# Patient Record
Sex: Female | Born: 1950 | Race: White | Hispanic: No | Smoking: Current some day smoker
Health system: Southern US, Community
[De-identification: ages and names within clinical notes are randomized; demographics above are authoritative.]

## PROBLEM LIST (undated history)

## (undated) DIAGNOSIS — Z1211 Encounter for screening for malignant neoplasm of colon: Secondary | ICD-10-CM

## (undated) DIAGNOSIS — E785 Hyperlipidemia, unspecified: Secondary | ICD-10-CM

## (undated) DIAGNOSIS — F329 Major depressive disorder, single episode, unspecified: Secondary | ICD-10-CM

## (undated) DIAGNOSIS — F419 Anxiety disorder, unspecified: Secondary | ICD-10-CM

## (undated) DIAGNOSIS — I10 Essential (primary) hypertension: Secondary | ICD-10-CM

## (undated) DIAGNOSIS — F32A Depression, unspecified: Secondary | ICD-10-CM

## (undated) DIAGNOSIS — K631 Perforation of intestine (nontraumatic): Secondary | ICD-10-CM

## (undated) HISTORY — DX: Perforation of intestine (nontraumatic): K63.1

## (undated) HISTORY — DX: Hyperlipidemia, unspecified: E78.5

## (undated) HISTORY — DX: Encounter for screening for malignant neoplasm of colon: Z12.11

## (undated) HISTORY — DX: Anxiety disorder, unspecified: F41.9

## (undated) HISTORY — PX: TUBAL LIGATION: SHX77

## (undated) HISTORY — PX: COLON SURGERY: SHX602

## (undated) HISTORY — DX: Essential (primary) hypertension: I10

---
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
## 2004-05-16 ENCOUNTER — Ambulatory Visit: Payer: Self-pay

## 2005-05-21 ENCOUNTER — Ambulatory Visit: Payer: Self-pay

## 2006-07-08 ENCOUNTER — Ambulatory Visit: Payer: Self-pay

## 2007-07-07 ENCOUNTER — Ambulatory Visit: Payer: Self-pay

## 2007-07-08 ENCOUNTER — Ambulatory Visit: Payer: Self-pay

## 2007-07-22 HISTORY — PX: BREAST BIOPSY: SHX20

## 2007-07-29 ENCOUNTER — Ambulatory Visit: Payer: Self-pay | Admitting: General Surgery

## 2008-07-08 ENCOUNTER — Emergency Department: Payer: Self-pay

## 2008-11-23 ENCOUNTER — Ambulatory Visit: Payer: Self-pay

## 2009-12-12 ENCOUNTER — Ambulatory Visit: Payer: Self-pay

## 2010-12-19 ENCOUNTER — Ambulatory Visit: Payer: Self-pay

## 2012-01-28 ENCOUNTER — Ambulatory Visit: Payer: Self-pay

## 2013-01-21 HISTORY — PX: BREAST BIOPSY: SHX20

## 2013-02-17 ENCOUNTER — Ambulatory Visit: Payer: Self-pay

## 2013-02-23 ENCOUNTER — Ambulatory Visit: Payer: Self-pay

## 2013-08-25 ENCOUNTER — Ambulatory Visit: Payer: Self-pay

## 2013-09-01 ENCOUNTER — Ambulatory Visit: Payer: Self-pay

## 2013-09-03 LAB — PATHOLOGY REPORT

## 2014-03-02 ENCOUNTER — Ambulatory Visit: Payer: Self-pay

## 2014-05-06 ENCOUNTER — Other Ambulatory Visit: Payer: Self-pay | Admitting: Oncology

## 2014-05-06 DIAGNOSIS — N63 Unspecified lump in unspecified breast: Secondary | ICD-10-CM

## 2014-05-06 DIAGNOSIS — N6320 Unspecified lump in the left breast, unspecified quadrant: Secondary | ICD-10-CM

## 2014-09-08 ENCOUNTER — Other Ambulatory Visit: Payer: Self-pay

## 2014-09-14 ENCOUNTER — Encounter: Payer: Self-pay | Admitting: *Deleted

## 2014-09-14 ENCOUNTER — Other Ambulatory Visit: Payer: Self-pay | Admitting: *Deleted

## 2014-09-14 ENCOUNTER — Ambulatory Visit
Admission: RE | Admit: 2014-09-14 | Discharge: 2014-09-14 | Disposition: A | Discharge status: Home / Self Care | Payer: Self-pay | Source: Ambulatory Visit | Attending: Oncology | Admitting: Oncology

## 2014-09-14 DIAGNOSIS — R922 Inconclusive mammogram: Secondary | ICD-10-CM

## 2014-09-14 DIAGNOSIS — N63 Unspecified lump in unspecified breast: Secondary | ICD-10-CM

## 2014-09-14 DIAGNOSIS — R928 Other abnormal and inconclusive findings on diagnostic imaging of breast: Secondary | ICD-10-CM | POA: Insufficient documentation

## 2014-09-14 DIAGNOSIS — N6320 Unspecified lump in the left breast, unspecified quadrant: Secondary | ICD-10-CM

## 2014-09-14 NOTE — Progress Notes (Signed)
Patient with birads 3 mammogram.  Called and mailed patient appointment for 6 month follow for 03/15/15 @ 12:30.

## 2015-03-15 ENCOUNTER — Ambulatory Visit: Payer: Self-pay

## 2015-04-12 ENCOUNTER — Ambulatory Visit
Admission: RE | Admit: 2015-04-12 | Discharge: 2015-04-12 | Disposition: A | Discharge status: Home / Self Care | Payer: Self-pay | Source: Ambulatory Visit | Attending: Oncology | Admitting: Oncology

## 2015-04-12 ENCOUNTER — Other Ambulatory Visit: Payer: Self-pay | Admitting: Oncology

## 2015-04-12 ENCOUNTER — Ambulatory Visit: Payer: Self-pay | Attending: Oncology

## 2015-04-12 VITALS — BP 130/86 | HR 92 | Temp 97.6°F | Ht 68.9 in | Wt 172.6 lb

## 2015-04-12 DIAGNOSIS — N63 Unspecified lump in unspecified breast: Secondary | ICD-10-CM

## 2015-04-12 DIAGNOSIS — Z0000 Encounter for general adult medical examination without abnormal findings: Secondary | ICD-10-CM

## 2015-04-12 NOTE — Progress Notes (Signed)
Subjective:     Patient ID: Marcia [name], female   DOB: [name], 65 y.o.   MRN: [id number]  HPI   Review of Systems     Objective:   Physical Exam  Pulmonary/Chest: Right breast exhibits no inverted nipple, no mass, no nipple discharge, no skin change and no tenderness. Left breast exhibits no inverted nipple, no mass, no nipple discharge, no skin change and no tenderness. Breasts are symmetrical.  Genitourinary: No labial fusion. There is no rash, tenderness, lesion or injury on the right labia. There is no rash, tenderness, lesion or injury on the left labia. Uterus is not deviated, not enlarged, not fixed and not tender. Cervix exhibits no motion tenderness, no discharge and no friability. Right adnexum displays no mass, no tenderness and no fullness. Left adnexum displays no mass, no tenderness and no fullness. No erythema, tenderness or bleeding in the vagina. No foreign body around the vagina. No signs of injury around the vagina. No vaginal discharge found.       Assessment:     65 [facility].  Patient screened, and meets [facility] eligibility.  Patient does not have insurance, Medicare or Medicaid.  Handout given on Affordable Care Act. Instructed patient on breast self-exam using teach back method.  CBE unremarkable.   Pelvic exam normal.     Plan:     Sent for bilateral diagnostic mammogram.  Specimen collected for pap.

## 2015-04-17 LAB — PAP LB AND HPV HIGH-RISK
HPV, high-risk: NEGATIVE
PAP SMEAR COMMENT: 0

## 2015-05-04 IMAGING — MG MM BREAST STEREO BIOPSY*L*
4 series · 8 of 8 positions shown · non-contrast
Comparison: Previous exams.

ADDENDUM:
Final pathology demonstrates FIBROCYSTIC AND FIBROADENOMATOID
CHANGES WITH MICROCALCIFICATIONS. SUGGESTION OF EARLY RADIAL SCAR.

Histology correlates with imaging findings.
The patient was contacted by phone on 09/03/2013 and these results
given to her which she understood. Her questions were answered.
The patient had no complaints with her biopsy site...
These results were discussed with Wayne Mazhar, RN.
Recommend surgical consultation for possible early radial scar.
CLINICAL DATA: Patient presents for stereotactic guided core biopsy
of left breast calcifications.
EXAM:
LEFT BREAST STEREOTACTIC CORE NEEDLE BIOPSY

[Series 1: L · left · 0.10mm/px · 5 of 15 slices shown]
[im 1/15]
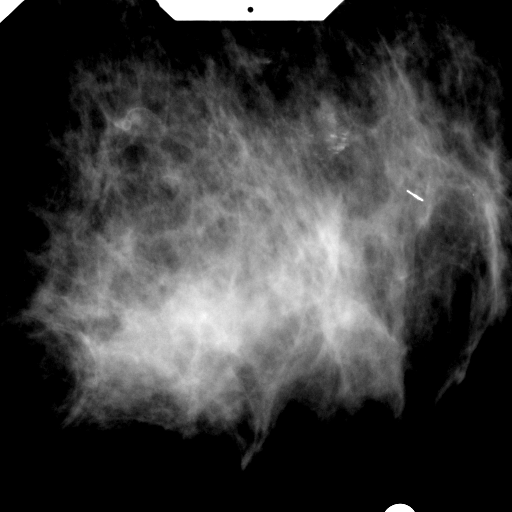
[im 4/15]
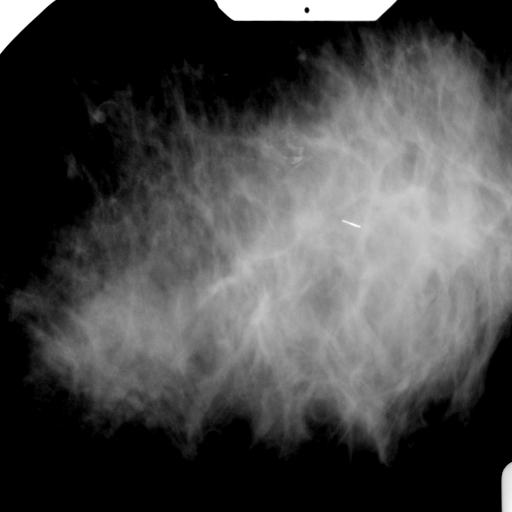
[im 8/15]
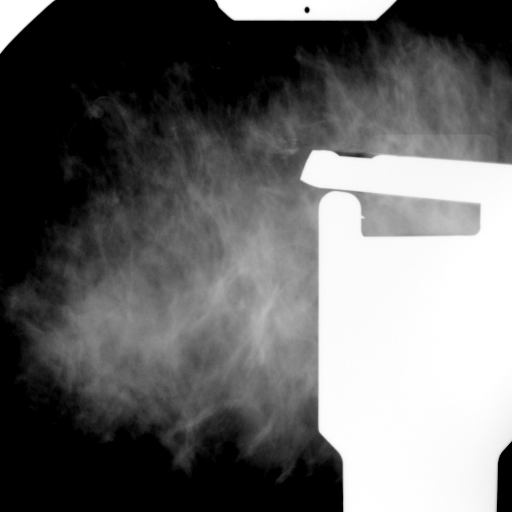
[im 11/15]
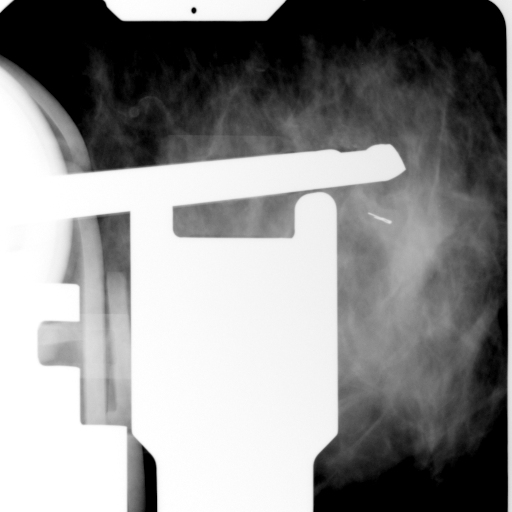
[im 15/15]
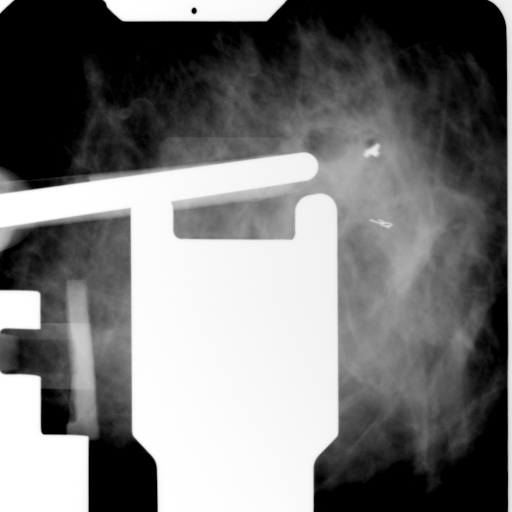

[L CC (1 of 2)]
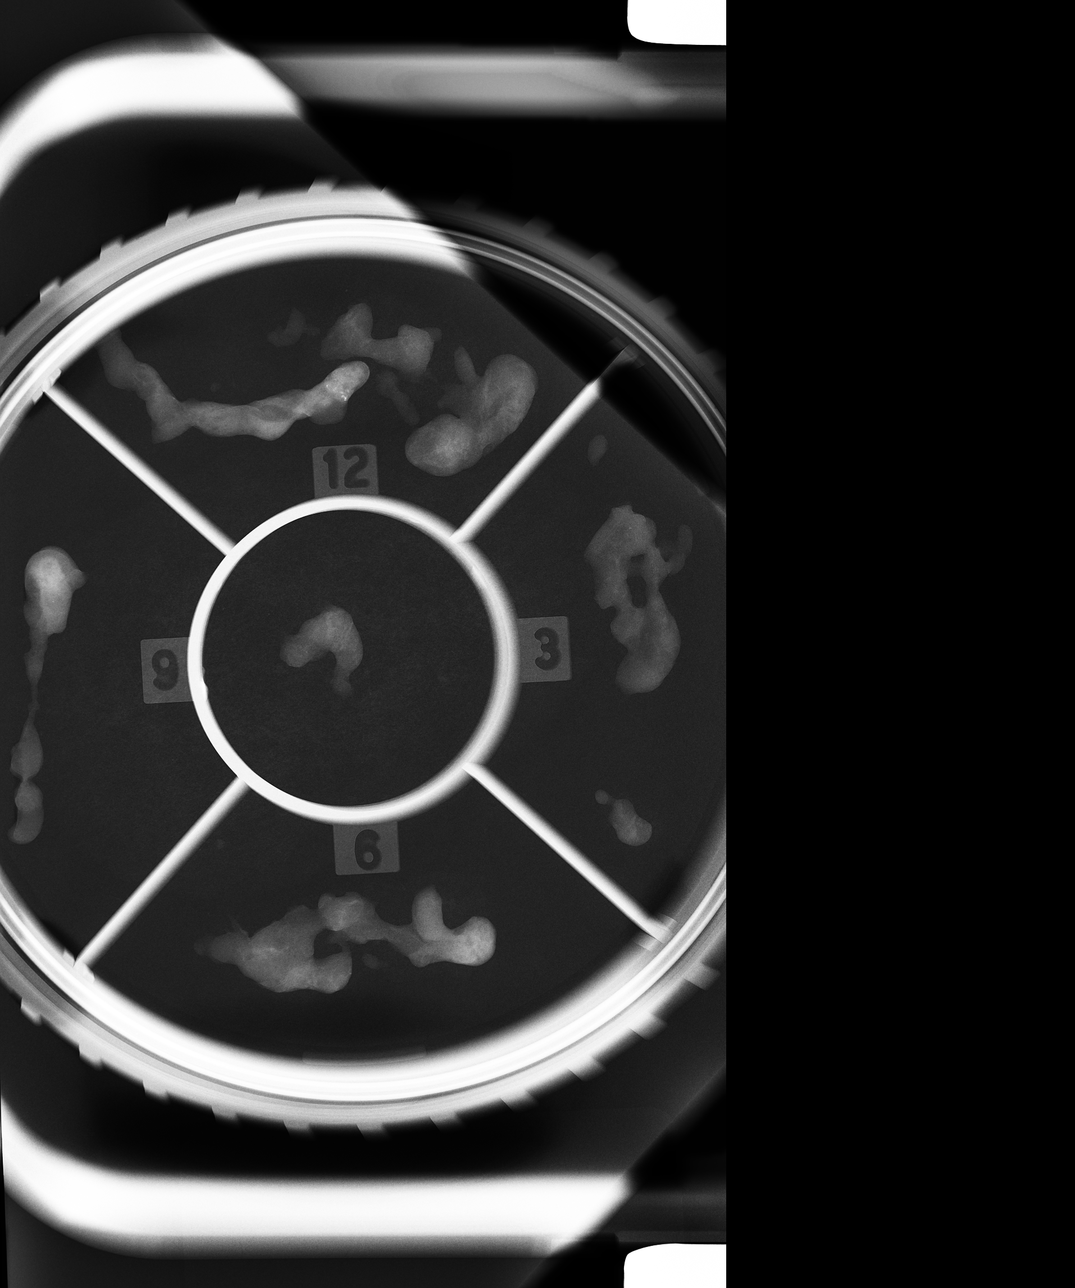

[L CC (2 of 2)]
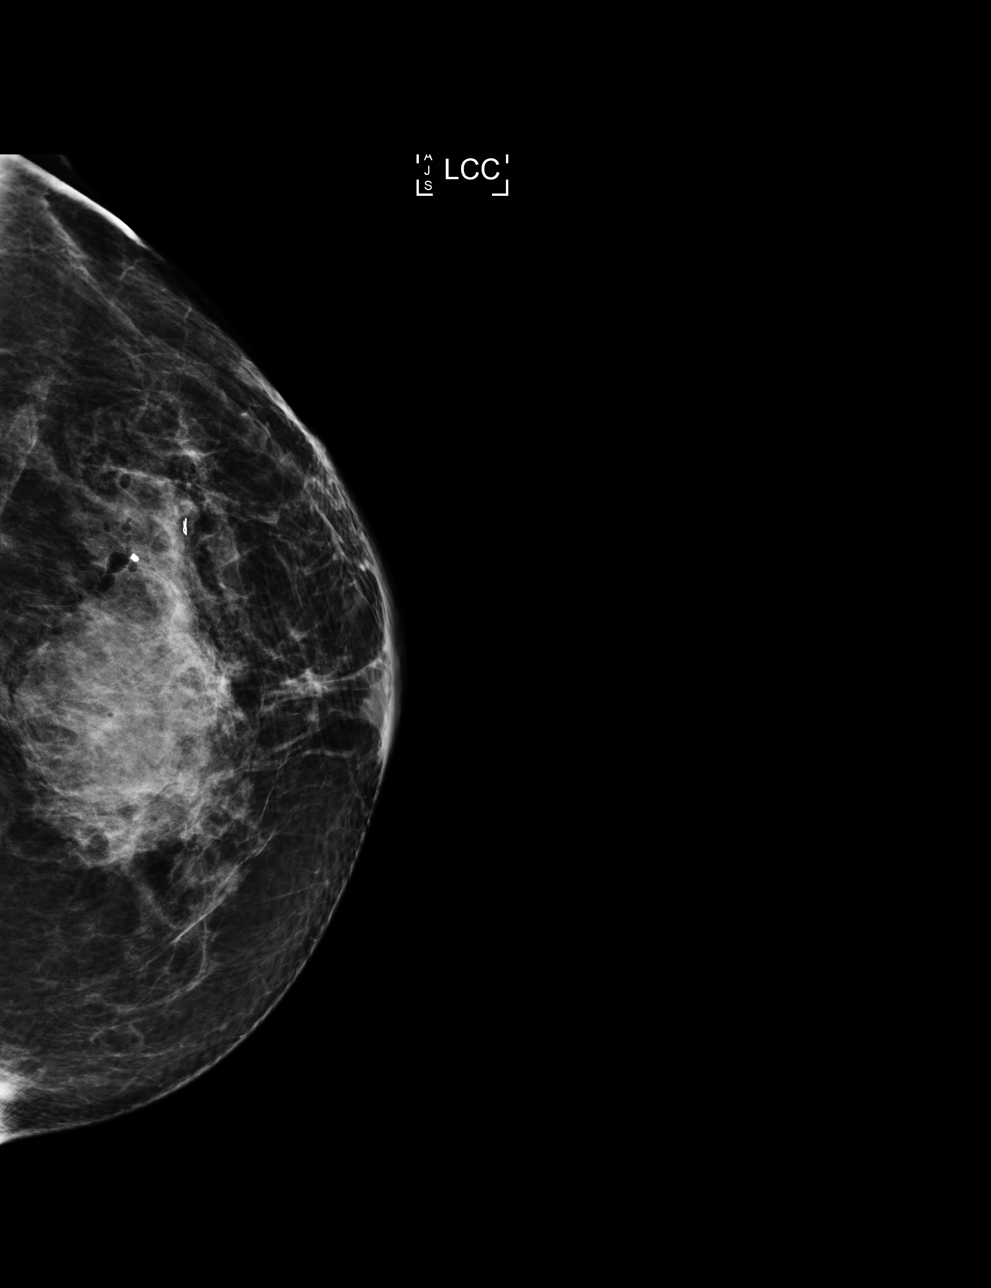

[L ML]
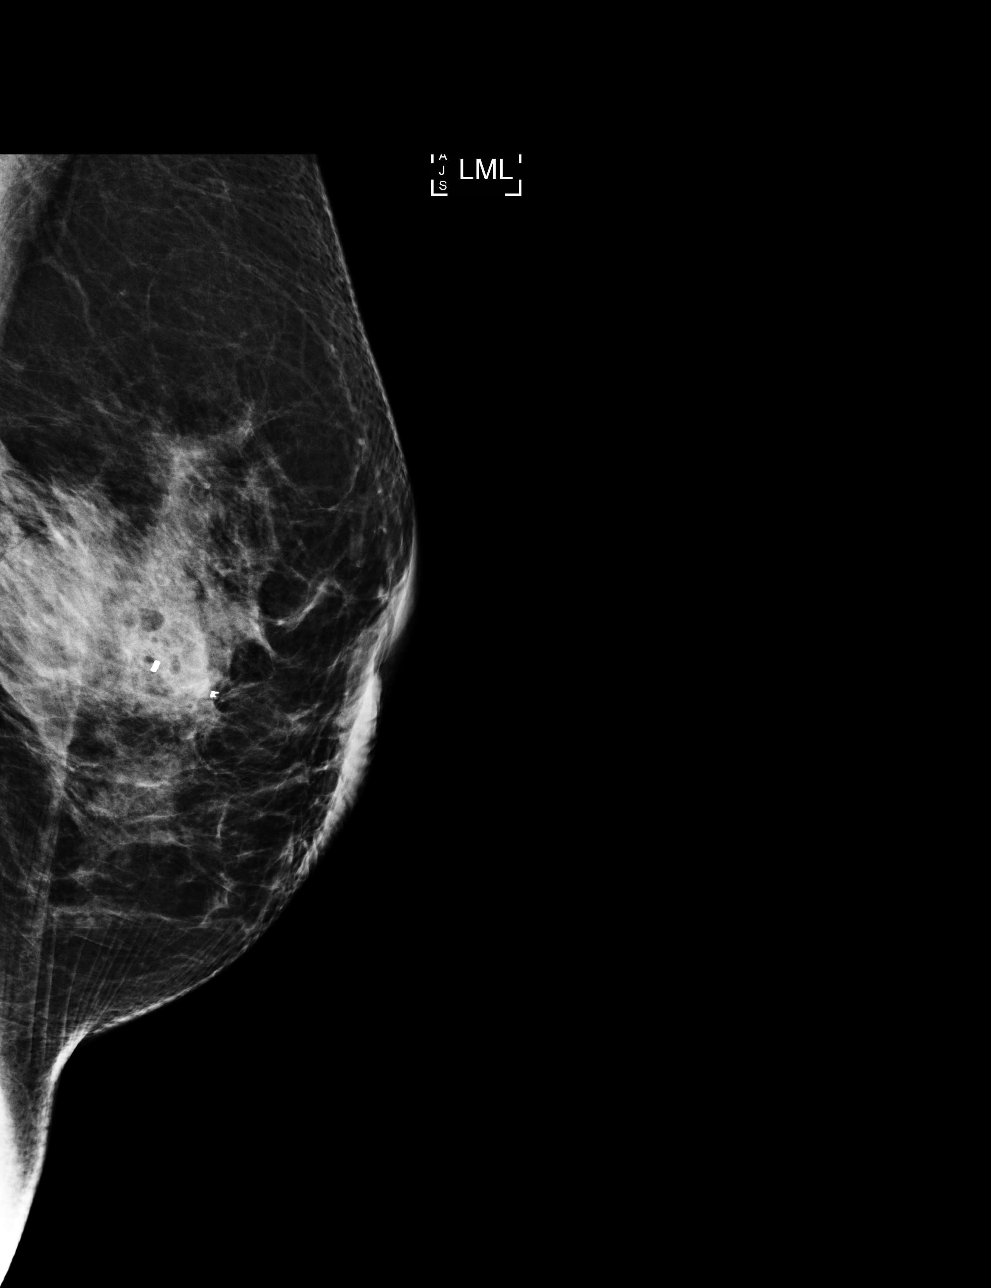

[8 of 8 positions shown; findings below may reference images not displayed]



Using sterile technique and 2% Lidocaine as local anesthetic, under
stereotactic guidance, a 9 gauge vacuum assisted device was used to
perform core needle biopsy of calcifications in the lateral portion
of the left breast using a lateral ladder approach. Specimen
radiograph was performed showing calcifications to be present.
Specimens with calcifications are identified for pathology.

At the conclusion of the procedure, a top hat shaped tissue marker
clip was deployed into the biopsy cavity. Follow-up 2-view mammogram
confirmed clip in the expected location. On post clip films, note is
made of small developing hematoma in the central portion of the
breast. A pressure dressing was applied prior to the patient leaving
the department. The patient was given post biopsy instructions.
IMPRESSION: Stereotactic-guided biopsy of left breast calcifications. Small
hematoma was noted following the biopsy.

## 2015-05-15 ENCOUNTER — Other Ambulatory Visit: Payer: Self-pay | Admitting: *Deleted

## 2015-05-15 ENCOUNTER — Telehealth: Payer: Self-pay | Admitting: *Deleted

## 2015-05-15 DIAGNOSIS — N63 Unspecified lump in unspecified breast: Secondary | ICD-10-CM

## 2015-05-15 NOTE — Telephone Encounter (Signed)
Patient called and wanted her pap smear results.  Informed of HPV negative, normal results.  Next pap due in 5 years.  Scheduled six month f/u mammogram for 10/11/15 @ 1:20.  Will follow-up per protocol.

## 2015-05-25 NOTE — Progress Notes (Signed)
Copy to [facility].  Next pap due in 5 years.

## 2015-10-11 ENCOUNTER — Ambulatory Visit: Payer: Self-pay

## 2015-11-10 ENCOUNTER — Ambulatory Visit
Admission: RE | Admit: 2015-11-10 | Discharge: 2015-11-10 | Disposition: A | Discharge status: Home / Self Care | Payer: Self-pay | Source: Ambulatory Visit | Attending: Oncology | Admitting: Oncology

## 2015-11-10 DIAGNOSIS — N63 Unspecified lump in unspecified breast: Secondary | ICD-10-CM

## 2016-07-17 ENCOUNTER — Encounter: Payer: Self-pay | Admitting: Family Medicine

## 2016-07-17 ENCOUNTER — Ambulatory Visit (INDEPENDENT_AMBULATORY_CARE_PROVIDER_SITE_OTHER): Payer: Medicare Other | Admitting: Family Medicine

## 2016-07-17 VITALS — BP 132/74 | HR 88 | Temp 97.8°F | Ht 67.0 in | Wt 156.0 lb

## 2016-07-17 DIAGNOSIS — R232 Flushing: Secondary | ICD-10-CM

## 2016-07-17 DIAGNOSIS — R928 Other abnormal and inconclusive findings on diagnostic imaging of breast: Secondary | ICD-10-CM

## 2016-07-17 DIAGNOSIS — Z7689 Persons encountering health services in other specified circumstances: Secondary | ICD-10-CM

## 2016-07-17 DIAGNOSIS — M24542 Contracture, left hand: Secondary | ICD-10-CM | POA: Diagnosis not present

## 2016-07-17 MED ORDER — HYDROXYZINE PAMOATE 25 MG PO CAPS: 25.0000 mg | ORAL_CAPSULE | Freq: Three times a day (TID) | ORAL | 3 refills | Status: DC | PRN

## 2016-07-17 MED ORDER — CLONIDINE HCL 0.1 MG PO TABS: 0.1000 mg | ORAL_TABLET | Freq: Every day | ORAL | 3 refills | Status: DC

## 2016-07-17 MED ORDER — CITALOPRAM HYDROBROMIDE 40 MG PO TABS: 40.0000 mg | ORAL_TABLET | Freq: Every day | ORAL | 3 refills | Status: DC

## 2016-07-17 MED ORDER — PRAVASTATIN SODIUM 40 MG PO TABS: 80.0000 mg | ORAL_TABLET | Freq: Every day | ORAL | 3 refills | Status: DC

## 2016-07-17 MED ORDER — HYDROCHLOROTHIAZIDE 25 MG PO TABS: 25.0000 mg | ORAL_TABLET | Freq: Every day | ORAL | 3 refills | Status: DC

## 2016-07-17 MED ORDER — GABAPENTIN 300 MG PO CAPS: 300.0000 mg | ORAL_CAPSULE | Freq: Every day | ORAL | 3 refills | Status: DC

## 2016-07-17 NOTE — Progress Notes (Signed)
BP 132/74   Pulse 88   Temp 97.8 F (36.6 C)   Ht 5\' 7"  (1.702 m)   Wt 156 lb (70.8 kg)   SpO2 94%   BMI 24.43 kg/m    Subjective:    Patient ID: Marcia [name], female    DOB: 02/03/1950, 66 y.o.   MRN: [id number]  HPI: Marcia [name] is a 66 y.o. female  Chief Complaint  Patient presents with  . Establish Care  . Finger    left hand pinky has been misshaped x 1 year. Occasionally painful if she hits her knuckle. No known injury.   Patient presents today to establish care. Has several things she would like to discuss today. Has scar tissue of right breast, gets monitored twice yearly and due for that now.   Also has a spot on back of neck that has been very itchy and isn't healing over the past week or so. No known injury or bites. Denies fever, chills, sweats, drainage from area. Has not been using anything on the place.   Has had a misshapen left 5th digit for about a year now, no longer able to straighten the finger. No known injuries to the hand. Has not tried anything for relief. Is left hand dominant so it does get in the way quite a bit.   Also having issues with hot flashes. Was given gabapentin for night hot flashes but not having a whole lot of relief with this.   Otherwise, chronic conditions are all stable and under good control. BPs controlled with HCTZ. Cholesterol controlled well with pravastatin, moods with celexa. Has not had basic labs in nearly a year.   Past Medical History:  Diagnosis Date  . Anxiety   . Hyperlipidemia   . Hypertension    Social History   Social History  . Marital status: Single    Spouse name: N/A  . Number of children: N/A  . Years of education: N/A   Occupational History  . Not on file.   Social History Main Topics  . Smoking status: Never Smoker  . Smokeless tobacco: Former NeurosurgeonUser    Quit date: 07/01/2016  . Alcohol use No  . Drug use: No  . Sexual activity: Not on file   Other Topics Concern  . Not on file    Social History Narrative  . No narrative on file   Relevant past medical, surgical, family and social history reviewed and updated as indicated. Interim medical history since our last visit reviewed. Allergies and medications reviewed and updated.  Review of Systems  Constitutional: Negative.   HENT: Negative.   Respiratory: Negative.   Cardiovascular: Negative.   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   Skin: Positive for wound.  Neurological: Negative.   Psychiatric/Behavioral: Negative.    Per HPI unless specifically indicated above     Objective:    BP 132/74   Pulse 88   Temp 97.8 F (36.6 C)   Ht 5\' 7"  (1.702 m)   Wt 156 lb (70.8 kg)   SpO2 94%   BMI 24.43 kg/m   Wt Readings from Last 3 Encounters:  07/17/16 156 lb (70.8 kg)  04/12/15 172 lb 9.9 oz (78.3 kg)    Physical Exam  Constitutional: She is oriented to person, place, and time. She appears well-developed and well-nourished. No distress.  HENT:  Head: Atraumatic.  Eyes: Conjunctivae are normal. Pupils are equal, round, and reactive to light.  Neck: Normal range of motion. Neck supple.  Cardiovascular: Normal rate, regular rhythm and normal heart sounds.   Pulmonary/Chest: Effort normal and breath sounds normal. No respiratory distress.  Abdominal: Soft. Bowel sounds are normal. There is no tenderness.  Musculoskeletal: Normal range of motion. She exhibits deformity (Left 5th digit contracture at PIP, unable to straighten). She exhibits no tenderness.  Neurological: She is alert and oriented to person, place, and time.  Skin: Skin is warm and dry.  Small eroded papule resembling a bug bite that has been scratched persistently. Healing well, no signs of infection  Psychiatric: She has a normal mood and affect. Her behavior is normal.  Nursing note and vitals reviewed.     Assessment & Plan:   Problem List  Items Addressed This Visit    None    Visit Diagnoses    Encounter to establish care    -  Primary   Will set up a CPE in about 3 months to get caught up on blood work and health maintenance.    Contracture of finger joint, left       Unable to brace finger as it will not straighten at this point. Will refer to orthopedics for further management   Relevant Orders   AMB referral to orthopedics   Abnormal mammogram       Will order her routine 6 month mammogram. Await results   Relevant Orders   MM Digital Diagnostic Bilat   Hot flashes       Add clonidine for daytime symptom relief. Monitor BPs carefully in the meantime. Script given for BP machine. Instructed to call with persistent abnormal BPs   Relevant Medications   pravastatin (PRAVACHOL) 40 MG tablet   hydrochlorothiazide (HYDRODIURIL) 25 MG tablet   cloNIDine (CATAPRES) 0.1 MG tablet      Follow up plan: Return in about 3 months (around 10/17/2016) for Welcome to Medicare, CPE.

## 2016-07-18 ENCOUNTER — Ambulatory Visit: Payer: Self-pay | Admitting: Family Medicine

## 2016-07-19 ENCOUNTER — Telehealth: Payer: Self-pay | Admitting: Family Medicine

## 2016-07-19 ENCOUNTER — Encounter: Payer: Self-pay | Admitting: Family Medicine

## 2016-07-19 NOTE — Telephone Encounter (Signed)
Patient called in regards to a medication her and provider talked about during last visit. Patient stated  She was suppose to receive a medication for her BP.  Please Advise.  Thank you

## 2016-07-19 NOTE — Telephone Encounter (Signed)
Spoke with patient, she states the pharmacy didn't get the clonidine rx. [facility]

## 2016-07-19 NOTE — Telephone Encounter (Signed)
I only see the rx for Clonidine. Is she to take that for her hot flashes and BP?

## 2016-07-19 NOTE — Telephone Encounter (Signed)
Clonidine is for hot flashes, the HCTZ is for her BP

## 2016-07-31 DIAGNOSIS — M20022 Boutonniere deformity of left finger(s): Secondary | ICD-10-CM | POA: Diagnosis not present

## 2016-07-31 DIAGNOSIS — M20029 Boutonniere deformity of unspecified finger(s): Secondary | ICD-10-CM | POA: Insufficient documentation

## 2016-08-01 DIAGNOSIS — M720 Palmar fascial fibromatosis [Dupuytren]: Secondary | ICD-10-CM | POA: Diagnosis not present

## 2016-09-03 DIAGNOSIS — M720 Palmar fascial fibromatosis [Dupuytren]: Secondary | ICD-10-CM | POA: Diagnosis not present

## 2016-09-05 DIAGNOSIS — M720 Palmar fascial fibromatosis [Dupuytren]: Secondary | ICD-10-CM | POA: Diagnosis not present

## 2016-09-17 ENCOUNTER — Telehealth: Payer: Self-pay | Admitting: Family Medicine

## 2016-09-17 ENCOUNTER — Other Ambulatory Visit: Payer: Self-pay | Admitting: Family Medicine

## 2016-09-17 MED ORDER — PRAVASTATIN SODIUM 40 MG PO TABS: 40.0000 mg | ORAL_TABLET | Freq: Every day | ORAL | 1 refills | Status: DC

## 2016-09-17 NOTE — Telephone Encounter (Signed)
I spoke with patient and she says that when she filled it at [facility], they changed the rx from 40mg  twice a day to 80mg  once a day so that her insurance would cover it. So she is currently taking 80mg  once a day and states she has enough to cover her until she comes in for her physical and we recheck her labs.

## 2016-09-17 NOTE — Telephone Encounter (Signed)
Please call and let her know I received a denial for her cholesterol medication from the insurance company as it was written for 2 tablets daily and they will only cover one. I wrote a new script for 40 mg tablet daily, we will check her cholesterol at her upcoming follow up and make a decision from there about how to proceed.

## 2016-09-17 NOTE — Telephone Encounter (Signed)
Left message to call.

## 2016-09-17 NOTE — Telephone Encounter (Signed)
Perfect, thanks 

## 2016-10-08 DIAGNOSIS — M720 Palmar fascial fibromatosis [Dupuytren]: Secondary | ICD-10-CM | POA: Diagnosis not present

## 2016-10-08 DIAGNOSIS — L282 Other prurigo: Secondary | ICD-10-CM | POA: Diagnosis not present

## 2016-10-16 ENCOUNTER — Telehealth: Payer: Self-pay | Admitting: Family Medicine

## 2016-10-16 NOTE — Telephone Encounter (Signed)
Called pt to [employer] Visit with Nurse Health Advisor, [place], to [place] my c/b # is [phone]  Manuela Schwartz

## 2016-11-06 ENCOUNTER — Ambulatory Visit: Payer: Medicare Other

## 2016-11-08 ENCOUNTER — Other Ambulatory Visit: Payer: Self-pay | Admitting: Family Medicine

## 2016-11-08 MED ORDER — CLONIDINE HCL 0.1 MG PO TABS: 0.1000 mg | ORAL_TABLET | Freq: Every day | ORAL | 3 refills | Status: DC

## 2016-11-08 NOTE — Telephone Encounter (Signed)
Patient needs a refill on the clonitine for her hot flashes [facility]  Thanks

## 2016-11-08 NOTE — Telephone Encounter (Signed)
Routing to provider. Appt on 11/13/16.

## 2016-11-13 ENCOUNTER — Ambulatory Visit (INDEPENDENT_AMBULATORY_CARE_PROVIDER_SITE_OTHER): Payer: Medicare Other | Admitting: Family Medicine

## 2016-11-13 ENCOUNTER — Encounter: Payer: Self-pay | Admitting: Family Medicine

## 2016-11-13 VITALS — BP 106/68 | HR 84 | Temp 98.6°F | Ht 67.5 in | Wt 149.4 lb

## 2016-11-13 DIAGNOSIS — Z23 Encounter for immunization: Secondary | ICD-10-CM | POA: Diagnosis not present

## 2016-11-13 DIAGNOSIS — Z0000 Encounter for general adult medical examination without abnormal findings: Secondary | ICD-10-CM | POA: Diagnosis not present

## 2016-11-13 DIAGNOSIS — I10 Essential (primary) hypertension: Secondary | ICD-10-CM | POA: Diagnosis not present

## 2016-11-13 DIAGNOSIS — E782 Mixed hyperlipidemia: Secondary | ICD-10-CM

## 2016-11-13 DIAGNOSIS — E785 Hyperlipidemia, unspecified: Secondary | ICD-10-CM | POA: Insufficient documentation

## 2016-11-13 DIAGNOSIS — Z1239 Encounter for other screening for malignant neoplasm of breast: Secondary | ICD-10-CM

## 2016-11-13 DIAGNOSIS — E1159 Type 2 diabetes mellitus with other circulatory complications: Secondary | ICD-10-CM | POA: Insufficient documentation

## 2016-11-13 DIAGNOSIS — E1169 Type 2 diabetes mellitus with other specified complication: Secondary | ICD-10-CM | POA: Insufficient documentation

## 2016-11-13 MED ORDER — HYDROCHLOROTHIAZIDE 25 MG PO TABS: 25.0000 mg | ORAL_TABLET | Freq: Every day | ORAL | 3 refills | Status: DC

## 2016-11-13 MED ORDER — CITALOPRAM HYDROBROMIDE 40 MG PO TABS: 40.0000 mg | ORAL_TABLET | Freq: Every day | ORAL | 3 refills | Status: DC

## 2016-11-13 MED ORDER — PRAVASTATIN SODIUM 40 MG PO TABS: 40.0000 mg | ORAL_TABLET | Freq: Every day | ORAL | 1 refills | Status: DC

## 2016-11-13 MED ORDER — HYDROXYZINE PAMOATE 25 MG PO CAPS: 25.0000 mg | ORAL_CAPSULE | Freq: Three times a day (TID) | ORAL | 3 refills | Status: DC | PRN

## 2016-11-13 MED ORDER — GABAPENTIN 300 MG PO CAPS: 600.0000 mg | ORAL_CAPSULE | Freq: Every day | ORAL | 3 refills | Status: DC

## 2016-11-13 MED ORDER — TRIAMCINOLONE ACETONIDE 0.1 % EX CREA: 1.0000 "application " | TOPICAL_CREAM | Freq: Two times a day (BID) | CUTANEOUS | 0 refills | Status: DC

## 2016-11-13 MED ORDER — CLONIDINE HCL 0.1 MG PO TABS: 0.1000 mg | ORAL_TABLET | Freq: Every day | ORAL | 3 refills | Status: DC

## 2016-11-13 NOTE — Progress Notes (Signed)
Subjective:    Marcia [name] is a 66 y.o. female who presents for a Welcome to Medicare exam.   Review of Systems Review of Systems  Constitutional: Negative.   HENT: Negative.   Eyes: Negative.   Respiratory: Negative.   Cardiovascular: Negative.   Gastrointestinal: Negative.   Genitourinary: Negative.   Musculoskeletal: Negative.   Skin: Negative.   Neurological: Negative.   Psychiatric/Behavioral: Negative.    Cardiac Risk Factors include: advanced age (>11men, >21 women);hypertension;dyslipidemia      Objective:    Today's Vitals   11/13/16 1315  BP: 106/68  Pulse: 84  Temp: 98.6 F (37 C)  SpO2: 96%  Weight: 149 lb 6.4 oz (67.8 kg)  Height: 5' 7.5" (1.715 m)  Body mass index is 23.05 kg/m.  Medications Outpatient Encounter Prescriptions as of 11/13/2016  Medication Sig  . citalopram (CELEXA) 40 MG tablet Take 1 tablet (40 mg total) by mouth daily.  . cloNIDine (CATAPRES) 0.1 MG tablet Take 1 tablet (0.1 mg total) by mouth daily.  [name] gabapentin (NEURONTIN) 300 MG capsule Take 2 capsules (600 mg total) by mouth at bedtime.  . hydrochlorothiazide (HYDRODIURIL) 25 MG tablet Take 1 tablet (25 mg total) by mouth daily.  . hydrOXYzine (VISTARIL) 25 MG capsule Take 1 capsule (25 mg total) by mouth 3 (three) times daily as needed.  . pravastatin (PRAVACHOL) 40 MG tablet Take 1 tablet (40 mg total) by mouth daily.  . [DISCONTINUED] citalopram (CELEXA) 40 MG tablet Take 1 tablet (40 mg total) by mouth daily.  . [DISCONTINUED] cloNIDine (CATAPRES) 0.1 MG tablet Take 1 tablet (0.1 mg total) by mouth daily.  . [DISCONTINUED] gabapentin (NEURONTIN) 300 MG capsule Take 1 capsule (300 mg total) by mouth at bedtime.  . [DISCONTINUED] hydrochlorothiazide (HYDRODIURIL) 25 MG tablet Take 1 tablet (25 mg total) by mouth daily.  . [DISCONTINUED] hydrOXYzine (VISTARIL) 25 MG capsule Take 1 capsule (25 mg total) by mouth 3 (three) times daily as needed.  . [DISCONTINUED] pravastatin  (PRAVACHOL) 40 MG tablet Take 1 tablet (40 mg total) by mouth daily.  [name] triamcinolone cream (KENALOG) 0.1 % Apply 1 application topically 2 (two) times daily.   No facility-administered encounter medications on file as of 11/13/2016.      History: Past Medical History:  Diagnosis Date  . Anxiety   . Hyperlipidemia   . Hypertension    Past Surgical History:  Procedure Laterality Date  . BREAST BIOPSY Left 2015   CORE W/CLIP - NEG  . BREAST BIOPSY Left 07/2007   neg bx   . CESAREAN SECTION    . TUBAL LIGATION      Family History  Problem Relation Age of Onset  . Breast cancer Mother   . Liver cancer Sister   . Pancreatic cancer Sister   . Stomach cancer Brother   . Lung cancer Brother   . Brain cancer Sister   . Diabetes Father   . Heart disease Father   . COPD Neg Hx   . Stroke Neg Hx    Social History   Occupational History  . Not on file.   Social History Main Topics  . Smoking status: Never Smoker  . Smokeless tobacco: Former Neurosurgeon    Quit date: 07/01/2016  . Alcohol use No  . Drug use: No  . Sexual activity: Not on file    Tobacco Counseling Counseling given: Not Answered   Immunizations and Health Maintenance Immunization History  Administered Date(s) Administered  . Influenza, High  Dose Seasonal PF 11/13/2016  . Pneumococcal Conjugate-13 11/13/2016  . Tdap 11/17/2013   Health Maintenance Due  Topic Date Due  . MAMMOGRAM  03/02/2016  . DEXA SCAN  03/22/2016    Activities of Daily Living In your present state of health, do you have any difficulty performing the following activities: 11/13/2016  Hearing? N  Vision? N  Difficulty concentrating or making decisions? N  Walking or climbing stairs? N  Dressing or bathing? N  Doing errands, shopping? N  Preparing Food and eating ? N  Using the Toilet? N  In the past six months, have you accidently leaked urine? N  Do you have problems with loss of bowel control? N  Managing your Medications? N    Managing your Finances? N  Housekeeping or managing your Housekeeping? N  Some recent data might be hidden     Physical Exam  Physical Exam  Constitutional: She is oriented to person, place, and time and well-developed, well-nourished, and in no distress.  HENT:  Head: Atraumatic.  Eyes: Pupils are equal, round, and reactive to light. Conjunctivae are normal. No scleral icterus.  Neck: Normal range of motion. Neck supple.  Cardiovascular: Normal rate and normal heart sounds.   Pulmonary/Chest: Effort normal and breath sounds normal. No respiratory distress.  Abdominal: Soft. Bowel sounds are normal. She exhibits no distension. There is no tenderness.  Musculoskeletal: Normal range of motion.  Neurological: She is alert and oriented to person, place, and time. No cranial nerve deficit.  Skin: Skin is warm and dry.  Psychiatric: Memory, affect and judgment normal.  Nursing note and vitals reviewed. (optional), or other factors deemed appropriate based on the beneficiary's medical and social history and current clinical standards.  Advanced Directives: Does Patient Have a Medical Advance Directive?: Yes Does patient want to make changes to medical advance directive?: Yes (MAU/Ambulatory/Procedural Areas - Information given)    Assessment:    This is a routine wellness examination for this patient .  Vision/Hearing screen  Hearing Screening   125Hz  250Hz  500Hz  1000Hz  2000Hz  3000Hz  4000Hz  6000Hz  8000Hz   Right ear:   0 0 25  0    Left ear:   0 0 25  0      Visual Acuity Screening   Right eye Left eye Both eyes  Without correction: 20/50 20/50 20/30   With correction:       Dietary issues and exercise activities discussed:  Current Exercise Habits: Home exercise routine, Type of exercise: walking, Time (Minutes): 20, Frequency (Times/Week): 7, Weekly Exercise (Minutes/Week): 140, Intensity: Mild, Exercise limited by: None identified  Goals    . Increase water intake           Recommend drinking at least 4-5 glasses of water a day       Depression Screen PHQ 2/9 Scores 07/17/2016  PHQ - 2 Score 0     Fall Risk Fall Risk  07/17/2016  Falls in the past year? Yes  Number falls in past yr: 1  Injury with Fall? Yes    Cognitive Function:      6CIT Screen 11/13/2016  What Year? 0 points  What month? 0 points  What time? 0 points  Count back from 20 0 points  Months in reverse 0 points  Repeat phrase 0 points  Total Score 0    Patient Care Team: Particia Nearing, PA-C as PCP - General (Family Medicine)     Plan:     I have personally reviewed and  noted the following in the patient's chart:   . Medical and social history . Use of alcohol, tobacco or illicit drugs  . Current medications and supplements . Functional ability and status . Nutritional status . Physical activity . Advanced directives . List of other physicians . Hospitalizations, surgeries, and ER visits in previous 12 months . Vitals . Screenings to include cognitive, depression, and falls . Referrals and appointments  In addition, I have reviewed and discussed with patient certain preventive protocols, quality metrics, and best practice recommendations. A written personalized care plan for preventive services as well as general preventive health recommendations were provided to patient.     Particia Nearing, [place] 11/16/2016     Subjective:   Marcia [name] is a 66 y.o. female who presents for an Initial Medicare Annual Wellness Visit.  Review of Systems      Cardiac Risk Factors include: advanced age (>21men, >23 women);hypertension;dyslipidemia     Objective:    Today's Vitals   11/13/16 1315  BP: 106/68  Pulse: 84  Temp: 98.6 F (37 C)  SpO2: 96%  Weight: 149 lb 6.4 oz (67.8 kg)  Height: 5' 7.5" (1.715 m)   Body mass index is 23.05 kg/m.   Current Medications (verified) Outpatient Encounter Prescriptions as of 11/13/2016  Medication Sig   . citalopram (CELEXA) 40 MG tablet Take 1 tablet (40 mg total) by mouth daily.  . cloNIDine (CATAPRES) 0.1 MG tablet Take 1 tablet (0.1 mg total) by mouth daily.  [name] gabapentin (NEURONTIN) 300 MG capsule Take 2 capsules (600 mg total) by mouth at bedtime.  . hydrochlorothiazide (HYDRODIURIL) 25 MG tablet Take 1 tablet (25 mg total) by mouth daily.  . hydrOXYzine (VISTARIL) 25 MG capsule Take 1 capsule (25 mg total) by mouth 3 (three) times daily as needed.  . pravastatin (PRAVACHOL) 40 MG tablet Take 1 tablet (40 mg total) by mouth daily.  . [DISCONTINUED] citalopram (CELEXA) 40 MG tablet Take 1 tablet (40 mg total) by mouth daily.  . [DISCONTINUED] cloNIDine (CATAPRES) 0.1 MG tablet Take 1 tablet (0.1 mg total) by mouth daily.  . [DISCONTINUED] gabapentin (NEURONTIN) 300 MG capsule Take 1 capsule (300 mg total) by mouth at bedtime.  . [DISCONTINUED] hydrochlorothiazide (HYDRODIURIL) 25 MG tablet Take 1 tablet (25 mg total) by mouth daily.  . [DISCONTINUED] hydrOXYzine (VISTARIL) 25 MG capsule Take 1 capsule (25 mg total) by mouth 3 (three) times daily as needed.  . [DISCONTINUED] pravastatin (PRAVACHOL) 40 MG tablet Take 1 tablet (40 mg total) by mouth daily.  [name] triamcinolone cream (KENALOG) 0.1 % Apply 1 application topically 2 (two) times daily.   No facility-administered encounter medications on file as of 11/13/2016.     Allergies (verified) Patient has no known allergies.   History: Past Medical History:  Diagnosis Date  . Anxiety   . Hyperlipidemia   . Hypertension    Past Surgical History:  Procedure Laterality Date  . BREAST BIOPSY Left 2015   CORE W/CLIP - NEG  . BREAST BIOPSY Left 07/2007   neg bx   . CESAREAN SECTION    . TUBAL LIGATION     Family History  Problem Relation Age of Onset  . Breast cancer Mother   . Liver cancer Sister   . Pancreatic cancer Sister   . Stomach cancer Brother   . Lung cancer Brother   . Brain cancer Sister   . Diabetes Father   .  Heart disease Father   . COPD Neg  Hx   . Stroke Neg Hx    Social History   Occupational History  . Not on file.   Social History Main Topics  . Smoking status: Never Smoker  . Smokeless tobacco: Former Neurosurgeon    Quit date: 07/01/2016  . Alcohol use No  . Drug use: No  . Sexual activity: Not on file    Tobacco Counseling Counseling given: Not Answered   Activities of Daily Living In your present state of health, do you have any difficulty performing the following activities: 11/13/2016  Hearing? N  Vision? N  Difficulty concentrating or making decisions? N  Walking or climbing stairs? N  Dressing or bathing? N  Doing errands, shopping? N  Preparing Food and eating ? N  Using the Toilet? N  In the past six months, have you accidently leaked urine? N  Do you have problems with loss of bowel control? N  Managing your Medications? N  Managing your Finances? N  Housekeeping or managing your Housekeeping? N  Some recent data might be hidden    Immunizations and Health Maintenance Immunization History  Administered Date(s) Administered  . Influenza, High Dose Seasonal PF 11/13/2016  . Pneumococcal Conjugate-13 11/13/2016  . Tdap 11/17/2013   Health Maintenance Due  Topic Date Due  . MAMMOGRAM  03/02/2016  . DEXA SCAN  03/22/2016    Patient Care Team: Particia Nearing, PA-C as PCP - General (Family Medicine)  Indicate any recent Medical Services you may have received from other than [facility] providers in the past year (date may be approximate).     Assessment:   This is a routine wellness examination for Marcia [name].   Hearing/Vision screen  Hearing Screening   125Hz  250Hz  500Hz  1000Hz  2000Hz  3000Hz  4000Hz  6000Hz  8000Hz   Right ear:   0 0 25  0    Left ear:   0 0 25  0      Visual Acuity Screening   Right eye Left eye Both eyes  Without correction: 20/50 20/50 20/30   With correction:       Dietary issues and exercise activities discussed: Current Exercise  Habits: Home exercise routine, Type of exercise: walking, Time (Minutes): 20, Frequency (Times/Week): 7, Weekly Exercise (Minutes/Week): 140, Intensity: Mild, Exercise limited by: None identified  Goals    . Increase water intake          Recommend drinking at least 4-5 glasses of water a day       Depression Screen PHQ 2/9 Scores 07/17/2016  PHQ - 2 Score 0    Fall Risk Fall Risk  07/17/2016  Falls in the past year? Yes  Number falls in past yr: 1  Injury with Fall? Yes    Cognitive Function:     6CIT Screen 11/13/2016  What Year? 0 points  What month? 0 points  What time? 0 points  Count back from 20 0 points  Months in reverse 0 points  Repeat phrase 0 points  Total Score 0    Screening Tests Health Maintenance  Topic Date Due  . MAMMOGRAM  03/02/2016  . DEXA SCAN  03/22/2016  . COLONOSCOPY  07/17/2017 (Originally 03/22/2001)  . Hepatitis C Screening  07/17/2017 (Originally 05/25/1950)  . HIV Screening  07/17/2017 (Originally 03/23/1965)  . PNA vac Low Risk Adult (2 of 2 - PPSV23) 11/13/2017  . PAP SMEAR  04/11/2018  . TETANUS/TDAP  11/18/2023  . INFLUENZA VACCINE  Completed      Plan:    1. Welcome  to Medicare preventive visit EKG NSR with normal HR, no acute ST or T wave changes. UTD on health maintenance. Mammogram ordered - EKG 12-Lead  2. Encounter for Medicare annual wellness exam   3. Need for pneumococcal vaccination  - Pneumococcal conjugate vaccine 13-valent  4. Need for influenza vaccination  - Flu vaccine HIGH DOSE PF (Fluzone High dose)  5. Essential hypertension Stable and WNL, continue current regimen. Await metabolic panel - CBC with Differential/Platelet  6. Mixed hyperlipidemia Continue current regimen, await fasting lipid panel - Comprehensive metabolic panel - Lipid Panel w/o Chol/HDL Ratio  7. Screening for breast cancer Mammogram ordered. Continue monthly self breast exams.   I have personally reviewed and noted the  following in the patient's chart:   . Medical and social history . Use of alcohol, tobacco or illicit drugs  . Current medications and supplements . Functional ability and status . Nutritional status . Physical activity . Advanced directives . List of other physicians . Hospitalizations, surgeries, and ER visits in previous 12 months . Vitals . Screenings to include cognitive, depression, and falls . Referrals and appointments  In addition, I have reviewed and discussed with patient certain preventive protocols, quality metrics, and best practice recommendations. A written personalized care plan for preventive services as well as general preventive health recommendations were provided to patient.     Particia NearingRachel Elizabeth Verdie Barrows, [place]-C   11/16/2016

## 2016-11-13 NOTE — Patient Instructions (Addendum)
Fat and Cholesterol Restricted Diet High levels of fat and cholesterol in your blood may lead to various health problems, such as diseases of the heart, blood vessels, gallbladder, liver, and pancreas. Fats are concentrated sources of energy that come in various forms. Certain types of fat, including saturated fat, may be harmful in excess. Cholesterol is a substance needed by your body in small amounts. Your body makes all the cholesterol it needs. Excess cholesterol comes from the food you eat. When you have high levels of cholesterol and saturated fat in your blood, health problems can develop because the excess fat and cholesterol will gather along the walls of your blood vessels, causing them to narrow. Choosing the right foods will help you control your intake of fat and cholesterol. This will help keep the levels of these substances in your blood within normal limits and reduce your risk of disease. What is my plan? Your health care provider recommends that you:  Limit your fat intake to ______% or less of your total calories per day.  Limit the amount of cholesterol in your diet to less than _________mg per day.  Eat 20-30 grams of fiber each day.  What types of fat should I choose?  Choose healthy fats more often. Choose monounsaturated and polyunsaturated fats, such as olive and canola oil, flaxseeds, walnuts, almonds, and seeds.  Eat more omega-3 fats. Good choices include salmon, mackerel, sardines, tuna, flaxseed oil, and ground flaxseeds. Aim to eat fish at least two times a week.  Limit saturated fats. Saturated fats are primarily found in animal products, such as meats, butter, and cream. Plant sources of saturated fats include palm oil, palm kernel oil, and coconut oil.  Avoid foods with partially hydrogenated oils in them. These contain trans fats. Examples of foods that contain trans fats are stick margarine, some tub margarines, cookies, crackers, and other baked  goods. What general guidelines do I need to follow? These guidelines for healthy eating will help you control your intake of fat and cholesterol:  Check food labels carefully to identify foods with trans fats or high amounts of saturated fat.  Fill one half of your plate with vegetables and green salads.  Fill one fourth of your plate with whole grains. Look for the word "whole" as the first word in the ingredient list.  Fill one fourth of your plate with lean protein foods.  Limit fruit to two servings a day. Choose fruit instead of juice.  Eat more foods that contain fiber, such as apples, broccoli, carrots, beans, peas, and barley.  Eat more home-cooked food and less restaurant, buffet, and fast food.  Limit or avoid alcohol.  Limit foods high in starch and sugar.  Limit fried foods.  Cook foods using methods other than frying. Baking, boiling, grilling, and broiling are all great options.  Lose weight if you are overweight. Losing just 5-10% of your initial body weight can help your overall health and prevent diseases such as diabetes and heart disease.  What foods can I eat? Grains  Whole grains, such as whole wheat or whole grain breads, crackers, cereals, and pasta. Unsweetened oatmeal, bulgur, barley, quinoa, or brown rice. Corn or whole wheat flour tortillas. Vegetables  Fresh or frozen vegetables (raw, steamed, roasted, or grilled). Green salads. Fruits  All fresh, canned (in natural juice), or frozen fruits. Meats and other protein foods  Ground beef (85% or leaner), grass-fed beef, or beef trimmed of fat. Skinless chicken or [place]. Ground chicken or [place].  Pork trimmed of fat. All fish and seafood. Eggs. Dried beans, peas, or lentils. Unsalted nuts or seeds. Unsalted canned or dry beans. Dairy  Low-fat dairy products, such as skim or 1% milk, 2% or reduced-fat cheeses, low-fat ricotta or cottage cheese, or plain low-fat yo Fats and oils  Tub margarines  without trans fats. Light or reduced-fat mayonnaise and salad dressings. Avocado. Olive, canola, sesame, or safflower oils. Natural peanut or almond butter (choose ones without added sugar and oil). The items listed above may not be a complete list of recommended foods or beverages. Contact your dietitian for more options. Foods to avoid Grains  White bread. White pasta. White rice. Cornbread. Bagels, pastries, and croissants. Crackers that contain trans fat. Vegetables  White potatoes. Corn. Creamed or fried vegetables. Vegetables in a cheese sauce. Fruits  Dried fruits. Canned fruit in light or heavy syrup. Fruit juice. Meats and other protein foods  Fatty cuts of meat. Ribs, chicken wings, bacon, sausage, bologna, salami, chitterlings, fatback, hot dogs, bratwurst, and packaged luncheon meats. Liver and organ meats. Dairy  Whole or 2% milk, cream, half-and-half, and cream cheese. Whole milk cheeses. Whole-fat or sweetened yogurt. Full-fat cheeses. Nondairy creamers and whipped toppings. Processed cheese, cheese spreads, or cheese curds. Beverages  Alcohol. Sweetened drinks (such as sodas, lemonade, and fruit drinks or punches). Fats and oils  Butter, stick margarine, lard, shortening, ghee, or bacon fat. Coconut, palm kernel, or palm oils. Sweets and desserts  Corn syrup, sugars, honey, and molasses. Candy. Jam and jelly. Syrup. Sweetened cereals. Cookies, pies, cakes, donuts, muffins, and ice cream. The items listed above may not be a complete list of foods and beverages to avoid. Contact your dietitian for more information. This information is not intended to replace advice given to you by your health care provider. Make sure you discuss any questions you have with your health care provider. Document Released: 01/07/2005 Document Revised: 01/28/2014 Document Reviewed: 04/07/2013 Elsevier Interactive Patient Education  2017 [employer].   Mammogram A mammogram is an X-ray of  the breasts that is done to check for abnormal changes. This procedure can screen for and detect any changes that may suggest breast cancer. A mammogram can also identify other changes and variations in the breast, such as:  Inflammation of the breast tissue (mastitis).  An infected area that contains a collection of pus (abscess).  A fluid-filled sac (cyst).  Fibrocystic changes. This is when breast tissue becomes denser, which can make the tissue feel rope-like or uneven under the skin.  Tumors that are not cancerous (benign).  Tell a health care provider about:  Any allergies you have.  If you have breast implants.  If you have had previous breast disease, biopsy, or surgery.  If you are breastfeeding.  Any possibility that you could be pregnant, if this applies.  If you are younger than age 71.  If you have a family history of breast cancer. What are the risks? Generally, this is a safe procedure. However, problems may occur, including:  Exposure to radiation. Radiation levels are very low with this test.  The results being misinterpreted.  The need for further tests.  The inability of the mammogram to detect certain cancers.  What happens before the procedure?  Schedule your test about 1-2 weeks after your menstrual period. This is usually when your breasts are the least tender.  If you have had a mammogram done at a different facility in the past, get the mammogram X-rays or have them  sent to your current exam facility in order to compare them.  Wash your breasts and under your arms the day of the test.  Do not wear deodorants, perfumes, lotions, or powders anywhere on your body on the day of the test.  Remove any jewelry from your neck.  Wear clothes that you can change into and out of easily. What happens during the procedure?  You will undress from the waist up and put on a gown.  You will stand in front of the X-ray machine.  Each breast will be  placed between two plastic or glass plates. The plates will compress your breast for a few seconds. Try to stay as relaxed as possible during the procedure. This does not cause any harm to your breasts and any discomfort you feel will be very brief.  X-rays will be taken from different angles of each breast. The procedure may vary among health care providers and hospitals. What happens after the procedure?  The mammogram will be examined by a specialist (radiologist).  You may need to repeat certain parts of the test, depending on the quality of the images. This is commonly done if the radiologist needs a better view of the breast tissue.  Ask when your test results will be ready. Make sure you get your test results.  You may resume your normal activities. This information is not intended to replace advice given to you by your health care provider. Make sure you discuss any questions you have with your health care provider. Document Released: 01/05/2000 Document Revised: 06/12/2015 Document Reviewed: 03/18/2014 Elsevier Interactive Patient Education  2017 [employer] Pneumococcal Conjugate Vaccine (PCV13) What You Need to Know 1. Why get vaccinated? Vaccination can protect both children and adults from pneumococcal disease. Pneumococcal disease is caused by bacteria that can spread from person to person through close contact. It can cause ear infections, and it can also lead to more serious infections of the:  Lungs (pneumonia),  Blood (bacteremia), and  Covering of the brain and spinal cord (meningitis).  Pneumococcal pneumonia is most common among adults. Pneumococcal meningitis can cause deafness and brain damage, and it kills about 1 child in 10 who get it. Anyone can get pneumococcal disease, but children under 28 years of age and adults 72 years and older, people with certain medical conditions, and cigarette smokers are at the highest risk. Before there was a vaccine, the [place] saw:  more than 700 cases of meningitis,  about 13,000 blood infections,  about 5 million ear infections, and  about 200 deaths  in children under 5 each year from pneumococcal disease. Since vaccine became available, severe pneumococcal disease in these children has fallen by 88%. About 18,000 older adults die of pneumococcal disease each year in the [place]. Treatment of pneumococcal infections with penicillin and other drugs is not as effective as it used to be, because some strains of the disease have become resistant to these drugs. This makes prevention of the disease, through vaccination, even more important. 2. PCV13 vaccine Pneumococcal conjugate vaccine (called PCV13) protects against 13 types of pneumococcal bacteria. PCV13 is routinely given to children at 2, 4, 6, and 20-13 months of age. It is also recommended for children and adults 45 to 44 years of age with certain health conditions, and for all adults 31 years of age and older. Your doctor can give you details. 3. Some people should not get this vaccine Anyone who has ever had a life-threatening allergic  reaction to a dose of this vaccine, to an earlier pneumococcal vaccine called PCV7, or to any vaccine containing diphtheria toxoid (for example, DTaP), should not get PCV13. Anyone with a severe allergy to any component of PCV13 should not get the vaccine. Tell your doctor if the person being vaccinated has any severe allergies. If the person scheduled for vaccination is not feeling well, your healthcare provider might decide to reschedule the shot on another day. 4. Risks of a vaccine reaction With any medicine, including vaccines, there is a chance of reactions. These are usually mild and go away on their own, but serious reactions are also possible. Problems reported following PCV13 varied by age and dose in the series. The most common problems reported among children were:  About half became drowsy after the  shot, had a temporary loss of appetite, or had redness or tenderness where the shot was given.  About 1 out of 3 had swelling where the shot was given.  About 1 out of 3 had a mild fever, and about 1 in 20 had a fever over 102.63F.  Up to about 8 out of 10 became fussy or irritable.  Adults have reported pain, redness, and swelling where the shot was given; also mild fever, fatigue, headache, chills, or muscle pain. Young children who get PCV13 along with inactivated flu vaccine at the same time may be at increased risk for seizures caused by fever. Ask your doctor for more information. Problems that could happen after any vaccine:  People sometimes faint after a medical procedure, including vaccination. Sitting or lying down for about 15 minutes can help prevent fainting, and injuries caused by a fall. Tell your doctor if you feel dizzy, or have vision changes or ringing in the ears.  Some older children and adults get severe pain in the shoulder and have difficulty moving the arm where a shot was given. This happens very rarely.  Any medication can cause a severe allergic reaction. Such reactions from a vaccine are very rare, estimated at about 1 in a million doses, and would happen within a few minutes to a few hours after the vaccination. As with any medicine, there is a very small chance of a vaccine causing a serious injury or death. The safety of vaccines is always being monitored. For more information, visit: http://floyd.org/ 5. What if there is a serious reaction? What should I look for? Look for anything that concerns you, such as signs of a severe allergic reaction, very high fever, or unusual behavior. Signs of a severe allergic reaction can include hives, swelling of the face and throat, difficulty breathing, a fast heartbeat, dizziness, and weakness-usually within a few minutes to a few hours after the vaccination. What should I do?  If you think it is a severe  allergic reaction or other emergency that can't wait, call 9-1-1 or get the person to the nearest hospital. Otherwise, call your doctor.  Reactions should be reported to the Vaccine Adverse Event Reporting System (VAERS). Your doctor should file this report, or you can do it yourself through the VAERS web site at www.vaers.LAgents.no, or by calling 1-440-522-2887. ? VAERS does not give medical advice. 6. The National Vaccine Injury Compensation Program The [employer] Vaccine Injury Compensation Program (VICP) is a federal program that was created to compensate people who may have been injured by certain vaccines. Persons who believe they may have been injured by a vaccine can learn about the program and about filing a claim by  calling 1-380-530-5823 or visiting the VICP website at SpiritualWord.at. There is a time limit to file a claim for compensation. 7. How can I learn more?  Ask your healthcare provider. He or she can give you the vaccine package insert or suggest other sources of information.  Call your local or state health department.  Contact the Centers for Disease Control and Prevention (CDC): ? Call 380-691-6866 (1-800-CDC-INFO) or ? Visit CDC's website at PicCapture.uy Vaccine Information Statement, PCV13 Vaccine (11/25/2013) This information is not intended to replace advice given to you by your health care provider. Make sure you discuss any questions you have with your health care provider. Document Released: 11/04/2005 Document Revised: 09/28/2015 Document Reviewed: 09/28/2015 Elsevier Interactive Patient Education  2017 Elsevier Inc. Pneumococcal Conjugate Vaccine (PCV13) What You Need to Know 1. Why get vaccinated? Vaccination can protect both children and adults from pneumococcal disease. Pneumococcal disease is caused by bacteria that can spread from person to person through close contact. It can cause ear infections, and it can also lead to more  serious infections of the:  Lungs (pneumonia),  Blood (bacteremia), and  Covering of the brain and spinal cord (meningitis).  Pneumococcal pneumonia is most common among adults. Pneumococcal meningitis can cause deafness and brain damage, and it kills about 1 child in 10 who get it. Anyone can get pneumococcal disease, but children under 61 years of age and adults 60 years and older, people with certain medical conditions, and cigarette smokers are at the highest risk. Before there was a vaccine, the [place] saw:  more than 700 cases of meningitis,  about 13,000 blood infections,  about 5 million ear infections, and  about 200 deaths  in children under 5 each year from pneumococcal disease. Since vaccine became available, severe pneumococcal disease in these children has fallen by 88%. About 18,000 older adults die of pneumococcal disease each year in the [place]. Treatment of pneumococcal infections with penicillin and other drugs is not as effective as it used to be, because some strains of the disease have become resistant to these drugs. This makes prevention of the disease, through vaccination, even more important. 2. PCV13 vaccine Pneumococcal conjugate vaccine (called PCV13) protects against 13 types of pneumococcal bacteria. PCV13 is routinely given to children at 2, 4, 6, and 45-78 months of age. It is also recommended for children and adults 30 to 20 years of age with certain health conditions, and for all adults 24 years of age and older. Your doctor can give you details. 3. Some people should not get this vaccine Anyone who has ever had a life-threatening allergic reaction to a dose of this vaccine, to an earlier pneumococcal vaccine called PCV7, or to any vaccine containing diphtheria toxoid (for example, DTaP), should not get PCV13. Anyone with a severe allergy to any component of PCV13 should not get the vaccine. Tell your doctor if the person being vaccinated has  any severe allergies. If the person scheduled for vaccination is not feeling well, your healthcare provider might decide to reschedule the shot on another day. 4. Risks of a vaccine reaction With any medicine, including vaccines, there is a chance of reactions. These are usually mild and go away on their own, but serious reactions are also possible. Problems reported following PCV13 varied by age and dose in the series. The most common problems reported among children were:  About half became drowsy after the shot, had a temporary loss of appetite, or had redness or tenderness where  the shot was given.  About 1 out of 3 had swelling where the shot was given.  About 1 out of 3 had a mild fever, and about 1 in 20 had a fever over 102.66F.  Up to about 8 out of 10 became fussy or irritable.  Adults have reported pain, redness, and swelling where the shot was given; also mild fever, fatigue, headache, chills, or muscle pain. Young children who get PCV13 along with inactivated flu vaccine at the same time may be at increased risk for seizures caused by fever. Ask your doctor for more information. Problems that could happen after any vaccine:  People sometimes faint after a medical procedure, including vaccination. Sitting or lying down for about 15 minutes can help prevent fainting, and injuries caused by a fall. Tell your doctor if you feel dizzy, or have vision changes or ringing in the ears.  Some older children and adults get severe pain in the shoulder and have difficulty moving the arm where a shot was given. This happens very rarely.  Any medication can cause a severe allergic reaction. Such reactions from a vaccine are very rare, estimated at about 1 in a million doses, and would happen within a few minutes to a few hours after the vaccination. As with any medicine, there is a very small chance of a vaccine causing a serious injury or death. The safety of vaccines is always being  monitored. For more information, visit: http://floyd.org/ 5. What if there is a serious reaction? What should I look for? Look for anything that concerns you, such as signs of a severe allergic reaction, very high fever, or unusual behavior. Signs of a severe allergic reaction can include hives, swelling of the face and throat, difficulty breathing, a fast heartbeat, dizziness, and weakness-usually within a few minutes to a few hours after the vaccination. What should I do?  If you think it is a severe allergic reaction or other emergency that can't wait, call 9-1-1 or get the person to the nearest hospital. Otherwise, call your doctor.  Reactions should be reported to the Vaccine Adverse Event Reporting System (VAERS). Your doctor should file this report, or you can do it yourself through the VAERS web site at www.vaers.LAgents.no, or by calling 1-(516) 193-3673. ? VAERS does not give medical advice. 6. The National Vaccine Injury Compensation Program The [employer] Vaccine Injury Compensation Program (VICP) is a federal program that was created to compensate people who may have been injured by certain vaccines. Persons who believe they may have been injured by a vaccine can learn about the program and about filing a claim by calling 1-682-018-3813 or visiting the VICP website at SpiritualWord.at. There is a time limit to file a claim for compensation. 7. How can I learn more?  Ask your healthcare provider. He or she can give you the vaccine package insert or suggest other sources of information.  Call your local or state health department.  Contact the Centers for Disease Control and Prevention (CDC): ? Call 4457144003 (1-800-CDC-INFO) or ? Visit CDC's website at PicCapture.uy Vaccine Information Statement, PCV13 Vaccine (11/25/2013) This information is not intended to replace advice given to you by your health care provider. Make sure you discuss any questions  you have with your health care provider. Document Released: 11/04/2005 Document Revised: 09/28/2015 Document Reviewed: 09/28/2015 Elsevier Interactive Patient Education  2017 Elsevier Inc. Influenza (Flu) Vaccine (Inactivated or Recombinant): What You Need to Know 1. Why get vaccinated? Influenza ("flu") is a contagious disease that  spreads around the [place] every year, usually between October and May. Flu is caused by influenza viruses, and is spread mainly by coughing, sneezing, and close contact. Anyone can get flu. Flu strikes suddenly and can last several days. Symptoms vary by age, but can include:  fever/chills  sore throat  muscle aches  fatigue  cough  headache  runny or stuffy nose  Flu can also lead to pneumonia and blood infections, and cause diarrhea and seizures in children. If you have a medical condition, such as heart or lung disease, flu can make it worse. Flu is more dangerous for some people. Infants and young children, people 55 years of age and older, pregnant women, and people with certain health conditions or a weakened immune system are at greatest risk. Each year thousands of people in the [place] die from flu, and many more are hospitalized. Flu vaccine can:  keep you from getting flu,  make flu less severe if you do get it, and  keep you from spreading flu to your family and other people. 2. Inactivated and recombinant flu vaccines A dose of flu vaccine is recommended every flu season. Children 6 months through 85 years of age may need two doses during the same flu season. Everyone else needs only one dose each flu season. Some inactivated flu vaccines contain a very small amount of a mercury-based preservative called thimerosal. Studies have not shown thimerosal in vaccines to be harmful, but flu vaccines that do not contain thimerosal are available. There is no live flu virus in flu shots. They cannot cause the flu. There are many flu  viruses, and they are always changing. Each year a new flu vaccine is made to protect against three or four viruses that are likely to cause disease in the upcoming flu season. But even when the vaccine doesn't exactly match these viruses, it may still provide some protection. Flu vaccine cannot prevent:  flu that is caused by a virus not covered by the vaccine, or  illnesses that look like flu but are not.  It takes about 2 weeks for protection to develop after vaccination, and protection lasts through the flu season. 3. Some people should not get this vaccine Tell the person who is giving you the vaccine:  If you have any severe, life-threatening allergies. If you ever had a life-threatening allergic reaction after a dose of flu vaccine, or have a severe allergy to any part of this vaccine, you may be advised not to get vaccinated. Most, but not all, types of flu vaccine contain a small amount of egg protein.  If you ever had Guillain-Barr Syndrome (also called GBS). Some people with a history of GBS should not get this vaccine. This should be discussed with your doctor.  If you are not feeling well. It is usually okay to get flu vaccine when you have a mild illness, but you might be asked to come back when you feel better.  4. Risks of a vaccine reaction With any medicine, including vaccines, there is a chance of reactions. These are usually mild and go away on their own, but serious reactions are also possible. Most people who get a flu shot do not have any problems with it. Minor problems following a flu shot include:  soreness, redness, or swelling where the shot was given  hoarseness  sore, red or itchy eyes  cough  fever  aches  headache  itching  fatigue  If these problems occur, they  usually begin soon after the shot and last 1 or 2 days. More serious problems following a flu shot can include the following:  There may be a small increased risk of Guillain-Barre  Syndrome (GBS) after inactivated flu vaccine. This risk has been estimated at 1 or 2 additional cases per million people vaccinated. This is much lower than the risk of severe complications from flu, which can be prevented by flu vaccine.  Young children who get the flu shot along with pneumococcal vaccine (PCV13) and/or DTaP vaccine at the same time might be slightly more likely to have a seizure caused by fever. Ask your doctor for more information. Tell your doctor if a child who is getting flu vaccine has ever had a seizure.  Problems that could happen after any injected vaccine:  People sometimes faint after a medical procedure, including vaccination. Sitting or lying down for about 15 minutes can help prevent fainting, and injuries caused by a fall. Tell your doctor if you feel dizzy, or have vision changes or ringing in the ears.  Some people get severe pain in the shoulder and have difficulty moving the arm where a shot was given. This happens very rarely.  Any medication can cause a severe allergic reaction. Such reactions from a vaccine are very rare, estimated at about 1 in a million doses, and would happen within a few minutes to a few hours after the vaccination. As with any medicine, there is a very remote chance of a vaccine causing a serious injury or death. The safety of vaccines is always being monitored. For more information, visit: http://floyd.org/www.cdc.gov/vaccinesafety/ 5. What if there is a serious reaction? What should I look for? Look for anything that concerns you, such as signs of a severe allergic reaction, very high fever, or unusual behavior. Signs of a severe allergic reaction can include hives, swelling of the face and throat, difficulty breathing, a fast heartbeat, dizziness, and weakness. These would start a few minutes to a few hours after the vaccination. What should I do?  If you think it is a severe allergic reaction or other emergency that can't wait, call 9-1-1 and get  the person to the nearest hospital. Otherwise, call your doctor.  Reactions should be reported to the Vaccine Adverse Event Reporting System (VAERS). Your doctor should file this report, or you can do it yourself through the VAERS web site at www.vaers.LAgents.nohhs.gov, or by calling 1-306-531-0997. ? VAERS does not give medical advice. 6. The National Vaccine Injury Compensation Program The [employer] Vaccine Injury Compensation Program (VICP) is a federal program that was created to compensate people who may have been injured by certain vaccines. Persons who believe they may have been injured by a vaccine can learn about the program and about filing a claim by calling 1-971-506-4843 or visiting the VICP website at SpiritualWord.atwww.hrsa.gov/vaccinecompensation. There is a time limit to file a claim for compensation. 7. How can I learn more?  Ask your healthcare provider. He or she can give you the vaccine package insert or suggest other sources of information.  Call your local or state health department.  Contact the Centers for Disease Control and Prevention (CDC): ? Call 630-017-24621-905-822-1029 (1-800-CDC-INFO) or ? Visit CDC's website at BiotechRoom.com.cywww.cdc.gov/flu Vaccine Information Statement, Inactivated Influenza Vaccine (08/27/2013) This information is not intended to replace advice given to you by your health care provider. Make sure you discuss any questions you have with your health care provider. Document Released: 11/01/2005 Document Revised: 09/28/2015 Document Reviewed: 09/28/2015 Elsevier Interactive  Patient Education  2017 Elsevier Inc.  

## 2016-11-14 ENCOUNTER — Encounter: Payer: Self-pay | Admitting: Family Medicine

## 2016-11-14 LAB — CBC WITH DIFFERENTIAL/PLATELET
BASOS: 1 %
Basophils Absolute: 0.1 10*3/uL (ref 0.0–0.2)
EOS (ABSOLUTE): 0.1 10*3/uL (ref 0.0–0.4)
EOS: 2 %
HEMATOCRIT: 41.8 % (ref 34.0–46.6)
HEMOGLOBIN: 13.9 g/dL (ref 11.1–15.9)
IMMATURE GRANS (ABS): 0 10*3/uL (ref 0.0–0.1)
Immature Granulocytes: 0 %
LYMPHS ABS: 2.5 10*3/uL (ref 0.7–3.1)
Lymphs: 37 %
MCH: 29.3 pg (ref 26.6–33.0)
MCHC: 33.3 g/dL (ref 31.5–35.7)
MCV: 88 fL (ref 79–97)
MONOCYTES: 7 %
Monocytes Absolute: 0.5 10*3/uL (ref 0.1–0.9)
NEUTROS ABS: 3.5 10*3/uL (ref 1.4–7.0)
Neutrophils: 53 %
Platelets: 298 10*3/uL (ref 150–379)
RBC: 4.75 x10E6/uL (ref 3.77–5.28)
RDW: 14.1 % (ref 12.3–15.4)
WBC: 6.6 10*3/uL (ref 3.4–10.8)

## 2016-11-14 LAB — COMPREHENSIVE METABOLIC PANEL
ALBUMIN: 4.1 g/dL (ref 3.6–4.8)
ALK PHOS: 57 IU/L (ref 39–117)
ALT: 25 IU/L (ref 0–32)
AST: 27 IU/L (ref 0–40)
Albumin/Globulin Ratio: 1.6 (ref 1.2–2.2)
BILIRUBIN TOTAL: 0.6 mg/dL (ref 0.0–1.2)
BUN / CREAT RATIO: 16 (ref 12–28)
BUN: 15 mg/dL (ref 8–27)
CHLORIDE: 97 mmol/L (ref 96–106)
CO2: 28 mmol/L (ref 20–29)
CREATININE: 0.92 mg/dL (ref 0.57–1.00)
Calcium: 9.9 mg/dL (ref 8.7–10.3)
GFR calc Af Amer: 76 mL/min/{1.73_m2} (ref >59)
GFR calc non Af Amer: 66 mL/min/{1.73_m2} (ref >59)
GLOBULIN, TOTAL: 2.5 g/dL (ref 1.5–4.5)
GLUCOSE: 95 mg/dL (ref 65–99)
Potassium: 3.5 mmol/L (ref 3.5–5.2)
SODIUM: 143 mmol/L (ref 134–144)
Total Protein: 6.6 g/dL (ref 6.0–8.5)

## 2016-11-14 LAB — LIPID PANEL W/O CHOL/HDL RATIO
CHOLESTEROL TOTAL: 150 mg/dL (ref 100–199)
HDL: 44 mg/dL (ref >39)
LDL CALC: 78 mg/dL (ref 0–99)
TRIGLYCERIDES: 142 mg/dL (ref 0–149)
VLDL Cholesterol Cal: 28 mg/dL (ref 5–40)

## 2016-11-16 NOTE — Assessment & Plan Note (Signed)
Await fasting lipid results, continue current regimen

## 2016-11-16 NOTE — Assessment & Plan Note (Signed)
Stable and WNL, continue current regimen 

## 2016-11-19 ENCOUNTER — Telehealth: Payer: Self-pay | Admitting: Family Medicine

## 2016-11-19 NOTE — Telephone Encounter (Signed)
Copied from CRM #[id number]. Topic: Quick Communication - See Telephone Encounter >> Nov 19, 2016  9:22 AM Stephannie LiSimmons, Milina Pagett L, NT wrote: CRM for notification. See Telephone encounter for:  11/19/16 Patient called wanting lab results from physical from last week.

## 2016-11-21 ENCOUNTER — Telehealth: Payer: Self-pay | Admitting: Family Medicine

## 2016-11-21 DIAGNOSIS — Z1239 Encounter for other screening for malignant neoplasm of breast: Secondary | ICD-10-CM

## 2016-11-21 NOTE — Telephone Encounter (Signed)
-----   Message from Hyman HopesMelissa D Burleson, [place] sent at 11/18/2016 10:38 AM EDT ----- This patient is due for her yearly mammogram and follow up of left breast so she needs the following: Diagnostic Bilateral Tomo(IMG 5535) with Left Breast Ultrasound(IMG 5531) and Right Breast Ultrasound(IMG 5532).  Thanks!

## 2016-11-21 NOTE — Telephone Encounter (Signed)
Orders placed as advised. Thanks! Roosvelt Maserachel Nahara Dona

## 2016-11-22 NOTE — Telephone Encounter (Signed)
Mammo is ordered, but patient also wants lab results.

## 2016-11-22 NOTE — Telephone Encounter (Signed)
Copied from CRM #[id number]. Topic: Referral - Request >> Nov 21, 2016 11:16 AM Trula SladeWalter, Linda F wrote: Reason for CRM: Patient [facility] a [facility] and she also wants her latest lab results.

## 2016-11-22 NOTE — Telephone Encounter (Signed)
Patient notified of her lab results. She called and scheduled her mammo.

## 2016-11-22 NOTE — Telephone Encounter (Signed)
I had sent the results via letter on 10/25. All of her lab results came back perfectly normal

## 2017-01-01 ENCOUNTER — Other Ambulatory Visit: Payer: Self-pay | Admitting: Family Medicine

## 2017-01-01 MED ORDER — PRAVASTATIN SODIUM 40 MG PO TABS: 40.0000 mg | ORAL_TABLET | Freq: Every day | ORAL | 2 refills | Status: DC

## 2017-01-01 NOTE — Telephone Encounter (Signed)
Medication refilled at requested pharmacy.  

## 2017-01-01 NOTE — Telephone Encounter (Signed)
Copied from CRM [id number]. Topic: Quick Communication - Rx Refill/Question >> Jan 01, 2017  8:56 AM Anice PaganiniMunoz, Marland Reine I, NT wrote: Has the patient contacted their pharmacy yes no:[phone]}  (Agent: If no, request that the patient contact the pharmacy for the refill.)  Preferred Pharmacy (with phone number or street name): Pravachol 40 MG  Agent: Please be advised that RX refills may take up to 3 business days. We ask that you follow-up with your pharmacy.[facility] [place] [phone]

## 2017-01-08 ENCOUNTER — Other Ambulatory Visit: Payer: Self-pay

## 2017-02-10 ENCOUNTER — Ambulatory Visit
Admission: RE | Admit: 2017-02-10 | Discharge: 2017-02-10 | Disposition: A | Discharge status: Home / Self Care | Payer: Medicare Other | Source: Ambulatory Visit | Attending: Family Medicine | Admitting: Family Medicine

## 2017-02-10 ENCOUNTER — Other Ambulatory Visit: Payer: Self-pay | Admitting: Family Medicine

## 2017-02-10 DIAGNOSIS — Z1239 Encounter for other screening for malignant neoplasm of breast: Secondary | ICD-10-CM

## 2017-02-10 DIAGNOSIS — Z1231 Encounter for screening mammogram for malignant neoplasm of breast: Secondary | ICD-10-CM | POA: Insufficient documentation

## 2017-02-10 DIAGNOSIS — R922 Inconclusive mammogram: Secondary | ICD-10-CM | POA: Diagnosis not present

## 2017-02-10 NOTE — Telephone Encounter (Signed)
Looks like this was discontinued 11/13/16 by Roosvelt Maserachel Lane

## 2017-03-04 ENCOUNTER — Other Ambulatory Visit: Payer: Self-pay | Admitting: Family Medicine

## 2017-03-04 ENCOUNTER — Telehealth: Payer: Self-pay | Admitting: Family Medicine

## 2017-03-04 NOTE — Telephone Encounter (Signed)
Copied from CRM [phone]. Topic: Inquiry >> Mar 04, 2017  9:40 AM Yvonna Alanisobinson, Andra M wrote: Reason for CRM: Patient called requesting refills of Gabapentin (NEURONTIN) 300 MG capsule and Citalopram (CELEXA) 40 MG tablet. Patient's preferred pharmacy is [facility] - [place], [place] - [address]      Thank You!!! >> Mar 04, 2017 11:11 AM Marshall CorkBullock, Keri L, CMA wrote: Medication is at patients pharmacy. >> Mar 04, 2017 11:11 AM Marshall CorkBullock, Keri L, CMA wrote: Please notify patient. / Attempted to call patient to notify that prescription is at pharmacy.  Left Voicemail that [facility]atiosn [facility]

## 2017-03-06 ENCOUNTER — Telehealth: Payer: Self-pay | Admitting: Family Medicine

## 2017-03-06 ENCOUNTER — Other Ambulatory Visit: Payer: Self-pay | Admitting: Family Medicine

## 2017-03-06 NOTE — Telephone Encounter (Signed)
Refill  Request  Hydroxyzine  25  Mg  LOV 11/13/2016  Last  Filled  02/10/2017  Pharmacy  -  [facility]

## 2017-03-06 NOTE — Telephone Encounter (Signed)
Copied from CRM [phone]. Topic: Quick Communication - Rx Refill/Question >> Mar 06, 2017  9:29 AM Oneal GroutSebastian, Jennifer S wrote: Medication: hydrOXYzine (VISTARIL) 25 MG capsule    Has the patient contacted their pharmacy? Yes.     (Agent: If no, request that the patient contact the pharmacy for the refill.)   Preferred Pharmacy (with phone number or street name): [facility]   Agent: Please be advised that RX refills may take up to 3 business days. We ask that you follow-up with your pharmacy.

## 2017-03-06 NOTE — Telephone Encounter (Signed)
Looks like I [facility]

## 2017-03-17 ENCOUNTER — Other Ambulatory Visit: Payer: Self-pay | Admitting: Family Medicine

## 2017-03-17 NOTE — Telephone Encounter (Signed)
Pt states she uses  [facility] [place] [address] [phone] ([phone] (Fax)

## 2017-03-28 ENCOUNTER — Other Ambulatory Visit: Payer: Self-pay | Admitting: Family Medicine

## 2017-03-28 ENCOUNTER — Other Ambulatory Visit: Payer: Self-pay | Admitting: *Deleted

## 2017-03-28 MED ORDER — HYDROXYZINE PAMOATE 25 MG PO CAPS: 25.0000 mg | ORAL_CAPSULE | Freq: Three times a day (TID) | ORAL | 0 refills | Status: DC | PRN

## 2017-03-28 NOTE — Telephone Encounter (Signed)
Copied from CRM [id number]. Topic: Quick Communication - Rx Refill/Question >> Mar 28, 2017  4:30 PM Alexander BergeronBarksdale, Harvey B wrote: Medication: hydrOXYzine (VISTARIL) 25 MG capsule [[id number]][[id number]]    Has the patient contacted their pharmacy? Yes.     (Agent: If no, request that the patient contact the pharmacy for the refill.)   Preferred Pharmacy (with phone number or street [facility]): [address] [facility]   Agent: Please be advised that RX refills may take up to 3 business days. We ask that you follow-up with your pharmacy.

## 2017-04-16 ENCOUNTER — Ambulatory Visit (INDEPENDENT_AMBULATORY_CARE_PROVIDER_SITE_OTHER): Payer: Medicare Other | Admitting: Family Medicine

## 2017-04-16 ENCOUNTER — Encounter: Payer: Self-pay | Admitting: Family Medicine

## 2017-04-16 VITALS — BP 138/84 | HR 81 | Temp 97.9°F | Wt 172.6 lb

## 2017-04-16 DIAGNOSIS — F419 Anxiety disorder, unspecified: Secondary | ICD-10-CM

## 2017-04-16 DIAGNOSIS — E782 Mixed hyperlipidemia: Secondary | ICD-10-CM

## 2017-04-16 DIAGNOSIS — R21 Rash and other nonspecific skin eruption: Secondary | ICD-10-CM | POA: Diagnosis not present

## 2017-04-16 DIAGNOSIS — R350 Frequency of micturition: Secondary | ICD-10-CM

## 2017-04-16 DIAGNOSIS — I10 Essential (primary) hypertension: Secondary | ICD-10-CM

## 2017-04-16 LAB — UA/M W/RFLX CULTURE, ROUTINE
BILIRUBIN UA: NEGATIVE
GLUCOSE, UA: NEGATIVE
Ketones, UA: NEGATIVE
LEUKOCYTES UA: NEGATIVE
Nitrite, UA: NEGATIVE
PH UA: 5.5 (ref 5.0–7.5)
PROTEIN UA: NEGATIVE
RBC, UA: NEGATIVE
Specific Gravity, UA: <1.005 — ABNORMAL LOW (ref 1.005–1.030)
Urobilinogen, Ur: 0.2 mg/dL (ref 0.2–1.0)

## 2017-04-16 MED ORDER — CLONIDINE HCL 0.1 MG PO TABS: 0.1000 mg | ORAL_TABLET | Freq: Every day | ORAL | 3 refills | Status: DC

## 2017-04-16 MED ORDER — CITALOPRAM HYDROBROMIDE 40 MG PO TABS: 40.0000 mg | ORAL_TABLET | Freq: Every day | ORAL | 3 refills | Status: DC

## 2017-04-16 MED ORDER — TRIAMCINOLONE ACETONIDE 0.1 % EX CREA: 1.0000 "application " | TOPICAL_CREAM | Freq: Two times a day (BID) | CUTANEOUS | 3 refills | Status: DC

## 2017-04-16 MED ORDER — PRAVASTATIN SODIUM 40 MG PO TABS: 40.0000 mg | ORAL_TABLET | Freq: Every day | ORAL | 3 refills | Status: DC

## 2017-04-16 MED ORDER — GABAPENTIN 300 MG PO CAPS: 600.0000 mg | ORAL_CAPSULE | Freq: Every day | ORAL | 3 refills | Status: DC

## 2017-04-16 MED ORDER — HYDROCHLOROTHIAZIDE 25 MG PO TABS: 25.0000 mg | ORAL_TABLET | Freq: Every day | ORAL | 3 refills | Status: DC

## 2017-04-16 MED ORDER — HYDROXYZINE PAMOATE 25 MG PO CAPS
25.0000 mg | ORAL_CAPSULE | Freq: Three times a day (TID) | ORAL | 3 refills | Status: DC | PRN
Start: 2017-04-16 — End: 2017-10-16

## 2017-04-16 NOTE — Progress Notes (Signed)
BP 138/84   Pulse 81   Temp 97.9 F (36.6 C) (Oral)   Wt 172 lb 9.6 oz (78.3 kg)   SpO2 94%   BMI 26.63 kg/m    Subjective:    Patient ID: Marcia [name], female    DOB: 1950-07-29, 67 y.o.   MRN: [id number]  HPI: Marcia [name] is a 67 y.o. female  Chief Complaint  Patient presents with  . Follow-up    3 month  . Urinary Frequency    x's 4 days. Patient requested to be tested for UTI.   Urinary frequency for most of the week, improving. Wanting to r/o UTI. Does not have a hx of frequent UTIs. Denies fever, chills, sweats, N/V, abdominal pain.   BPs under good control when checked at home, denies side effects, CP, SOB, HAs, dizziness. Compliant with medication regimen.  Taking pravastatin without concerns for her cholesterol. Denies myalgias, fatigue, CP, SOB.   Anxiety under good control with celexa and prn hydroxyzine. No side effects noted. Denies SI/HI.   Still having intermittent itchy rash pop up in sporadic areas. Unknown trigger. Triamcinolone cream helping resolve flares.   Relevant past medical, surgical, family and social history reviewed and updated as indicated. Interim medical history since our last visit reviewed. Allergies and medications reviewed and updated.  Review of Systems  Per HPI unless specifically indicated above     Objective:    BP 138/84   Pulse 81   Temp 97.9 F (36.6 C) (Oral)   Wt 172 lb 9.6 oz (78.3 kg)   SpO2 94%   BMI 26.63 kg/m   Wt Readings from Last 3 Encounters:  04/16/17 172 lb 9.6 oz (78.3 kg)  11/13/16 149 lb 6.4 oz (67.8 kg)  07/17/16 156 lb (70.8 kg)    Physical Exam  Results for orders placed or performed in visit on 04/16/17  UA/M w/rflx Culture, Routine  Result Value Ref Range   Specific Gravity, UA <1.005 (L) 1.005 - 1.030   pH, UA 5.5 5.0 - 7.5   Color, UA Straw Yellow   Appearance Ur Clear Clear   Leukocytes, UA Negative Negative   Protein, UA Negative Negative/Trace   Glucose, UA Negative Negative    Ketones, UA Negative Negative   RBC, UA Negative Negative   Bilirubin, UA Negative Negative   Urobilinogen, Ur 0.2 0.2 - 1.0 mg/dL   Nitrite, UA Negative Negative      Assessment & Plan:   Problem List Items Addressed This Visit      Cardiovascular and Mediastinum   Hypertension    Stable, under fairly good control. Continue current regimen      Relevant Medications   cloNIDine (CATAPRES) 0.1 MG tablet   hydrochlorothiazide (HYDRODIURIL) 25 MG tablet   pravastatin (PRAVACHOL) 40 MG tablet     Other   Hyperlipidemia    Continue current regimen and diet/exercise modifications. Stable      Relevant Medications   cloNIDine (CATAPRES) 0.1 MG tablet   hydrochlorothiazide (HYDRODIURIL) 25 MG tablet   pravastatin (PRAVACHOL) 40 MG tablet   Anxiety    Stable and under good control, continue current regimen      Relevant Medications   citalopram (CELEXA) 40 MG tablet   hydrOXYzine (VISTARIL) 25 MG capsule    Other Visit Diagnoses    Urinary frequency    -  Primary   U/A negative today, push fluids, start cranberry and probiotics for prevention. F/u if sxs worsening   Relevant  Orders   UA/M w/rflx Culture, Routine (Completed)   Rash       Controlled well with prn triamcinolone, avoid scented products, known irritants       Follow up plan: Return in about 6 months (around 10/17/2017) for BP, Chol f/u.

## 2017-04-19 DIAGNOSIS — F419 Anxiety disorder, unspecified: Secondary | ICD-10-CM | POA: Insufficient documentation

## 2017-04-19 NOTE — Patient Instructions (Signed)
Follow up in 6 months 

## 2017-04-19 NOTE — Assessment & Plan Note (Signed)
Continue current regimen and diet/exercise modifications. Stable

## 2017-04-19 NOTE — Assessment & Plan Note (Signed)
Stable, under fairly good control. Continue current regimen

## 2017-04-19 NOTE — Assessment & Plan Note (Signed)
Stable and under good control, continue current regimen 

## 2017-10-15 ENCOUNTER — Encounter: Payer: Self-pay | Admitting: Family Medicine

## 2017-10-15 ENCOUNTER — Other Ambulatory Visit: Payer: Self-pay

## 2017-10-15 ENCOUNTER — Ambulatory Visit (INDEPENDENT_AMBULATORY_CARE_PROVIDER_SITE_OTHER): Payer: Medicare Other | Admitting: Family Medicine

## 2017-10-15 VITALS — BP 125/80 | HR 82 | Temp 98.1°F | Ht 67.5 in | Wt 172.0 lb

## 2017-10-15 DIAGNOSIS — Z23 Encounter for immunization: Secondary | ICD-10-CM | POA: Diagnosis not present

## 2017-10-15 DIAGNOSIS — R300 Dysuria: Secondary | ICD-10-CM

## 2017-10-15 DIAGNOSIS — Z8639 Personal history of other endocrine, nutritional and metabolic disease: Secondary | ICD-10-CM | POA: Diagnosis not present

## 2017-10-15 DIAGNOSIS — I10 Essential (primary) hypertension: Secondary | ICD-10-CM | POA: Diagnosis not present

## 2017-10-15 DIAGNOSIS — E782 Mixed hyperlipidemia: Secondary | ICD-10-CM

## 2017-10-15 DIAGNOSIS — R5383 Other fatigue: Secondary | ICD-10-CM | POA: Diagnosis not present

## 2017-10-15 DIAGNOSIS — L989 Disorder of the skin and subcutaneous tissue, unspecified: Secondary | ICD-10-CM

## 2017-10-15 LAB — UA/M W/RFLX CULTURE, ROUTINE
BILIRUBIN UA: NEGATIVE
GLUCOSE, UA: NEGATIVE
KETONES UA: NEGATIVE
Leukocytes, UA: NEGATIVE
Nitrite, UA: NEGATIVE
PROTEIN UA: NEGATIVE
RBC UA: NEGATIVE
SPEC GRAV UA: 1.025 (ref 1.005–1.030)
Urobilinogen, Ur: 0.2 mg/dL (ref 0.2–1.0)
pH, UA: 5 (ref 5.0–7.5)

## 2017-10-15 NOTE — Progress Notes (Signed)
BP 125/80   Pulse 82   Temp 98.1 F (36.7 C) (Oral)   Ht 5' 7.5" (1.715 m)   Wt 172 lb (78 kg)   SpO2 96%   BMI 26.54 kg/m    Subjective:    Patient ID: Marcia [name], female    DOB: 20-Jun-1950, 67 y.o.   MRN: [id number]  HPI: Marcia [name] is a 67 y.o. female  Chief Complaint  Patient presents with  . Hypertension    6 m f/u  . Hyperlipidemia  . Back Pain    pt states has had urinary frequency for the last 4 days  . Other    pt requested B-12 shot and check rashes in right arm   Here today for 6 month f/u HTN and HLD. Doing well, taking medications faithfully without side effects. BPs WNL when checked at home. Denies CP, SOB, syncope, dizziness, myalgias, claudication.   Fatigue, worsening over time. Has hx of low B12 and would like to start injections. Has not had labs in several years for this. Moods doing well on celexa, does not feel it's related to that. Sleeping as well as ever.   Has 3 red places on right shoulder she would like looked at. Does not know if they are bug bites or rashes. Has been using triamcinolone cream on them with mild relief.   Relevant past medical, surgical, family and social history reviewed and updated as indicated. Interim medical history since our last visit reviewed. Allergies and medications reviewed and updated.  Review of Systems  Per HPI unless specifically indicated above     Objective:    BP 125/80   Pulse 82   Temp 98.1 F (36.7 C) (Oral)   Ht 5' 7.5" (1.715 m)   Wt 172 lb (78 kg)   SpO2 96%   BMI 26.54 kg/m   Wt Readings from Last 3 Encounters:  10/15/17 172 lb (78 kg)  04/16/17 172 lb 9.6 oz (78.3 kg)  11/13/16 149 lb 6.4 oz (67.8 kg)    Physical Exam  Constitutional: She is oriented to person, place, and time. She appears well-developed and well-nourished. No distress.  HENT:  Head: Atraumatic.  Eyes: Conjunctivae and EOM are normal.  Neck: Normal range of motion. Neck supple.  Cardiovascular: Normal rate  and regular rhythm.  Pulmonary/Chest: Effort normal and breath sounds normal.  Musculoskeletal: Normal range of motion.  Neurological: She is alert and oriented to person, place, and time.  Skin: Skin is warm and dry.  3 erythematous papules right upper arm, one appears to have been scratched and scabbed over. No evidence of infection currently  Psychiatric: She has a normal mood and affect. Her behavior is normal.  Nursing note and vitals reviewed.   Results for orders placed or performed in visit on 10/15/17  UA/M w/rflx Culture, Routine  Result Value Ref Range   Specific Gravity, UA 1.025 1.005 - 1.030   pH, UA 5.0 5.0 - 7.5   Color, UA Yellow Yellow   Appearance Ur Hazy (A) Clear   Leukocytes, UA Negative Negative   Protein, UA Negative Negative/Trace   Glucose, UA Negative Negative   Ketones, UA Negative Negative   RBC, UA Negative Negative   Bilirubin, UA Negative Negative   Urobilinogen, Ur 0.2 0.2 - 1.0 mg/dL   Nitrite, UA Negative Negative      Assessment & Plan:   Problem List Items Addressed This Visit      Cardiovascular and Mediastinum  Hypertension - Primary    BPs stable and WNL, continue current regimen      Relevant Orders   Comprehensive metabolic panel     Other   Hyperlipidemia    Recheck lipids, adjust as needed      Relevant Orders   Lipid Panel w/o Chol/HDL Ratio    Other Visit Diagnoses    Flu vaccine need       Relevant Orders   Flu vaccine HIGH DOSE PF (Completed)   Dysuria       Check U/A today, treat as needed. Push fluids, probiotics   Relevant Orders   UA/M w/rflx Culture, Routine (Completed)   Urine Culture   Fatigue, unspecified type       Check vit D today as well given signififcant progressive fatigue   Relevant Orders   Vitamin B12   Vitamin D (25 hydroxy)   History of non anemic vitamin B12 deficiency       Check levels, will supplement if low   Relevant Orders   Vitamin B12   Skin lesion       Alternate neosporin  and triamcinolone cream until resolved. Return precautions given for signs of infection       Follow up plan: Return in about 6 months (around 04/15/2018) for CPE.

## 2017-10-16 ENCOUNTER — Encounter: Payer: Self-pay | Admitting: Family Medicine

## 2017-10-16 ENCOUNTER — Other Ambulatory Visit: Payer: Self-pay | Admitting: Family Medicine

## 2017-10-16 LAB — COMPREHENSIVE METABOLIC PANEL
ALBUMIN: 4.3 g/dL (ref 3.6–4.8)
ALK PHOS: 55 IU/L (ref 39–117)
ALT: 32 IU/L (ref 0–32)
AST: 28 IU/L (ref 0–40)
Albumin/Globulin Ratio: 2 (ref 1.2–2.2)
BILIRUBIN TOTAL: 0.3 mg/dL (ref 0.0–1.2)
BUN / CREAT RATIO: 28 (ref 12–28)
BUN: 21 mg/dL (ref 8–27)
CHLORIDE: 97 mmol/L (ref 96–106)
CO2: 28 mmol/L (ref 20–29)
CREATININE: 0.74 mg/dL (ref 0.57–1.00)
Calcium: 10.1 mg/dL (ref 8.7–10.3)
GFR calc Af Amer: 98 mL/min/{1.73_m2} (ref >59)
GFR calc non Af Amer: 85 mL/min/{1.73_m2} (ref >59)
GLOBULIN, TOTAL: 2.2 g/dL (ref 1.5–4.5)
GLUCOSE: 75 mg/dL (ref 65–99)
Potassium: 3.8 mmol/L (ref 3.5–5.2)
SODIUM: 143 mmol/L (ref 134–144)
Total Protein: 6.5 g/dL (ref 6.0–8.5)

## 2017-10-16 LAB — LIPID PANEL W/O CHOL/HDL RATIO
CHOLESTEROL TOTAL: 171 mg/dL (ref 100–199)
HDL: 44 mg/dL (ref >39)
LDL CALC: 82 mg/dL (ref 0–99)
Triglycerides: 223 mg/dL — ABNORMAL HIGH (ref 0–149)
VLDL Cholesterol Cal: 45 mg/dL — ABNORMAL HIGH (ref 5–40)

## 2017-10-16 LAB — VITAMIN D 25 HYDROXY (VIT D DEFICIENCY, FRACTURES): Vit D, 25-Hydroxy: 46.6 ng/mL (ref 30.0–100.0)

## 2017-10-16 LAB — VITAMIN B12: Vitamin B-12: >2000 pg/mL — ABNORMAL HIGH (ref 232–1245)

## 2017-10-16 MED ORDER — HYDROXYZINE PAMOATE 25 MG PO CAPS: 25.0000 mg | ORAL_CAPSULE | Freq: Three times a day (TID) | ORAL | 3 refills | Status: DC | PRN

## 2017-10-16 NOTE — Assessment & Plan Note (Signed)
BPs stable and WNL, continue current regimen 

## 2017-10-16 NOTE — Assessment & Plan Note (Signed)
Recheck lipids, adjust as needed 

## 2017-10-16 NOTE — Patient Instructions (Signed)
Follow up in 6 months 

## 2017-10-16 NOTE — Telephone Encounter (Signed)
Visteril 25mg  refill Last Refill:04/16/17 # 90Last OV: 04/16/17 PCP: 04/16/17 [facility]

## 2017-10-16 NOTE — Telephone Encounter (Signed)
Copied from CRM [id number]. Topic: Quick Communication - Rx Refill/Question >> Oct 16, 2017 11:16 AM Baldo Daub L wrote: Medication: hydrOXYzine (VISTARIL) 25 MG capsule  Has the patient contacted their pharmacy? No - controlled substance (Agent: If no, request that the patient contact the pharmacy for the refill.) (Agent: If yes, when and what did the pharmacy advise?)  Preferred Pharmacy (with phone number or street name): [facility] - [place], [place] - [address] [phone] (Phone) [phone] (Fax)  Agent: Please be advised that RX refills may take up to 3 business days. We ask that you follow-up with your pharmacy.

## 2017-10-17 ENCOUNTER — Telehealth: Payer: Self-pay | Admitting: Family Medicine

## 2017-10-17 MED ORDER — SULFAMETHOXAZOLE-TRIMETHOPRIM 800-160 MG PO TABS: 1.0000 | ORAL_TABLET | Freq: Two times a day (BID) | ORAL | 0 refills | Status: DC

## 2017-10-17 NOTE — Telephone Encounter (Signed)
Abx sent to pharmacy, she should follow up if not better

## 2017-10-17 NOTE — Telephone Encounter (Signed)
Copied from CRM [id number]. Topic: Quick Communication - See Telephone Encounter >> Oct 17, 2017  9:39 AM Lorrine Kin, NT wrote: CRM for notification. See Telephone encounter for: 10/17/17. Patient calling and states that the bug bite on her right arm, that she mentioned at her last visit, did not get any better with the neosporin and would like to know if Roosvelt Maser could send something to the pharmacy? [facility] - [place], [place] - [address]

## 2017-10-21 NOTE — Telephone Encounter (Signed)
Patient notified

## 2017-10-27 ENCOUNTER — Other Ambulatory Visit: Payer: Self-pay | Admitting: Family Medicine

## 2017-10-27 MED ORDER — CITALOPRAM HYDROBROMIDE 40 MG PO TABS: 40.0000 mg | ORAL_TABLET | Freq: Every day | ORAL | 3 refills | Status: DC

## 2017-10-27 MED ORDER — CLONIDINE HCL 0.1 MG PO TABS: 0.1000 mg | ORAL_TABLET | Freq: Every day | ORAL | 3 refills | Status: DC

## 2017-10-27 NOTE — Telephone Encounter (Signed)
Requested Prescriptions  Pending Prescriptions Disp Refills  . citalopram (CELEXA) 40 MG tablet 90 tablet 3    Sig: Take 1 tablet (40 mg total) by mouth daily.     Psychiatry:  Antidepressants - SSRI Passed - 10/27/2017  9:03 AM      Passed - Valid encounter within last 6 months    Recent Outpatient Visits          1 week ago Essential hypertension   [facility] Roosvelt Maser [place], [place]   6 months ago Urinary frequency   [facility] Roosvelt Maser [place], [place]   11 months ago [employer] preventive visit   [facility], [place], [place]   1 year ago Encounter to establish care   [facility], [place], [place]      Future Appointments            In 3 weeks  [facility]   In 1 month [place], [place], [place] [facility], [facility]         . cloNIDine (CATAPRES) 0.1 MG tablet 90 tablet 3    Sig: Take 1 tablet (0.1 mg total) by mouth daily.     Cardiovascular:  Alpha-2 Agonists Passed - 10/27/2017  9:03 AM      Passed - Last BP in normal range    BP Readings from Last 1 Encounters:  10/15/17 125/80         Passed - Last Heart Rate in normal range    Pulse Readings from Last 1 Encounters:  10/15/17 82         Passed - Valid encounter within last 6 months    Recent Outpatient Visits          1 week ago Essential hypertension   [facility] Roosvelt Maser [place], [place]   6 months ago Urinary frequency   [facility] Roosvelt Maser [place], [place]   11 months ago [facility] [employer] preventive visit   [facility], [place], [place]   1 year ago Encounter to establish care   [facility], [place], [place]      Future Appointments            In 3 weeks  [facility], [facility]   In 1 month [place], [place], PA-C [employer], [facility]

## 2017-10-27 NOTE — Telephone Encounter (Signed)
Copied from CRM [id number]. Topic: Quick Communication - Rx Refill/Question >> Oct 27, 2017  8:33 AM Elliot Gault wrote: Relation to pt: self  Call back number: [phone] Pharmacy: [facility] - [place], [place] - [address] [phone] ([phone] (Fax)  Reason for call:  Patient requesting citalopram (CELEXA) 40 MG tablet and cloNIDine (CATAPRES) 0.1 MG tablet, patient contacted pharmacy, patient informed please allow 48 to 72 hour turn around. Patient seeking refill to hold her over until 11/26/17 follow up appt with PCP, please advise

## 2017-10-27 NOTE — Addendum Note (Signed)
Addended by: Redmond Baseman on: 10/27/2017 09:13 AM   Modules accepted: Orders

## 2017-10-27 NOTE — Addendum Note (Signed)
Addended by: Redmond Baseman on: 10/27/2017 09:03 AM   Modules accepted: Orders

## 2017-11-17 ENCOUNTER — Ambulatory Visit: Payer: Medicare Other

## 2017-11-26 ENCOUNTER — Ambulatory Visit: Payer: Medicare Other | Admitting: Family Medicine

## 2017-12-03 ENCOUNTER — Encounter: Payer: Self-pay | Admitting: Family Medicine

## 2017-12-03 ENCOUNTER — Ambulatory Visit (INDEPENDENT_AMBULATORY_CARE_PROVIDER_SITE_OTHER): Payer: Medicare Other | Admitting: Family Medicine

## 2017-12-03 VITALS — BP 146/83 | HR 74 | Temp 98.5°F | Ht 64.0 in | Wt 184.0 lb

## 2017-12-03 DIAGNOSIS — I10 Essential (primary) hypertension: Secondary | ICD-10-CM | POA: Diagnosis not present

## 2017-12-03 DIAGNOSIS — Z1159 Encounter for screening for other viral diseases: Secondary | ICD-10-CM | POA: Diagnosis not present

## 2017-12-03 DIAGNOSIS — Z23 Encounter for immunization: Secondary | ICD-10-CM | POA: Diagnosis not present

## 2017-12-03 DIAGNOSIS — E782 Mixed hyperlipidemia: Secondary | ICD-10-CM

## 2017-12-03 DIAGNOSIS — F419 Anxiety disorder, unspecified: Secondary | ICD-10-CM

## 2017-12-03 DIAGNOSIS — Z0000 Encounter for general adult medical examination without abnormal findings: Secondary | ICD-10-CM

## 2017-12-03 DIAGNOSIS — M5431 Sciatica, right side: Secondary | ICD-10-CM | POA: Diagnosis not present

## 2017-12-03 DIAGNOSIS — G4700 Insomnia, unspecified: Secondary | ICD-10-CM | POA: Diagnosis not present

## 2017-12-03 DIAGNOSIS — Z13820 Encounter for screening for osteoporosis: Secondary | ICD-10-CM | POA: Diagnosis not present

## 2017-12-03 MED ORDER — TRIAMCINOLONE ACETONIDE 40 MG/ML IJ SUSP
40.0000 mg | Freq: Once | INTRAMUSCULAR | Status: AC
  Administered 2017-12-03: 40 mg via INTRAMUSCULAR

## 2017-12-03 MED ORDER — SUVOREXANT 10 MG PO TABS: 10.0000 mg | ORAL_TABLET | Freq: Every evening | ORAL | 0 refills | Status: DC | PRN

## 2017-12-03 NOTE — Progress Notes (Signed)
BP (!) 146/83   Pulse 74   Temp 98.5 F (36.9 C) (Oral)   Ht 5\' 4"  (1.626 m)   Wt 184 lb (83.5 kg)   SpO2 95%   BMI 31.58 kg/m    Subjective:    Patient ID: Marcia [name], female    DOB: 02/10/1950, 67 y.o.   MRN: [id number]  HPI: Marcia [name] is a 67 y.o. female presenting on 12/03/2017 for comprehensive medical examination. Current medical complaints include:see below  Ongoing difficulty sleeping is her main concern. Has tried some OTC remedies with no relief. Has never had anything prescription strength for sleep. Can typically fall asleep, but wakes after a few hours and can't fall back to sleep.   Taking hydroxyzine prn and celexa daily for anxiety, not having to use the prn often. Works well. Denies SI/HI.   Depression screen [facility] 2/9 12/03/2017 10/15/2017 07/17/2016  Decreased Interest 3 0 0  Down, Depressed, Hopeless 3 0 0  PHQ - 2 Score 6 0 0  Altered sleeping 0 0 -  Tired, decreased energy 2 0 -  Change in appetite 2 0 -  Feeling bad or failure about yourself  0 0 -  Trouble concentrating 0 0 -  Moving slowly or fidgety/restless 0 0 -  Suicidal thoughts 0 0 -  PHQ-9 Score 10 0 -    Sciatica right leg, had it flare up about 4 years ago and had a steroid injection.   She currently lives with: Menopausal Symptoms: no  Depression Screen done today and results listed below:  Depression screen [facility] 2/9 12/03/2017 10/15/2017 07/17/2016  Decreased Interest 3 0 0  Down, Depressed, Hopeless 3 0 0  PHQ - 2 Score 6 0 0  Altered sleeping 0 0 -  Tired, decreased energy 2 0 -  Change in appetite 2 0 -  Feeling bad or failure about yourself  0 0 -  Trouble concentrating 0 0 -  Moving slowly or fidgety/restless 0 0 -  Suicidal thoughts 0 0 -  PHQ-9 Score 10 0 -   GAD 7 : Generalized Anxiety Score 12/03/2017 10/15/2017  Nervous, Anxious, on Edge 0 0  Control/stop worrying 0 0  Worry too much - different things 0 0  Trouble relaxing 0 0  Restless 0 0  Easily annoyed  or irritable 0 0  Afraid - awful might happen 0 0  Total GAD 7 Score 0 0     The patient does not have a history of falls. I did not complete a risk assessment for falls. A plan of care for falls was not documented.   Past Medical History:  Past Medical History:  Diagnosis Date  . Anxiety   . Hyperlipidemia   . Hypertension     Surgical History:  Past Surgical History:  Procedure Laterality Date  . BREAST BIOPSY Left 2015   CORE W/CLIP - NEG  . BREAST BIOPSY Left 07/2007   neg bx   . CESAREAN SECTION    . COLON SURGERY    . LAPAROTOMY N/A 12/06/2017   Procedure: EXPLORATORY LAPAROTOMY, sigmoid colectomy, anastomosis;  Surgeon: Ancil Linsey, MD;  Location: [facility] ORS;  Service: General;  Laterality: N/A;  . TUBAL LIGATION      Medications:  No current facility-administered medications on file prior to visit.    Current Outpatient Medications on File Prior to Visit  Medication Sig  . citalopram (CELEXA) 40 MG tablet Take 1 tablet (40 mg total) by mouth  daily.  . cloNIDine (CATAPRES) 0.1 MG tablet Take 1 tablet (0.1 mg total) by mouth daily.  [name] gabapentin (NEURONTIN) 300 MG capsule Take 2 capsules (600 mg total) by mouth at bedtime.  . hydrochlorothiazide (HYDRODIURIL) 25 MG tablet Take 1 tablet (25 mg total) by mouth daily.  . hydrOXYzine (VISTARIL) 25 MG capsule Take 1 capsule (25 mg total) by mouth 3 (three) times daily as needed.  . pravastatin (PRAVACHOL) 40 MG tablet Take 1 tablet (40 mg total) by mouth daily.  [name] triamcinolone cream (KENALOG) 0.1 % Apply 1 application topically 2 (two) times daily.    Allergies:  No Known Allergies  Social History:  Social History   Socioeconomic History  . Marital status: Single    Spouse name: Not on file  . Number of children: Not on file  . Years of education: Not on file  . Highest education level: Not on file  Occupational History  . Not on file  Social Needs  . Financial resource strain: Not on file  . Food  insecurity:    Worry: Not on file    Inability: Not on file  . Transportation needs:    Medical: Not on file    Non-medical: Not on file  Tobacco Use  . Smoking status: Never Smoker  . Smokeless tobacco: Former Engineer, water and Sexual Activity  . Alcohol use: No  . Drug use: No  . Sexual activity: Not on file  Lifestyle  . Physical activity:    Days per week: Not on file    Minutes per session: Not on file  . Stress: Not on file  Relationships  . Social connections:    Talks on phone: Not on file    Gets together: Not on file    Attends religious service: Not on file    Active member of club or organization: Not on file    Attends meetings of clubs or organizations: Not on file    Relationship status: Not on file  . Intimate partner violence:    Fear of current or ex partner: Not on file    Emotionally abused: Not on file    Physically abused: Not on file    Forced sexual activity: Not on file  Other Topics Concern  . Not on file  Social History Narrative  . Not on file   Social History   Tobacco Use  Smoking Status Never Smoker  Smokeless Tobacco Former Neurosurgeon   Social History   Substance and Sexual Activity  Alcohol Use No    Family History:  Family History  Problem Relation Age of Onset  . Breast cancer Mother   . Liver cancer Sister   . Pancreatic cancer Sister   . Stomach cancer Brother   . Lung cancer Brother   . Brain cancer Sister   . Diabetes Father   . Heart disease Father   . COPD Neg Hx   . Stroke Neg Hx     Past medical history, surgical history, medications, allergies, family history and social history reviewed with patient today and changes made to appropriate areas of the chart.   Review of Systems - General ROS: positive for  - sleep disturbance Psychological ROS: negative Ophthalmic ROS: negative ENT ROS: negative Allergy and Immunology ROS: negative Hematological and Lymphatic ROS: negative Endocrine ROS: negative Breast ROS:  negative for breast lumps Respiratory ROS: no cough, shortness of breath, or wheezing Cardiovascular ROS: no chest pain or dyspnea on exertion Gastrointestinal ROS:  no abdominal pain, change in bowel habits, or black or bloody stools Genito-Urinary ROS: no dysuria, trouble voiding, or hematuria Musculoskeletal ROS: positive for - sciatica Neurological ROS: no TIA or stroke symptoms Dermatological ROS: negative All other ROS negative except what is listed above and in the HPI.      Objective:    BP (!) 146/83   Pulse 74   Temp 98.5 F (36.9 C) (Oral)   Ht 5\' 4"  (1.626 m)   Wt 184 lb (83.5 kg)   SpO2 95%   BMI 31.58 kg/m   Wt Readings from Last 3 Encounters:  12/06/17 180 lb (81.6 kg)  12/03/17 184 lb (83.5 kg)  10/15/17 172 lb (78 kg)    Physical Exam  Constitutional: She is oriented to person, place, and time. She appears well-developed and well-nourished. No distress.  HENT:  Head: Atraumatic.  Right Ear: External ear normal.  Left Ear: External ear normal.  Nose: Nose normal.  Mouth/Throat: Oropharynx is clear and moist. No oropharyngeal exudate.  Eyes: Pupils are equal, round, and reactive to light. Conjunctivae are normal. No scleral icterus.  Neck: Normal range of motion. Neck supple. No thyromegaly present.  Cardiovascular: Normal rate, regular rhythm, normal heart sounds and intact distal pulses.  Pulmonary/Chest: Effort normal and breath sounds normal. No respiratory distress. Right breast exhibits no mass, no nipple discharge, no skin change and no tenderness. Left breast exhibits no mass, no nipple discharge, no skin change and no tenderness.  Abdominal: Soft. Bowel sounds are normal. She exhibits no mass. There is no tenderness.  Musculoskeletal: Normal range of motion. She exhibits no edema or tenderness.  Lymphadenopathy:    She has no cervical adenopathy.    She has no axillary adenopathy.  Neurological: She is alert and oriented to person, place, and time.  No cranial nerve deficit.  Skin: Skin is warm and dry. No rash noted.  Psychiatric: She has a normal mood and affect. Her behavior is normal.  Nursing note and vitals reviewed.   Results for orders placed or performed in visit on 12/03/17  Hepatitis C antibody  Result Value Ref Range   Hep C Virus Ab 0.2 0.0 - 0.9 s/co ratio      Assessment & Plan:   Problem List Items Addressed This Visit      Cardiovascular and Mediastinum   Hypertension    Above goal today but typically WNL. Will continue to monitor and adjust if continuing to be elevated. DASH diet, increase exercise        Other   Hyperlipidemia    Recheck lipids, adjust as needed. Continue good lifestyle modifications      Anxiety    Stable on celexa and prn hydroxyzine. Continue current regimen      Insomnia    Trial belsomra, sleep hygiene reviewed       Other Visit Diagnoses    Right sided sciatica    -  Primary   Stretches, massage, epsom salt soaks reviewed. IM kenalog given today. Can take tylenol or ibuprofen prn.    Relevant Medications   triamcinolone acetonide (KENALOG-40) injection 40 mg (Completed)   Suvorexant (BELSOMRA) 10 MG TABS   Annual physical exam       Need for pneumococcal vaccination       Relevant Orders   Pneumococcal polysaccharide vaccine 23-valent greater than or equal to 2yo subcutaneous/IM (Completed)   Screening for osteoporosis       Relevant Orders   DG BONE DENSITY (DXA)  Need for hepatitis C screening test       Relevant Orders   Hepatitis C antibody (Completed)       Follow up plan: Return in about 6 months (around 06/03/2018) for 6 month f/u.   LABORATORY TESTING:  - Pap smear: not applicable  IMMUNIZATIONS:   - Tdap: Tetanus vaccination status reviewed: last tetanus booster within 10 years. - Influenza: Up to date - Pneumovax: Up to date - Prevnar: Up to date - HPV: Not applicable - Zostavax vaccine: Refused  SCREENING: -Mammogram: Up to date  -  Colonoscopy: Refused  - Bone Density: Ordered today   PATIENT COUNSELING:   Advised to take 1 mg of folate supplement per day if capable of pregnancy.   Sexuality: Discussed sexually transmitted diseases, partner selection, use of condoms, avoidance of unintended pregnancy  and contraceptive alternatives.   Advised to avoid cigarette smoking.  I discussed with the patient that most people either abstain from alcohol or drink within safe limits (<=14/week and <=4 drinks/occasion for males, <=7/weeks and <= 3 drinks/occasion for females) and that the risk for alcohol disorders and other health effects rises proportionally with the number of drinks per week and how often a drinker exceeds daily limits.  Discussed cessation/primary prevention of drug use and availability of treatment for abuse.   Diet: Encouraged to adjust caloric intake to maintain  or achieve ideal body weight, to reduce intake of dietary saturated fat and total fat, to limit sodium intake by avoiding high sodium foods and not adding table salt, and to maintain adequate dietary potassium and calcium preferably from fresh fruits, vegetables, and low-fat dairy products.    stressed the importance of regular exercise  Injury prevention: Discussed safety belts, safety helmets, smoke detector, smoking near bedding or upholstery.   Dental health: Discussed importance of regular tooth brushing, flossing, and dental visits.    NEXT PREVENTATIVE PHYSICAL DUE IN 1 YEAR. Return in about 6 months (around 06/03/2018) for 6 month f/u.

## 2017-12-03 NOTE — Patient Instructions (Signed)
Go to the [employer] website and click on "special offers" Click the second option that says "free 10 day trial" Follow prompts and take voucher to pharmacy

## 2017-12-04 ENCOUNTER — Encounter: Payer: Self-pay | Admitting: Family Medicine

## 2017-12-04 LAB — HEPATITIS C ANTIBODY: Hep C Virus Ab: 0.2 s/co ratio (ref 0.0–0.9)

## 2017-12-06 ENCOUNTER — Emergency Department: Payer: Medicare Other

## 2017-12-06 ENCOUNTER — Inpatient Hospital Stay: Payer: Medicare Other | Admitting: Anesthesiology

## 2017-12-06 ENCOUNTER — Encounter
Admission: EM | Disposition: A | Discharge status: Home / Self Care | Payer: Self-pay | Source: Home / Self Care | Attending: Surgery

## 2017-12-06 ENCOUNTER — Encounter: Payer: Self-pay | Admitting: Radiology

## 2017-12-06 ENCOUNTER — Other Ambulatory Visit: Payer: Self-pay

## 2017-12-06 ENCOUNTER — Inpatient Hospital Stay
Admission: EM | Admit: 2017-12-06 | Discharge: 2017-12-11 | DRG: 331 | Disposition: A | Discharge status: Home / Self Care | Payer: Medicare Other | Attending: Surgery | Admitting: Surgery

## 2017-12-06 DIAGNOSIS — R109 Unspecified abdominal pain: Secondary | ICD-10-CM | POA: Diagnosis not present

## 2017-12-06 DIAGNOSIS — K631 Perforation of intestine (nontraumatic): Secondary | ICD-10-CM

## 2017-12-06 DIAGNOSIS — K668 Other specified disorders of peritoneum: Secondary | ICD-10-CM

## 2017-12-06 DIAGNOSIS — E785 Hyperlipidemia, unspecified: Secondary | ICD-10-CM | POA: Diagnosis not present

## 2017-12-06 DIAGNOSIS — F17290 Nicotine dependence, other tobacco product, uncomplicated: Secondary | ICD-10-CM | POA: Diagnosis present

## 2017-12-06 DIAGNOSIS — K449 Diaphragmatic hernia without obstruction or gangrene: Secondary | ICD-10-CM | POA: Diagnosis not present

## 2017-12-06 DIAGNOSIS — Z800 Family history of malignant neoplasm of digestive organs: Secondary | ICD-10-CM

## 2017-12-06 DIAGNOSIS — I10 Essential (primary) hypertension: Secondary | ICD-10-CM | POA: Diagnosis present

## 2017-12-06 DIAGNOSIS — Z801 Family history of malignant neoplasm of trachea, bronchus and lung: Secondary | ICD-10-CM

## 2017-12-06 DIAGNOSIS — K659 Peritonitis, unspecified: Secondary | ICD-10-CM

## 2017-12-06 DIAGNOSIS — Z8249 Family history of ischemic heart disease and other diseases of the circulatory system: Secondary | ICD-10-CM | POA: Diagnosis not present

## 2017-12-06 DIAGNOSIS — Z833 Family history of diabetes mellitus: Secondary | ICD-10-CM

## 2017-12-06 DIAGNOSIS — F419 Anxiety disorder, unspecified: Secondary | ICD-10-CM | POA: Diagnosis present

## 2017-12-06 DIAGNOSIS — R1084 Generalized abdominal pain: Secondary | ICD-10-CM

## 2017-12-06 DIAGNOSIS — Z803 Family history of malignant neoplasm of breast: Secondary | ICD-10-CM

## 2017-12-06 DIAGNOSIS — F329 Major depressive disorder, single episode, unspecified: Secondary | ICD-10-CM | POA: Diagnosis present

## 2017-12-06 DIAGNOSIS — R1030 Lower abdominal pain, unspecified: Secondary | ICD-10-CM | POA: Diagnosis not present

## 2017-12-06 DIAGNOSIS — Z808 Family history of malignant neoplasm of other organs or systems: Secondary | ICD-10-CM | POA: Diagnosis not present

## 2017-12-06 DIAGNOSIS — R1900 Intra-abdominal and pelvic swelling, mass and lump, unspecified site: Secondary | ICD-10-CM | POA: Diagnosis not present

## 2017-12-06 DIAGNOSIS — K5641 Fecal impaction: Secondary | ICD-10-CM | POA: Diagnosis not present

## 2017-12-06 DIAGNOSIS — K760 Fatty (change of) liver, not elsewhere classified: Secondary | ICD-10-CM | POA: Diagnosis not present

## 2017-12-06 DIAGNOSIS — K56600 Partial intestinal obstruction, unspecified as to cause: Secondary | ICD-10-CM | POA: Diagnosis not present

## 2017-12-06 HISTORY — DX: Perforation of intestine (nontraumatic): K63.1

## 2017-12-06 HISTORY — PX: LAPAROTOMY: SHX154

## 2017-12-06 LAB — CBC
HEMATOCRIT: 45 % (ref 36.0–46.0)
HEMOGLOBIN: 15.5 g/dL — AB (ref 12.0–15.0)
MCH: 30 pg (ref 26.0–34.0)
MCHC: 34.4 g/dL (ref 30.0–36.0)
MCV: 87.2 fL (ref 80.0–100.0)
Platelets: 311 10*3/uL (ref 150–400)
RBC: 5.16 MIL/uL — ABNORMAL HIGH (ref 3.87–5.11)
RDW: 14.6 % (ref 11.5–15.5)
WBC: 16.7 10*3/uL — AB (ref 4.0–10.5)
nRBC: 0 % (ref 0.0–0.2)

## 2017-12-06 LAB — LIPASE, BLOOD: LIPASE: 19 U/L (ref 11–51)

## 2017-12-06 LAB — COMPREHENSIVE METABOLIC PANEL
ALT: 42 U/L (ref 0–44)
ANION GAP: 14 (ref 5–15)
AST: 32 U/L (ref 15–41)
Albumin: 3.8 g/dL (ref 3.5–5.0)
Alkaline Phosphatase: 45 U/L (ref 38–126)
BUN: 27 mg/dL — ABNORMAL HIGH (ref 8–23)
CALCIUM: 8.9 mg/dL (ref 8.9–10.3)
CHLORIDE: 98 mmol/L (ref 98–111)
CO2: 27 mmol/L (ref 22–32)
Creatinine, Ser: 0.77 mg/dL (ref 0.44–1.00)
GFR calc non Af Amer: >60 mL/min (ref >60)
Glucose, Bld: 185 mg/dL — ABNORMAL HIGH (ref 70–99)
Potassium: 3.3 mmol/L — ABNORMAL LOW (ref 3.5–5.1)
SODIUM: 139 mmol/L (ref 135–145)
Total Bilirubin: 1.4 mg/dL — ABNORMAL HIGH (ref 0.3–1.2)
Total Protein: 6.8 g/dL (ref 6.5–8.1)

## 2017-12-06 LAB — AMMONIA: AMMONIA: 18 umol/L (ref 9–35)

## 2017-12-06 SURGERY — LAPAROTOMY, EXPLORATORY
Anesthesia: General | Site: Abdomen

## 2017-12-06 MED ORDER — ONDANSETRON HCL 4 MG/2ML IJ SOLN
INTRAMUSCULAR | Status: DC | PRN
  Administered 2017-12-06: 4 mg via INTRAVENOUS

## 2017-12-06 MED ORDER — SUGAMMADEX SODIUM 200 MG/2ML IV SOLN
INTRAVENOUS | Status: DC | PRN
  Administered 2017-12-06: 200 mg via INTRAVENOUS

## 2017-12-06 MED ORDER — ENOXAPARIN SODIUM 40 MG/0.4ML ~~LOC~~ SOLN
40.0000 mg | SUBCUTANEOUS | Status: DC
  Administered 2017-12-07 – 2017-12-11 (×5): 40 mg via SUBCUTANEOUS
  Filled 2017-12-06 (×5): qty 0.4

## 2017-12-06 MED ORDER — LIDOCAINE HCL (PF) 2 % IJ SOLN
INTRAMUSCULAR | Status: AC
  Filled 2017-12-06: qty 10

## 2017-12-06 MED ORDER — CHLORHEXIDINE GLUCONATE CLOTH 2 % EX PADS
6.0000 | MEDICATED_PAD | Freq: Once | CUTANEOUS | Status: DC
  Filled 2017-12-06: qty 6

## 2017-12-06 MED ORDER — SODIUM CHLORIDE 0.9 % IV BOLUS
1000.0000 mL | Freq: Once | INTRAVENOUS | Status: AC
  Administered 2017-12-06: 1000 mL via INTRAVENOUS

## 2017-12-06 MED ORDER — PROPOFOL 10 MG/ML IV BOLUS
INTRAVENOUS | Status: AC
  Filled 2017-12-06: qty 20

## 2017-12-06 MED ORDER — DEXTROSE IN LACTATED RINGERS 5 % IV SOLN
INTRAVENOUS | Status: DC
  Administered 2017-12-07 – 2017-12-09 (×8): via INTRAVENOUS

## 2017-12-06 MED ORDER — ONDANSETRON HCL 4 MG/2ML IJ SOLN
INTRAMUSCULAR | Status: AC
  Filled 2017-12-06: qty 2

## 2017-12-06 MED ORDER — KETOROLAC TROMETHAMINE 30 MG/ML IJ SOLN
INTRAMUSCULAR | Status: DC | PRN
  Administered 2017-12-06: 15 mg via INTRAVENOUS

## 2017-12-06 MED ORDER — KETOROLAC TROMETHAMINE 15 MG/ML IJ SOLN
15.0000 mg | Freq: Four times a day (QID) | INTRAMUSCULAR | Status: DC
  Administered 2017-12-07 – 2017-12-11 (×18): 15 mg via INTRAVENOUS
  Filled 2017-12-06 (×18): qty 1

## 2017-12-06 MED ORDER — FENTANYL CITRATE (PF) 100 MCG/2ML IJ SOLN: 25.0000 ug | INTRAMUSCULAR | Status: DC | PRN

## 2017-12-06 MED ORDER — 0.9 % SODIUM CHLORIDE (POUR BTL) OPTIME
TOPICAL | Status: DC | PRN
  Administered 2017-12-06: 2000 mL

## 2017-12-06 MED ORDER — PIPERACILLIN-TAZOBACTAM 3.375 G IVPB 30 MIN
3.3750 g | Freq: Once | INTRAVENOUS | Status: AC
  Administered 2017-12-06: 3.375 g via INTRAVENOUS
  Filled 2017-12-06: qty 50

## 2017-12-06 MED ORDER — SUCCINYLCHOLINE CHLORIDE 20 MG/ML IJ SOLN
INTRAMUSCULAR | Status: DC | PRN
  Administered 2017-12-06: 120 mg via INTRAVENOUS

## 2017-12-06 MED ORDER — ACETAMINOPHEN 500 MG PO TABS
ORAL_TABLET | ORAL | Status: AC
  Administered 2017-12-06: 1000 mg via ORAL
  Filled 2017-12-06: qty 2

## 2017-12-06 MED ORDER — FENTANYL CITRATE (PF) 100 MCG/2ML IJ SOLN
INTRAMUSCULAR | Status: AC
  Filled 2017-12-06: qty 2

## 2017-12-06 MED ORDER — KETOROLAC TROMETHAMINE 30 MG/ML IJ SOLN
INTRAMUSCULAR | Status: AC
  Filled 2017-12-06: qty 1

## 2017-12-06 MED ORDER — DEXAMETHASONE SODIUM PHOSPHATE 10 MG/ML IJ SOLN
INTRAMUSCULAR | Status: AC
  Filled 2017-12-06: qty 1

## 2017-12-06 MED ORDER — ONDANSETRON HCL 4 MG/2ML IJ SOLN: 4.0000 mg | Freq: Once | INTRAMUSCULAR | Status: DC | PRN

## 2017-12-06 MED ORDER — OXYCODONE HCL 5 MG PO TABS
5.0000 mg | ORAL_TABLET | ORAL | Status: DC | PRN
  Administered 2017-12-07: 5 mg via ORAL
  Administered 2017-12-08: 10 mg via ORAL
  Administered 2017-12-09: 5 mg via ORAL
  Filled 2017-12-06: qty 1
  Filled 2017-12-06 (×2): qty 2

## 2017-12-06 MED ORDER — ROCURONIUM BROMIDE 50 MG/5ML IV SOLN
INTRAVENOUS | Status: AC
  Filled 2017-12-06: qty 1

## 2017-12-06 MED ORDER — PIPERACILLIN-TAZOBACTAM 3.375 G IVPB
3.3750 g | Freq: Three times a day (TID) | INTRAVENOUS | Status: DC
  Administered 2017-12-06 – 2017-12-09 (×8): 3.375 g via INTRAVENOUS
  Filled 2017-12-06 (×7): qty 50

## 2017-12-06 MED ORDER — LIDOCAINE HCL (CARDIAC) PF 100 MG/5ML IV SOSY
PREFILLED_SYRINGE | INTRAVENOUS | Status: DC | PRN
  Administered 2017-12-06: 50 mg via INTRAVENOUS

## 2017-12-06 MED ORDER — BUPIVACAINE LIPOSOME 1.3 % IJ SUSP: 20.0000 mL | Freq: Once | INTRAMUSCULAR | Status: DC

## 2017-12-06 MED ORDER — ONDANSETRON 4 MG PO TBDP
4.0000 mg | ORAL_TABLET | Freq: Four times a day (QID) | ORAL | Status: DC | PRN
  Administered 2017-12-10: 4 mg via ORAL
  Filled 2017-12-06: qty 1

## 2017-12-06 MED ORDER — FAMOTIDINE IN NACL 20-0.9 MG/50ML-% IV SOLN
20.0000 mg | Freq: Two times a day (BID) | INTRAVENOUS | Status: DC
  Administered 2017-12-07 – 2017-12-08 (×3): 20 mg via INTRAVENOUS
  Filled 2017-12-06 (×3): qty 50

## 2017-12-06 MED ORDER — BUPIVACAINE HCL (PF) 0.5 % IJ SOLN
INTRAMUSCULAR | Status: DC | PRN
  Administered 2017-12-06: 30 mL

## 2017-12-06 MED ORDER — ACETAMINOPHEN 500 MG PO TABS
1000.0000 mg | ORAL_TABLET | ORAL | Status: AC
  Administered 2017-12-06: 1000 mg via ORAL

## 2017-12-06 MED ORDER — PHENYLEPHRINE HCL 10 MG/ML IJ SOLN
INTRAMUSCULAR | Status: DC | PRN
  Administered 2017-12-06 (×3): 100 ug via INTRAVENOUS

## 2017-12-06 MED ORDER — BUPIVACAINE HCL (PF) 0.5 % IJ SOLN
INTRAMUSCULAR | Status: AC
  Filled 2017-12-06: qty 30

## 2017-12-06 MED ORDER — BUPIVACAINE LIPOSOME 1.3 % IJ SUSP
INTRAMUSCULAR | Status: DC | PRN
  Administered 2017-12-06: 20 mL

## 2017-12-06 MED ORDER — SEVOFLURANE IN SOLN
RESPIRATORY_TRACT | Status: AC
  Filled 2017-12-06: qty 250

## 2017-12-06 MED ORDER — MORPHINE SULFATE (PF) 2 MG/ML IV SOLN
2.0000 mg | INTRAVENOUS | Status: DC | PRN
  Administered 2017-12-07: 2 mg via INTRAVENOUS
  Filled 2017-12-06: qty 1

## 2017-12-06 MED ORDER — ONDANSETRON HCL 4 MG/2ML IJ SOLN
4.0000 mg | Freq: Four times a day (QID) | INTRAMUSCULAR | Status: DC | PRN
  Administered 2017-12-09: 4 mg via INTRAVENOUS
  Filled 2017-12-06: qty 2

## 2017-12-06 MED ORDER — MIDAZOLAM HCL 2 MG/2ML IJ SOLN
INTRAMUSCULAR | Status: DC | PRN
  Administered 2017-12-06: 2 mg via INTRAVENOUS

## 2017-12-06 MED ORDER — PROPOFOL 10 MG/ML IV BOLUS
INTRAVENOUS | Status: DC | PRN
  Administered 2017-12-06: 50 mg via INTRAVENOUS
  Administered 2017-12-06: 140 mg via INTRAVENOUS

## 2017-12-06 MED ORDER — MIDAZOLAM HCL 2 MG/2ML IJ SOLN
INTRAMUSCULAR | Status: AC
  Filled 2017-12-06: qty 2

## 2017-12-06 MED ORDER — SUCCINYLCHOLINE CHLORIDE 20 MG/ML IJ SOLN
INTRAMUSCULAR | Status: AC
  Filled 2017-12-06: qty 1

## 2017-12-06 MED ORDER — ALVIMOPAN 12 MG PO CAPS: 12.0000 mg | ORAL_CAPSULE | ORAL | Status: DC

## 2017-12-06 MED ORDER — POTASSIUM CHLORIDE 10 MEQ/100ML IV SOLN
10.0000 meq | INTRAVENOUS | Status: AC
  Administered 2017-12-06 (×2): 10 meq via INTRAVENOUS
  Filled 2017-12-06 (×2): qty 100

## 2017-12-06 MED ORDER — LACTATED RINGERS IV SOLN
INTRAVENOUS | Status: DC | PRN
  Administered 2017-12-06 (×2): via INTRAVENOUS

## 2017-12-06 MED ORDER — PIPERACILLIN-TAZOBACTAM 3.375 G IVPB
INTRAVENOUS | Status: AC
  Filled 2017-12-06: qty 50

## 2017-12-06 MED ORDER — IOHEXOL 300 MG/ML  SOLN
100.0000 mL | Freq: Once | INTRAMUSCULAR | Status: AC | PRN
  Administered 2017-12-06: 100 mL via INTRAVENOUS

## 2017-12-06 MED ORDER — DEXAMETHASONE SODIUM PHOSPHATE 10 MG/ML IJ SOLN
INTRAMUSCULAR | Status: DC | PRN
  Administered 2017-12-06: 5 mg via INTRAVENOUS

## 2017-12-06 MED ORDER — BUPIVACAINE LIPOSOME 1.3 % IJ SUSP
INTRAMUSCULAR | Status: AC
  Filled 2017-12-06: qty 20

## 2017-12-06 MED ORDER — ACETAMINOPHEN 500 MG PO TABS
1000.0000 mg | ORAL_TABLET | Freq: Four times a day (QID) | ORAL | Status: DC
  Administered 2017-12-07 – 2017-12-11 (×14): 1000 mg via ORAL
  Filled 2017-12-06 (×16): qty 2

## 2017-12-06 MED ORDER — ROCURONIUM BROMIDE 100 MG/10ML IV SOLN
INTRAVENOUS | Status: DC | PRN
  Administered 2017-12-06: 15 mg via INTRAVENOUS
  Administered 2017-12-06: 10 mg via INTRAVENOUS
  Administered 2017-12-06: 40 mg via INTRAVENOUS

## 2017-12-06 MED ORDER — LACTATED RINGERS IV SOLN: INTRAVENOUS | Status: DC | PRN

## 2017-12-06 MED ORDER — FENTANYL CITRATE (PF) 100 MCG/2ML IJ SOLN
INTRAMUSCULAR | Status: DC | PRN
  Administered 2017-12-06 (×3): 50 ug via INTRAVENOUS

## 2017-12-06 SURGICAL SUPPLY — 57 items
APPLIER CLIP 11 MED OPEN (CLIP)
APPLIER CLIP 13 LRG OPEN (CLIP)
BAG BILE T-TUBES STRL (MISCELLANEOUS) IMPLANT
BARRIER SKIN 2 3/4 (OSTOMY) IMPLANT
BARRIER SKIN 2 3/4 INCH (OSTOMY)
BULB RESERV EVAC DRAIN JP 100C (MISCELLANEOUS) ×3 IMPLANT
CHLORAPREP W/TINT 26ML (MISCELLANEOUS) ×3 IMPLANT
CLAMP POUCH DRAINAGE QUIET (OSTOMY) IMPLANT
CLIP APPLIE 11 MED OPEN (CLIP) IMPLANT
CLIP APPLIE 13 LRG OPEN (CLIP) IMPLANT
COVER WAND RF STERILE (DRAPES) ×3 IMPLANT
DRAIN CHANNEL JP 15F RND 16 (MISCELLANEOUS) ×3 IMPLANT
DRAPE LAPAROTOMY 100X77 ABD (DRAPES) ×3 IMPLANT
DRSG OPSITE POSTOP 4X10 (GAUZE/BANDAGES/DRESSINGS) ×3 IMPLANT
ELECT BLADE 6 FLAT ULTRCLN (ELECTRODE) ×3 IMPLANT
ELECT REM PT RETURN 9FT ADLT (ELECTROSURGICAL) ×3
ELECTRODE REM PT RTRN 9FT ADLT (ELECTROSURGICAL) ×1 IMPLANT
GAUZE SPONGE 4X4 12PLY STRL (GAUZE/BANDAGES/DRESSINGS) ×3 IMPLANT
GLOVE BIOGEL PI IND STRL 7.0 (GLOVE) ×6 IMPLANT
GLOVE BIOGEL PI IND STRL 7.5 (GLOVE) ×5 IMPLANT
GLOVE BIOGEL PI INDICATOR 7.0 (GLOVE) ×12
GLOVE BIOGEL PI INDICATOR 7.5 (GLOVE) ×10
GLOVE ECLIPSE 7.0 STRL STRAW (GLOVE) IMPLANT
GLOVE PROTEXIS LATEX SZ 7.5 (GLOVE) ×9 IMPLANT
GOWN STRL REUS W/TWL LRG LVL3 (GOWN DISPOSABLE) ×9 IMPLANT
HANDLE SUCTION POOLE (INSTRUMENTS) ×1 IMPLANT
KIT TURNOVER KIT A (KITS) ×3 IMPLANT
NEEDLE HYPO 22GX1.5 SAFETY (NEEDLE) ×3 IMPLANT
NS IRRIG 1000ML POUR BTL (IV SOLUTION) ×3 IMPLANT
PACK BASIN MAJOR ARMC (MISCELLANEOUS) ×3 IMPLANT
PACK COLON CLEAN CLOSURE (MISCELLANEOUS) ×3 IMPLANT
RELOAD LINEAR CUT PROX 55 BLUE (ENDOMECHANICALS) IMPLANT
RELOAD PROXIMATE 75MM BLUE (ENDOMECHANICALS) ×12 IMPLANT
RETRACTOR WND ALEXIS-O 25 LRG (MISCELLANEOUS) ×1 IMPLANT
RETRACTOR WOUND ALXS 18CM MED (MISCELLANEOUS) IMPLANT
RTRCTR WOUND ALEXIS O 18CM MED (MISCELLANEOUS)
RTRCTR WOUND ALEXIS O 25CM LRG (MISCELLANEOUS) ×3
STAPLER GUN LINEAR PROX 60 (STAPLE) IMPLANT
STAPLER PROXIMATE 55 BLUE (STAPLE) IMPLANT
STAPLER PROXIMATE 75MM BLUE (STAPLE) ×3 IMPLANT
SUCTION POOLE HANDLE (INSTRUMENTS) ×3
SUT CHROMIC 0 SH (SUTURE) IMPLANT
SUT CHROMIC 2 0 SH (SUTURE) IMPLANT
SUT ETHILON 3-0 (SUTURE) ×3 IMPLANT
SUT PDS #1 CTX NDL (SUTURE) ×3 IMPLANT
SUT PDS AB 0 CT1 27 (SUTURE) ×6 IMPLANT
SUT SILK 0 (SUTURE) ×2
SUT SILK 0 30XBRD TIE 6 (SUTURE) ×1 IMPLANT
SUT SILK 2 0 (SUTURE) ×2
SUT SILK 2-0 18XBRD TIE 12 (SUTURE) ×1 IMPLANT
SUT SILK 3-0 (SUTURE) ×2
SUT SILK 3-0 SH-1 18XCR BRD (SUTURE) ×1
SUT VIC AB 3-0 SH 27 (SUTURE) ×4
SUT VIC AB 3-0 SH 27X BRD (SUTURE) ×2 IMPLANT
SUTURE SILK 3-0 SH-1 18XCR BRD (SUTURE) ×1 IMPLANT
SYR 20CC LL (SYRINGE) ×3 IMPLANT
TRAY FOLEY SLVR 16FR LF STAT (SET/KITS/TRAYS/PACK) ×3 IMPLANT

## 2017-12-06 NOTE — H&P (Signed)
SURGICAL ADMISSION HISTORY & PHYSICAL  HISTORY OF PRESENT ILLNESS (HPI):  67 y.o. [name] presented to [facility] ED today for evaluation of lower abdominal pain. Patient reports her abdomen has been distended x3 days with development of abdominal pain and dry heaves yesterday afternoon without emesis. She denies constipation, but says she strains to have BM's sometimes and describes occasional blood per rectum. Patient says she has been advised to undergo colonoscopy by her primary care physician, particularly considering multiple siblings (sisters) who've died from cancers including one with metastatic colon cancer, but patient has not previously underwent screening colonoscopy. She denies any recent unintentional weight loss, fever/chills, CP, or SOB at rest or with activities including walking a 1 - 2 blocks or up/down a flight of steps. Patient also last ate last night.  Surgery is consulted by ED physician Dr. Scotty Court in this context for evaluation and management of pneumoperitoneum.  PAST MEDICAL HISTORY (PMH):      Past Medical History:  Diagnosis Date  . Anxiety   . Hyperlipidemia   . Hypertension     PAST SURGICAL HISTORY (PSH):  Past Surgical History:  Procedure Laterality Date  . BREAST BIOPSY Left 2015   CORE W/CLIP - NEG  . BREAST BIOPSY Left 07/2007   neg bx   . CESAREAN SECTION    . TUBAL LIGATION      MEDICATIONS:  Prior to Admission medications   Medication Sig Start Date End Date Taking? Authorizing Provider  citalopram (CELEXA) 40 MG tablet Take 1 tablet (40 mg total) by mouth daily. 10/27/17   Particia Nearing, PA-C  cloNIDine (CATAPRES) 0.1 MG tablet Take 1 tablet (0.1 mg total) by mouth daily. 10/27/17   Particia Nearing, PA-C  gabapentin (NEURONTIN) 300 MG capsule Take 2 capsules (600 mg total) by mouth at bedtime. 04/16/17   Particia Nearing, PA-C  hydrochlorothiazide (HYDRODIURIL) 25 MG tablet Take 1 tablet (25 mg total) by mouth  daily. 04/16/17   Particia Nearing, PA-C  hydrOXYzine (VISTARIL) 25 MG capsule Take 1 capsule (25 mg total) by mouth 3 (three) times daily as needed. 10/16/17   Particia Nearing, PA-C  pravastatin (PRAVACHOL) 40 MG tablet Take 1 tablet (40 mg total) by mouth daily. 04/16/17   Particia Nearing, PA-C  sulfamethoxazole-trimethoprim (BACTRIM DS,SEPTRA DS) 800-160 MG tablet Take 1 tablet by mouth 2 (two) times daily. Patient not taking: Reported on 12/03/2017 10/17/17   Particia Nearing, PA-C  Suvorexant (BELSOMRA) 10 MG TABS Take 10 mg by mouth at bedtime as needed. 12/03/17   Particia Nearing, PA-C  triamcinolone cream (KENALOG) 0.1 % Apply 1 application topically 2 (two) times daily. 04/16/17   Particia Nearing, PA-C    ALLERGIES:  No Known Allergies   SOCIAL HISTORY:  Social History        Socioeconomic History  . Marital status: Single    Spouse name: Not on file  . Number of children: Not on file  . Years of education: Not on file  . Highest education level: Not on file  Occupational History  . Not on file  Social Needs  . Financial resource strain: Not on file  . Food insecurity:    Worry: Not on file    Inability: Not on file  . Transportation needs:    Medical: Not on file    Non-medical: Not on file  Tobacco Use  . Smoking status: Former Smoker (switched from smoking to vaping 4 years ago)  . Smokeless  tobacco: Vaping x 4 years  Substance and Sexual Activity  . Alcohol use: No  . Drug use: No  . Sexual activity: Not on file  Lifestyle  . Physical activity:    Days per week: Not on file    Minutes per session: Not on file  . Stress: Not on file  Relationships  . Social connections:    Talks on phone: Not on file    Gets together: Not on file    Attends religious service: Not on file    Active member of club or organization: Not on file    Attends meetings of clubs or organizations: Not on file     Relationship status: Not on file  . Intimate partner violence:    Fear of current or ex partner: Not on file    Emotionally abused: Not on file    Physically abused: Not on file    Forced sexual activity: Not on file  Other Topics Concern  . Not on file  Social History Narrative  . Not on file    The patient currently resides (home / rehab facility / nursing home): Lives with and helps provide care for an elderly friend per patient's sister The patient normally is (ambulatory / bedbound): Ambulatory   FAMILY HISTORY:       Family History  Problem Relation Age of Onset  . Breast cancer Mother   . Liver cancer Sister   . Pancreatic cancer Sister   . Stomach cancer Brother   . Lung cancer Brother   . Brain cancer Sister   . Diabetes Father   . Heart disease Father   . COPD Neg Hx   . Stroke Neg Hx     REVIEW OF SYSTEMS:  Constitutional: denies weight loss, fever, chills, or sweats  Eyes: denies any other vision changes, history of eye injury  ENT: denies sore throat, hearing problems  Respiratory: denies shortness of breath, wheezing  Cardiovascular: denies chest pain, palpitations  Gastrointestinal: abdominal pain, N/V, and bowel function as per HPI Genitourinary: denies burning with urination or urinary frequency Musculoskeletal: denies any other joint pains or cramps  Skin: denies any other rashes or skin discolorations  Neurological: denies any other headache, dizziness, weakness  Psychiatric: denies any other depression, anxiety   All other review of systems were negative   VITAL SIGNS:  Temp:  [98.2 F (36.8 C)] 98.2 F (36.8 C) (11/16 1449) Pulse Rate:  [88-89] 88 (11/16 1500) Resp:  [16] 16 (11/16 1500) BP: (91-100)/(62-68) 100/68 (11/16 1500) SpO2:  [90 %-95 %] 94 % (11/16 1500) Weight:  [81.6 kg] 81.6 kg (11/16 1450)     Height: 5\' 8"  (172.7 cm) Weight: 81.6 kg BMI (Calculated): 27.38   INTAKE/OUTPUT:  This shift: No  intake/output data recorded.  Last 2 shifts: @IOLAST2SHIFTS @   PHYSICAL EXAM:  Constitutional:  -- Overweight-appearing body habitus  -- Awake, alert, and oriented x3, no apparent distress Eyes:  -- Pupils equally round and reactive to light  -- No scleral icterus, B/L no occular discharge Ear, nose, throat: -- Neck is FROM WNL -- No jugular venous distension  Pulmonary:  -- No wheezes or rhales -- Equal breath sounds bilaterally -- Breathing non-labored at rest Cardiovascular:  -- S1, S2 present  -- No pericardial rubs  Gastrointestinal:  -- Abdomen soft, moderately distended with focal lower abdominal (infra-umbilical - suprapubic) tenderness to palpation, no guarding or rebound tenderness -- No abdominal masses appreciated, pulsatile or otherwise  Musculoskeletal and Integumentary:  --  Wounds or skin discoloration: None appreciated -- Extremities: B/L UE and LE FROM, hands and feet warm, no edema  Neurologic:  -- Motor function: Intact and symmetric -- Sensation: Intact and symmetric Psychiatric:  -- Mood and affect WNL  Labs:  CBC Latest Ref Rng & Units 12/06/2017 11/13/2016  WBC 4.0 - 10.5 K/uL 16.7(H) 6.6  Hemoglobin 12.0 - 15.0 g/dL 15.5(H) 13.9  Hematocrit 36.0 - 46.0 % 45.0 41.8  Platelets 150 - 400 K/uL 311 298   CMP Latest Ref Rng & Units 12/06/2017 10/15/2017 11/13/2016  Glucose 70 - 99 mg/dL 161(W) 75 95  BUN 8 - 23 mg/dL 96(E) 21 15  Creatinine 0.44 - 1.00 mg/dL 4.54 0.98 1.19  Sodium 135 - 145 mmol/L 139 143 143  Potassium 3.5 - 5.1 mmol/L 3.3(L) 3.8 3.5  Chloride 98 - 111 mmol/L 98 97 97  CO2 22 - 32 mmol/L 27 28 28   Calcium 8.9 - 10.3 mg/dL 8.9 14.7 9.9  Total Protein 6.5 - 8.1 g/dL 6.8 6.5 6.6  Total Bilirubin 0.3 - 1.2 mg/dL 8.2(N) 0.3 0.6  Alkaline Phos 38 - 126 U/L 45 55 57  AST 15 - 41 U/L 32 28 27  ALT 0 - 44 U/L 42 32 25   Imaging studies:  Chest X-ray (12/06/2017) - personally reviewed and discussed with patient and Dr.  Scotty Court Possible pneumoperitoneum, raising concern for perforated hollow viscus. Attention on pending CT abdomen/pelvis is suggested. No evidence of acute cardiopulmonary disease.  CT Abdomen and Pelvis with Contrast (12/06/2017) - personally reviewed and discussed with patient and Dr. Scotty Court Multiple moderately dilated proximal small  bowel loops with normal caliber distal loops. There is a transition to normal caliber small bowel in the right lower abdomen/upper pelvis or there is mild diffuse low density wall thickening with no visible mass. Stool in the majority of the colon. The appendix is not well visualized. There is mild soft tissue stranding in the pericolonic fat on the right.  1. Moderate amount of free peritoneal air and small amount of free peritoneal fluid, compatible bowel perforation. The source of perforation is not identified. There are some associated inflammatory changes in the right lower and mid abdomen laterally with small bowel wall thickening. 2. Partial small bowel obstruction with a transition to normal caliber small bowel in the right lower abdomen/upper pelvis in an area of mild low density small bowel wall thickening. This could be due to enteritis. No discrete mass is visualized. The small bowel dilatation could also be due to ileus associated with bowel perforation and peritonitis. 3. Mild soft tissue stranding in the pericolonic fat on the right, most likely due to peritonitis. 4. Diffuse hepatic steatosis. 5. Small hiatal hernia.  Assessment/Plan: (ICD-10's: K63.1) 67 y.o. [name] with perforated bowel concerning for stercoral colonic perforation vs malignancy vs clinically less likely perforated SBO as suggested by radiologist on CT, complicated by pertinent comorbidities including HTN, HLD, major depression disorder, and former tobacco abuse (smoking) / current vaping.              - NPO, IV fluids             - pain control as needed              - broad-spectrum IV antibiotic (agree with Zosyn started by ED)             - all risks, benefits, and alternatives to laparotomy with likely bowel resection, possible ostomy, were discussed with the patient  and her sister, all of their questions were answered to their expressed satisfaction, patient expresses she wishes to proceed, and informed consent was obtained.             - plan is for emergent laparotomy with bowel resection, possible ostomy, pending anesthesia and OR             - DVT prophylaxis  All of the above findings and recommendations were discussed with the patient and her sister, and all of patient's and her family's questions were answered to their expressed satisfaction.  -- Scherrie Gerlach Earlene Plater, MD, RPVI [place]: [facility] General Surgery - Partnering for exceptional care. Office: [phone]

## 2017-12-06 NOTE — Anesthesia Post-op Follow-up Note (Signed)
Anesthesia QCDR form completed.        

## 2017-12-06 NOTE — Op Note (Signed)
SURGICAL OPERATIVE REPORT  DATE OF PROCEDURE: 12/06/2017  ATTENDING Surgeon(s): Ancil Linsey, MD  ASSISTANT(S): Sung Amabile, DO  ANESTHESIA: GETA  PRE-OPERATIVE DIAGNOSIS: Bowel perforation (icd-10: K63.1)  POST-OPERATIVE DIAGNOSIS: Stercoral perforation of the sigmoid colon (icd-10: K63.1)  PROCEDURE(S): (cpt's: 44140) 1.) Segmental sigmoid colectomy (12 cm) with primary linear anastomosis  INTRAOPERATIVE FINDINGS: Small amount of purulent fluid encountered upon entering peritoneal cavity, focal mid-sigmoid colonic stercoral perforation with contained very hard impacted stool without diffuse contamination, pink healthy viable adjacent colon, a second hard rock-like stool milked distally from the proximal sigmoid colon out through the stercoral perforation, somewhat softer moderately firm stool throughout the transverse colon, widely patient well-perfused mid-sigmoid colonic linear anastomosis without tension, mesenteric defect reapproximated  INTRAVENOUS FLUIDS: 1600 mL crystalloid   ESTIMATED BLOOD LOSS: 150 mL   URINE OUTPUT: 500 mL   SPECIMENS: Partial sigmoid colon   IMPLANTS: None  DRAINS: None   COMPLICATIONS: None apparent   CONDITION AT END OF PROCEDURE: Hemodynamically stable and extubated   DISPOSITION OF PATIENT: PACU  INDICATIONS FOR PROCEDURE:  Patient is a 67 y.o. female who presented with lower abdominal pain and distention. She also reports a history of straining with BM's, occasional blood per rectum, and a family history including a sibling who died from metastatic colon cancer and multiple other sibling non-colon cancer diagnoses in the absence of screening colonoscopy for patient. Differential diagnoses along with all risks, benefits, and alternatives to partial colectomy, possible colostomy, were discussed with the patient and her sister, all of patient's and her sister's questions were answered to their expressed satisfaction, and informed  consent was accordingly obtained and documented at that time.  DETAILS OF PROCEDURE: Patient was brought to the operating suite and appropriately identified. General anesthesia was administered along with appropriate pre-operative antibiotics, and endotracheal intubation was performed by anesthetist. NG tube was also inserted. In supine position, operative site was prepped and draped in the usual sterile fashion, and following a brief time out, a lower vertical midline incision was made from the Left side of the umbilicus inferiorly to just above the pubis using a #10 blade scalpel and extended deep through subcutaneous tissues until fascia was visualized and exposed along the linea alba midline between the rectus abdominal muscles. Lower midline fascia was then divided in the midline. Upon dissecting through preperitoneal fat, the peritoneum was entered and opened along the length of the incision, taking care to avoid injury to underlying bowel. Upon entering the peritoneal cavity, a small to moderate amount of focal purulent fluid was encountered and suctioned.  The peritoneal cavity was explored with particular attention on the patient's redundant colon, which was found to have a large amount of moderately firm stool throughout the transverse colon and two rock-hard balls of impacted stool in the proximal- and mid- sigmoid colon, the latter of which was found to be associated with a 2 cm clean-cut stercoral perforation with focal adjacent fibrinous exudate along the the wall of the adjacent sigmoid colon, beyond which was found to be otherwise pink, well-perfused, and without any further significant contamination. Abdominal cavity was explored, and no other pathology was appreciated. The small bowel was retracted to the superiorly and to the Right. The descending colon was then retracted medially using a moist towel. Using a combination of selective electrocautery and blunt gloved finger dissection, the  colon was freed from its lateral peritoneal attachments along the white line of Toldt until adequate length and laxity were achieved in order to perform  segmental sigmoid colectomy with primary linear stapled anastomosis. During this dissection, injury to the ureters was avoided, and the splenic flexure did not need to be taken down in this case. Points of transection were selected distally and proximally, and the proximal bowel was stapled and divided using a GIA-75 linear cutting stapler, while the distal colon was stapled and divided likewise using a GIA-75 linear cutting stapler reload. Peritoneum was then scored along the mesentary with electrocautery, and the vessels were clamped, cut between two clamps, and tied 0-0 silk suture ties. Colon specimen was then handed off of the field as specimen for pathology. The two stapled ends of antimesenteric bowel were confirmed to lay adjacent and parallel without tension and were approximated using 3-0 silk suture, and the corners were each cut and dilated, through which a linear cutting stapler was advanced and used to create the colonic anastomosis and one more reload to then close the resulting colonic defect.  The staple line was inspected and found to be intact, widely patient, and hemostatic. Interrupted 3-0 silk Lembert sutures were then used to approximate tissue over the new anastomosis, utilizing peri-colonic fat pads, and to re-approximate the relatively short mesenteric defect as well. The abdominal cavity was copiously irrigated with warm irrigation, which was suctioned, and hemostasis was once more confirmed. 77F Blake/round drain was placed from the RLQ into the pelvis and adjacent to the new colonic anastomosis. Exparel (72-hour release liposomal formulation of bupivicane) was injected into fascia and subcutaneously, and the abdominal wall was re-approximated in layers with #1 looped PDS sutures to re-approximate fascia from the top and bottom of the  incision, buried interrupted 3-0 Vicryl sutures were used to re-approximate dermis, and surgical skin staples were used to re-approximate skin. Skin was then cleaned and dried, and a sterile dressing was applied. Patient was then safely able to be extubated, awakened, and transferred to PACU for post-operative monitoring and care.  I was present for all aspects of the above procedure, and no operative complications were apparent.

## 2017-12-06 NOTE — Progress Notes (Signed)
Pharmacy Antibiotic Note  Marcia MarshallJanice I [name] is a 67 y.o. female admitted on 12/06/2017 with intra-abdominal infection.  Pharmacy has been consulted for Zosyn dosing.  Plan: Zosyn 3.375 g IV q8h extended infusion to start at 2200  Height: 5\' 8"  (172.7 cm) Weight: 180 lb (81.6 kg) IBW/kg (Calculated) : 63.9  Temp (24hrs), Avg:98.2 F (36.8 C), Min:98.2 F (36.8 C), Max:98.2 F (36.8 C)  Recent Labs  Lab 12/06/17 1451  WBC 16.7*  CREATININE 0.77    Estimated Creatinine Clearance: 77.5 mL/min (by C-G formula based on SCr of 0.77 mg/dL).    No Known Allergies  Antimicrobials this admission: Zosyn 11/16 >>  Dose adjustments this admission: NA  Microbiology results:   Thank you for allowing pharmacy to be a part of this patient's care.  Marcia [name], PharmD Pharmacy Resident  12/06/2017 5:45 PM

## 2017-12-06 NOTE — Transfer of Care (Signed)
Immediate Anesthesia Transfer of Care Note  Patient: Marcia [name]  Procedure(s) Performed: EXPLORATORY LAPAROTOMY, sigmoid colectomy, anastomosis (N/A Abdomen)  Patient Location: PACU  Anesthesia Type:General  Level of Consciousness: drowsy and patient cooperative  Airway & Oxygen Therapy: Patient Spontanous Breathing and Patient connected to face mask oxygen  Post-op Assessment: Report given to RN and Post -op Vital signs reviewed and stable  Post vital signs: Reviewed and stable  Last Vitals:  Vitals Value Taken Time  BP 96/32 12/06/2017 10:35 PM  Temp    Pulse 84 12/06/2017 10:36 PM  Resp 14 12/06/2017 10:36 PM  SpO2 100 % 12/06/2017 10:36 PM  Vitals shown include unvalidated device data.  Last Pain:  Vitals:   12/06/17 1449  TempSrc: Oral  PainSc: 8          Complications: No apparent anesthesia complications

## 2017-12-06 NOTE — Anesthesia Procedure Notes (Addendum)
Procedure Name: Intubation Performed by: Yevette EdwardsAdams, James G, MD Pre-anesthesia Checklist: Patient identified, Emergency Drugs available, Suction available, Patient being monitored and Timeout performed Patient Re-evaluated:Patient Re-evaluated prior to induction Oxygen Delivery Method: Circle system utilized Preoxygenation: Pre-oxygenation with 100% oxygen Induction Type: IV induction, Rapid sequence and Cricoid Pressure applied Laryngoscope Size: Miller and 2 Grade View: Grade II Tube type: Oral Tube size: 7.0 mm Number of attempts: 1 Airway Equipment and Method: Stylet Placement Confirmation: ETT inserted through vocal cords under direct vision,  positive ETCO2 and breath sounds checked- equal and bilateral Secured at: 21 cm Tube secured with: Tape

## 2017-12-06 NOTE — ED Triage Notes (Signed)
Pt arrived via EMS c/o lower abd pain that began last night and radiates to rt side. Denies N/V/D

## 2017-12-06 NOTE — ED Provider Notes (Signed)
[facility] Emergency Department Provider Note  ____________________________________________  Time seen: Approximately 4:21 PM  I have reviewed the triage vital signs and the nursing notes.   HISTORY  Chief Complaint Abdominal Pain    HPI Marcia [name] is a 67 y.o. female with a history of hypertension and hyperlipidemia who complains of right lower quadrant abdominal pain that started this morning, gradual onset, worsening.  Constant.  No aggravating or alleviating factors.  Nonradiating.  Never had pain like this before.  Also noticed that her abdomen is swelling up which is never happened before.  Denies fever chills or sweats.  Denies liver disease or kidney disease.  She saw her PCP 3 days ago in clinic for routine follow-up, was asymptomatic at that time.  No new medication changes.    Past Medical History:  Diagnosis Date  . Anxiety   . Hyperlipidemia   . Hypertension      Patient Active Problem List   Diagnosis Date Noted  . Anxiety 04/19/2017  . Hypertension 11/13/2016  . Hyperlipidemia 11/13/2016  . 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 BREAST BIOPSY Left 2015   CORE W/CLIP - NEG  . BREAST BIOPSY Left 07/2007   neg bx   . CESAREAN SECTION    . TUBAL LIGATION       Prior to Admission medications   Medication Sig Start Date End Date Taking? Authorizing Provider  citalopram (CELEXA) 40 MG tablet Take 1 tablet (40 mg total) by mouth daily. 10/27/17   Particia NearingLane, Rachel Elizabeth, PA-C  cloNIDine (CATAPRES) 0.1 MG tablet Take 1 tablet (0.1 mg total) by mouth daily. 10/27/17   Particia NearingLane, Rachel Elizabeth, PA-C  gabapentin (NEURONTIN) 300 MG capsule Take 2 capsules (600 mg total) by mouth at bedtime. 04/16/17   Particia NearingLane, Rachel Elizabeth, PA-C  hydrochlorothiazide (HYDRODIURIL) 25 MG tablet Take 1 tablet (25 mg total) by mouth daily. 04/16/17   Particia NearingLane, Rachel Elizabeth, PA-C  hydrOXYzine (VISTARIL) 25  MG capsule Take 1 capsule (25 mg total) by mouth 3 (three) times daily as needed. 10/16/17   Particia NearingLane, Rachel Elizabeth, PA-C  pravastatin (PRAVACHOL) 40 MG tablet Take 1 tablet (40 mg total) by mouth daily. 04/16/17   Particia NearingLane, Rachel Elizabeth, PA-C  sulfamethoxazole-trimethoprim (BACTRIM DS,SEPTRA DS) 800-160 MG tablet Take 1 tablet by mouth 2 (two) times daily. Patient not taking: Reported on 12/03/2017 10/17/17   Particia NearingLane, Rachel Elizabeth, PA-C  Suvorexant (BELSOMRA) 10 MG TABS Take 10 mg by mouth at bedtime as needed. 12/03/17   Particia NearingLane, Rachel Elizabeth, PA-C  triamcinolone cream (KENALOG) 0.1 % Apply 1 application topically 2 (two) times daily. 04/16/17   Particia NearingLane, Rachel Elizabeth, PA-C     Allergies Patient has no known allergies.   Family History  Problem Relation Age of Onset  . Breast cancer Mother   . Liver cancer Sister   . Pancreatic cancer Sister   . Stomach cancer Brother   . Lung cancer Brother   . Brain cancer Sister   . Diabetes Father   . Heart disease Father   . COPD Neg Hx   . Stroke Neg Hx     Social History Social History   Tobacco Use  . Smoking status: Never Smoker  . Smokeless tobacco: Former Engineer, water[employer]  Substance Use Topics  . Alcohol use: No  . Drug use: No    Review of Systems  Constitutional:   No fever or chills.  ENT:   No  sore throat. No rhinorrhea. Cardiovascular:   No chest pain or syncope. Respiratory:   No dyspnea or cough. Gastrointestinal:   Positive as above for abdominal pain without vomiting and diarrhea.  Musculoskeletal:   Negative for focal pain or swelling All other systems reviewed and are negative except as documented above in ROS and HPI.  ____________________________________________   PHYSICAL EXAM:  VITAL SIGNS: ED Triage Vitals  Enc Vitals Group     BP 12/06/17 1449 91/62     Pulse Rate 12/06/17 1449 89     Resp 12/06/17 1449 16     Temp 12/06/17 1449 98.2 F (36.8 C)     Temp Source 12/06/17 1449 Oral     SpO2 12/06/17 1444 95  %     Weight 12/06/17 1450 180 lb (81.6 kg)     Height 12/06/17 1450 5\' 8"  (1.727 m)     Head Circumference --      Peak Flow --      Pain Score 12/06/17 1449 8     Pain Loc --      Pain Edu? --      Excl. in GC? --     Vital signs reviewed, nursing assessments reviewed.   Constitutional:   Alert and oriented. Non-toxic appearance. Eyes:   Conjunctivae are normal. EOMI. PERRL. ENT      Head:   Normocephalic and atraumatic.      Nose:   No congestion/rhinnorhea.       Mouth/Throat:   Dry mucous membranes, no pharyngeal erythema. No peritonsillar mass.       Neck:   No meningismus. Full ROM. Hematological/Lymphatic/Immunilogical:   No cervical lymphadenopathy. Cardiovascular:   RRR. Symmetric bilateral radial and DP pulses.  No murmurs. Cap refill less than 2 seconds. Respiratory:   Normal respiratory effort without tachypnea/retractions. Breath sounds are clear and equal bilaterally. No wheezes/rales/rhonchi. Gastrointestinal:   Soft with diffuse tenderness.  Significantly distended.. There is no CVA tenderness.  Positive guarding and rebound.  Nonrigid. Musculoskeletal:   Normal range of motion in all extremities. No joint effusions.  No lower extremity tenderness.  No edema. Neurologic:   Normal speech and language.  Motor grossly intact. No acute focal neurologic deficits are appreciated.  Skin:    Skin is warm, dry and intact. No rash noted.  No petechiae, purpura, or bullae.  ____________________________________________    LABS (pertinent positives/negatives) (all labs ordered are listed, but only abnormal results are displayed) Labs Reviewed  COMPREHENSIVE METABOLIC PANEL - Abnormal; Notable for the following components:      Result Value   Potassium 3.3 (*)    Glucose, Bld 185 (*)    BUN 27 (*)    Total Bilirubin 1.4 (*)    All other components within normal limits  CBC - Abnormal; Notable for the following components:   WBC 16.7 (*)    RBC 5.16 (*)    Hemoglobin  15.5 (*)    All other components within normal limits  LIPASE, BLOOD  AMMONIA  URINALYSIS, COMPLETE (UACMP) WITH MICROSCOPIC   ____________________________________________   EKG    ____________________________________________    RADIOLOGY  Dg Chest 2 View  Result Date: 12/06/2017 CLINICAL DATA:  Lower abdominal pain EXAM: CHEST - 2 VIEW COMPARISON:  None. FINDINGS: Lungs are essentially clear. Mild left basilar opacity, possibly atelectasis. No pleural effusion or pneumothorax. The heart is normal in size. Mild degenerative changes of the visualized thoracolumbar spine. Lucency beneath the right hemidiaphragm may reflect pneumoperitoneum. IMPRESSION: Possible  pneumoperitoneum, raising concern for perforated hollow viscus. Attention on pending CT abdomen/pelvis is suggested. No evidence of acute cardiopulmonary disease. Critical Value/emergent results were called by telephone at the time of interpretation on 12/06/2017 at 4:00 pm to Dr. Sharman Cheek , who verbally acknowledged these results. Electronically Signed   By: Charline Bills M.D.   On: 12/06/2017 16:03    ____________________________________________   PROCEDURES .Critical Care Performed by: Sharman Cheek, MD Authorized by: Sharman Cheek, MD   Critical care provider statement:    Critical care time (minutes):  35   Critical care time was exclusive of:  Separately billable procedures and treating other patients   Critical care was necessary to treat or prevent imminent or life-threatening deterioration of the following conditions:  Sepsis   Critical care was time spent personally by me on the following activities:  Development of treatment plan with patient or surrogate, discussions with consultants, evaluation of patient's response to treatment, examination of patient, obtaining history from patient or surrogate, ordering and performing treatments and interventions, ordering and review of laboratory studies,  ordering and review of radiographic studies, pulse oximetry, re-evaluation of patient's condition and review of old charts    ____________________________________________  DIFFERENTIAL DIAGNOSIS   Bowel obstruction, bowel perforation, appendicitis, undiagnosed cirrhosis/liver failure.  CLINICAL IMPRESSION / ASSESSMENT AND PLAN / ED COURSE  Pertinent labs & imaging results that were available during my care of the patient were reviewed by me and considered in my medical decision making (see chart for details).    Patient presents with right lower quadrant pain and abdominal distention with concerning exam.  Vital signs are normal.  Check labs, plan chest x-ray and CT abdomen pelvis.  Patient denies any chest pain or shortness of breath but has room air oxygen saturation of 90%, so we will continue her on nasal cannula for now.  Clinical Course as of Dec 07 1635  Sat Dec 06, 2017  1540 Xray image viewed by me, shows free air under the right diaphragm. Will give zosyn and contact surgery.   [PS]  1552 D/w surgery Dr. Earlene Plater. Pt in CT right now. Will f/u surg. Recs afterward.    [PS]  1636 CT discussed with radiology who confirms abdominal free air and free fluid.  Best guess is that perforation is an area of the right lower quadrant as they see some bowel wall thickening and fat stranding in that area.  Surgery plans exploratory laparotomy. Dr. Earlene Plater advises that there is currently another pt awaiting emergent Ex Lap, so this may necessitate a delay in surgery if the other patient is clinically sicker.   [PS]    Clinical Course User Index [PS] Sharman Cheek, MD     ____________________________________________   FINAL CLINICAL IMPRESSION(S) / ED DIAGNOSES    Final diagnoses:  Generalized abdominal pain  Intra-abdominal free air of unknown etiology  Peritonitis [facility])     ED Discharge Orders    None      Portions of this note were generated with dragon dictation  software. Dictation errors may occur despite best attempts at proofreading.    Sharman Cheek, MD 12/06/17 [phone]

## 2017-12-06 NOTE — ED Notes (Signed)
Patient transported to X-ray 

## 2017-12-06 NOTE — ED Notes (Signed)
Patient transported to CT 

## 2017-12-06 NOTE — Anesthesia Preprocedure Evaluation (Signed)
Anesthesia Evaluation  Patient identified by MRN, date of birth, ID band Patient awake    Reviewed: Allergy & Precautions, H&P , NPO status , Patient's Chart, lab work & pertinent test results, reviewed documented beta blocker date and time   Airway Mallampati: II  TM Distance: >3 FB Neck ROM: full    Dental  (+) Teeth Intact   Pulmonary neg pulmonary ROS,    Pulmonary exam normal        Cardiovascular Exercise Tolerance: Good hypertension, Pt. on medications and On Medications negative cardio ROS Normal cardiovascular exam Rhythm:regular Rate:Normal     Neuro/Psych negative neurological ROS  negative psych ROS   GI/Hepatic negative GI ROS, Neg liver ROS,   Endo/Other  negative endocrine ROS  Renal/GU negative Renal ROS  negative genitourinary   Musculoskeletal   Abdominal   Peds  Hematology negative hematology ROS (+)   Anesthesia Other Findings Past Medical History: No date: Anxiety No date: Hyperlipidemia No date: Hypertension Past Surgical History: 2015: BREAST BIOPSY; Left     Comment:  CORE W/CLIP - NEG 07/2007: BREAST BIOPSY; Left     Comment:  neg bx  No date: CESAREAN SECTION No date: TUBAL LIGATION BMI    Body Mass Index:  27.37 kg/m     Reproductive/Obstetrics negative OB ROS                             Anesthesia Physical Anesthesia Plan  ASA: II and emergent  Anesthesia Plan: General ETT   Post-op Pain Management:    Induction:   PONV Risk Score and Plan:   Airway Management Planned:   Additional Equipment:   Intra-op Plan:   Post-operative Plan:   Informed Consent: I have reviewed the patients History and Physical, chart, labs and discussed the procedure including the risks, benefits and alternatives for the proposed anesthesia with the patient or authorized representative who has indicated his/her understanding and acceptance.   Dental Advisory  Given  Plan Discussed with: CRNA  Anesthesia Plan Comments:         Anesthesia Quick Evaluation

## 2017-12-06 NOTE — Consult Note (Signed)
SURGICAL CONSULTATION NOTE (initial) - cpt: 09811  HISTORY OF PRESENT ILLNESS (HPI):  67 y.o. female presented to [facility] ED today for evaluation of lower abdominal pain. Patient reports her abdomen has been distended x3 days with development of abdominal pain and dry heaves yesterday afternoon without emesis. She denies constipation, but says she strains to have BM's sometimes and describes occasional blood per rectum. Patient says she has been advised to undergo colonoscopy by her primary care physician, particularly considering multiple siblings (sisters) who've died from cancers including one with metastatic colon cancer, but patient has not previously underwent screening colonoscopy. She denies any recent unintentional weight loss, fever/chills, CP, or SOB at rest or with activities including walking a 1 - 2 blocks or up/down a flight of steps. Patient also last ate last night.  Surgery is consulted by ED physician Dr. Scotty Court in this context for evaluation and management of pneumoperitoneum.  PAST MEDICAL HISTORY (PMH):  Past Medical History:  Diagnosis Date  . Anxiety   . Hyperlipidemia   . Hypertension     PAST SURGICAL HISTORY (PSH):  Past Surgical History:  Procedure Laterality Date  . BREAST BIOPSY Left 2015   CORE W/CLIP - NEG  . BREAST BIOPSY Left 07/2007   neg bx   . CESAREAN SECTION    . TUBAL LIGATION      MEDICATIONS:  Prior to Admission medications   Medication Sig Start Date End Date Taking? Authorizing Provider  citalopram (CELEXA) 40 MG tablet Take 1 tablet (40 mg total) by mouth daily. 10/27/17   Particia Nearing, PA-C  cloNIDine (CATAPRES) 0.1 MG tablet Take 1 tablet (0.1 mg total) by mouth daily. 10/27/17   Particia Nearing, PA-C  gabapentin (NEURONTIN) 300 MG capsule Take 2 capsules (600 mg total) by mouth at bedtime. 04/16/17   Particia Nearing, PA-C  hydrochlorothiazide (HYDRODIURIL) 25 MG tablet Take 1 tablet (25 mg total) by mouth daily.  04/16/17   Particia Nearing, PA-C  hydrOXYzine (VISTARIL) 25 MG capsule Take 1 capsule (25 mg total) by mouth 3 (three) times daily as needed. 10/16/17   Particia Nearing, PA-C  pravastatin (PRAVACHOL) 40 MG tablet Take 1 tablet (40 mg total) by mouth daily. 04/16/17   Particia Nearing, PA-C  sulfamethoxazole-trimethoprim (BACTRIM DS,SEPTRA DS) 800-160 MG tablet Take 1 tablet by mouth 2 (two) times daily. Patient not taking: Reported on 12/03/2017 10/17/17   Particia Nearing, PA-C  Suvorexant (BELSOMRA) 10 MG TABS Take 10 mg by mouth at bedtime as needed. 12/03/17   Particia Nearing, PA-C  triamcinolone cream (KENALOG) 0.1 % Apply 1 application topically 2 (two) times daily. 04/16/17   Particia Nearing, PA-C    ALLERGIES:  No Known Allergies   SOCIAL HISTORY:  Social History   Socioeconomic History  . Marital status: Single    Spouse name: Not on file  . Number of children: Not on file  . Years of education: Not on file  . Highest education level: Not on file  Occupational History  . Not on file  Social Needs  . Financial resource strain: Not on file  . Food insecurity:    Worry: Not on file    Inability: Not on file  . Transportation needs:    Medical: Not on file    Non-medical: Not on file  Tobacco Use  . Smoking status: Former Smoker (switched from smoking to vaping 4 years ago)  . Smokeless tobacco: Vaping x 4 years  Substance  and Sexual Activity  . Alcohol use: No  . Drug use: No  . Sexual activity: Not on file  Lifestyle  . Physical activity:    Days per week: Not on file    Minutes per session: Not on file  . Stress: Not on file  Relationships  . Social connections:    Talks on phone: Not on file    Gets together: Not on file    Attends religious service: Not on file    Active member of club or organization: Not on file    Attends meetings of clubs or organizations: Not on file    Relationship status: Not on file  . Intimate  partner violence:    Fear of current or ex partner: Not on file    Emotionally abused: Not on file    Physically abused: Not on file    Forced sexual activity: Not on file  Other Topics Concern  . Not on file  Social History Narrative  . Not on file    The patient currently resides (home / rehab facility / nursing home): Lives with and helps provide care for an elderly friend per patient's sister The patient normally is (ambulatory / bedbound): Ambulatory   FAMILY HISTORY:  Family History  Problem Relation Age of Onset  . Breast cancer Mother   . Liver cancer Sister   . Pancreatic cancer Sister   . Stomach cancer Brother   . Lung cancer Brother   . Brain cancer Sister   . Diabetes Father   . Heart disease Father   . COPD Neg Hx   . Stroke Neg Hx     REVIEW OF SYSTEMS:  Constitutional: denies weight loss, fever, chills, or sweats  Eyes: denies any other vision changes, history of eye injury  ENT: denies sore throat, hearing problems  Respiratory: denies shortness of breath, wheezing  Cardiovascular: denies chest pain, palpitations  Gastrointestinal: abdominal pain, N/V, and bowel function as per HPI Genitourinary: denies burning with urination or urinary frequency Musculoskeletal: denies any other joint pains or cramps  Skin: denies any other rashes or skin discolorations  Neurological: denies any other headache, dizziness, weakness  Psychiatric: denies any other depression, anxiety   All other review of systems were negative   VITAL SIGNS:  Temp:  [98.2 F (36.8 C)] 98.2 F (36.8 C) (11/16 1449) Pulse Rate:  [88-89] 88 (11/16 1500) Resp:  [16] 16 (11/16 1500) BP: (91-100)/(62-68) 100/68 (11/16 1500) SpO2:  [90 %-95 %] 94 % (11/16 1500) Weight:  [81.6 kg] 81.6 kg (11/16 1450)     Height: 5\' 8"  (172.7 cm) Weight: 81.6 kg BMI (Calculated): 27.38   INTAKE/OUTPUT:  This shift: No intake/output data recorded.  Last 2 shifts: @IOLAST2SHIFTS @   PHYSICAL EXAM:   Constitutional:  -- Overweight-appearing body habitus  -- Awake, alert, and oriented x3, no apparent distress Eyes:  -- Pupils equally round and reactive to light  -- No scleral icterus, B/L no occular discharge Ear, nose, throat: -- Neck is FROM WNL -- No jugular venous distension  Pulmonary:  -- No wheezes or rhales -- Equal breath sounds bilaterally -- Breathing non-labored at rest Cardiovascular:  -- S1, S2 present  -- No pericardial rubs  Gastrointestinal:  -- Abdomen soft, moderately distended with focal lower abdominal (infra-umbilical - suprapubic) tenderness to palpation, no guarding or rebound tenderness -- No abdominal masses appreciated, pulsatile or otherwise  Musculoskeletal and Integumentary:  -- Wounds or skin discoloration: None appreciated -- Extremities: B/L UE  and LE FROM, hands and feet warm, no edema  Neurologic:  -- Motor function: Intact and symmetric -- Sensation: Intact and symmetric Psychiatric:  -- Mood and affect WNL  Labs:  CBC Latest Ref Rng & Units 12/06/2017 11/13/2016  WBC 4.0 - 10.5 K/uL 16.7(H) 6.6  Hemoglobin 12.0 - 15.0 g/dL 15.5(H) 13.9  Hematocrit 36.0 - 46.0 % 45.0 41.8  Platelets 150 - 400 K/uL 311 298   CMP Latest Ref Rng & Units 12/06/2017 10/15/2017 11/13/2016  Glucose 70 - 99 mg/dL 409(W) 75 95  BUN 8 - 23 mg/dL 11(B) 21 15  Creatinine 0.44 - 1.00 mg/dL 1.47 8.29 5.62  Sodium 135 - 145 mmol/L 139 143 143  Potassium 3.5 - 5.1 mmol/L 3.3(L) 3.8 3.5  Chloride 98 - 111 mmol/L 98 97 97  CO2 22 - 32 mmol/L 27 28 28   Calcium 8.9 - 10.3 mg/dL 8.9 13.0 9.9  Total Protein 6.5 - 8.1 g/dL 6.8 6.5 6.6  Total Bilirubin 0.3 - 1.2 mg/dL 8.6(V) 0.3 0.6  Alkaline Phos 38 - 126 U/L 45 55 57  AST 15 - 41 U/L 32 28 27  ALT 0 - 44 U/L 42 32 25   Imaging studies:  Chest X-ray (12/06/2017) - personally reviewed and discussed with patient and Dr. Scotty Court Possible pneumoperitoneum, raising concern for perforated hollow viscus. Attention on  pending CT abdomen/pelvis is suggested. No evidence of acute cardiopulmonary disease.  CT Abdomen and Pelvis with Contrast (12/06/2017) - personally reviewed and discussed with patient and Dr. Scotty Court Multiple moderately dilated proximal small  bowel loops with normal caliber distal loops. There is a transition to normal caliber small bowel in the right lower abdomen/upper pelvis or there is mild diffuse low density wall thickening with no visible mass. Stool in the majority of the colon. The appendix is not well visualized. There is mild soft tissue stranding in the pericolonic fat on the right.  1. Moderate amount of free peritoneal air and small amount of free peritoneal fluid, compatible bowel perforation. The source of perforation is not identified. There are some associated inflammatory changes in the right lower and mid abdomen laterally with small bowel wall thickening. 2. Partial small bowel obstruction with a transition to normal caliber small bowel in the right lower abdomen/upper pelvis in an area of mild low density small bowel wall thickening. This could be due to enteritis. No discrete mass is visualized. The small bowel dilatation could also be due to ileus associated with bowel perforation and peritonitis. 3. Mild soft tissue stranding in the pericolonic fat on the right, most likely due to peritonitis. 4. Diffuse hepatic steatosis. 5. Small hiatal hernia.  Assessment/Plan: (ICD-10's: K19.1) 67 y.o. female with perforated bowel concerning for stercoral colonic perforation vs malignancy vs clinically less likely perforated SBO as suggested by radiologist on CT, complicated by pertinent comorbidities including HTN, HLD, major depression disorder, and former tobacco abuse (smoking) / current vaping.   - NPO, IV fluids  - pain control as needed  - broad-spectrum IV antibiotic (agree with Zosyn started by ED)  - all risks, benefits, and alternatives to laparotomy with  likely bowel resection, possible ostomy, were discussed with the patient and her sister, all of their questions were answered to their expressed satisfaction, patient expresses she wishes to proceed, and informed consent was obtained.  - plan is for emergent laparotomy with bowel resection, possible ostomy, pending anesthesia and OR  - DVT prophylaxis  All of the above findings and recommendations were discussed  with the patient and her sister, and all of patient's and her family's questions were answered to their expressed satisfaction.  Thank you for the opportunity to participate in this patient's care.   -- Scherrie Gerlach Earlene Plater, MD, RPVI [place]: [facility] General Surgery - Partnering for exceptional care. Office: [phone]

## 2017-12-07 ENCOUNTER — Other Ambulatory Visit: Payer: Self-pay

## 2017-12-07 ENCOUNTER — Encounter: Payer: Self-pay | Admitting: *Deleted

## 2017-12-07 LAB — BASIC METABOLIC PANEL
Anion gap: 7 (ref 5–15)
BUN: 23 mg/dL (ref 8–23)
CHLORIDE: 102 mmol/L (ref 98–111)
CO2: 30 mmol/L (ref 22–32)
CREATININE: 0.83 mg/dL (ref 0.44–1.00)
Calcium: 8.1 mg/dL — ABNORMAL LOW (ref 8.9–10.3)
GFR calc Af Amer: >60 mL/min (ref >60)
GFR calc non Af Amer: >60 mL/min (ref >60)
GLUCOSE: 193 mg/dL — AB (ref 70–99)
Potassium: 3.3 mmol/L — ABNORMAL LOW (ref 3.5–5.1)
Sodium: 139 mmol/L (ref 135–145)

## 2017-12-07 LAB — CBC
HCT: 39.8 % (ref 36.0–46.0)
Hemoglobin: 13.3 g/dL (ref 12.0–15.0)
MCH: 29.8 pg (ref 26.0–34.0)
MCHC: 33.4 g/dL (ref 30.0–36.0)
MCV: 89.2 fL (ref 80.0–100.0)
Platelets: 265 10*3/uL (ref 150–400)
RBC: 4.46 MIL/uL (ref 3.87–5.11)
RDW: 15.1 % (ref 11.5–15.5)
WBC: 12.3 10*3/uL — ABNORMAL HIGH (ref 4.0–10.5)
nRBC: 0 % (ref 0.0–0.2)

## 2017-12-07 MED ORDER — CITALOPRAM HYDROBROMIDE 20 MG PO TABS
40.0000 mg | ORAL_TABLET | Freq: Every day | ORAL | Status: DC
  Administered 2017-12-07 – 2017-12-11 (×5): 40 mg via ORAL
  Filled 2017-12-07 (×5): qty 2

## 2017-12-07 MED ORDER — DOCUSATE SODIUM 100 MG PO CAPS
100.0000 mg | ORAL_CAPSULE | Freq: Two times a day (BID) | ORAL | Status: DC
  Administered 2017-12-07 – 2017-12-11 (×9): 100 mg via ORAL
  Filled 2017-12-07 (×9): qty 1

## 2017-12-07 MED ORDER — CLONIDINE HCL 0.1 MG PO TABS
0.1000 mg | ORAL_TABLET | Freq: Every day | ORAL | Status: DC
  Administered 2017-12-07 – 2017-12-11 (×5): 0.1 mg via ORAL
  Filled 2017-12-07 (×5): qty 1

## 2017-12-07 MED ORDER — MENTHOL 3 MG MT LOZG
1.0000 | LOZENGE | OROMUCOSAL | Status: DC | PRN
  Administered 2017-12-07: 3 mg via ORAL
  Filled 2017-12-07: qty 9

## 2017-12-07 MED ORDER — GABAPENTIN 300 MG PO CAPS
600.0000 mg | ORAL_CAPSULE | Freq: Every day | ORAL | Status: DC
  Administered 2017-12-07 – 2017-12-10 (×4): 600 mg via ORAL
  Filled 2017-12-07 (×4): qty 2

## 2017-12-07 MED ORDER — PRAVASTATIN SODIUM 20 MG PO TABS
40.0000 mg | ORAL_TABLET | Freq: Every day | ORAL | Status: DC
  Administered 2017-12-07 – 2017-12-10 (×4): 40 mg via ORAL
  Filled 2017-12-07 (×4): qty 2

## 2017-12-07 NOTE — Progress Notes (Signed)
Patient has incentive at bedside. States she understands how to use

## 2017-12-07 NOTE — Progress Notes (Signed)
SURGICAL PROGRESS NOTE  Hospital Day(s): 1.   Post op day(s): 1 Day Post-Op.   Interval History: Patient seen and examined, no acute events or new complaints overnight. Patient reports mild peri-incisional abdominal pain without flatus, N/V, fever/chills, CP, or SOB. Very little has drained from NG tube.  Review of Systems:  Constitutional: denies fever, chills  Respiratory: denies any shortness of breath  Cardiovascular: denies chest pain or palpitations  Gastrointestinal: abdominal pain, N/V, and bowel function as per interval history Musculoskeletal: denies pain, decreased motor or sensation Integumentary: denies any other rashes or skin discolorations except post-surgical abdominal wound  Vital signs in last 24 hours: [min-max] current  Temp:  [97.5 F (36.4 C)-98.4 F (36.9 C)] 98.4 F (36.9 C) (11/17 0611) Pulse Rate:  [73-89] 81 (11/17 0611) Resp:  [14-20] 16 (11/17 0611) BP: (91-109)/(40-74) 102/53 (11/17 0611) SpO2:  [90 %-100 %] 93 % (11/17 0611) Weight:  [81.6 kg] 81.6 kg (11/16 1450)     Height: 5\' 8"  (172.7 cm) Weight: 81.6 kg BMI (Calculated): 27.38   Intake/Output this shift:  Total I/O In: 427 [I.V.:408.6; IV Piggyback:18.4] Out: 35 [Drains:35]   Intake/Output last 2 shifts:  @IOLAST2SHIFTS @   Physical Exam:  Constitutional: alert, cooperative and no distress  Respiratory: breathing non-labored at rest  Cardiovascular: regular rate and sinus rhythm  Gastrointestinal: soft and obese, but non-distended with minimal peri-incisional tenderness to palpation, no surrounding erythema or drainage, RLQ drain well-secured with serosanguinous fluid in tubing and bulb without surrounding erythema or leakage  Labs:  CBC Latest Ref Rng & Units 12/07/2017 12/06/2017 11/13/2016  WBC 4.0 - 10.5 K/uL 12.3(H) 16.7(H) 6.6  Hemoglobin 12.0 - 15.0 g/dL 16.1 15.5(H) 13.9  Hematocrit 36.0 - 46.0 % 39.8 45.0 41.8  Platelets 150 - 400 K/uL 265 311 298   CMP Latest Ref Rng &  Units 12/07/2017 12/06/2017 10/15/2017  Glucose 70 - 99 mg/dL 096(E) 454(U) 75  BUN 8 - 23 mg/dL 23 98(J) 21  Creatinine 0.44 - 1.00 mg/dL 1.91 4.78 2.95  Sodium 135 - 145 mmol/L 139 139 143  Potassium 3.5 - 5.1 mmol/L 3.3(L) 3.3(L) 3.8  Chloride 98 - 111 mmol/L 102 98 97  CO2 22 - 32 mmol/L 30 27 28   Calcium 8.9 - 10.3 mg/dL 8.1(L) 8.9 10.1  Total Protein 6.5 - 8.1 g/dL - 6.8 6.5  Total Bilirubin 0.3 - 1.2 mg/dL - 6.2(Z) 0.3  Alkaline Phos 38 - 126 U/L - 45 55  AST 15 - 41 U/L - 32 28  ALT 0 - 44 U/L - 42 32   Imaging studies: No new pertinent imaging studies  Assessment/Plan: (ICD-10's: K63.1) 67 y.o. female doing well 1 Day Post-Op s/p segmental sigmoid colectomy (12 cm) with primary linear anastomosis for stercoral perforation of the sigmoid colon, complicated by severe constipation with occasional blood per rectum and no prior colonoscopy despite family history of colon cancer as well as by comorbidities including HTN, HLD, major depression disorder, and former tobacco abuse (smoking) / current vaping.   - NG tube removed  - surgical findings discussed  - will start clear liquids diet, home medications  - pain control as needed, minimize narcotics  - will start bowel regimen, including only Colace for now, will add Miralax or similar 48 hours after surgery  - medical management of comorbidities (home medications)  - outpatient colonoscopy at least 6 weeks post-op  - DVT prophylaxis, ambulation encouraged   All of the above findings and recommendations were discussed with the  patient and patient's family, and all of patient's and family's questions were answered to their expressed satisfaction.  -- Scherrie GerlachJason E. [name], MD, RPVI [place]: [facility] General Surgery - Partnering for exceptional care. Office: [phone]

## 2017-12-07 NOTE — Progress Notes (Signed)
Patient unable to void.  Bladder scan revealed 700ml.  We tried ambulating the patient. She was still unable to void.  Order received from Dr [name] for in and out.  When I went in to catherize the patient she was able to void 800ml on her own

## 2017-12-08 ENCOUNTER — Encounter: Payer: Self-pay | Admitting: Surgery

## 2017-12-08 LAB — BASIC METABOLIC PANEL
ANION GAP: 4 — AB (ref 5–15)
BUN: 17 mg/dL (ref 8–23)
CO2: 32 mmol/L (ref 22–32)
Calcium: 7.9 mg/dL — ABNORMAL LOW (ref 8.9–10.3)
Chloride: 105 mmol/L (ref 98–111)
Creatinine, Ser: 0.67 mg/dL (ref 0.44–1.00)
GFR calc Af Amer: >60 mL/min (ref >60)
Glucose, Bld: 145 mg/dL — ABNORMAL HIGH (ref 70–99)
POTASSIUM: 3.1 mmol/L — AB (ref 3.5–5.1)
SODIUM: 141 mmol/L (ref 135–145)

## 2017-12-08 LAB — CBC
HCT: 36.2 % (ref 36.0–46.0)
Hemoglobin: 12 g/dL (ref 12.0–15.0)
MCH: 30.2 pg (ref 26.0–34.0)
MCHC: 33.1 g/dL (ref 30.0–36.0)
MCV: 91.2 fL (ref 80.0–100.0)
NRBC: 0 % (ref 0.0–0.2)
PLATELETS: 241 10*3/uL (ref 150–400)
RBC: 3.97 MIL/uL (ref 3.87–5.11)
RDW: 15.3 % (ref 11.5–15.5)
WBC: 9.5 10*3/uL (ref 4.0–10.5)

## 2017-12-08 MED ORDER — FAMOTIDINE 20 MG PO TABS
20.0000 mg | ORAL_TABLET | Freq: Two times a day (BID) | ORAL | Status: DC
  Administered 2017-12-08: 20 mg via ORAL
  Filled 2017-12-08: qty 1

## 2017-12-08 NOTE — Progress Notes (Signed)
PHARMACIST - PHYSICIAN COMMUNICATION  DR:  [name]  CONCERNING: IV to Oral Route Change Policy  RECOMMENDATION: This patient is receiving famotidine by the intravenous route.  Based on criteria approved by the Pharmacy and Therapeutics Committee, the intravenous medication(s) is/are being converted to the equivalent oral dose form(s).   DESCRIPTION: These criteria include:  The patient is eating (either orally or via tube) and/or has been taking other orally administered medications for a least 24 hours  The patient has no evidence of active gastrointestinal bleeding or impaired GI absorption (gastrectomy, short bowel, patient on TNA or NPO).  If you have questions about this conversion, please contact the Pharmacy Department  []   [phone]( [phone] )  [name] [x]   [phone]( [phone] )  [name] []   [phone]( [phone] )  [name] []   [phone]( [phone] )  [name] []   [phone]( [phone] )  [name]   Marcia BandyRodney D Mosie [name], [facility] CenterRPH 12/08/2017 2:36 PM

## 2017-12-08 NOTE — Progress Notes (Addendum)
SURGICAL PROGRESS NOTE  Patient seen and examined as described below with surgical PA-C, Gillermina PhyZachary Shulz.  Assessment/Plan: (ICD-10's: 60K63.1) 67 y.o. female doing very well 2 Days Post-Op s/p segmental sigmoidcolectomy (12 cm)with primary linearanastomosis for stercoral perforationof the sigmoid colon, complicated by severe chronic constipation with occasional blood per rectum and no prior colonoscopy despite family history of colon cancer as well as by comorbidities including HTN, HLD, major depression disorder, ongoing vaping, and former tobacco abuse (smoking).              - pain control as needed, minimize narcotics  - surgical findings again discussed with patient and family             - advanced to full liquids diet, but will not advance further until +flatus             - continue only Colace for now, but plan to add Miralax or similar 48 hours after surgery (though clearly concerned with Miralax or similar soon after new colonic anastomosis, also concerned about hard feces passing across new colonic anastomosis and hoping decreased transit time may prevent hardening)             - medical management of comorbidities (home medications)             - outpatient colonoscopy at least 6 weeks post-op             - DVT prophylaxis, ambulation encouraged  All of the above findings and recommendations were discussed with the patient, patient's family, and patient's RN, and all of patient's and family's questions were answered to their expressed satisfaction.  -- Scherrie GerlachJason E. [name], MD, RPVI [place]: [facility] General Surgery - Partnering for exceptional care. Office: [phone]      SURGICAL PROGRESS NOTE  Hospital Day(s): 2.   Post op day(s): 2 Days Post-Op.   Interval History: Patient seen and examined, no acute events or new complaints overnight. Patient reports that she no longer is having abdominal pain. She has been tolerating a clear liquid diet to this  point without nausea or emesis but she denied any flatus. No fever or chills. She was able to ambulate to the nursing station yesterday but plans to ambulate more today. Some issues with voiding after foley removal yesterday but this has resolved.    Review of Systems:  Constitutional: denies fever, chills  Respiratory: denies any shortness of breath  Cardiovascular: denies chest pain or palpitations  Gastrointestinal: denies abdominal pain, N/V, or diarrhea/and bowel function as per interval history Musculoskeletal: denies pain, decreased motor or sensation Integumentary: denies any other rashes or skin discolorations except laparotomy incision  Vital signs in last 24 hours: [min-max] current  Temp:  [97.6 F (36.4 C)-98.3 F (36.8 C)] 97.6 F (36.4 C) (11/18 0622) Pulse Rate:  [66-79] 66 (11/18 0622) Resp:  [16-18] 16 (11/18 0622) BP: (100-115)/(44-69) 100/44 (11/18 0622) SpO2:  [89 %-94 %] 94 % (11/18 0622)     Height: 5\' 8"  (172.7 cm) Weight: 81.6 kg BMI (Calculated): 27.38   Intake/Output this shift:  No intake/output data recorded.   Intake/Output last 2 shifts:  @IOLAST2SHIFTS @   Physical Exam:  Constitutional: alert, cooperative and no distress  Respiratory: breathing non-labored at rest  Cardiovascular: regular rate and sinus rhythm  Gastrointestinal: soft, non-tender, and non-distended. JP in RLQ with serosanguinous drainage in bulb Integumentary: Midline lower laparotomy incision is CDI, minimal drainage at inferior portion of honeycomb dressing  Labs:  CBC Latest  Ref Rng & Units 12/08/2017 12/07/2017 12/06/2017  WBC 4.0 - 10.5 K/uL 9.5 12.3(H) 16.7(H)  Hemoglobin 12.0 - 15.0 g/dL 40.9 81.1 15.5(H)  Hematocrit 36.0 - 46.0 % 36.2 39.8 45.0  Platelets 150 - 400 K/uL 241 265 311   CMP Latest Ref Rng & Units 12/08/2017 12/07/2017 12/06/2017  Glucose 70 - 99 mg/dL 914(N) 829(F) 621(H)  BUN 8 - 23 mg/dL 17 23 08(M)  Creatinine 0.44 - 1.00 mg/dL 5.78 4.69 6.29  Sodium  135 - 145 mmol/L 141 139 139  Potassium 3.5 - 5.1 mmol/L 3.1(L) 3.3(L) 3.3(L)  Chloride 98 - 111 mmol/L 105 102 98  CO2 22 - 32 mmol/L 32 30 27  Calcium 8.9 - 10.3 mg/dL 7.9(L) 8.1(L) 8.9  Total Protein 6.5 - 8.1 g/dL - - 6.8  Total Bilirubin 0.3 - 1.2 mg/dL - - 1.4(H)  Alkaline Phos 38 - 126 U/L - - 45  AST 15 - 41 U/L - - 32  ALT 0 - 44 U/L - - 42    Imaging studies: No new pertinent imaging studies   Assessment/Plan: (ICD-10's: K63.1) 67 y.o. female doing well 2 Day Post-Op s/p segmental sigmoidcolectomy (12 cm)with primary linearanastomosis for stercoral perforationof the sigmoid colon, complicated by severe constipation with occasional blood per rectum and no prior colonoscopy despite family history of colon cancer as well as by comorbidities including HTN, HLD, major depression disorder, and former tobacco abuse (smoking) / current vaping.   - Clears + nutritional supplementation, likely advance today  - Wean IVF as diet advances  - Monitor ongoing bowel functions and abdominal examination   - pain control as needed (minimize narcotics)  - IV Abx (Zosyn) for intra-abdominal infection  - Continue JP drain             - will start bowel regimen, including only Colace for now, will add Miralax or similar 48 hours after surgery  - Morning CBC and BMP             - medical management of comorbidities (home medications)             - outpatient colonoscopy at least 6 weeks post-op             - DVT prophylaxis, ambulation encouraged   All of the above findings and recommendations were discussed with the patient and the medical team, and all of patient's  questions were answered to her expressed satisfaction.   -- Lynden Oxford, PA-C [facility] 12/08/2017, 8:44 AM [phone] M-F: 7am - 4pm

## 2017-12-08 NOTE — Plan of Care (Signed)
12/08/2017 4:54 PM  Patient able to ambulate without oxygen and maintain O2 Sats above 90%. Encouraged patient to continue using incentive spirometer, TCDB and ambulate in hallway.   Madie RenoMisty D Chalise Pe, RN

## 2017-12-09 DIAGNOSIS — G4700 Insomnia, unspecified: Secondary | ICD-10-CM | POA: Insufficient documentation

## 2017-12-09 LAB — CBC
HEMATOCRIT: 39.1 % (ref 36.0–46.0)
HEMOGLOBIN: 12.8 g/dL (ref 12.0–15.0)
MCH: 29.8 pg (ref 26.0–34.0)
MCHC: 32.7 g/dL (ref 30.0–36.0)
MCV: 91.1 fL (ref 80.0–100.0)
Platelets: 304 10*3/uL (ref 150–400)
RBC: 4.29 MIL/uL (ref 3.87–5.11)
RDW: 15.3 % (ref 11.5–15.5)
WBC: 9.3 10*3/uL (ref 4.0–10.5)
nRBC: 0 % (ref 0.0–0.2)

## 2017-12-09 LAB — BASIC METABOLIC PANEL
ANION GAP: 4 — AB (ref 5–15)
BUN: 11 mg/dL (ref 8–23)
CO2: 33 mmol/L — AB (ref 22–32)
Calcium: 8.1 mg/dL — ABNORMAL LOW (ref 8.9–10.3)
Chloride: 105 mmol/L (ref 98–111)
Creatinine, Ser: 0.8 mg/dL (ref 0.44–1.00)
GFR calc Af Amer: >60 mL/min (ref >60)
GLUCOSE: 123 mg/dL — AB (ref 70–99)
POTASSIUM: 3.2 mmol/L — AB (ref 3.5–5.1)
Sodium: 142 mmol/L (ref 135–145)

## 2017-12-09 LAB — SURGICAL PATHOLOGY

## 2017-12-09 MED ORDER — AMOXICILLIN-POT CLAVULANATE 875-125 MG PO TABS
1.0000 | ORAL_TABLET | Freq: Two times a day (BID) | ORAL | Status: DC
  Administered 2017-12-09 – 2017-12-11 (×5): 1 via ORAL
  Filled 2017-12-09 (×5): qty 1

## 2017-12-09 MED ORDER — POLYETHYLENE GLYCOL 3350 17 G PO PACK
17.0000 g | PACK | Freq: Every day | ORAL | Status: DC
  Administered 2017-12-09 – 2017-12-11 (×3): 17 g via ORAL
  Filled 2017-12-09 (×3): qty 1

## 2017-12-09 NOTE — Assessment & Plan Note (Signed)
Stable on celexa and prn hydroxyzine. Continue current regimen

## 2017-12-09 NOTE — Assessment & Plan Note (Signed)
Trial belsomra, sleep hygiene reviewed

## 2017-12-09 NOTE — Care Management Important Message (Signed)
Copy of signed IM left with patient in room.  

## 2017-12-09 NOTE — Assessment & Plan Note (Signed)
Above goal today but typically WNL. Will continue to monitor and adjust if continuing to be elevated. DASH diet, increase exercise

## 2017-12-09 NOTE — Progress Notes (Addendum)
SURGICAL PROGRESS NOTE  Hospital Day(s): 3.   Post op day(s): 3 Days Post-Op.   Interval History: Patient seen and examined, no acute events or new complaints overnight. Patient reports that she has felt "gas pain" but has only been able to pass gas 1-2 times since yesterday. She otherwise denies any abdominal pain, nausea, or emesis. She has been up and ambulating around the unit. Currently tolerating a full liquid diet without difficulty. No additional complaints this morning.   Review of Systems:  Constitutional: denies fever, chills  Respiratory: denies any shortness of breath  Cardiovascular: denies chest pain or palpitations  Gastrointestinal: denies abdominal pain, N/V, or diarrhea/and bowel function as per interval history Musculoskeletal: denies pain, decreased motor or sensation Integumentary: denies any other rashes or skin discolorations except laparotomy incision  Vital signs in last 24 hours: [min-max] current  Temp:  [97.9 F (36.6 C)-98.4 F (36.9 C)] 98.2 F (36.8 C) (11/19 0807) Pulse Rate:  [69-78] 77 (11/19 0807) Resp:  [16-22] 22 (11/19 0620) BP: (87-160)/(54-75) 160/62 (11/19 0807) SpO2:  [87 %-94 %] 93 % (11/19 0807)     Height: 5\' 8"  (172.7 cm) Weight: 81.6 kg BMI (Calculated): 27.38   Intake/Output this shift:  Total I/O In: -  Out: 100 [Drains:100]   Intake/Output last 2 shifts:  @IOLAST2SHIFTS @   Physical Exam:  Constitutional: alert, cooperative and no distress  Respiratory: breathing non-labored at rest  Cardiovascular: regular rate and sinus rhythm  Gastrointestinal: soft, non-tender, and non-distended. JP in RLQ with serosanguinous drainage in bulb Integumentary: Midline lower laparotomy incision is CDI, no erythema  Labs:  CBC Latest Ref Rng & Units 12/09/2017 12/08/2017 12/07/2017  WBC 4.0 - 10.5 K/uL 9.3 9.5 12.3(H)  Hemoglobin 12.0 - 15.0 g/dL 62.912.8 52.812.0 41.313.3  Hematocrit 36.0 - 46.0 % 39.1 36.2 39.8  Platelets 150 - 400 K/uL 304 241  265   CMP Latest Ref Rng & Units 12/09/2017 12/08/2017 12/07/2017  Glucose 70 - 99 mg/dL 244(W123(H) 102(V145(H) 253(G193(H)  BUN 8 - 23 mg/dL 11 17 23   Creatinine 0.44 - 1.00 mg/dL 6.440.80 0.340.67 7.420.83  Sodium 135 - 145 mmol/L 142 141 139  Potassium 3.5 - 5.1 mmol/L 3.2(L) 3.1(L) 3.3(L)  Chloride 98 - 111 mmol/L 105 105 102  CO2 22 - 32 mmol/L 33(H) 32 30  Calcium 8.9 - 10.3 mg/dL 8.1(L) 7.9(L) 8.1(L)  Total Protein 6.5 - 8.1 g/dL - - -  Total Bilirubin 0.3 - 1.2 mg/dL - - -  Alkaline Phos 38 - 126 U/L - - -  AST 15 - 41 U/L - - -  ALT 0 - 44 U/L - - -     Imaging studies: No new pertinent imaging studies   Assessment/Plan: (ICD-10's: K63.1) 67 y.o.femaledoing very well3 Days Post-Ops/p segmental sigmoidcolectomy (12 cm)with primary linearanastomosisfor stercoral perforationof the sigmoid colon, complicated bysevere chronic constipation with occasional blood per rectum and no prior colonoscopy despite family history of colon cancer as well as bycomorbidities including HTN, HLD, major depression disorder, ongoing vaping, and former tobacco abuse (smoking).   - Continue full liquids + nutritional supplementation until passing significant flatus, wean IVF   - Pain control as needed (minimize narcotics)  - Transition from IV Zosyn to PO Augmentin today  - Continue JP drain, daily dressing changes over drain site  - Continue bowel regimen of colace, will start Miralax today as 48 hours post-op at this time.   - medical management of comorbidities (home medications) - outpatient colonoscopy at least 6  weeks post-op - DVT prophylaxis, ambulation encouraged   All of the above findings and recommendations were discussed with the patient, and the medical team, and all of patient's questions were answered to her expressed satisfaction.  -- Lynden Oxford, PA-C [facility] 12/09/2017, 8:41 AM [phone] M-F: 7am - 4pm

## 2017-12-09 NOTE — Assessment & Plan Note (Signed)
Recheck lipids, adjust as needed. Continue good lifestyle modifications 

## 2017-12-10 LAB — BASIC METABOLIC PANEL
ANION GAP: 4 — AB (ref 5–15)
BUN: 9 mg/dL (ref 8–23)
CHLORIDE: 107 mmol/L (ref 98–111)
CO2: 32 mmol/L (ref 22–32)
Calcium: 8.2 mg/dL — ABNORMAL LOW (ref 8.9–10.3)
Creatinine, Ser: 0.64 mg/dL (ref 0.44–1.00)
GFR calc Af Amer: >60 mL/min (ref >60)
GFR calc non Af Amer: >60 mL/min (ref >60)
GLUCOSE: 118 mg/dL — AB (ref 70–99)
POTASSIUM: 3.5 mmol/L (ref 3.5–5.1)
Sodium: 143 mmol/L (ref 135–145)

## 2017-12-10 LAB — CBC WITH DIFFERENTIAL/PLATELET
ABS IMMATURE GRANULOCYTES: 0.11 10*3/uL — AB (ref 0.00–0.07)
Basophils Absolute: 0.1 10*3/uL (ref 0.0–0.1)
Basophils Relative: 1 %
Eosinophils Absolute: 0.5 10*3/uL (ref 0.0–0.5)
Eosinophils Relative: 6 %
HEMATOCRIT: 38.7 % (ref 36.0–46.0)
HEMOGLOBIN: 12.6 g/dL (ref 12.0–15.0)
IMMATURE GRANULOCYTES: 1 %
LYMPHS ABS: 1.6 10*3/uL (ref 0.7–4.0)
LYMPHS PCT: 20 %
MCH: 29.9 pg (ref 26.0–34.0)
MCHC: 32.6 g/dL (ref 30.0–36.0)
MCV: 91.7 fL (ref 80.0–100.0)
MONO ABS: 0.7 10*3/uL (ref 0.1–1.0)
MONOS PCT: 8 %
NEUTROS ABS: 5.2 10*3/uL (ref 1.7–7.7)
NEUTROS PCT: 64 %
Platelets: 325 10*3/uL (ref 150–400)
RBC: 4.22 MIL/uL (ref 3.87–5.11)
RDW: 15.4 % (ref 11.5–15.5)
WBC: 8.1 10*3/uL (ref 4.0–10.5)
nRBC: 0 % (ref 0.0–0.2)

## 2017-12-10 NOTE — Progress Notes (Addendum)
SURGICAL PROGRESS NOTE  Patient seen and examined as described below with surgical PA-C, Gillermina PhyZachary Shulz.  Assessment/Plan:(ICD-10's: K63.1) 67 y.o.femaledoing very well4 Days Post-Ops/p segmental sigmoidcolectomy (12 cm)with primary linearanastomosisfor stercoral perforationof the sigmoid colon, complicated bysevere chronic constipation with occasional blood per rectum and no prior colonoscopy despite family history of colon cancer as well as bycomorbidities including HTN, HLD, major depression disorder, ongoing vaping, and former tobacco abuse (smoking).  - pain control as needed, minimize narcotics  - continue to monitor ongoing abdominal exam and bowel function - continue BID Colace and once daily Miralax until BM's resume/normalize - agree with advancement to soft diet and ongoing Ensure nutritional supplements - medical management of comorbidities (home medications)  - discharge planning with 7 days Augmentin once BM's - outpatient colonoscopy at least 6 weeks post-op - DVT prophylaxis, ambulation encouraged  All of the above findings and recommendations were discussed with the patient, patient's family, and patient's RN, and all of patient's and family's questions were answered to their expressed satisfaction.  -- Scherrie GerlachJason E. [name], MD, RPVI : [facility] General Surgery - Partnering for exceptional care. Office: [phone]      SURGICAL PROGRESS NOTE  Hospital Day(s): 4.   Post op day(s): 4 Days Post-Op.   Interval History: Patient seen and examined, no acute events or new complaints overnight. Patient reports that she has passed flatus multiple times. She continues to deny abdominal pain, nausea, or emesis. She has not had a bowel movement. Tolerating full liquids. Mobilizing multiple times a day independently,   Review of Systems:  Constitutional: denies fever,  chills  Respiratory: denies any shortness of breath  Cardiovascular: denies chest pain or palpitations  Gastrointestinal: denies abdominal pain, N/V, or diarrhea/and bowel function as per interval history Musculoskeletal: denies pain, decreased motor or sensation Integumentary: denies any other rashes or skin discolorations except laparotomy incision  Vital signs in last 24 hours: [min-max] current  Temp:  [98.3 F (36.8 C)-98.6 F (37 C)] 98.4 F (36.9 C) (11/20 0522) Pulse Rate:  [75-78] 75 (11/20 0522) Resp:  [18-20] 20 (11/20 0522) BP: (104-158)/(57-74) 158/74 (11/20 0522) SpO2:  [90 %-95 %] 90 % (11/20 0522)     Height: 5\' 8"  (172.7 cm) Weight: 81.6 kg BMI (Calculated): 27.38   Intake/Output this shift:  Total I/O In: 60.8 [I.V.:60.8] Out: 30 [Drains:30]   Intake/Output last 2 shifts:  @IOLAST2SHIFTS @   Physical Exam:  Constitutional: alert, cooperative and no distress  Respiratory: breathing non-labored at rest  Cardiovascular: regular rate and sinus rhythm  Gastrointestinal: soft, non-tender, and non-distended. JP in RLQ with serous drainage in bulb Integumentary:Midline lower laparotomy incision is CDI, no erythema, honeycomb in place  Labs:  CBC Latest Ref Rng & Units 12/10/2017 12/09/2017 12/08/2017  WBC 4.0 - 10.5 K/uL 8.1 9.3 9.5  Hemoglobin 12.0 - 15.0 g/dL 09.812.6 11.912.8 14.712.0  Hematocrit 36.0 - 46.0 % 38.7 39.1 36.2  Platelets 150 - 400 K/uL 325 304 241   CMP Latest Ref Rng & Units 12/10/2017 12/09/2017 12/08/2017  Glucose 70 - 99 mg/dL 829(F118(H) 621(H123(H) 086(V145(H)  BUN 8 - 23 mg/dL 9 11 17   Creatinine 0.44 - 1.00 mg/dL 7.840.64 6.960.80 2.950.67  Sodium 135 - 145 mmol/L 143 142 141  Potassium 3.5 - 5.1 mmol/L 3.5 3.2(L) 3.1(L)  Chloride 98 - 111 mmol/L 107 105 105  CO2 22 - 32 mmol/L 32 33(H) 32  Calcium 8.9 - 10.3 mg/dL 8.2(L) 8.1(L) 7.9(L)  Total Protein 6.5 - 8.1 g/dL - - -  Total Bilirubin 0.3 - 1.2 mg/dL - - -  Alkaline Phos 38 - 126 U/L - - -  AST 15 - 41 U/L - - -   ALT 0 - 44 U/L - - -    Imaging studies: No new pertinent imaging studies   Assessment/Plan: (ICD-10's: K63.1) 67 y.o.femaledoingverywell4DaysPost-Ops/p segmental sigmoidcolectomy (12 cm)with primary linearanastomosisfor stercoral perforationof the sigmoid colon, complicated byseverechronicconstipation with occasional blood per rectum and no prior colonoscopy despite family history of colon cancer as well as bycomorbidities including HTN, HLD, major depression disorder,ongoing vaping,and former tobacco abuse (smoking).              - Advance to soft diet, discontinue IVF             - Pain control as needed (minimize narcotics)             - Continue PO Augmentin today             - Continue JP drain (serous drainage today), daily dressing changes over drain site             - Continue bowel regimen of colace + Miralax             - medical management of comorbidities (home medications) - outpatient colonoscopy at least 6 weeks post-op - DVT prophylaxis, ambulation encouraged   - Discharge planning: Likely home in next 24-48 hours    All of the above findings and recommendations were discussed with the patient, and the medical team, and all of patient's questions were answered to her expressed satisfaction.  -- Lynden Oxford, PA-C [facility] 12/10/2017, 9:38 AM [phone] M-F: 7am - 4pm

## 2017-12-11 MED ORDER — AMOXICILLIN-POT CLAVULANATE 875-125 MG PO TABS: 1.0000 | ORAL_TABLET | Freq: Two times a day (BID) | ORAL | 0 refills | Status: AC

## 2017-12-11 MED ORDER — OXYCODONE HCL 5 MG PO TABS: 5.0000 mg | ORAL_TABLET | ORAL | Status: DC | PRN

## 2017-12-11 MED ORDER — MAGNESIUM HYDROXIDE 400 MG/5ML PO SUSP
30.0000 mL | Freq: Once | ORAL | Status: AC
  Administered 2017-12-11: 30 mL via ORAL
  Filled 2017-12-11: qty 30

## 2017-12-11 MED ORDER — DOCUSATE SODIUM 100 MG PO CAPS: 100.0000 mg | ORAL_CAPSULE | Freq: Two times a day (BID) | ORAL | 1 refills | Status: DC

## 2017-12-11 NOTE — Progress Notes (Signed)
Patient discharge teaching given, including activity, diet, follow-up appoints, and medications. Patient verbalized understanding of all discharge instructions. RN went over how to empty JP drain and pt demonstrated understanding with the teach back method. RN sent pt home with JP drain output sheet and dry dressing for JP.  IV access was d/c'd. Vitals are stable. Skin is intact except as charted in most recent assessments. Pt to be escorted out by volunteer, to be driven home by family.  Maxi Carreras [place]

## 2017-12-11 NOTE — Discharge Summary (Addendum)
Patient seen and examined as described below with surgical PA-C, Gillermina PhyZachary Shulz.  I have personally reviewed the patient's chart, evaluated/examined the patient, proposed the recommended management, and discussed these recommendations with the patient and her family to their expressed satisfaction as well as with patient's RN.  -- Scherrie GerlachJason E. [name], MD, RPVI [place]: [facility] General Surgery - Partnering for exceptional care. Office: [phone]  Discharge Summary  Patient ID: Marcia [name] MRN: [id number] DOB/AGE: 04/23/1950 67 y.o.  Admit date: 12/06/2017 Discharge date: 12/11/2017  Discharge Diagnoses Perforated Bowel  Consultants None  Procedures Segmental sigmoid colectomy (12 cm) with primary linear anastomosis on 11/16  HPI: 67 y.o.femalep[facility]16 for evaluation of lower abdominal pain. Patient reportsher abdomen has been distended x3 days with development of abdominal pain and dry heaves yesterday afternoon without emesis. Shedenies constipation, but says she strains to have BM's sometimes and describes occasional blood per rectum. She denies any recent unintentional weight loss, fever/chills, CP, or SOB at rest or with activities including walking a 1 - 2 blocks or up/down a flight of steps.Patient also last ate last night. Work up in the ED was concerning for perforated bowel.   Hospital Course: Informed consent was obtained and documented, and patient underwent uneventful segmental sigmoid colectomy with primary linear anastomosis (Dr. Satira MccallumJason , MD, 12/06/2017).  Post-operatively, patient's pain and bowel function improved/resolved and advancement of patient's diet and ambulation were well-tolerated. The remainder of patient's hospital course was essentially unremarkable, and discharge planning was initiated accordingly with patient safely able to be discharged home with appropriate discharge instructions, antibiotics, pain  control, and outpatient follow-up after all of her and her family's questions were answered to their expressed satisfaction.  Discharge Condition: Good  Physical Examination: Constitutional: alert, cooperative and no distress  Respiratory: breathing non-labored at rest  Cardiovascular: regular rate and sinus rhythm  Gastrointestinal: soft, non-tender, and non-distended. JP in RLQ with serous drainage in bulb Integumentary:Midline lower laparotomy incision is CDI,no erythema, honeycomb removed  Allergies as of 12/11/2017   No Known Allergies     Medication List    TAKE these medications   amoxicillin-clavulanate 875-125 MG tablet Commonly known as:  AUGMENTIN Take 1 tablet by mouth every 12 (twelve) hours for 5 days.   citalopram 40 MG tablet Commonly known as:  CELEXA Take 1 tablet (40 mg total) by mouth daily.   cloNIDine 0.1 MG tablet Commonly known as:  CATAPRES Take 1 tablet (0.1 mg total) by mouth daily.   docusate sodium 100 MG capsule Commonly known as:  COLACE Take 1 capsule (100 mg total) by mouth 2 (two) times daily.   gabapentin 300 MG capsule Commonly known as:  NEURONTIN Take 2 capsules (600 mg total) by mouth at bedtime.   hydrochlorothiazide 25 MG tablet Commonly known as:  HYDRODIURIL Take 1 tablet (25 mg total) by mouth daily.   hydrOXYzine 25 MG capsule Commonly known as:  VISTARIL Take 1 capsule (25 mg total) by mouth 3 (three) times daily as needed. What changed:  reasons to take this   pravastatin 40 MG tablet Commonly known as:  PRAVACHOL Take 1 tablet (40 mg total) by mouth daily.   Suvorexant 10 MG Tabs Take 10 mg by mouth at bedtime as needed. What changed:  reasons to take this   triamcinolone cream 0.1 % Commonly known as:  KENALOG Apply 1 application topically 2 (two) times daily.        Follow-up Information  Ancil Linsey, MD. Schedule an appointment as soon as possible for a visit in 1 week(s).   Specialty:   General Surgery Why:  s/p colectomy for perforated bowel on 11/16...Marland Kitchenhas JP and staples in place. needs follow up for removal of this (okay to see one of Dr Jinny Sanders partners) Contact information: [address] [place] [address] [phone]           Signed: Lynden Oxford , PA-C [facility]  12/11/2017, 9:45 AM [phone] M-F: 7am - 4pm

## 2017-12-11 NOTE — Care Management (Signed)
Patient to discharge today.  Tolerating diet, ambulating independently.  Per nursing no RNCM needs identified.

## 2017-12-11 NOTE — Care Management Important Message (Signed)
Copy of signed IM left with patient in room.  

## 2017-12-11 NOTE — Discharge Instructions (Signed)
In addition to included general post-operative instructions for Colectomy,  Diet: Resume home heart healthy diet.   Activity: No heavy lifting >20 pounds (children, pets, laundry, garbage) or strenuous activity until follow-up, but light activity and walking are encouraged. Do not drive or drink alcohol if taking narcotic pain medications.  Wound care: You may shower/get incision wet with soapy water and pat dry (do not rub incisions), but no baths or submerging incision underwater until follow-up.   Bowel Regimen: Take Colace twice daily, Take Miralax daily for the next 7 days  Medications: Resume all home medications. For mild to moderate pain: acetaminophen (Tylenol) or ibuprofen/naproxen (if no kidney disease). Combining Tylenol with alcohol can substantially increase your risk of causing liver disease. Narcotic pain medications, if prescribed, can be used for severe pain, though may cause nausea, constipation, and drowsiness. Do not combine Tylenol and Percocet (or similar) within a 6 hour period as Percocet (and similar) contain(s) Tylenol. If you do not need the narcotic pain medication, you do not need to fill the prescription.  Call office [phone] / [phone]) at any time if any questions, worsening pain, fevers/chills, bleeding, drainage from incision site, or other concerns.

## 2017-12-11 NOTE — Anesthesia Postprocedure Evaluation (Signed)
Anesthesia Post Note  Patient: Marcia MarshallJanice I [name]  Procedure(s) Performed: EXPLORATORY LAPAROTOMY, sigmoid colectomy, anastomosis (N/A Abdomen)  Patient location during evaluation: PACU Anesthesia Type: General Level of consciousness: awake and alert Pain management: pain level controlled Vital Signs Assessment: post-procedure vital signs reviewed and stable Respiratory status: spontaneous breathing, nonlabored ventilation, respiratory function stable and patient connected to nasal cannula oxygen Cardiovascular status: blood pressure returned to baseline and stable Postop Assessment: no apparent nausea or vomiting Anesthetic complications: no     Last Vitals:  Vitals:   12/11/17 0452 12/11/17 0757  BP: (!) 143/60 (!) 150/66  Pulse: 76 67  Resp: 18 19  Temp: 36.8 C 36.9 C  SpO2: 91% 94%    Last Pain:  Vitals:   12/11/17 1000  TempSrc:   PainSc: 0-No pain                 Yevette EdwardsJames G Ekam Bonebrake

## 2017-12-15 ENCOUNTER — Telehealth: Payer: Self-pay | Admitting: *Deleted

## 2017-12-15 ENCOUNTER — Telehealth: Payer: Self-pay | Admitting: Family Medicine

## 2017-12-15 NOTE — Telephone Encounter (Signed)
Patient states there is a small area of drainage. No smell or infected noticed no fever or chills. Patient just wanted to make sure it was okay. Advised to put a Band-Aid over the area. If the area starts to smell or any yellow or green drainage. Call the office. She is coming in on Wednesday.

## 2017-12-15 NOTE — Telephone Encounter (Signed)
Copied from CRM [id number]. Topic: Quick Communication - Rx Refill/Question >> Dec 15, 2017 11:22 AM Waymon AmatoBurton, Donna F wrote: Medication: bolsomra -not on med list   Has the patient contacted their pharmacy? No pt was given samples and told to request the refill once they knew that it helpted her  (Agent: If no, request that the patient contact the pharmacy for the refill.) (Agent: If yes, when and what did the pharmacy advise?)  [facility]with phone number or [facility] [phone]  Agent: Please be advised that RX refills may take up to 3 business days. We ask that you follow-up with your pharmacy.

## 2017-12-16 MED ORDER — SUVOREXANT 10 MG PO TABS: 10.0000 mg | ORAL_TABLET | Freq: Every evening | ORAL | 2 refills | Status: DC | PRN

## 2017-12-16 NOTE — Telephone Encounter (Signed)
See request. Thanks. 

## 2017-12-16 NOTE — Telephone Encounter (Signed)
Belsomra refilled

## 2017-12-17 ENCOUNTER — Other Ambulatory Visit: Payer: Self-pay

## 2017-12-17 ENCOUNTER — Ambulatory Visit (INDEPENDENT_AMBULATORY_CARE_PROVIDER_SITE_OTHER): Payer: Medicare Other | Admitting: Surgery

## 2017-12-17 ENCOUNTER — Encounter: Payer: Self-pay | Admitting: Surgery

## 2017-12-17 VITALS — BP 104/64 | HR 83 | Temp 97.7°F | Resp 20 | Ht 68.0 in | Wt 174.0 lb

## 2017-12-17 DIAGNOSIS — Z09 Encounter for follow-up examination after completed treatment for conditions other than malignant neoplasm: Secondary | ICD-10-CM

## 2017-12-17 DIAGNOSIS — K631 Perforation of intestine (nontraumatic): Secondary | ICD-10-CM

## 2017-12-17 NOTE — Patient Instructions (Addendum)
Patient is to return to the office in one week Dr. [name]  Call the office with any questions or concerns.

## 2017-12-22 NOTE — Progress Notes (Signed)
S/p sigmoid colectomy w primary anastomosis on 11/16 by Dr. [name] Doing well SOme mild abd pain Taking po, no fevers, + BM   PE NAD Abd: soft, erythema on lower portion of staples, drain some cloudy fluid, fascia intact. No peritonitis  A/p Doing well except superficial wound infection Daily packing No need for surgical intervnetion RTC next week Dr. [name]

## 2017-12-23 ENCOUNTER — Other Ambulatory Visit: Payer: Self-pay

## 2017-12-23 ENCOUNTER — Encounter: Payer: Self-pay | Admitting: Surgery

## 2017-12-23 ENCOUNTER — Ambulatory Visit (INDEPENDENT_AMBULATORY_CARE_PROVIDER_SITE_OTHER): Payer: Medicare Other | Admitting: Surgery

## 2017-12-23 VITALS — BP 128/76 | HR 78 | Temp 97.9°F | Ht 69.0 in | Wt 172.0 lb

## 2017-12-23 DIAGNOSIS — K631 Perforation of intestine (nontraumatic): Secondary | ICD-10-CM

## 2017-12-23 NOTE — Patient Instructions (Addendum)
Return after colonoscopy. Ref to [facility] . The patient is aware to call back for any questions or concerns.

## 2017-12-23 NOTE — Progress Notes (Signed)
Surgical Clinic Progress/Follow-up Note   HPI:  67 y.o. Female presents to clinic for subsequent post-op follow-up 17 Days/weeks s/p segmental sigmoid colectomy (12 cm) with primary linear anastomosis Earlene Plater(Davis, 12/06/2017) for stercoral colonic perforation associated with chronic severe constipation with family history of colon cancer without prior colonoscopy. Patient reports complete resolution of pre-operative pain and has been tolerating regular diet with +flatus and normal soft once daily BM's "for first time in [her] life", denies N/V, fever/chills, CP, or SOB. Patient, however, has not been packing her wound where 2 staples were removed last week by Dr. Everlene FarrierPabon.  Review of Systems:  Constitutional: denies fever/chills  Respiratory: denies shortness of breath, wheezing  Cardiovascular: denies chest pain, palpitations  Gastrointestinal: abdominal pain, N/V, and bowel function as per interval history Skin: Denies any other rashes or skin discolorations except post-surgical wounds as per interval history  Vital Signs:  BP 128/76   Pulse 78   Temp 97.9 F (36.6 C) (Skin)   Ht 5\' 9"  (1.753 m)   Wt 172 lb (78 kg)   SpO2 97%   BMI 25.40 kg/m    Physical Exam:  Constitutional:  -- Obese body habitus  -- Awake, alert, and oriented x3  Pulmonary:  -- No crackles -- Equal breath sounds bilaterally -- Breathing non-labored at rest Cardiovascular:  -- S1, S2 present  -- No pericardial rubs  Gastrointestinal:  -- Soft and non-distended, non-tender to palpation, no guarding/rebound tenderness -- Post-surgical incisions all well-approximated without any peri-incisional erythema or drainage except small amount of clear thin greenish non-purulent drainage expressed when wound where staples removed last week was probed with cotton swab, no surrounding tenderness or erythema -- No abdominal masses appreciated, pulsatile or otherwise  Musculoskeletal / Integumentary:  -- Wounds or skin  discoloration: None appreciated except post-surgical incisions as described above (GI) -- Extremities: B/L UE and LE FROM, hands and feet warm   Imaging: No new pertinent imaging available for review  Assessment:  67 y.o. yo Female with a problem list including...  Patient Active Problem List   Diagnosis Date Noted  . Insomnia 12/09/2017  . Perforated bowel (HCC) 12/06/2017  . Anxiety 04/19/2017  . Hypertension 11/13/2016  . Hyperlipidemia 11/13/2016  . 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  Plan:              - advance diet as tolerated   - surgical skin staples removed uneventfully and steri-strips placed  - GI referral for colonoscopy, pack mid-wound opened site with gauze once daily             - no heavy lifting >40 lbs x 4 more weeks, after which may gradually resume all activities without restrictions             - apply sunblock particularly to incisions with sun exposure to reduce pigmentation of scars             - return to clinic following colonsocopy, instructed to call office if any questions or concerns  All of the above recommendations were discussed with the patient and patient's sister, and all of patient's and family's questions were answered to their expressed satisfaction.  -- Scherrie GerlachJason E. [name], MD, RPVI [place]: [facility] General  Surgery - Partnering for exceptional care. Office: [phone]

## 2017-12-24 ENCOUNTER — Other Ambulatory Visit: Payer: Self-pay | Admitting: Family Medicine

## 2017-12-24 ENCOUNTER — Encounter: Payer: Self-pay | Admitting: Surgery

## 2017-12-24 MED ORDER — GABAPENTIN 300 MG PO CAPS: 600.0000 mg | ORAL_CAPSULE | Freq: Every day | ORAL | 0 refills | Status: DC

## 2017-12-24 MED ORDER — HYDROCHLOROTHIAZIDE 25 MG PO TABS: 25.0000 mg | ORAL_TABLET | Freq: Every day | ORAL | 0 refills | Status: DC

## 2017-12-24 NOTE — Telephone Encounter (Signed)
Copied from CRM [id number]. Topic: Quick Communication - Rx Refill/Question >> Dec 24, 2017  1:04 PM Mcneil, Ja-Kwan wrote: Medication: hydrochlorothiazide (HYDRODIURIL) 25 MG tablet and  gabapentin (NEURONTIN) 300 MG capsule  Has the patient contacted their pharmacy? yes   Preferred Pharmacy (with phone number or street name): [facility] [place] - [address] [phone] (Phone)  [phone] (Fax)  Agent: Please be advised that RX refills may take up to 3 business days. We ask that you follow-up with your pharmacy.

## 2018-01-15 ENCOUNTER — Ambulatory Visit: Payer: Medicare Other

## 2018-02-10 ENCOUNTER — Telehealth: Payer: Self-pay | Admitting: *Deleted

## 2018-02-10 MED ORDER — DOCUSATE SODIUM 100 MG PO CAPS: 100.0000 mg | ORAL_CAPSULE | Freq: Two times a day (BID) | ORAL | 0 refills | Status: AC

## 2018-02-10 NOTE — Telephone Encounter (Signed)
Patient called and had surgery on 12/06/17 with Dr.Davis and she wants to know if she can get some stool softeners called in to her pharmacy.

## 2018-02-10 NOTE — Telephone Encounter (Signed)
Talked with Dr. Earlene Plater, He states refill her Colace one more time and than follow up with PCP for refills. She can buy Colace over the counter. Rx sent in.

## 2018-02-11 ENCOUNTER — Other Ambulatory Visit: Payer: Self-pay

## 2018-02-11 ENCOUNTER — Encounter: Payer: Self-pay | Admitting: Gastroenterology

## 2018-02-11 ENCOUNTER — Ambulatory Visit (INDEPENDENT_AMBULATORY_CARE_PROVIDER_SITE_OTHER): Payer: Medicare Other | Admitting: Gastroenterology

## 2018-02-11 VITALS — BP 123/56 | HR 87 | Ht 69.0 in | Wt 175.4 lb

## 2018-02-11 DIAGNOSIS — K631 Perforation of intestine (nontraumatic): Secondary | ICD-10-CM | POA: Diagnosis not present

## 2018-02-11 NOTE — Progress Notes (Signed)
Wyline Mood MD, MRCP(U.K) [address]  [address]  [place], [place] [address]  Main: [phone]  Fax: [phone]   Gastroenterology Consultation  Referring Provider:     Particia Nearing,* Primary Care Physician:  Particia Nearing, [place] Primary Gastroenterologist:  Dr. Wyline Mood  Reason for Consultation:     Colonoscopy         HPI:   Marcia [name] is a 68 y.o. y/o female referred for consultation & management  by Dr. Maurice March, Salley Hews, PA-C.    She has been referred for a colonoscopy. She recently underwent a segmental colectomy with anastamosis by Dr Earlene Plater for a stercoral colonic perforation in 11/2017 . Never had a prior colonoscopy.   Doing well after surgery, no constipation. No family of colon cancer, sister had colon polyps. Denies any rectal bleeding. No weight loss. No GI issues to report.    Past Medical History:  Diagnosis Date  . Anxiety   . Hyperlipidemia   . Hypertension   . Perforated bowel (HCC) 12/06/2017    Past Surgical History:  Procedure Laterality Date  . BREAST BIOPSY Left 2015   CORE W/CLIP - NEG  . BREAST BIOPSY Left 07/2007   neg bx   . CESAREAN SECTION    . COLON SURGERY    . LAPAROTOMY N/A 12/06/2017   Procedure: EXPLORATORY LAPAROTOMY, sigmoid colectomy, anastomosis;  Surgeon: Ancil Linsey, MD;  Location: [facility] ORS;  Service: General;  Laterality: N/A;  . TUBAL LIGATION      Prior to Admission medications   Medication Sig Start Date End Date Taking? Authorizing Provider  citalopram (CELEXA) 40 MG tablet Take 1 tablet (40 mg total) by mouth daily. 10/27/17   Particia Nearing, PA-C  cloNIDine (CATAPRES) 0.1 MG tablet Take 1 tablet (0.1 mg total) by mouth daily. 10/27/17   Particia Nearing, PA-C  docusate sodium (COLACE) 100 MG capsule Take 1 capsule (100 mg total) by mouth 2 (two) times daily. 12/11/17   Donovan Kail, PA-C  docusate sodium (COLACE) 100 MG capsule Take 1 capsule (100 mg  total) by mouth 2 (two) times daily. 02/10/18 03/13/18  Ancil Linsey, MD  gabapentin (NEURONTIN) 300 MG capsule Take 2 capsules (600 mg total) by mouth at bedtime. 12/24/17   Particia Nearing, PA-C  hydrochlorothiazide (HYDRODIURIL) 25 MG tablet Take 1 tablet (25 mg total) by mouth daily. 12/24/17   Particia Nearing, PA-C  hydrOXYzine (VISTARIL) 25 MG capsule Take 1 capsule (25 mg total) by mouth 3 (three) times daily as needed. Patient taking differently: Take 25 mg by mouth 3 (three) times daily as needed for anxiety or itching.  10/16/17   Particia Nearing, PA-C  pravastatin (PRAVACHOL) 40 MG tablet Take 1 tablet (40 mg total) by mouth daily. 04/16/17   Particia Nearing, PA-C  Suvorexant (BELSOMRA) 10 MG TABS Take 10 mg by mouth at bedtime as needed (for sleep). 12/16/17   Particia Nearing, PA-C  triamcinolone cream (KENALOG) 0.1 % Apply 1 application topically 2 (two) times daily. 04/16/17   Particia Nearing, PA-C    Family History  Problem Relation Age of Onset  . Breast cancer Mother   . Liver cancer Sister   . Pancreatic cancer Sister   . Stomach cancer Brother   . Lung cancer Brother   . Brain cancer Sister   . Diabetes Father   . Heart disease Father   . COPD Neg Hx   .  Stroke Neg Hx      Social History   Tobacco Use  . Smoking status: Never Smoker  . Smokeless tobacco: Former Engineer, water Use Topics  . Alcohol use: No  . Drug use: No    Allergies as of 02/11/2018  . (No Known Allergies)    Review of Systems:    All systems reviewed and negative except where noted in HPI.   Physical Exam:  There were no vitals taken for this visit. No LMP recorded. Patient is postmenopausal. Psych:  Alert and cooperative. Normal mood and affect. General:   Alert,  Well-developed, well-nourished, pleasant and cooperative in NAD Head:  Normocephalic and atraumatic. Eyes:  Sclera clear, no icterus.   Conjunctiva pink. Ears:  Normal auditory  acuity. Nose:  No deformity, discharge, or lesions. Mouth:  No deformity or lesions,oropharynx pink & moist. Neck:  Supple; no masses or thyromegaly. Lungs:  Respirations even and unlabored.  Clear throughout to auscultation.   No wheezes, crackles, or rhonchi. No acute distress. Heart:  Regular rate and rhythm; no murmurs, clicks, rubs, or gallops. Abdomen:  Central midline scar Normal bowel sounds.  No bruits.  Soft, non-tender and non-distended without masses, hepatosplenomegaly or hernias noted.  No guarding or rebound tenderness.    Neurologic:  Alert and oriented x3;  grossly normal neurologically. Skin:  Intact without significant lesions or rashes. No jaundice. Lymph Nodes:  No significant cervical adenopathy. Psych:  Alert and cooperative. Normal mood and affect.  Imaging Studies: No results found.  Assessment and Plan:   Marcia [name] is a 68 y.o. y/o female has been referred for  a colonoscopy S/p segmental sigmoid colectomy in 11/2017 for a perforated stercoral ulcer from constipation. No prior colonoscopy.   Plan  1. Colonoscopy   I have discussed alternative options, risks & benefits,  which include, but are not limited to, bleeding, infection, perforation,respiratory complication & drug reaction.  The patient agrees with this plan & written consent will be obtained.    Follow up in PRN  Dr Wyline Mood MD,MRCP(U.K)

## 2018-02-17 ENCOUNTER — Encounter: Payer: Self-pay | Admitting: *Deleted

## 2018-02-18 ENCOUNTER — Ambulatory Visit: Payer: Medicare Other | Admitting: Anesthesiology

## 2018-02-18 ENCOUNTER — Ambulatory Visit
Admission: RE | Admit: 2018-02-18 | Discharge: 2018-02-18 | Disposition: A | Discharge status: Home / Self Care | Payer: Medicare Other | Attending: Gastroenterology | Admitting: Gastroenterology

## 2018-02-18 ENCOUNTER — Encounter
Admission: RE | Disposition: A | Discharge status: Home / Self Care | Payer: Self-pay | Source: Home / Self Care | Attending: Gastroenterology

## 2018-02-18 ENCOUNTER — Encounter: Payer: Self-pay | Admitting: *Deleted

## 2018-02-18 DIAGNOSIS — E785 Hyperlipidemia, unspecified: Secondary | ICD-10-CM | POA: Insufficient documentation

## 2018-02-18 DIAGNOSIS — F419 Anxiety disorder, unspecified: Secondary | ICD-10-CM | POA: Diagnosis not present

## 2018-02-18 DIAGNOSIS — Z87891 Personal history of nicotine dependence: Secondary | ICD-10-CM | POA: Diagnosis not present

## 2018-02-18 DIAGNOSIS — Z8249 Family history of ischemic heart disease and other diseases of the circulatory system: Secondary | ICD-10-CM | POA: Diagnosis not present

## 2018-02-18 DIAGNOSIS — Z79899 Other long term (current) drug therapy: Secondary | ICD-10-CM | POA: Insufficient documentation

## 2018-02-18 DIAGNOSIS — Z1211 Encounter for screening for malignant neoplasm of colon: Secondary | ICD-10-CM

## 2018-02-18 DIAGNOSIS — I10 Essential (primary) hypertension: Secondary | ICD-10-CM | POA: Insufficient documentation

## 2018-02-18 DIAGNOSIS — Z808 Family history of malignant neoplasm of other organs or systems: Secondary | ICD-10-CM | POA: Diagnosis not present

## 2018-02-18 DIAGNOSIS — Z833 Family history of diabetes mellitus: Secondary | ICD-10-CM | POA: Insufficient documentation

## 2018-02-18 DIAGNOSIS — Z801 Family history of malignant neoplasm of trachea, bronchus and lung: Secondary | ICD-10-CM | POA: Diagnosis not present

## 2018-02-18 DIAGNOSIS — Z803 Family history of malignant neoplasm of breast: Secondary | ICD-10-CM | POA: Insufficient documentation

## 2018-02-18 DIAGNOSIS — K631 Perforation of intestine (nontraumatic): Secondary | ICD-10-CM | POA: Diagnosis not present

## 2018-02-18 DIAGNOSIS — Z8042 Family history of malignant neoplasm of prostate: Secondary | ICD-10-CM | POA: Diagnosis not present

## 2018-02-18 DIAGNOSIS — Z980 Intestinal bypass and anastomosis status: Secondary | ICD-10-CM | POA: Insufficient documentation

## 2018-02-18 DIAGNOSIS — Z800 Family history of malignant neoplasm of digestive organs: Secondary | ICD-10-CM | POA: Insufficient documentation

## 2018-02-18 HISTORY — PX: COLONOSCOPY WITH PROPOFOL: SHX5780

## 2018-02-18 SURGERY — COLONOSCOPY WITH PROPOFOL
Anesthesia: General

## 2018-02-18 MED ORDER — SODIUM CHLORIDE 0.9 % IV SOLN
INTRAVENOUS | Status: DC
  Administered 2018-02-18: 12:00:00 via INTRAVENOUS

## 2018-02-18 MED ORDER — PROPOFOL 500 MG/50ML IV EMUL
INTRAVENOUS | Status: DC | PRN
  Administered 2018-02-18: 140 ug/kg/min via INTRAVENOUS

## 2018-02-18 MED ORDER — PROPOFOL 10 MG/ML IV BOLUS
INTRAVENOUS | Status: DC | PRN
  Administered 2018-02-18: 60 mg via INTRAVENOUS

## 2018-02-18 NOTE — Op Note (Signed)
[facility] Patient Name: Marcia [name] Procedure Date: 02/18/2018 12:22 PM MRN: [id number] Account #: [id number] Date of Birth: [birth year] Admit Type: Outpatient Age: 68 Room: [facility] ENDO ROOM 1 Gender: Female Note Status: Finalized Procedure:            Colonoscopy Indications:          Screening for colorectal malignant neoplasm Providers:            Wyline Mood MD, MD Medicines:            Monitored Anesthesia Care Complications:        No immediate complications. Procedure:            Pre-Anesthesia Assessment:                       - Prior to the procedure, a History and Physical was                        performed, and patient medications, allergies and                        sensitivities were reviewed. The patient's tolerance of                        previous anesthesia was reviewed.                       - The risks and benefits of the procedure and the                        sedation options and risks were discussed with the                        patient. All questions were answered and informed                        consent was obtained.                       - ASA Grade Assessment: II - A patient with mild                        systemic disease.                       After obtaining informed consent, the colonoscope was                        passed under direct vision. Throughout the procedure,                        the patient's blood pressure, pulse, and oxygen                        saturations were monitored continuously. The                        Colonoscope was introduced through the anus and                        advanced to the the cecum, identified by the  appendiceal orifice, IC valve and transillumination.                        The colonoscopy was performed with ease. The patient                        tolerated the procedure well. The quality of the bowel                        preparation was  poor. Findings:      The perianal and digital rectal examinations were normal.      There was evidence of a prior end-to-side colo-colonic anastomosis in       the sigmoid colon. This was patent and was characterized by congestion       and erythema. The anastomosis was traversed. Impression:           - Preparation of the colon was poor.                       - Patent end-to-side colo-colonic anastomosis,                        characterized by congestion and erythema.                       - No specimens collected. Recommendation:       - Discharge patient to home (with spouse).                       - Resume previous diet.                       - Continue present medications.                       - Repeat colonoscopy in 4 weeks because the bowel                        preparation was suboptimal. Procedure Code(s):    --- Professional ---                       [phone], Colonoscopy, flexible; diagnostic, including                        collection of specimen(s) by brushing or washing, when                        performed (separate procedure) Diagnosis Code(s):    --- Professional ---                       Z12.11, Encounter for screening for malignant neoplasm                        of colon                       Z98.0, Intestinal bypass and anastomosis status CPT copyright 2018 American Medical Association. All rights reserved. The codes documented in this report are preliminary and upon coder review may  be revised to meet current compliance requirements. Wyline MoodKiran Terah Robey, MD Wyline MoodKiran Quintasia Theroux MD, MD 02/18/2018 1:00:49 PM This report  has been signed electronically. Number of Addenda: 0 Note Initiated On: 02/18/2018 12:22 PM Scope Withdrawal Time: 0 hours 6 minutes 34 seconds  Total Procedure Duration: 0 hours 9 minutes 37 seconds       [facility]

## 2018-02-18 NOTE — Anesthesia Post-op Follow-up Note (Signed)
Anesthesia QCDR form completed.        

## 2018-02-18 NOTE — Anesthesia Postprocedure Evaluation (Signed)
Anesthesia Post Note  Patient: Marcia [name]  Procedure(s) Performed: COLONOSCOPY WITH PROPOFOL (N/A )  Patient location during evaluation: Endoscopy Anesthesia Type: General Level of consciousness: awake and alert Pain management: pain level controlled Vital Signs Assessment: post-procedure vital signs reviewed and stable Respiratory status: spontaneous breathing, nonlabored ventilation, respiratory function stable and patient connected to nasal cannula oxygen Cardiovascular status: blood pressure returned to baseline and stable Postop Assessment: no apparent nausea or vomiting Anesthetic complications: no     Last Vitals:  Vitals:   02/18/18 1311 02/18/18 1322  BP: (!) 126/58 126/64  Pulse: 75 71  Resp: 16 16  Temp:    SpO2: 97% 98%    Last Pain:  Vitals:   02/18/18 1322  TempSrc:   PainSc: 0-No pain                 Cleda Mccreedy Raenah Murley

## 2018-02-18 NOTE — H&P (Signed)
Wyline Mood, MD [address], [address], [place], [place], [address] [address], [address], [place], [place], [address] Phone: [phone]  Fax: [phone]  Primary Care Physician:  Particia Nearing, PA-C   Pre-Procedure History & Physical: HPI:  Marcia [name] is a 68 y.o. female is here for an colonoscopy.   Past Medical History:  Diagnosis Date  . Anxiety   . Hyperlipidemia   . Hypertension   . Perforated bowel (HCC) 12/06/2017    Past Surgical History:  Procedure Laterality Date  . BREAST BIOPSY Left 2015   CORE W/CLIP - NEG  . BREAST BIOPSY Left 07/2007   neg bx   . CESAREAN SECTION    . COLON SURGERY    . LAPAROTOMY N/A 12/06/2017   Procedure: EXPLORATORY LAPAROTOMY, sigmoid colectomy, anastomosis;  Surgeon: Ancil Linsey, MD;  Location: [facility] ORS;  Service: General;  Laterality: N/A;  . TUBAL LIGATION      Prior to Admission medications   Medication Sig Start Date End Date Taking? Authorizing Provider  citalopram (CELEXA) 40 MG tablet Take 1 tablet (40 mg total) by mouth daily. 10/27/17  Yes Particia Nearing, PA-C  cloNIDine (CATAPRES) 0.1 MG tablet Take 1 tablet (0.1 mg total) by mouth daily. 10/27/17  Yes Particia Nearing, PA-C  gabapentin (NEURONTIN) 300 MG capsule Take 2 capsules (600 mg total) by mouth at bedtime. 12/24/17  Yes Particia Nearing, PA-C  hydrochlorothiazide (HYDRODIURIL) 25 MG tablet Take 1 tablet (25 mg total) by mouth daily. 12/24/17  Yes Particia Nearing, PA-C  pravastatin (PRAVACHOL) 40 MG tablet Take 1 tablet (40 mg total) by mouth daily. 04/16/17  Yes Particia Nearing, PA-C  Suvorexant (BELSOMRA) 10 MG TABS Take 10 mg by mouth at bedtime as needed (for sleep). 12/16/17  Yes Particia Nearing, PA-C  triamcinolone cream (KENALOG) 0.1 % Apply 1 application topically 2 (two) times daily. 04/16/17  Yes Particia Nearing, PA-C  docusate sodium (COLACE) 100 MG capsule Take 1 capsule (100 mg  total) by mouth 2 (two) times daily. 12/11/17   Donovan Kail, PA-C  docusate sodium (COLACE) 100 MG capsule Take 1 capsule (100 mg total) by mouth 2 (two) times daily. 02/10/18 03/13/18  Ancil Linsey, MD  hydrOXYzine (VISTARIL) 25 MG capsule Take 1 capsule (25 mg total) by mouth 3 (three) times daily as needed. Patient taking differently: Take 25 mg by mouth 3 (three) times daily as needed for anxiety or itching.  10/16/17   Particia Nearing, PA-C    Allergies as of 02/11/2018  . (No Known Allergies)    Family History  Problem Relation Age of Onset  . Breast cancer Mother   . Liver cancer Sister   . Pancreatic cancer Sister   . Stomach cancer Brother   . Lung cancer Brother   . Brain cancer Sister   . Diabetes Father   . Heart disease Father   . COPD Neg Hx   . Stroke Neg Hx     Social History   Socioeconomic History  . Marital status: Single    Spouse name: Not on file  . Number of children: Not on file  . Years of education: Not on file  . Highest education level: Not on file  Occupational History  . Not on file  Social Needs  . Financial resource strain: Not on file  . Food insecurity:    Worry: Not on file    Inability: Not  on file  . Transportation needs:    Medical: Not on file    Non-medical: Not on file  Tobacco Use  . Smoking status: Never Smoker  . Smokeless tobacco: Former Engineer, waterUser  Substance and Sexual Activity  . Alcohol use: No  . Drug use: No  . Sexual activity: Not on file  Lifestyle  . Physical activity:    Days per week: Not on file    Minutes per session: Not on file  . Stress: Not on file  Relationships  . Social connections:    Talks on phone: Not on file    Gets together: Not on file    Attends religious service: Not on file    Active member of club or organization: Not on file    Attends meetings of clubs or organizations: Not on file    Relationship status: Not on file  . Intimate partner violence:    Fear of current or  ex partner: Not on file    Emotionally abused: Not on file    Physically abused: Not on file    Forced sexual activity: Not on file  Other Topics Concern  . Not on file  Social History Narrative  . Not on file    Review of Systems: See HPI, otherwise negative ROS  Physical Exam: BP 112/84   Pulse 90   Temp 97.7 F (36.5 C) (Tympanic)   Resp 18   Ht 5\' 9"  (1.753 m)   Wt 79.5 kg   SpO2 98%   BMI 25.89 kg/m  General:   Alert,  pleasant and cooperative in NAD Head:  Normocephalic and atraumatic. Neck:  Supple; no masses or thyromegaly. Lungs:  Clear throughout to auscultation, normal respiratory effort.    Heart:  +S1, +S2, Regular rate and rhythm, No edema. Abdomen:  Soft, nontender and nondistended. Normal bowel sounds, without guarding, and without rebound.   Neurologic:  Alert and  oriented x4;  grossly normal neurologically.  Impression/Plan: Marcia [name] is here for an colonoscopy to be performed for Screening colonoscopy average risk   Risks, benefits, limitations, and alternatives regarding  colonoscopy have been reviewed with the patient.  Questions have been answered.  All parties agreeable.   Wyline MoodKiran Kasondra Junod, MD  02/18/2018, 12:42 PM

## 2018-02-18 NOTE — Anesthesia Preprocedure Evaluation (Signed)
Anesthesia Evaluation  Patient identified by MRN, date of birth, ID band Patient awake    Reviewed: Allergy & Precautions, H&P , NPO status , Patient's Chart, lab work & pertinent test results  History of Anesthesia Complications Negative for: history of anesthetic complications  Airway Mallampati: III  TM Distance: >3 FB Neck ROM: limited    Dental  (+) Chipped   Pulmonary neg pulmonary ROS, neg shortness of breath,           Cardiovascular Exercise Tolerance: Good hypertension, (-) angina(-) Past MI and (-) DOE      Neuro/Psych PSYCHIATRIC DISORDERS negative neurological ROS  negative psych ROS   GI/Hepatic negative GI ROS, Neg liver ROS, neg GERD  ,  Endo/Other  negative endocrine ROS  Renal/GU negative Renal ROS  negative genitourinary   Musculoskeletal   Abdominal   Peds  Hematology negative hematology ROS (+)   Anesthesia Other Findings Past Medical History: No date: Anxiety No date: Hyperlipidemia No date: Hypertension 12/06/2017: Perforated bowel [facility](HCC)  Past Surgical History: 2015: BREAST BIOPSY; Left     Comment:  CORE W/CLIP - NEG 07/2007: BREAST BIOPSY; Left     Comment:  neg bx  No date: CESAREAN SECTION No date: COLON SURGERY 12/06/2017: LAPAROTOMY; N/A     Comment:  Procedure: EXPLORATORY LAPAROTOMY, sigmoid colectomy,               anastomosis;  Surgeon: Ancil Linseyavis, Jason Evan, MD;  Location:               [facility] ORS;  Service: General;  Laterality: N/A; No date: TUBAL LIGATION  BMI    Body Mass Index:  25.89 kg/m      Reproductive/Obstetrics negative OB ROS                             Anesthesia Physical Anesthesia Plan  ASA: III  Anesthesia Plan: General   Post-op Pain Management:    Induction: Intravenous  PONV Risk Score and Plan: Propofol infusion and TIVA  Airway Management Planned: Natural Airway and Nasal Cannula  Additional Equipment:    Intra-op Plan:   Post-operative Plan:   Informed Consent: I have reviewed the patients History and Physical, chart, labs and discussed the procedure including the risks, benefits and alternatives for the proposed anesthesia with the patient or authorized representative who has indicated his/her understanding and acceptance.     Dental Advisory Given  Plan Discussed with: Anesthesiologist, CRNA and Surgeon  Anesthesia Plan Comments: (Patient consented for risks of anesthesia including but not limited to:  - adverse reactions to medications - risk of intubation if required - damage to teeth, lips or other oral mucosa - sore throat or hoarseness - Damage to heart, brain, lungs or loss of life  Patient voiced understanding.)        Anesthesia Quick Evaluation

## 2018-02-18 NOTE — Transfer of Care (Signed)
Immediate Anesthesia Transfer of Care Note  Patient: Marcia [name]  Procedure(s) Performed: COLONOSCOPY WITH PROPOFOL (N/A )  Patient Location: PACU  Anesthesia Type:General  Level of Consciousness: awake, alert  and oriented  Airway & Oxygen Therapy: Patient Spontanous Breathing  Post-op Assessment: Report given to RN and Post -op Vital signs reviewed and stable  Post vital signs: Reviewed and stable  Last Vitals:  Vitals Value Taken Time  BP 111/73 02/18/2018  1:02 PM  Temp 36.2 C 02/18/2018  1:02 PM  Pulse 80 02/18/2018  1:02 PM  Resp 19 02/18/2018  1:02 PM  SpO2 97 % 02/18/2018  1:02 PM  Vitals shown include unvalidated device data.  Last Pain:  Vitals:   02/18/18 1302  TempSrc: Tympanic  PainSc: 0-No pain         Complications: No apparent anesthesia complications

## 2018-02-19 ENCOUNTER — Telehealth: Payer: Self-pay | Admitting: Gastroenterology

## 2018-02-19 ENCOUNTER — Encounter: Payer: Self-pay | Admitting: Gastroenterology

## 2018-02-19 NOTE — Telephone Encounter (Signed)
Patient called stating she had a colonoscopy scheduled 02-18-18 with Dr Tobi Bastos & was not cleaned out. Needs to have a repeat in 4 wk & prescription called into [facility] in [place].

## 2018-02-23 ENCOUNTER — Telehealth: Payer: Self-pay | Admitting: *Deleted

## 2018-02-23 NOTE — Telephone Encounter (Signed)
  Patient called and had surgery on 12/06/17 with Dr.Davis.  The patient states that she has not had a bowel movement since last Wednesday. She states she has been using a laxative. Patient states she has used 2 tablespoons Miralax daily.   Note to Dr. Earlene Plater to get his recommendation.

## 2018-02-23 NOTE — Telephone Encounter (Signed)
Patient contacted and instructed to continue twice daily Colace, drinking lots of water, and eating fiber or a fiber supplement. If Miralax does not help despite the above listed, patient instructed to try magnesium citrate or milk of magnesia.   The patient verbalizes understanding.

## 2018-03-05 NOTE — Telephone Encounter (Signed)
Called pt to schedule her repeat colonoscopy.  Unable to contact, LVM to return call

## 2018-03-06 NOTE — Telephone Encounter (Signed)
Pt is calling to schedule a repeat colonoscopy for preferred  Wednesday or Friday in the afternoon around 1 pm

## 2018-03-09 ENCOUNTER — Other Ambulatory Visit: Payer: Self-pay

## 2018-03-09 DIAGNOSIS — K631 Perforation of intestine (nontraumatic): Secondary | ICD-10-CM

## 2018-03-09 MED ORDER — PEG 3350-KCL-NA BICARB-NACL 420 G PO SOLR: 4000.0000 mL | Freq: Once | ORAL | 0 refills | Status: AC

## 2018-03-09 NOTE — Telephone Encounter (Signed)
Spoke with pt regarding her repeat colonoscopy. We have scheduled pt procedure for 03-25-18. Pt is aware that we have sent the new bowel prep prescription to her preferred pharmacy.

## 2018-03-11 ENCOUNTER — Ambulatory Visit: Payer: Medicare Other

## 2018-03-25 ENCOUNTER — Encounter
Admission: RE | Disposition: A | Discharge status: Home / Self Care | Payer: Self-pay | Source: Home / Self Care | Attending: Gastroenterology

## 2018-03-25 ENCOUNTER — Ambulatory Visit: Payer: Medicare Other | Admitting: Anesthesiology

## 2018-03-25 ENCOUNTER — Ambulatory Visit: Payer: Medicare Other

## 2018-03-25 ENCOUNTER — Other Ambulatory Visit: Payer: Self-pay

## 2018-03-25 ENCOUNTER — Ambulatory Visit
Admission: RE | Admit: 2018-03-25 | Discharge: 2018-03-25 | Disposition: A | Discharge status: Home / Self Care | Payer: Medicare Other | Attending: Gastroenterology | Admitting: Gastroenterology

## 2018-03-25 DIAGNOSIS — I10 Essential (primary) hypertension: Secondary | ICD-10-CM | POA: Diagnosis not present

## 2018-03-25 DIAGNOSIS — Z1210 Encounter for screening for malignant neoplasm of intestinal tract, unspecified: Secondary | ICD-10-CM | POA: Diagnosis not present

## 2018-03-25 DIAGNOSIS — E785 Hyperlipidemia, unspecified: Secondary | ICD-10-CM | POA: Diagnosis not present

## 2018-03-25 DIAGNOSIS — Z1211 Encounter for screening for malignant neoplasm of colon: Secondary | ICD-10-CM | POA: Insufficient documentation

## 2018-03-25 DIAGNOSIS — Z79899 Other long term (current) drug therapy: Secondary | ICD-10-CM | POA: Insufficient documentation

## 2018-03-25 DIAGNOSIS — Z9049 Acquired absence of other specified parts of digestive tract: Secondary | ICD-10-CM | POA: Diagnosis not present

## 2018-03-25 DIAGNOSIS — K631 Perforation of intestine (nontraumatic): Secondary | ICD-10-CM | POA: Diagnosis not present

## 2018-03-25 DIAGNOSIS — Z980 Intestinal bypass and anastomosis status: Secondary | ICD-10-CM | POA: Diagnosis not present

## 2018-03-25 DIAGNOSIS — F419 Anxiety disorder, unspecified: Secondary | ICD-10-CM | POA: Insufficient documentation

## 2018-03-25 HISTORY — PX: COLONOSCOPY WITH PROPOFOL: SHX5780

## 2018-03-25 SURGERY — COLONOSCOPY WITH PROPOFOL
Anesthesia: General

## 2018-03-25 MED ORDER — PROPOFOL 500 MG/50ML IV EMUL
INTRAVENOUS | Status: AC
  Filled 2018-03-25: qty 50

## 2018-03-25 MED ORDER — LIDOCAINE 2% (20 MG/ML) 5 ML SYRINGE
INTRAMUSCULAR | Status: DC | PRN
  Administered 2018-03-25: 50 mg via INTRAVENOUS

## 2018-03-25 MED ORDER — SODIUM CHLORIDE 0.9 % IV SOLN
INTRAVENOUS | Status: DC
  Administered 2018-03-25: 13:00:00 via INTRAVENOUS

## 2018-03-25 MED ORDER — PROPOFOL 10 MG/ML IV BOLUS
INTRAVENOUS | Status: DC | PRN
  Administered 2018-03-25: 70 mg via INTRAVENOUS
  Administered 2018-03-25: 30 mg via INTRAVENOUS

## 2018-03-25 MED ORDER — PROPOFOL 500 MG/50ML IV EMUL
INTRAVENOUS | Status: DC | PRN
  Administered 2018-03-25: 150 ug/kg/min via INTRAVENOUS

## 2018-03-25 NOTE — Anesthesia Preprocedure Evaluation (Signed)
Anesthesia Evaluation  Patient identified by MRN, date of birth, ID band Patient awake    Reviewed: Allergy & Precautions, H&P , NPO status , Patient's Chart, lab work & pertinent test results, reviewed documented beta blocker date and time   Airway Mallampati: II   Neck ROM: full    Dental  (+) Teeth Intact   Pulmonary neg pulmonary ROS,    Pulmonary exam normal        Cardiovascular Exercise Tolerance: Good hypertension, On Medications negative cardio ROS Normal cardiovascular exam Rhythm:regular Rate:Normal     Neuro/Psych Anxiety negative neurological ROS  negative psych ROS   GI/Hepatic negative GI ROS, Neg liver ROS,   Endo/Other  negative endocrine ROS  Renal/GU negative Renal ROS  negative genitourinary   Musculoskeletal   Abdominal   Peds  Hematology negative hematology ROS (+)   Anesthesia Other Findings Past Medical History: No date: Anxiety No date: Hyperlipidemia No date: Hypertension 12/06/2017: Perforated bowel [facility]) Past Surgical History: 2015: BREAST BIOPSY; Left     Comment:  CORE W/CLIP - NEG 07/2007: BREAST BIOPSY; Left     Comment:  neg bx  No date: CESAREAN SECTION No date: COLON SURGERY 02/18/2018: COLONOSCOPY WITH PROPOFOL; N/A     Comment:  Procedure: COLONOSCOPY WITH PROPOFOL;  Surgeon: Wyline Mood, MD;  Location: [facility] ENDOSCOPY;  Service:               Gastroenterology;  Laterality: N/A; 12/06/2017: LAPAROTOMY; N/A     Comment:  Procedure: EXPLORATORY LAPAROTOMY, sigmoid colectomy,               anastomosis;  Surgeon: Ancil Linsey, MD;  Location:               [facility] ORS;  Service: General;  Laterality: N/A; No date: TUBAL LIGATION   Reproductive/Obstetrics negative OB ROS                             Anesthesia Physical Anesthesia Plan  ASA: III  Anesthesia Plan: General   Post-op Pain Management:    Induction:   PONV Risk  Score and Plan:   Airway Management Planned:   Additional Equipment:   Intra-op Plan:   Post-operative Plan:   Informed Consent: I have reviewed the patients History and Physical, chart, labs and discussed the procedure including the risks, benefits and alternatives for the proposed anesthesia with the patient or authorized representative who has indicated his/her understanding and acceptance.     Dental Advisory Given  Plan Discussed with: CRNA  Anesthesia Plan Comments:         Anesthesia Quick Evaluation

## 2018-03-25 NOTE — Op Note (Signed)
[facility] Gastroenterology Patient Name: Marcia [name] Procedure Date: 03/25/2018 1:29 PM MRN: [id number] Account #: [id number] Date of Birth: [birth year] Admit Type: Outpatient Age: 68 Room: [facility] ENDO ROOM 1 Gender: Female Note Status: Finalized Procedure:            Colonoscopy Indications:          Screening for colorectal malignant neoplasm Providers:            Wyline Mood MD, MD Referring MD:         Particia Nearing (Referring MD) Medicines:            Monitored Anesthesia Care Complications:        No immediate complications. Procedure:            Pre-Anesthesia Assessment:                       - Prior to the procedure, a History and Physical was                        performed, and patient medications, allergies and                        sensitivities were reviewed. The patient's tolerance of                        previous anesthesia was reviewed.                       - The risks and benefits of the procedure and the                        sedation options and risks were discussed with the                        patient. All questions were answered and informed                        consent was obtained.                       - ASA Grade Assessment: II - A patient with mild                        systemic disease.                       After obtaining informed consent, the colonoscope was                        passed under direct vision. Throughout the procedure,                        the patient's blood pressure, pulse, and oxygen                        saturations were monitored continuously. The                        Colonoscope was introduced through the anus and  advanced to the the cecum, identified by the                        appendiceal orifice, IC valve and transillumination.                        The colonoscopy was performed with ease. The patient                        tolerated the procedure well. The  quality of the bowel                        preparation was poor. Findings:      The perianal and digital rectal examinations were normal.      A large amount of semi-liquid stool was found in the entire colon,       precluding visualization. Impression:           - Preparation of the colon was poor.                       - Stool in the entire examined colon.                       - No specimens collected. Recommendation:       - Discharge patient to home (with escort).                       - Resume previous diet.                       - Continue present medications.                       - Repeat colonoscopy in 4 weeks because the bowel                        preparation was suboptimal.                       - 2 days of clears and 2 gallons of golytely till stool                        looks like apple juice in color Procedure Code(s):    --- Professional ---                       [phone], Colonoscopy, flexible; diagnostic, including                        collection of specimen(s) by brushing or washing, when                        performed (separate procedure) Diagnosis Code(s):    --- Professional ---                       Z12.11, Encounter for screening for malignant neoplasm                        of colon CPT copyright 2018 American Medical Association. All rights reserved. The codes documented in this report are preliminary and upon coder review  may  be revised to meet current compliance requirements. Wyline Mood, MD Wyline Mood MD, MD 03/25/2018 1:56:58 PM This report has been signed electronically. Number of Addenda: 0 Note Initiated On: 03/25/2018 1:29 PM Scope Withdrawal Time: 0 hours 5 minutes 26 seconds  Total Procedure Duration: 0 hours 13 minutes 51 seconds       [facility]

## 2018-03-25 NOTE — Anesthesia Post-op Follow-up Note (Signed)
Anesthesia QCDR form completed.        

## 2018-03-25 NOTE — H&P (Signed)
Wyline Mood, MD [address], [place], [place], [address] [address], [place], [place], [address] Phone: [phone]  Fax: [phone]  Primary Care Physician:  Particia Nearing, PA-C   Pre-Procedure History & Physical: HPI:  Marcia [name] is a 68 y.o. female is here for an colonoscopy.   Past Medical History:  Diagnosis Date  . Anxiety   . Hyperlipidemia   . Hypertension   . Perforated bowel (HCC) 12/06/2017    Past Surgical History:  Procedure Laterality Date  . BREAST BIOPSY Left 2015   CORE W/CLIP - NEG  . BREAST BIOPSY Left 07/2007   neg bx   . CESAREAN SECTION    . COLON SURGERY    . COLONOSCOPY WITH PROPOFOL N/A 02/18/2018   Procedure: COLONOSCOPY WITH PROPOFOL;  Surgeon: Wyline Mood, MD;  Location: [facility] ENDOSCOPY;  Service: Gastroenterology;  Laterality: N/A;  . LAPAROTOMY N/A 12/06/2017   Procedure: EXPLORATORY LAPAROTOMY, sigmoid colectomy, anastomosis;  Surgeon: Ancil Linsey, MD;  Location: [facility] ORS;  Service: General;  Laterality: N/A;  . TUBAL LIGATION      Prior to Admission medications   Medication Sig Start Date End Date Taking? Authorizing Provider  citalopram (CELEXA) 40 MG tablet Take 1 tablet (40 mg total) by mouth daily. 10/27/17  Yes Particia Nearing, PA-C  cloNIDine (CATAPRES) 0.1 MG tablet Take 1 tablet (0.1 mg total) by mouth daily. 10/27/17  Yes Particia Nearing, PA-C  docusate sodium (COLACE) 100 MG capsule Take 1 capsule (100 mg total) by mouth 2 (two) times daily. 12/11/17  Yes Lynden Oxford R, PA-C  gabapentin (NEURONTIN) 300 MG capsule Take 2 capsules (600 mg total) by mouth at bedtime. 12/24/17  Yes Particia Nearing, PA-C  hydrochlorothiazide (HYDRODIURIL) 25 MG tablet Take 1 tablet (25 mg total) by mouth daily. 12/24/17  Yes Particia Nearing, PA-C  hydrOXYzine (VISTARIL) 25 MG capsule Take 1 capsule (25 mg total) by mouth 3 (three) times daily as needed. Patient taking  differently: Take 25 mg by mouth 3 (three) times daily as needed for anxiety or itching.  10/16/17  Yes Particia Nearing, PA-C  pravastatin (PRAVACHOL) 40 MG tablet Take 1 tablet (40 mg total) by mouth daily. 04/16/17  Yes Particia Nearing, PA-C  Suvorexant (BELSOMRA) 10 MG TABS Take 10 mg by mouth at bedtime as needed (for sleep). 12/16/17  Yes Particia Nearing, PA-C  triamcinolone cream (KENALOG) 0.1 % Apply 1 application topically 2 (two) times daily. 04/16/17  Yes Particia Nearing, PA-C    Allergies as of 03/09/2018  . (No Known Allergies)    Family History  Problem Relation Age of Onset  . Breast cancer Mother   . Liver cancer Sister   . Pancreatic cancer Sister   . Stomach cancer Brother   . Lung cancer Brother   . Brain cancer Sister   . Diabetes Father   . Heart disease Father   . COPD Neg Hx   . Stroke Neg Hx     Social History   Socioeconomic History  . Marital status: Single    Spouse name: Not on file  . Number of children: Not on file  . Years of education: Not on file  . Highest education level: Not on file  Occupational History  . Not on file  Social Needs  . Financial resource strain: Not on file  . Food insecurity:    Worry: Not on file  Inability: Not on file  . Transportation needs:    Medical: Not on file    Non-medical: Not on file  Tobacco Use  . Smoking status: Never Smoker  . Smokeless tobacco: Former Engineer, water and Sexual Activity  . Alcohol use: No  . Drug use: No  . Sexual activity: Not on file  Lifestyle  . Physical activity:    Days per week: Not on file    Minutes per session: Not on file  . Stress: Not on file  Relationships  . Social connections:    Talks on phone: Not on file    Gets together: Not on file    Attends religious service: Not on file    Active member of club or organization: Not on file    Attends meetings of clubs or organizations: Not on file    Relationship status: Not on file  .  Intimate partner violence:    Fear of current or ex partner: Not on file    Emotionally abused: Not on file    Physically abused: Not on file    Forced sexual activity: Not on file  Other Topics Concern  . Not on file  Social History Narrative  . Not on file    Review of Systems: See HPI, otherwise negative ROS  Physical Exam: BP (!) 174/76   Pulse 81   Temp 97.6 F (36.4 C) (Tympanic)   Resp 18   Ht 5\' 9"  (1.753 m)   Wt 77.1 kg   SpO2 98%   BMI 25.10 kg/m  General:   Alert,  pleasant and cooperative in NAD Head:  Normocephalic and atraumatic. Neck:  Supple; no masses or thyromegaly. Lungs:  Clear throughout to auscultation, normal respiratory effort.    Heart:  +S1, +S2, Regular rate and rhythm, No edema. Abdomen:  Soft, nontender and nondistended. Normal bowel sounds, without guarding, and without rebound.   Neurologic:  Alert and  oriented x4;  grossly normal neurologically.  Impression/Plan: Marcia [name] is here for an colonoscopy to be performed for Screening colonoscopy average risk   Risks, benefits, limitations, and alternatives regarding  colonoscopy have been reviewed with the patient.  Questions have been answered.  All parties agreeable.   Wyline Mood, MD  03/25/2018, 1:22 PM

## 2018-03-25 NOTE — Transfer of Care (Signed)
Immediate Anesthesia Transfer of Care Note  Patient: Marcia [name]  Procedure(s) Performed: COLONOSCOPY WITH PROPOFOL (N/A )  Patient Location: Endoscopy Unit  Anesthesia Type:General  Level of Consciousness: awake, alert  and oriented  Airway & Oxygen Therapy: Patient connected to nasal cannula oxygen  Post-op Assessment: Post -op Vital signs reviewed and stable  Post vital signs: stable  Last Vitals:  Vitals Value Taken Time  BP    Temp 36.2 C 03/25/2018  1:56 PM  Pulse    Resp    SpO2      Last Pain:  Vitals:   03/25/18 1356  TempSrc: Tympanic  PainSc:          Complications: No apparent anesthesia complications

## 2018-03-26 ENCOUNTER — Encounter: Payer: Self-pay | Admitting: Gastroenterology

## 2018-03-28 NOTE — Anesthesia Postprocedure Evaluation (Signed)
Anesthesia Post Note  Patient: HELAINE BURFIELD  Procedure(s) Performed: COLONOSCOPY WITH PROPOFOL (N/A )  Patient location during evaluation: PACU Anesthesia Type: General Level of consciousness: awake and alert Pain management: pain level controlled Vital Signs Assessment: post-procedure vital signs reviewed and stable Respiratory status: spontaneous breathing, nonlabored ventilation, respiratory function stable and patient connected to nasal cannula oxygen Cardiovascular status: blood pressure returned to baseline and stable Postop Assessment: no apparent nausea or vomiting Anesthetic complications: no     Last Vitals:  Vitals:   03/25/18 1416 03/25/18 1419  BP: 133/81 133/81  Pulse: 79 81  Resp: 20   Temp:    SpO2: 98% 98%    Last Pain:  Vitals:   03/26/18 0743  TempSrc:   PainSc: 0-No pain                 Yevette Edwards

## 2018-03-30 ENCOUNTER — Telehealth: Payer: Self-pay | Admitting: Family Medicine

## 2018-03-30 MED ORDER — SUVOREXANT 10 MG PO TABS: 10.0000 mg | ORAL_TABLET | Freq: Every evening | ORAL | 5 refills | Status: DC | PRN

## 2018-03-30 NOTE — Telephone Encounter (Signed)
rx sent

## 2018-03-30 NOTE — Telephone Encounter (Signed)
Copied from CRM [id number]. Topic: Quick Communication - Rx Refill/Question >> Mar 30, 2018  9:00 AM Elliot Gault wrote:  Medication: Suvorexant (BELSOMRA) 10 MG TABS   Preferred Pharmacy (with phone number or street name):   [facility] - [place], [place] - [address] [phone] (Phone) [phone] (Fax)  Agent: Please be advised that RX refills may take up to 3 business days. We ask that you follow-up with your pharmacy.

## 2018-03-31 ENCOUNTER — Telehealth: Payer: Self-pay

## 2018-03-31 NOTE — Telephone Encounter (Addendum)
Called pt to schedule repeat colonoscopy due to poor prep. Dr. Tobi Bastos suggests that pt commence on 2 days of clear liquids and to drink up to 2 gallons of Golytely bowel prep solution.  Unable to contact. LVM to return call

## 2018-04-01 ENCOUNTER — Telehealth: Payer: Self-pay | Admitting: Gastroenterology

## 2018-04-01 ENCOUNTER — Ambulatory Visit (INDEPENDENT_AMBULATORY_CARE_PROVIDER_SITE_OTHER): Payer: Medicare Other

## 2018-04-01 ENCOUNTER — Other Ambulatory Visit: Payer: Self-pay

## 2018-04-01 VITALS — BP 132/65 | HR 85 | Temp 98.7°F | Resp 16 | Ht 67.0 in | Wt 185.3 lb

## 2018-04-01 DIAGNOSIS — Z1239 Encounter for other screening for malignant neoplasm of breast: Secondary | ICD-10-CM

## 2018-04-01 DIAGNOSIS — Z0000 Encounter for general adult medical examination without abnormal findings: Secondary | ICD-10-CM | POA: Diagnosis not present

## 2018-04-01 NOTE — Telephone Encounter (Signed)
Pt is calling she received a call yesterday to r/s her colonoscopy becasuse the first one did not go through she states she has some other apts coming up and will contact us to r/s  Her colonoscopy when she is ready

## 2018-04-01 NOTE — Progress Notes (Signed)
Subjective:   Marcia [name] is a 68 y.o. female who presents for an Initial Medicare Annual Wellness Visit.  Review of Systems      Cardiac Risk Factors include: advanced age (>455men, 21>65 women);hypertension;dyslipidemia     Objective:    Today's Vitals   04/01/18 1312 04/01/18 1315  BP: 132/65   Pulse: 85   Resp: 16   Temp: 98.7 F (37.1 C)   TempSrc: Temporal   Weight: 185 lb 4.8 oz (84.1 kg)   Height: 5\' 7"  (1.702 m)   PainSc:  0-No pain   Body mass index is 29.02 kg/m.  Advanced Directives 04/01/2018 03/25/2018 12/06/2017 12/06/2017 11/13/2016  Does Patient Have a Medical Advance Directive? No No No No Yes  Does patient want to make changes to medical advance directive? - - - - Yes (MAU/Ambulatory/Procedural Areas - Information given)  Would patient like information on creating a medical advance directive? No - Patient declined - No - Patient declined No - Patient declined -    Current Medications (verified) Outpatient Encounter Medications as of 04/01/2018  Medication Sig  . citalopram (CELEXA) 40 MG tablet Take 1 tablet (40 mg total) by mouth daily.  . cloNIDine (CATAPRES) 0.1 MG tablet Take 1 tablet (0.1 mg total) by mouth daily.  [name]. docusate sodium (COLACE) 100 MG capsule Take 1 capsule (100 mg total) by mouth 2 (two) times daily.  [name]. gabapentin (NEURONTIN) 300 MG capsule Take 2 capsules (600 mg total) by mouth at bedtime.  . hydrochlorothiazide (HYDRODIURIL) 25 MG tablet Take 1 tablet (25 mg total) by mouth daily.  . hydrOXYzine (VISTARIL) 25 MG capsule Take 1 capsule (25 mg total) by mouth 3 (three) times daily as needed. (Patient taking differently: Take 25 mg by mouth 3 (three) times daily as needed for anxiety or itching. )  . pravastatin (PRAVACHOL) 40 MG tablet Take 1 tablet (40 mg total) by mouth daily.  . Suvorexant (BELSOMRA) 10 MG TABS Take 10 mg by mouth at bedtime as needed (for sleep).  . triamcinolone cream (KENALOG) 0.1 % Apply 1 application  topically 2 (two) times daily.   No facility-administered encounter medications on file as of 04/01/2018.     Allergies (verified) Patient has no known allergies.   History: Past Medical History:  Diagnosis Date  . Anxiety   . Hyperlipidemia   . Hypertension   . Perforated bowel (HCC) 12/06/2017   Past Surgical History:  Procedure Laterality Date  . BREAST BIOPSY Left 2015   CORE W/CLIP - NEG  . BREAST BIOPSY Left 07/2007   neg bx   . CESAREAN SECTION    . COLON SURGERY    . COLONOSCOPY WITH PROPOFOL N/A 02/18/2018   Procedure: COLONOSCOPY WITH PROPOFOL;  Surgeon: Wyline MoodAnna, Kiran, MD;  [facility] END[facility];  Service: Gastroenterology;  Laterality: N/A;  . COLONOSCOPY WITH PROPOFOL N/A 03/25/2018   Procedure: COLONOSCOPY WITH PROPOFOL;  Surgeon: Wyline MoodAnna, Kiran, MD;  [facility];  Service: Gastroenterology;  Laterality: N/A;  . LAPAROTOMY N/A 12/06/2017   Procedure: EXPLORATORY LAPAROTOMY, sigmoid colectomy, anastomosis;  Surgeon: Ancil Linseyavis, Jason Evan, MD;  Location: [facility] ORS;  Service: General;  Laterality: N/A;  . TUBAL LIGATION     Family History  Problem Relation Age of Onset  . Breast cancer Mother   . Liver cancer Sister   . Pancreatic cancer Sister   . Stomach cancer Brother   . Lung cancer Brother   . Brain cancer Sister   . Diabetes Father   .  Heart disease Father   . COPD Neg Hx   . Stroke Neg Hx    Social History   Socioeconomic History  . Marital status: Divorced    Spouse name: Not on file  . Number of children: Not on file  . Years of education: Not on file  . Highest education level: High school graduate  Occupational History  . Not on file  Social Needs  . Financial resource strain: Not hard at all  . Food insecurity:    Worry: Never true    Inability: Never true  . Transportation needs:    Medical: No    Non-medical: No  Tobacco Use  . Smoking status: Former Smoker    Types: Cigarettes  . Smokeless tobacco: Never Used  . Tobacco  comment: quit over 15 years ago   Substance and Sexual Activity  . Alcohol use: No  . Drug use: No  . Sexual activity: Not on file  Lifestyle  . Physical activity:    Days per week: 2 days    Minutes per session: 30 min  . Stress: Not at all  Relationships  . Social connections:    Talks on phone: More than three times a week    Gets together: More than three times a week    Attends religious service: More than 4 times per year    Active member of club or organization: No    Attends meetings of clubs or organizations: Never    Relationship status: Divorced  Other Topics Concern  . 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    Goes to walking twice a week for 30 minutes     Tobacco Counseling Counseling given: Not Answered Comment: quit over 15 years ago    Clinical Intake:  Pre-visit preparation completed: Yes  Pain : No/denies pain Pain Score: 0-No pain     Nutritional Status: BMI 25 -29 Overweight Nutritional Risks: None Diabetes: No  How often do you need to have someone help you when you read instructions, pamphlets, or other written materials from your doctor or pharmacy?: 1 - Never What is the last grade level you completed in school?: high school   Interpreter Needed?: No  Information entered by :: Leo Weyandt,LPN    Activities of Daily Living In your present state of health, do you have any difficulty performing the following activities: 04/01/2018 12/06/2017  Hearing? N N  Vision? N N  Difficulty concentrating or making decisions? N N  Walking or climbing stairs? N N  Dressing or bathing? N N  Doing errands, shopping? N N  Preparing Food and eating ? N -  Using the Toilet? N -  In the past six months, have you accidently leaked urine? N -  Do you have problems with loss of bowel control? N -  Managing your Medications? N -  Managing your Finances? N -  Housekeeping or  managing your Housekeeping? N -  Some recent data might be hidden     Immunizations and Health Maintenance Immunization History  Administered Date(s) Administered  . Influenza, High Dose Seasonal PF 11/13/2016, 10/15/2017  . Pneumococcal Conjugate-13 11/13/2016  . Pneumococcal Polysaccharide-23 12/03/2017  . Tdap 11/17/2013   There are no preventive care reminders to display for this patient.  Patient Care Team: Particia Nearing, PA-C as PCP - General (Family Medicine) Pasty Spillers, MD as  Consulting Physician (Gastroenterology) Ancil Linsey, MD as Consulting Physician (General Surgery)  Indicate any recent Medical Services you may have received from other than [facility] providers in the past year (date may be approximate).     Assessment:   This is a routine wellness examination for Shalay.  Hearing/Vision screen Vision Screening Comments: Clydene Pugh   Dietary issues and exercise activities discussed: Current Exercise Habits: The patient does not participate in regular exercise at present, Exercise limited by: None identified  Goals    . Increase water intake     Recommend drinking at least 4-5 glasses of water a day       Depression Screen PHQ 2/9 Scores 04/01/2018 12/03/2017 10/15/2017 07/17/2016  PHQ - 2 Score 0 6 0 0  PHQ- 9 Score - 10 0 -    Fall Risk Fall Risk  04/01/2018 12/23/2017 12/03/2017 07/17/2016  Falls in the past year? 0 0 0 Yes  Number falls in past yr: - - - 1  Injury with Fall? - - - Yes  Follow up - - Falls evaluation completed -   FALL RISK PREVENTION PERTAINING TO THE HOME:  Any stairs in or around the home? No  If so, are there any without handrails? n/a  Home free of loose throw rugs in walkways, pet beds, electrical cords, etc? Yes  Adequate lighting in your home to reduce risk of falls? Yes   ASSISTIVE DEVICES UTILIZED TO PREVENT FALLS:  Life alert? No  Use of a cane, walker or w/c? No  Grab bars in the bathroom? Yes Shower  chair or bench in shower? No  Elevated toilet seat or a handicapped toilet? No    DME ORDERS:  DME order needed?  No   TIMED UP AND GO:  Was the test performed? Yes .  Length of time to ambulate 10 feet: 11 sec.   GAIT:  Appearance of gait: Gait steady and fast without the use of an assistive device.  Education: Fall risk prevention has been discussed.  Intervention(s) required? No   DME/home health order needed?  No     Cognitive Function:     6CIT Screen 04/01/2018 11/13/2016  What Year? 0 points 0 points  What month? 0 points 0 points  What time? 0 points 0 points  Count back from 20 0 points 0 points  Months in reverse 0 points 0 points  Repeat phrase 2 points 0 points  Total Score 2 0    Screening Tests Health Maintenance  Topic Date Due  . DEXA SCAN  12/04/2018 (Originally 03/22/2016)  . MAMMOGRAM  02/11/2019  . TETANUS/TDAP  11/18/2023  . COLONOSCOPY  03/24/2028  . INFLUENZA VACCINE  Completed  . Hepatitis C Screening  Completed  . PNA vac Low Risk Adult  Completed    Qualifies for Shingles Vaccine? Yes  Zostavax completed n/a. Due for Shingrix. Education has been provided regarding the importance of this vaccine. Pt has been advised to call insurance company to determine out of pocket expense. Advised may also receive vaccine at local pharmacy or Health Dept. Verbalized acceptance and understanding.  Tdap: up to date   Flu Vaccine: up to date   Pneumococcal Vaccine: up to date   Cancer Screenings:  Colorectal Screening: scheduled with GI  Mammogram: Completed 02/10/2017. Repeat every year; ordered  Bone Density: ordered  Lung Cancer Screening: (Low Dose CT Chest recommended if Age 48-80 years, 30 pack-year currently smoking OR have quit w/in 15years.) does not qualify.  Additional Screening:  Hepatitis C Screening: does qualify; Completed 12/03/2017  Vision Screening: Recommended annual ophthalmology exams for early detection of glaucoma and  other disorders of the eye. Is the patient up to date with their annual eye exam?  Yes  Who is the provider or what is the name of the office in which the pt attends annual eye exams? [facility]   Dental Screening: Recommended annual dental exams for proper oral hygiene  Community Resource Referral:  CRR required this visit?  No       Plan:  I have personally reviewed and addressed the Medicare Annual Wellness questionnaire and have noted the following in the patient's chart:  A. Medical and social history B. Use of alcohol, tobacco or illicit drugs  C. Current medications and supplements D. Functional ability and status E.  Nutritional status F.  Physical activity G. Advance directives H. List of other physicians I.  Hospitalizations, surgeries, and ER visits in previous 12 months J.  Vitals K. Screenings such as hearing and vision if needed, cognitive and depression L. Referrals and appointments   In addition, I have reviewed and discussed with patient certain preventive protocols, quality metrics, and best practice recommendations. A written personalized care plan for preventive services as well as general preventive health recommendations were provided to patient.   Signed,    Collene Schlichter, LPN   04/29/8117  Nurse Health Advisor   Nurse Notes: none

## 2018-04-01 NOTE — Patient Instructions (Signed)
Marcia [name] , Thank you for taking time to come for your Medicare Wellness Visit. I appreciate your ongoing commitment to your health goals. Please review the following plan we discussed and let me know if I can assist you in the future.   Screening recommendations/referrals: Colonoscopy: scheduled  Mammogram: Please call [phone] to schedule your mammogram.  Bone Density: Please call [phone] to schedule your bone density  Recommended yearly ophthalmology/optometry visit for glaucoma screening and checkup Recommended yearly dental visit for hygiene and checkup  Vaccinations: Influenza vaccine: up to date  Pneumococcal vaccine: up to date  Tdap vaccine: up to date  Shingles vaccine: Shingrix eligible, check with your insurance company for coverage    Advanced directives: Advance directive discussed with you today. Even though you declined this today please call our office should you change your mind and we can give you the proper paperwork for you to fill out.  Conditions/risks identified: none   Next appointment: Follow up in one year for your annual wellness exam.    Preventive Care 65 Years and Older, Female Preventive care refers to lifestyle choices and visits with your health care provider that can promote health and wellness. What does preventive care include?  A yearly physical exam. This is also called an annual well check.  Dental exams once or twice a year.  Routine eye exams. Ask your health care provider how often you should have your eyes checked.  Personal lifestyle choices, including:  Daily care of your teeth and gums.  Regular physical activity.  Eating a healthy diet.  Avoiding tobacco and drug use.  Limiting alcohol use.  Practicing safe sex.  Taking low-dose aspirin every day.  Taking vitamin and mineral supplements as recommended by your health care provider. What happens during an annual well check? The services and screenings done by  your health care provider during your annual well check will depend on your age, overall health, lifestyle risk factors, and family history of disease. Counseling  Your health care provider may ask you questions about your:  Alcohol use.  Tobacco use.  Drug use.  Emotional well-being.  Home and relationship well-being.  Sexual activity.  Eating habits.  History of falls.  Memory and ability to understand (cognition).  Work and work Astronomer.  Reproductive health. Screening  You may have the following tests or measurements:  Height, weight, and BMI.  Blood pressure.  Lipid and cholesterol levels. These may be checked every 5 years, or more frequently if you are over 55 years old.  Skin check.  Lung cancer screening. You may have this screening every year starting at age 73 if you have a 30-pack-year history of smoking and currently smoke or have quit within the past 15 years.  Fecal occult blood test (FOBT) of the stool. You may have this test every year starting at age 59.  Flexible sigmoidoscopy or colonoscopy. You may have a sigmoidoscopy every 5 years or a colonoscopy every 10 years starting at age 42.  Hepatitis C blood test.  Hepatitis B blood test.  Sexually transmitted disease (STD) testing.  Diabetes screening. This is done by checking your blood sugar (glucose) after you have not eaten for a while (fasting). You may have this done every 1-3 years.  Bone density scan. This is done to screen for osteoporosis. You may have this done starting at age 80.  Mammogram. This may be done every 1-2 years. Talk to your health care provider about how often you should have  regular mammograms. Talk with your health care provider about your test results, treatment options, and if necessary, the need for more tests. Vaccines  Your health care provider may recommend certain vaccines, such as:  Influenza vaccine. This is recommended every year.  Tetanus,  diphtheria, and acellular pertussis (Tdap, Td) vaccine. You may need a Td booster every 10 years.  Zoster vaccine. You may need this after age 85.  Pneumococcal 13-valent conjugate (PCV13) vaccine. One dose is recommended after age 39.  Pneumococcal polysaccharide (PPSV23) vaccine. One dose is recommended after age 59. Talk to your health care provider about which screenings and vaccines you need and how often you need them. This information is not intended to replace advice given to you by your health care provider. Make sure you discuss any questions you have with your health care provider. Document Released: 02/03/2015 Document Revised: 09/27/2015 Document Reviewed: 11/08/2014 Elsevier Interactive Patient Education  2017 [place] Prevention in the Home Falls can cause injuries. They can happen to people of all ages. There are many things you can do to make your home safe and to help prevent falls. What can I do on the outside of my home?  Regularly fix the edges of walkways and driveways and fix any cracks.  Remove anything that might make you trip as you walk through a door, such as a raised step or threshold.  Trim any bushes or trees on the path to your home.  Use bright outdoor lighting.  Clear any walking paths of anything that might make someone trip, such as rocks or tools.  Regularly check to see if handrails are loose or broken. Make sure that both sides of any steps have handrails.  Any raised decks and porches should have guardrails on the edges.  Have any leaves, snow, or ice cleared regularly.  Use sand or salt on walking paths during winter.  Clean up any spills in your garage right away. This includes oil or grease spills. What can I do in the bathroom?  Use night lights.  Install grab bars by the toilet and in the tub and shower. Do not use towel bars as grab bars.  Use non-skid mats or decals in the tub or shower.  If you need to sit down in  the shower, use a plastic, non-slip stool.  Keep the floor dry. Clean up any water that spills on the floor as soon as it happens.  Remove soap buildup in the tub or shower regularly.  Attach bath mats securely with double-sided non-slip rug tape.  Do not have throw rugs and other things on the floor that can make you trip. What can I do in the bedroom?  Use night lights.  Make sure that you have a light by your bed that is easy to reach.  Do not use any sheets or blankets that are too big for your bed. They should not hang down onto the floor.  Have a firm chair that has side arms. You can use this for support while you get dressed.  Do not have throw rugs and other things on the floor that can make you trip. What can I do in the kitchen?  Clean up any spills right away.  Avoid walking on wet floors.  Keep items that you use a lot in easy-to-reach places.  If you need to reach something above you, use a strong step stool that has a grab bar.  Keep electrical cords out of the  way.  Do not use floor polish or wax that makes floors slippery. If you must use wax, use non-skid floor wax.  Do not have throw rugs and other things on the floor that can make you trip. What can I do with my stairs?  Do not leave any items on the stairs.  Make sure that there are handrails on both sides of the stairs and use them. Fix handrails that are broken or loose. Make sure that handrails are as long as the stairways.  Check any carpeting to make sure that it is firmly attached to the stairs. Fix any carpet that is loose or worn.  Avoid having throw rugs at the top or bottom of the stairs. If you do have throw rugs, attach them to the floor with carpet tape.  Make sure that you have a light switch at the top of the stairs and the bottom of the stairs. If you do not have them, ask someone to add them for you. What else can I do to help prevent falls?  Wear shoes that:  Do not have high  heels.  Have rubber bottoms.  Are comfortable and fit you well.  Are closed at the toe. Do not wear sandals.  If you use a stepladder:  Make sure that it is fully opened. Do not climb a closed stepladder.  Make sure that both sides of the stepladder are locked into place.  Ask someone to hold it for you, if possible.  Clearly mark and make sure that you can see:  Any grab bars or handrails.  First and last steps.  Where the edge of each step is.  Use tools that help you move around (mobility aids) if they are needed. These include:  Canes.  Walkers.  Scooters.  Crutches.  Turn on the lights when you go into a dark area. Replace any light bulbs as soon as they burn out.  Set up your furniture so you have a clear path. Avoid moving your furniture around.  If any of your floors are uneven, fix them.  If there are any pets around you, be aware of where they are.  Review your medicines with your doctor. Some medicines can make you feel dizzy. This can increase your chance of falling. Ask your doctor what other things that you can do to help prevent falls. This information is not intended to replace advice given to you by your health care provider. Make sure you discuss any questions you have with your health care provider. Document Released: 11/03/2008 Document Revised: 06/15/2015 Document Reviewed: 02/11/2014 Elsevier Interactive Patient Education  2017 [employer].

## 2018-04-02 NOTE — Telephone Encounter (Signed)
Patient called & would like to schedule her colonoscopy for 05-18-2018.

## 2018-04-06 ENCOUNTER — Other Ambulatory Visit: Payer: Self-pay

## 2018-04-06 ENCOUNTER — Telehealth: Payer: Self-pay

## 2018-04-06 DIAGNOSIS — K631 Perforation of intestine (nontraumatic): Secondary | ICD-10-CM

## 2018-04-06 NOTE — Telephone Encounter (Signed)
Returned patients call.  LVM for her to call office back in regards to scheduling her colonoscopy with Dr. Tobi Bastos on 05/18/18 Dx: Perforated Colon K63.1  Thanks Marcelino Duster

## 2018-04-06 NOTE — Telephone Encounter (Signed)
Colonoscopy has been scheduled for 05/18/18 at [facility] with Dr. Tobi Bastos.  Patient states that Dr. Tobi Bastos wanted her to have a two day bowel prep.  One prescription for Suprep has been called to [facility], and I advised patient that I would call her once we got more SuPrep Samples in and provide her with an additional.  Thanks Marcelino Duster

## 2018-04-07 ENCOUNTER — Other Ambulatory Visit: Payer: Self-pay

## 2018-04-07 MED ORDER — PEG 3350-KCL-NA BICARB-NACL 420 G PO SOLR: 4000.0000 mL | Freq: Once | ORAL | 0 refills | Status: AC

## 2018-04-15 ENCOUNTER — Telehealth: Payer: Self-pay | Admitting: Family Medicine

## 2018-04-15 MED ORDER — PRAVASTATIN SODIUM 40 MG PO TABS: 40.0000 mg | ORAL_TABLET | Freq: Every day | ORAL | 0 refills | Status: DC

## 2018-04-15 MED ORDER — GABAPENTIN 300 MG PO CAPS: 600.0000 mg | ORAL_CAPSULE | Freq: Every day | ORAL | 0 refills | Status: DC

## 2018-04-15 NOTE — Telephone Encounter (Signed)
Copied from CRM [id number]. Topic: Quick Communication - Rx Refill/Question >> Apr 15, 2018  3:29 PM Reggie Pile, NT wrote: Medication:  gabapentin (NEURONTIN) 300 MG capsule pravastatin (PRAVACHOL) 40 MG tablet  Has the patient contacted their pharmacy? Yes patient called stating that she is running low and would like a refill. She stated her next appointment is in a few months and that at her previous visit the nurse informed her to call whenever she is running low.  Preferred Pharmacy (with phone number or street name):  [facility] - [place], [place] - [address] [phone] (Phone) [phone] (Fax)  Agent: Please be advised that RX refills may take up to 3 business days. We ask that you follow-up with your pharmacy.

## 2018-04-15 NOTE — Telephone Encounter (Signed)
Rxs sent

## 2018-04-21 ENCOUNTER — Telehealth: Payer: Self-pay | Admitting: Gastroenterology

## 2018-04-21 NOTE — Telephone Encounter (Signed)
Patients call has been returned.  I've informed her that we have received word to cancel her procedure on 05/18/18 however, this may change and if it does we will contact to let her know.  Thanks [employer]

## 2018-04-21 NOTE — Telephone Encounter (Signed)
Pt is calling to see if she needs to r/s her procedure for 05/18/18

## 2018-04-22 ENCOUNTER — Other Ambulatory Visit: Payer: Self-pay | Admitting: Family Medicine

## 2018-04-22 MED ORDER — HYDROXYZINE PAMOATE 25 MG PO CAPS: 25.0000 mg | ORAL_CAPSULE | Freq: Three times a day (TID) | ORAL | 1 refills | Status: DC | PRN

## 2018-04-29 ENCOUNTER — Telehealth: Payer: Self-pay

## 2018-04-29 NOTE — Telephone Encounter (Signed)
Patients colonoscopy has been rescheduled  From 04/27 to 06/10 with Dr. Tobi Bastos at [facility].  Thanks [employer]

## 2018-05-25 ENCOUNTER — Other Ambulatory Visit: Payer: Medicare Other

## 2018-05-26 ENCOUNTER — Other Ambulatory Visit: Payer: Self-pay | Admitting: Family Medicine

## 2018-06-03 ENCOUNTER — Ambulatory Visit (INDEPENDENT_AMBULATORY_CARE_PROVIDER_SITE_OTHER): Payer: Medicare Other | Admitting: Family Medicine

## 2018-06-03 ENCOUNTER — Other Ambulatory Visit: Payer: Self-pay

## 2018-06-03 ENCOUNTER — Encounter: Payer: Self-pay | Admitting: Family Medicine

## 2018-06-03 VITALS — BP 157/73

## 2018-06-03 DIAGNOSIS — G4700 Insomnia, unspecified: Secondary | ICD-10-CM | POA: Diagnosis not present

## 2018-06-03 DIAGNOSIS — E782 Mixed hyperlipidemia: Secondary | ICD-10-CM

## 2018-06-03 DIAGNOSIS — M545 Low back pain, unspecified: Secondary | ICD-10-CM | POA: Insufficient documentation

## 2018-06-03 DIAGNOSIS — F419 Anxiety disorder, unspecified: Secondary | ICD-10-CM

## 2018-06-03 DIAGNOSIS — I10 Essential (primary) hypertension: Secondary | ICD-10-CM

## 2018-06-03 DIAGNOSIS — G8929 Other chronic pain: Secondary | ICD-10-CM | POA: Insufficient documentation

## 2018-06-03 MED ORDER — CYCLOBENZAPRINE HCL 5 MG PO TABS: 5.0000 mg | ORAL_TABLET | Freq: Three times a day (TID) | ORAL | 0 refills | Status: DC | PRN

## 2018-06-03 MED ORDER — HYDROCHLOROTHIAZIDE 25 MG PO TABS: 25.0000 mg | ORAL_TABLET | Freq: Every day | ORAL | 0 refills | Status: DC

## 2018-06-03 MED ORDER — GABAPENTIN 300 MG PO CAPS: 600.0000 mg | ORAL_CAPSULE | Freq: Every day | ORAL | 0 refills | Status: DC

## 2018-06-03 MED ORDER — PRAVASTATIN SODIUM 40 MG PO TABS: 40.0000 mg | ORAL_TABLET | Freq: Every day | ORAL | 0 refills | Status: DC

## 2018-06-03 NOTE — Progress Notes (Signed)
BP (!) 157/73 Comment: pt reported   Subjective:    Patient ID: Marcia [name], female    DOB: [birth year], 68 y.o.   MRN: [id number]  HPI: Marcia [name] is a 68 y.o. female  Chief Complaint  Patient presents with   Anxiety   Hyperlipidemia   Hypertension   Back Pain    lower back pain, started Friday     This visit was completed via telephone due to the restrictions of the COVID-19 pandemic. All issues as above were discussed and addressed but no physical exam was performed. If it was felt that the patient should be evaluated in the office, they were directed there. The patient verbally consented to this visit. Patient was unable to complete an audio/visual visit due to Technical difficulties,Lack of internet. Due to the catastrophic nature of the COVID-19 pandemic, this visit was done through audio contact only.  Location of the patient: home  Location of the provider: home  Those involved with this call:   Provider: Roosvelt Maser, PA-C  CMA: Wilhemena Durie, CMA  Front Desk/Registration: Harriet Pho   Time spent on call: 25 minutes on the phone discussing health concerns. 10 minutes total spent in review of patient's record and preparation of their chart. I verified patient identity using two factors (patient name and date of birth). Patient consents verbally to being seen via telemedicine visit today.   Presenting today for 6 month f/u chronic conditions. Taking her medicines faithfully without side effects or issues.   Main concern is acute on chronic low back pain b/l x 4-5 days, mostly dull and achy and worst with bending over. Tylenol not helping. Denies radiation of pain, numbness, tingling, weakness, bowel bladder incontinence. Can't think of anything in particular that could have started it. Occasionally deals with sciatica issues when her back flares but this time it's fairly localized.   Anxiety stable on celexa and hydroxyzine prn. Denies panic  episodes, mood concerns, sleep or appetite issues.   Home BPs have been running around 140s-150/70s. Taking clonidine daily without issue. Checking occasionally at home. Denies CP, SOB, HAs, dizziness.   Taking belsomra daily for insomnia with excellent benefit. Denies side effects, sleeps 7-9 hours with the medicine with no morning grogginess.   Taking Pravastatin for HLD. No claudication, myalgias, Cp, SOB. Staying very active and eating well.   Depression screen [facility] 2/9 06/03/2018 04/01/2018 12/03/2017  Decreased Interest 1 0 3  Down, Depressed, Hopeless 0 0 3  PHQ - 2 Score 1 0 6  Altered sleeping 0 - 0  Tired, decreased energy 0 - 2  Change in appetite 0 - 2  Feeling bad or failure about yourself  0 - 0  Trouble concentrating 0 - 0  Moving slowly or fidgety/restless 0 - 0  Suicidal thoughts 0 - 0  PHQ-9 Score 1 - 10  Difficult doing work/chores Not difficult at all - -   GAD 7 : Generalized Anxiety Score 06/03/2018 12/03/2017 10/15/2017  Nervous, Anxious, on Edge 0 0 0  Control/stop worrying 0 0 0  Worry too much - different things 0 0 0  Trouble relaxing 0 0 0  Restless 0 0 0  Easily annoyed or irritable 0 0 0  Afraid - awful might happen 0 0 0  Total GAD 7 Score 0 0 0  Anxiety Difficulty Not difficult at all - -     Relevant past medical, surgical, family and social history reviewed and updated as indicated. Interim medical  history since our last visit reviewed. Allergies and medications reviewed and updated.  Review of Systems  Per HPI unless specifically indicated above     Objective:    BP (!) 157/73 Comment: pt reported  Wt Readings from Last 3 Encounters:  04/01/18 185 lb 4.8 oz (84.1 kg)  03/25/18 170 lb (77.1 kg)  02/18/18 175 lb 4.6 oz (79.5 kg)    Physical Exam  Unable to perform PE due to lack of video technology available to patient  Results for orders placed or performed during the hospital encounter of 12/06/17  Lipase, blood  Result Value Ref  Range   Lipase 19 11 - 51 U/L  Comprehensive metabolic panel  Result Value Ref Range   Sodium 139 135 - 145 mmol/L   Potassium 3.3 (L) 3.5 - 5.1 mmol/L   Chloride 98 98 - 111 mmol/L   CO2 27 22 - 32 mmol/L   Glucose, Bld 185 (H) 70 - 99 mg/dL   BUN 27 (H) 8 - 23 mg/dL   Creatinine, Ser 4.090.77 0.44 - 1.00 mg/dL   Calcium 8.9 8.9 - 81.110.3 mg/dL   Total Protein 6.8 6.5 - 8.1 g/dL   Albumin 3.8 3.5 - 5.0 g/dL   AST 32 15 - 41 U/L   ALT 42 0 - 44 U/L   Alkaline Phosphatase 45 38 - 126 U/L   Total Bilirubin 1.4 (H) 0.3 - 1.2 mg/dL   GFR calc non Af Amer >60 >60 mL/min   GFR calc Af Amer >60 >60 mL/min   Anion gap 14 5 - 15  CBC  Result Value Ref Range   WBC 16.7 (H) 4.0 - 10.5 K/uL   RBC 5.16 (H) 3.87 - 5.11 MIL/uL   Hemoglobin 15.5 (H) 12.0 - 15.0 g/dL   HCT 91.445.0 78.236.0 - 95.646.0 %   MCV 87.2 80.0 - 100.0 fL   MCH 30.0 26.0 - 34.0 pg   MCHC 34.4 30.0 - 36.0 g/dL   RDW 21.314.6 08.611.5 - 57.815.5 %   Platelets 311 150 - 400 K/uL   nRBC 0.0 0.0 - 0.2 %  Ammonia  Result Value Ref Range   Ammonia 18 9 - 35 umol/L  CBC  Result Value Ref Range   WBC 12.3 (H) 4.0 - 10.5 K/uL   RBC 4.46 3.87 - 5.11 MIL/uL   Hemoglobin 13.3 12.0 - 15.0 g/dL   HCT 46.939.8 62.936.0 - 52.846.0 %   MCV 89.2 80.0 - 100.0 fL   MCH 29.8 26.0 - 34.0 pg   MCHC 33.4 30.0 - 36.0 g/dL   RDW 41.315.1 24.411.5 - 01.015.5 %   Platelets 265 150 - 400 K/uL   nRBC 0.0 0.0 - 0.2 %  Basic metabolic panel  Result Value Ref Range   Sodium 139 135 - 145 mmol/L   Potassium 3.3 (L) 3.5 - 5.1 mmol/L   Chloride 102 98 - 111 mmol/L   CO2 30 22 - 32 mmol/L   Glucose, Bld 193 (H) 70 - 99 mg/dL   BUN 23 8 - 23 mg/dL   Creatinine, Ser 2.720.83 0.44 - 1.00 mg/dL   Calcium 8.1 (L) 8.9 - 10.3 mg/dL   GFR calc non Af Amer >60 >60 mL/min   GFR calc Af Amer >60 >60 mL/min   Anion gap 7 5 - 15  CBC  Result Value Ref Range   WBC 9.5 4.0 - 10.5 K/uL   RBC 3.97 3.87 - 5.11 MIL/uL   Hemoglobin 12.0 12.0 - 15.0  g/dL   HCT 56.3 87.5 - 64.3 %   MCV 91.2 80.0 - 100.0 fL    MCH 30.2 26.0 - 34.0 pg   MCHC 33.1 30.0 - 36.0 g/dL   RDW 32.9 51.8 - 84.1 %   Platelets 241 150 - 400 K/uL   nRBC 0.0 0.0 - 0.2 %  Basic metabolic panel  Result Value Ref Range   Sodium 141 135 - 145 mmol/L   Potassium 3.1 (L) 3.5 - 5.1 mmol/L   Chloride 105 98 - 111 mmol/L   CO2 32 22 - 32 mmol/L   Glucose, Bld 145 (H) 70 - 99 mg/dL   BUN 17 8 - 23 mg/dL   Creatinine, Ser 6.60 0.44 - 1.00 mg/dL   Calcium 7.9 (L) 8.9 - 10.3 mg/dL   GFR calc non Af Amer >60 >60 mL/min   GFR calc Af Amer >60 >60 mL/min   Anion gap 4 (L) 5 - 15  CBC  Result Value Ref Range   WBC 9.3 4.0 - 10.5 K/uL   RBC 4.29 3.87 - 5.11 MIL/uL   Hemoglobin 12.8 12.0 - 15.0 g/dL   HCT 63.0 16.0 - 10.9 %   MCV 91.1 80.0 - 100.0 fL   MCH 29.8 26.0 - 34.0 pg   MCHC 32.7 30.0 - 36.0 g/dL   RDW 32.3 55.7 - 32.2 %   Platelets 304 150 - 400 K/uL   nRBC 0.0 0.0 - 0.2 %  Basic metabolic panel  Result Value Ref Range   Sodium 142 135 - 145 mmol/L   Potassium 3.2 (L) 3.5 - 5.1 mmol/L   Chloride 105 98 - 111 mmol/L   CO2 33 (H) 22 - 32 mmol/L   Glucose, Bld 123 (H) 70 - 99 mg/dL   BUN 11 8 - 23 mg/dL   Creatinine, Ser 0.25 0.44 - 1.00 mg/dL   Calcium 8.1 (L) 8.9 - 10.3 mg/dL   GFR calc non Af Amer >60 >60 mL/min   GFR calc Af Amer >60 >60 mL/min   Anion gap 4 (L) 5 - 15  Basic metabolic panel  Result Value Ref Range   Sodium 143 135 - 145 mmol/L   Potassium 3.5 3.5 - 5.1 mmol/L   Chloride 107 98 - 111 mmol/L   CO2 32 22 - 32 mmol/L   Glucose, Bld 118 (H) 70 - 99 mg/dL   BUN 9 8 - 23 mg/dL   Creatinine, Ser 4.27 0.44 - 1.00 mg/dL   Calcium 8.2 (L) 8.9 - 10.3 mg/dL   GFR calc non Af Amer >60 >60 mL/min   GFR calc Af Amer >60 >60 mL/min   Anion gap 4 (L) 5 - 15  CBC with Differential/Platelet  Result Value Ref Range   WBC 8.1 4.0 - 10.5 K/uL   RBC 4.22 3.87 - 5.11 MIL/uL   Hemoglobin 12.6 12.0 - 15.0 g/dL   HCT 06.2 37.6 - 28.3 %   MCV 91.7 80.0 - 100.0 fL   MCH 29.9 26.0 - 34.0 pg   MCHC 32.6 30.0 -  36.0 g/dL   RDW 15.1 76.1 - 60.7 %   Platelets 325 150 - 400 K/uL   nRBC 0.0 0.0 - 0.2 %   Neutrophils Relative % 64 %   Neutro Abs 5.2 1.7 - 7.7 K/uL   Lymphocytes Relative 20 %   Lymphs Abs 1.6 0.7 - 4.0 K/uL   Monocytes Relative 8 %   Monocytes Absolute 0.7 0.1 - 1.0 K/uL  Eosinophils Relative 6 %   Eosinophils Absolute 0.5 0.0 - 0.5 K/uL   Basophils Relative 1 %   Basophils Absolute 0.1 0.0 - 0.1 K/uL   Immature Granulocytes 1 %   Abs Immature Granulocytes 0.11 (H) 0.00 - 0.07 K/uL  Surgical pathology  Result Value Ref Range   SURGICAL PATHOLOGY      Surgical Pathology CASE: [id number] PATIENT: Everlena Cooper Surgical Pathology Report     SPECIMEN SUBMITTED: A. Colon, sigmoid  CLINICAL HISTORY: None provided  PRE-OPERATIVE DIAGNOSIS: Bowel perforation  POST-OPERATIVE DIAGNOSIS: Same as pre-op     DIAGNOSIS: A. SIGMOID; SECTION: - ISCHEMIC COLITIS WITH PERFORATION AND SUPPURATIVE SEROSITIS. - MUCOSAL ULCERATION. - NEGATIVE FOR DYSPLASIA AND MALIGNANCY. - VIABLE MARGINS OF RESECTION.  GROSS DESCRIPTION: A. Labeled: Sigmoid colon Received: In formalin Specimens received: Sigmoid colon Measurements: 12.1 cm long up to 2.5 cm in diameter Specimen Integrity: 1.9 x 1.5 cm defect emitting fecal material, 3.5 cm from closest stapled mucosal margin Orientation: Surrounding the defect is marked orange External surface: Purple to tan with fibrinopurulent material in the central and defect area Description: Opening the specimen, the area of defect is dilated and containing fecal material, no mucosal le sions grossly identified, there is some black suture material in this area of defect with no designation and the wall is thinned and mucosa is flattened, remaining parenchyma is folded pink-tan Lymph nodes: No prominent identified  Block Summary: 1-2 - en face stapled margins (cassette 1 closest to defect) 3 - representative defect 4 - representative  transition between adjacent parenchyma and thinned wall with flattened mucosa defect 5 - representative deep folds of remaining parenchyma  Final Diagnosis performed by Elijah Birk, MD.   Electronically signed 12/09/2017 3:10:43PM The electronic signature indicates that the named Attending Pathologist has evaluated the specimen  Technical component performed at [facility], [address] [place], [place] [address] Lab: [phone] Dir: Jolene Schimke, MD, MMM  Professional component performed at [facility], [facility], [address] [place], [place], [place] [address] Lab: [phone] Dir: Georgiann Cocker. Rubinas, MD        Assessment & Plan:   Problem List Items Addressed This Visit      Cardiovascular and Mediastinum   Hypertension - Primary    Mildly above goal, but patient wanting to hold off on adding medication at this time. Will continue home monitoring and call with worsening readings. DASH diet, exercise. Recheck at upcoming f/u.       Relevant Medications   pravastatin (PRAVACHOL) 40 MG tablet   hydrochlorothiazide (HYDRODIURIL) 25 MG tablet   Other Relevant Orders   Comprehensive metabolic panel     Other   Hyperlipidemia    Recheck lipids, adjust as needed. Continue current regimen and good diet and exercise      Relevant Medications   pravastatin (PRAVACHOL) 40 MG tablet   hydrochlorothiazide (HYDRODIURIL) 25 MG tablet   Other Relevant Orders   Lipid Panel w/o Chol/HDL Ratio   Anxiety    Stable and WNL, continue current regimen      Insomnia    Stable and under good control, continue current regimen      Chronic low back pain    Lidocaine patches, massage, stretches, avoid strenuous activity. OTC pain relievers prn. F/u if worsening or not improving.       Relevant Medications   cyclobenzaprine (FLEXERIL) 5 MG tablet       Follow up plan: Return in about 6 months (around 12/04/2018) for CPE.

## 2018-06-07 NOTE — Assessment & Plan Note (Signed)
Lidocaine patches, massage, stretches, avoid strenuous activity. OTC pain relievers prn. F/u if worsening or not improving.

## 2018-06-07 NOTE — Assessment & Plan Note (Signed)
Mildly above goal, but patient wanting to hold off on adding medication at this time. Will continue home monitoring and call with worsening readings. DASH diet, exercise. Recheck at upcoming f/u.

## 2018-06-07 NOTE — Assessment & Plan Note (Signed)
Recheck lipids, adjust as needed. Continue current regimen and good diet and exercise 

## 2018-06-07 NOTE — Assessment & Plan Note (Signed)
Stable and under good control, continue current regimen 

## 2018-06-07 NOTE — Assessment & Plan Note (Signed)
Stable and WNL, continue current regimen 

## 2018-06-10 ENCOUNTER — Other Ambulatory Visit: Payer: Self-pay

## 2018-06-10 ENCOUNTER — Other Ambulatory Visit: Payer: Medicare Other

## 2018-06-10 DIAGNOSIS — E782 Mixed hyperlipidemia: Secondary | ICD-10-CM | POA: Diagnosis not present

## 2018-06-10 DIAGNOSIS — I10 Essential (primary) hypertension: Secondary | ICD-10-CM

## 2018-06-11 ENCOUNTER — Encounter: Payer: Self-pay | Admitting: Family Medicine

## 2018-06-11 LAB — LIPID PANEL W/O CHOL/HDL RATIO
Cholesterol, Total: 165 mg/dL (ref 100–199)
HDL: 39 mg/dL — ABNORMAL LOW (ref >39)
LDL Calculated: 90 mg/dL (ref 0–99)
Triglycerides: 182 mg/dL — ABNORMAL HIGH (ref 0–149)
VLDL Cholesterol Cal: 36 mg/dL (ref 5–40)

## 2018-06-11 LAB — COMPREHENSIVE METABOLIC PANEL
ALT: 43 IU/L — ABNORMAL HIGH (ref 0–32)
AST: 34 IU/L (ref 0–40)
Albumin/Globulin Ratio: 1.7 (ref 1.2–2.2)
Albumin: 4.4 g/dL (ref 3.8–4.8)
Alkaline Phosphatase: 66 IU/L (ref 39–117)
BUN/Creatinine Ratio: 18 (ref 12–28)
BUN: 14 mg/dL (ref 8–27)
Bilirubin Total: 0.6 mg/dL (ref 0.0–1.2)
CO2: 27 mmol/L (ref 20–29)
Calcium: 9.2 mg/dL (ref 8.7–10.3)
Chloride: 100 mmol/L (ref 96–106)
Creatinine, Ser: 0.8 mg/dL (ref 0.57–1.00)
GFR calc Af Amer: 88 mL/min/{1.73_m2} (ref >59)
GFR calc non Af Amer: 77 mL/min/{1.73_m2} (ref >59)
Globulin, Total: 2.6 g/dL (ref 1.5–4.5)
Glucose: 110 mg/dL — ABNORMAL HIGH (ref 65–99)
Potassium: 4.1 mmol/L (ref 3.5–5.2)
Sodium: 141 mmol/L (ref 134–144)
Total Protein: 7 g/dL (ref 6.0–8.5)

## 2018-06-12 ENCOUNTER — Telehealth: Payer: Self-pay | Admitting: Family Medicine

## 2018-06-12 NOTE — Telephone Encounter (Signed)
Patient notified of lab results

## 2018-06-12 NOTE — Telephone Encounter (Signed)
Result letter printed yesterday. OK to let her know labs came back stable  Copied from CRM [id number]. Topic: General - Inquiry >> Jun 12, 2018  9:38 AM Wyonia Hough E wrote: Reason for CRM: Pt called to inquire about her lab results/ please advise

## 2018-06-22 ENCOUNTER — Telehealth: Payer: Self-pay

## 2018-06-22 ENCOUNTER — Other Ambulatory Visit: Payer: Self-pay

## 2018-06-22 ENCOUNTER — Telehealth: Payer: Self-pay | Admitting: Gastroenterology

## 2018-06-22 DIAGNOSIS — K631 Perforation of intestine (nontraumatic): Secondary | ICD-10-CM

## 2018-06-22 NOTE — Telephone Encounter (Signed)
Pt left vm to r/s her colonoscopy for next wednesday

## 2018-06-22 NOTE — Telephone Encounter (Signed)
LVM for pt to inform her that we will need to reschedule her 07/01/18 Colonoscopy with Dr. Tobi Bastos to another date, and to please contact the office at her earliest convenience so that we can reschedule.  Thanks [employer]

## 2018-06-22 NOTE — Telephone Encounter (Signed)
Patients colonoscopy has been rescheduled to 08/18/18 due to limited availability of scheduling.  This is the date that best suited her availability.  Thanks [employer]

## 2018-06-24 ENCOUNTER — Other Ambulatory Visit: Payer: Medicare Other

## 2018-06-25 ENCOUNTER — Inpatient Hospital Stay: Admission: RE | Admit: 2018-06-25 | Payer: Medicare Other | Source: Ambulatory Visit

## 2018-07-01 ENCOUNTER — Ambulatory Visit: Admission: RE | Admit: 2018-07-01 | Payer: Medicare Other | Source: Home / Self Care | Admitting: Gastroenterology

## 2018-07-01 ENCOUNTER — Encounter: Admission: RE | Payer: Self-pay | Source: Home / Self Care

## 2018-07-01 SURGERY — COLONOSCOPY WITH PROPOFOL
Anesthesia: General

## 2018-07-03 ENCOUNTER — Telehealth: Payer: Self-pay | Admitting: Family Medicine

## 2018-07-03 NOTE — Chronic Care Management (AMB) (Signed)
Chronic Care Management   Note  07/03/2018 Name: Marcia [name] MRN: [id number] DOB: 04/11/1950  Marcia [name] is a 68 y.o. year old female who is a primary care patient of Volney American, [place]. I reached out to Encarnacion Chu by phone today in response to a referral sent by Ms. Lynnda Child Demmer's health plan.    Ms. Blakeman was given information about Chronic Care Management services today including:  1. CCM service includes personalized support from designated clinical staff supervised by her physician, including individualized plan of care and coordination with other care providers 2. 24/7 contact phone numbers for assistance for urgent and routine care needs. 3. Service will only be billed when office clinical staff spend 20 minutes or more in a month to coordinate care. 4. Only one practitioner may furnish and bill the service in a calendar month. 5. The patient may stop CCM services at any time (effective at the end of the month) by phone call to the office staff. 6. The patient will be responsible for cost sharing (co-pay) of up to 20% of the service fee (after annual deductible is met).  Patient agreed to services and verbal consent obtained.   Follow up plan: Telephone appointment with CCM team member scheduled for: 07/21/2018  [place]  [email or web address]   ??[phone]

## 2018-07-03 NOTE — Chronic Care Management (AMB) (Signed)
°  Chronic Care Management   Outreach Note  07/03/2018 Name: Marcia [name] MRN: [id number] DOB: May 06, 1950  Referred by: Volney American, PA-C Reason for referral : No chief complaint on file.   An unsuccessful telephone outreach was attempted today. The patient was referred to the case management team by for assistance with chronic care management and care coordination.   Follow Up Plan: A HIPPA compliant phone message was left for the patient providing contact information and requesting a return call.  The care management team will reach out to the patient again over the next 7 days.  If patient returns call to provider office, please advise to call [facility]* at [place]  [email or web address]   ??[phone]

## 2018-07-07 ENCOUNTER — Other Ambulatory Visit: Payer: Self-pay

## 2018-07-07 ENCOUNTER — Encounter: Payer: Self-pay | Admitting: Family Medicine

## 2018-07-07 ENCOUNTER — Ambulatory Visit (INDEPENDENT_AMBULATORY_CARE_PROVIDER_SITE_OTHER): Payer: Medicare Other | Admitting: Family Medicine

## 2018-07-07 DIAGNOSIS — R21 Rash and other nonspecific skin eruption: Secondary | ICD-10-CM | POA: Diagnosis not present

## 2018-07-07 NOTE — Progress Notes (Signed)
There were no vitals taken for this visit.   Subjective:    Patient ID: Marcia [name], female    DOB: 24-Nov-1950, 68 y.o.   MRN: [id number]  HPI: Marcia [name] is a 68 y.o. female  Chief Complaint  Patient presents with  . Rash    right shoulder- more like a sore, does not itch right leg, they come up about a week ago, has not bothered her until recently    . This visit was completed via telephone due to the restrictions of the COVID-19 pandemic. All issues as above were discussed and addressed. Physical exam was done as above through visual confirmation on telephone. If it was felt that the patient should be evaluated in the office, they were directed there. The patient verbally consented to this visit. . Location of the patient: home . Location of the provider: home . Those involved with this call:  . Provider: Merrie Roof, PA-C . CMA: Tiffany Reel, CMA . Front Desk/Registration: Jill Side  . Time spent on call: 15 minutes on the phone discussing health concerns. 5 minutes total spent in review of patient's record and preparation of their chart. I verified patient identity using two factors (patient name and date of birth). Patient consents verbally to being seen via telemedicine visit today.   Presenting today with two different rashes, one on right shoulder and one on right lower leg. States the rash on shoulder is dull and achy and almost seems like a bite of some sort, and the one on her leg doesn't bother her at all. No new exposures she's aware of, including hygiene products, medications, outdoor exposures, foods. Has been using hydrocortisone cream on the areas with some relief. Denies fevers, chills, drainage, bleeding, itching, peeling.   Relevant past medical, surgical, family and social history reviewed and updated as indicated. Interim medical history since our last visit reviewed. Allergies and medications reviewed and updated.  Review of Systems  Per HPI  unless specifically indicated above     Objective:    There were no vitals taken for this visit.  Wt Readings from Last 3 Encounters:  04/01/18 185 lb 4.8 oz (84.1 kg)  03/25/18 170 lb (77.1 kg)  02/18/18 175 lb 4.6 oz (79.5 kg)    Physical Exam  Unable to perform PE due to lack of video technology on the patient's side  Results for orders placed or performed in visit on 06/10/18  Lipid Panel w/o Chol/HDL Ratio  Result Value Ref Range   Cholesterol, Total 165 100 - 199 mg/dL   Triglycerides 182 (H) 0 - 149 mg/dL   HDL 39 (L) >39 mg/dL   VLDL Cholesterol Cal 36 5 - 40 mg/dL   LDL Calculated 90 0 - 99 mg/dL  Comprehensive metabolic panel  Result Value Ref Range   Glucose 110 (H) 65 - 99 mg/dL   BUN 14 8 - 27 mg/dL   Creatinine, Ser 0.80 0.57 - 1.00 mg/dL   GFR calc non Af Amer 77 >59 mL/min/1.73   GFR calc Af Amer 88 >59 mL/min/1.73   BUN/Creatinine Ratio 18 12 - 28   Sodium 141 134 - 144 mmol/L   Potassium 4.1 3.5 - 5.2 mmol/L   Chloride 100 96 - 106 mmol/L   CO2 27 20 - 29 mmol/L   Calcium 9.2 8.7 - 10.3 mg/dL   Total Protein 7.0 6.0 - 8.5 g/dL   Albumin 4.4 3.8 - 4.8 g/dL   Globulin, Total 2.6 1.5 -  4.5 g/dL   Albumin/Globulin Ratio 1.7 1.2 - 2.2   Bilirubin Total 0.6 0.0 - 1.2 mg/dL   Alkaline Phosphatase 66 39 - 117 IU/L   AST 34 0 - 40 IU/L   ALT 43 (H) 0 - 32 IU/L      Assessment & Plan:   Problem List Items Addressed This Visit    None    Visit Diagnoses    Rash    -  Primary   Discussed difficulty in determining the issue without visual exam. Can try compresses and triamcinolone but needs in person visit if not improving or worsening       Follow up plan: Return if symptoms worsen or fail to improve.

## 2018-07-08 ENCOUNTER — Other Ambulatory Visit: Payer: Self-pay | Admitting: Family Medicine

## 2018-07-16 ENCOUNTER — Telehealth: Payer: Self-pay | Admitting: Gastroenterology

## 2018-07-16 NOTE — Telephone Encounter (Signed)
Patent called & l/m stating she needs to talk with someone about her procedure 08-18-2018.

## 2018-07-16 NOTE — Telephone Encounter (Signed)
Patients colonoscopy has been rescheduled to 08/25/18 with Dr. Marius Ditch, due to patient needing to care for elderly friend and make arrangement.  New COVID test date is July 31st.  Thanks Sharyn Lull

## 2018-07-21 ENCOUNTER — Ambulatory Visit
Admission: RE | Admit: 2018-07-21 | Discharge: 2018-07-21 | Disposition: A | Discharge status: Home / Self Care | Payer: Medicare Other | Source: Ambulatory Visit | Attending: Family Medicine | Admitting: Family Medicine

## 2018-07-21 ENCOUNTER — Telehealth: Payer: Self-pay

## 2018-07-21 ENCOUNTER — Telehealth: Payer: Medicare Other

## 2018-07-21 ENCOUNTER — Other Ambulatory Visit: Payer: Self-pay

## 2018-07-21 DIAGNOSIS — Z780 Asymptomatic menopausal state: Secondary | ICD-10-CM | POA: Diagnosis not present

## 2018-07-21 DIAGNOSIS — Z1231 Encounter for screening mammogram for malignant neoplasm of breast: Secondary | ICD-10-CM | POA: Diagnosis not present

## 2018-07-21 DIAGNOSIS — M85852 Other specified disorders of bone density and structure, left thigh: Secondary | ICD-10-CM | POA: Insufficient documentation

## 2018-07-21 DIAGNOSIS — Z13820 Encounter for screening for osteoporosis: Secondary | ICD-10-CM | POA: Diagnosis not present

## 2018-07-21 DIAGNOSIS — Z1239 Encounter for other screening for malignant neoplasm of breast: Secondary | ICD-10-CM

## 2018-07-23 ENCOUNTER — Encounter: Payer: Self-pay | Admitting: Family Medicine

## 2018-07-23 ENCOUNTER — Other Ambulatory Visit: Payer: Self-pay | Admitting: Family Medicine

## 2018-07-23 DIAGNOSIS — M8580 Other specified disorders of bone density and structure, unspecified site: Secondary | ICD-10-CM | POA: Insufficient documentation

## 2018-07-27 ENCOUNTER — Other Ambulatory Visit: Payer: Self-pay | Admitting: Family Medicine

## 2018-07-27 NOTE — Telephone Encounter (Signed)
Requested Prescriptions  Pending Prescriptions Disp Refills  . hydrOXYzine (VISTARIL) 25 MG capsule [Pharmacy Med Name: HYDROXYZINE PAM 25 MG CAP] 90 capsule 0    Sig: Take 1 capsule (25 mg total) by mouth 3 (three) times daily as needed.     Ear, Nose, and Throat:  Antihistamines Passed - 07/27/2018 10:17 AM      Passed - Valid encounter within last 12 months    Recent Outpatient Visits          2 weeks ago Rash   [facility] Volney American, [place]   1 month ago Essential hypertension   [place], [place], [place]   7 months ago Right sided sciatica   [facility] Volney American, [place]   9 months ago Essential hypertension   [facility] Merrie Roof [place], [place]   1 year ago Urinary frequency   [place], Lilia Argue, [place]      Future Appointments            In 4 months Orene Desanctis, Lilia Argue, [place], [place]

## 2018-08-07 ENCOUNTER — Ambulatory Visit (INDEPENDENT_AMBULATORY_CARE_PROVIDER_SITE_OTHER): Payer: Medicare Other | Admitting: *Deleted

## 2018-08-07 DIAGNOSIS — I10 Essential (primary) hypertension: Secondary | ICD-10-CM | POA: Diagnosis not present

## 2018-08-07 DIAGNOSIS — M8580 Other specified disorders of bone density and structure, unspecified site: Secondary | ICD-10-CM

## 2018-08-07 NOTE — Patient Instructions (Addendum)
Thank you allowing the Chronic Care Management Team to be a part of your care! It was a pleasure speaking with you today!   CCM (Chronic Care Management) Team   Winfield Caba RN, BSN Nurse Care Coordinator  [phone]  [facility] PharmD  Clinical Pharmacist  [phone]  Dickie LaBrooke Joyce LCSW Clinical Social Worker [phone]  Goals Addressed            This Visit's Progress   . I want to keep my bones healthy (pt-stated)       Current Barriers:  . Chronic Disease Management support and education needs related to osteopenia  Nurse Case Manager Clinical Goal(s):  [name]. Over the next 90 days, patient will demonstrate improved health management independence as evidenced byselfreported increased exercise, addition of high calcium foods and a vitamin D supplement  Interventions:  . Advised patient to Exercise at least 3 times a week including lite weight training in her routine . Discussed plans with patient for ongoing care management follow up and provided patient with direct contact information for care management team . Provided patient with mailed  educational materials related to high calcium foods and ways to prevent osteopenia worsening . Reviewed scheduled/upcoming provider appointments including:  Upcoming colonoscopy 8/4   Patient Self Care Activities:  . Attends church or other social activities . Performs ADL's independently . Performs IADL's independently  Initial goal documentation        The patient verbalized understanding of instructions provided today and declined a print copy of patient instruction materials.   The patient has been provided with contact information for the care management team and has been advised to call with any health related questions or concerns.    Osteopenia  Osteopenia is a loss of thickness (density) inside of the bones. Another name for osteopenia is low bone mass. Mild osteopenia is a normal part of aging. It is not a  disease, and it does not cause symptoms. However, if you have osteopenia and continue to lose bone mass, you could develop a condition that causes the bones to become thin and break more easily (osteoporosis). You may also lose some height, have back pain, and have a stooped posture. Although osteopenia is not a disease, making changes to your lifestyle and diet can help to prevent osteopenia from developing into osteoporosis. What are the causes? Osteopenia is caused by loss of calcium in the bones.  Bones are constantly changing. Old bone cells are continually being replaced with new bone cells. This process builds new bone. The mineral calcium is needed to build new bone and maintain bone density. Bone density is usually highest around age 67. After that, most people's bodies cannot replace all the bone they have lost with new bone. What increases the risk? You are more likely to develop this condition if:  You are older than age 67.  You are a woman who went through menopause early.  You have a long illness that keeps you in bed.  You do not get enough exercise.  You lack certain nutrients (malnutrition).  You have an overactive thyroid gland (hyperthyroidism).  You smoke.  You drink a lot of alcohol.  You are taking medicines that weaken the bones, such as steroids. What are the signs or symptoms? This condition does not cause any symptoms. You may have a slightly higher risk for bone breaks (fractures), so getting fractures more easily than normal may be an indication of osteopenia. How is this diagnosed? Your health  care provider can diagnose this condition with a special type of X-ray exam that measures bone density (dual-energy X-ray absorptiometry, DEXA). This test can measure bone density in your hips, spine, and wrists. Osteopenia has no symptoms, so this condition is usually diagnosed after a routine bone density screening test is done for osteoporosis. This routine screening  is usually done for:  Women who are age 65 or older.  Men who are age 62 or older. If you have risk factors for osteopenia, you may have the screening test at an earlier age. How is this treated? Making dietary and lifestyle changes can lower your risk for osteoporosis. If you have severe osteopenia that is close to becoming osteoporosis, your health care provider may prescribe medicines and dietary supplements such as calcium and vitamin D. These supplements help to rebuild bone density. Follow these instructions at home:   Take over-the-counter and prescription medicines only as told by your health care provider. These include vitamins and supplements.  Eat a diet that is high in calcium and vitamin D. ? Calcium is found in dairy products, beans, salmon, and leafy green vegetables like spinach and broccoli. ? Look for foods that have vitamin D and calcium added to them (fortified foods), such as orange juice, cereal, and bread.  Do 30 or more minutes of a weight-bearing exercise every day, such as walking, jogging, or playing a sport. These types of exercises strengthen the bones.  Take precautions at home to lower your risk of falling, such as: ? Keeping rooms well-lit and free of clutter, such as cords. ? Installing safety rails on stairs. ? Using rubber mats in the bathroom or other areas that are often wet or slippery.  Do not use any products that contain nicotine or tobacco, such as cigarettes and e-cigarettes. If you need help quitting, ask your health care provider.  Avoid alcohol or limit alcohol intake to no more than 1 drink a day for nonpregnant women and 2 drinks a day for men. One drink equals 12 oz of beer, 5 oz of wine, or 1 oz of hard liquor.  Keep all follow-up visits as told by your health care provider. This is important. Contact a health care provider if:  You have not had a bone density screening for osteoporosis and you are: ? A woman, age 43 or older. ? A  man, age 39 or older.  You are a postmenopausal woman who has not had a bone density screening for osteoporosis.  You are older than age 107 and you want to know if you should have bone density screening for osteoporosis. Summary  Osteopenia is a loss of thickness (density) inside of the bones. Another name for osteopenia is low bone mass.  Osteopenia is not a disease, but it may increase your risk for a condition that causes the bones to become thin and break more easily (osteoporosis).  You may be at risk for osteopenia if you are older than age 71 or if you are a woman who went through early menopause.  Osteopenia does not cause any symptoms, but it can be diagnosed with a bone density screening test.  Dietary and lifestyle changes are the first treatment for osteopenia. These may lower your risk for osteoporosis. This information is not intended to replace advice given to you by your health care provider. Make sure you discuss any questions you have with your health care provider. Document Released: 10/16/2016 Document Revised: 12/20/2016 Document Reviewed: 10/16/2016 Elsevier Patient Education  2020 Elsevier Inc.  

## 2018-08-07 NOTE — Chronic Care Management (AMB) (Signed)
  Chronic Care Management   Initial Visit Note  08/07/2018 Name: DORETTE HARTEL MRN: [id number] DOB: 07-31-1950  Referred by: Volney American, PA-C Reason for referral : Chronic Care Management (Initial CCM outreach )   DESTENY FREEMAN is a 68 y.o. year old female who is a primary care patient of Volney American, [place]. The CCM team was consulted for assistance with chronic disease management and care coordination needs.   Review of patient status, including review of consultants reports, relevant laboratory and other test results, and collaboration with appropriate care team members and the patient's provider was performed as part of comprehensive patient evaluation and provision of chronic care management services.    SDOH (Social Determinants of Health) screening performed today. See Care Plan Entry related to challenges with: Physical Activity  Objective:   Goals Addressed            This Visit's Progress   . I want to keep my bones healthy (pt-stated)       Current Barriers:  . Chronic Disease Management support and education needs related to osteopenia  Nurse Case Manager Clinical Goal(s):  [name] Over the next 90 days, patient will demonstrate improved health management independence as evidenced by self-reported increased exercise, addition of high calcium foods and a vitamin D supplement  Interventions:  . Advised patient to Exercise at least 3 times a week including lite weight training in her routine . Discussed plans with patient for ongoing care management follow up and provided patient with direct contact information for care management team . Provided patient with mailed educational materials related to high calcium foods and ways to prevent osteopenia worsening . Reviewed scheduled/upcoming provider appointments including:  Upcoming colonoscopy 8/4   Patient Self Care Activities:  . Attends church or other social activities . Performs ADL's independently  . Performs IADL's independently  Initial goal documentation         Plan:   The care management team will reach out to the patient again over the next 30 days.  The patient has been provided with contact information for the care management team and has been advised to call with any health related questions or concerns.   Merlene Morse Imani Fiebelkorn RN, BSN Nurse Case Editor, [facility]ing [facility]  [phone]) Business Mobile

## 2018-08-11 ENCOUNTER — Other Ambulatory Visit: Payer: Self-pay | Admitting: Family Medicine

## 2018-08-11 NOTE — Telephone Encounter (Signed)
Requested Prescriptions  Pending Prescriptions Disp Refills  . cloNIDine (CATAPRES) 0.1 MG tablet [Pharmacy Med Name: CLONIDINE HCL 0.1 MG TABLET] 90 tablet 0    Sig: Take 1 tablet (0.1 mg total) by mouth daily.     Cardiovascular:  Alpha-2 Agonists Failed - 08/11/2018  9:19 AM      Failed - Last BP in normal range    BP Readings from Last 1 Encounters:  06/03/18 (!) 157/73         Passed - Last Heart Rate in normal range    Pulse Readings from Last 1 Encounters:  04/01/18 85         Passed - Valid encounter within last 6 months    Recent Outpatient Visits          1 month ago Rash   [facility] Volney American, [place]   2 months ago Essential hypertension   [place], [place], [place]   8 months ago Right sided sciatica   [facility] Volney American, [place]   10 months ago Essential hypertension   [facility] Merrie Roof [place], [place]   1 year ago Urinary frequency   [facility] Volney American, [place]      Future Appointments            In 4 months Orene Desanctis, Lilia Argue, [place], [facility]           . gabapentin (NEURONTIN) 300 MG capsule [Pharmacy Med Name: GABAPENTIN 300 MG CAPSULE] 180 capsule 0    Sig: Take 2 capsules (600 mg total) by mouth at bedtime.     Neurology: Anticonvulsants - gabapentin Passed - 08/11/2018  9:19 AM      Passed - Valid encounter within last 12 months    Recent Outpatient Visits          1 month ago Rash   [facility] Volney American, [place]   2 months ago Essential hypertension   [place], [place], [place]   8 months ago Right sided sciatica   [facility] Merrie Roof [place], [place]   10 months ago Essential hypertension   [facility] Merrie Roof [place], [place]   1 year ago Urinary frequency   [place], Lilia Argue, [place]       Future Appointments            In 4 months Orene Desanctis, Lilia Argue, [place], [place]

## 2018-08-14 ENCOUNTER — Inpatient Hospital Stay: Admission: RE | Admit: 2018-08-14 | Payer: Medicare Other | Source: Ambulatory Visit

## 2018-08-17 ENCOUNTER — Telehealth: Payer: Self-pay

## 2018-08-17 NOTE — Telephone Encounter (Signed)
Fax from pharmacy. Gabapentin written for twice daily. Patient stating she's now taking 3 caps daily.  Change from BID to TID?

## 2018-08-19 MED ORDER — GABAPENTIN 300 MG PO CAPS: 300.0000 mg | ORAL_CAPSULE | Freq: Three times a day (TID) | ORAL | 1 refills | Status: DC | PRN

## 2018-08-19 NOTE — Telephone Encounter (Signed)
Rx modified

## 2018-08-21 ENCOUNTER — Other Ambulatory Visit: Payer: Self-pay

## 2018-08-21 ENCOUNTER — Other Ambulatory Visit
Admission: RE | Admit: 2018-08-21 | Discharge: 2018-08-21 | Disposition: A | Discharge status: Home / Self Care | Payer: Medicare Other | Source: Ambulatory Visit | Attending: Gastroenterology | Admitting: Gastroenterology

## 2018-08-21 DIAGNOSIS — Z01812 Encounter for preprocedural laboratory examination: Secondary | ICD-10-CM | POA: Insufficient documentation

## 2018-08-21 DIAGNOSIS — Z20828 Contact with and (suspected) exposure to other viral communicable diseases: Secondary | ICD-10-CM | POA: Diagnosis not present

## 2018-08-21 LAB — SARS CORONAVIRUS 2 (TAT 6-24 HRS): SARS Coronavirus 2: NEGATIVE

## 2018-08-24 ENCOUNTER — Telehealth: Payer: Self-pay | Admitting: Gastroenterology

## 2018-08-24 NOTE — Telephone Encounter (Signed)
Please call pt to go over medication instructions today what she can take before her colonoscopy tomorrow.

## 2018-08-24 NOTE — Telephone Encounter (Signed)
Patient has been advised that she may have chicken broth and banana popscicles today.  Advised her to hold her morning meds the day of procedure.  Thanks [employer]

## 2018-08-25 ENCOUNTER — Other Ambulatory Visit: Payer: Self-pay

## 2018-08-25 ENCOUNTER — Encounter: Payer: Self-pay | Admitting: *Deleted

## 2018-08-25 ENCOUNTER — Ambulatory Visit: Payer: Medicare Other | Admitting: Certified Registered"

## 2018-08-25 ENCOUNTER — Ambulatory Visit
Admission: RE | Admit: 2018-08-25 | Discharge: 2018-08-25 | Disposition: A | Discharge status: Home / Self Care | Payer: Medicare Other | Attending: Gastroenterology | Admitting: Gastroenterology

## 2018-08-25 ENCOUNTER — Encounter
Admission: RE | Disposition: A | Discharge status: Home / Self Care | Payer: Self-pay | Source: Home / Self Care | Attending: Gastroenterology

## 2018-08-25 DIAGNOSIS — K6389 Other specified diseases of intestine: Secondary | ICD-10-CM | POA: Diagnosis not present

## 2018-08-25 DIAGNOSIS — Z980 Intestinal bypass and anastomosis status: Secondary | ICD-10-CM | POA: Diagnosis not present

## 2018-08-25 DIAGNOSIS — F419 Anxiety disorder, unspecified: Secondary | ICD-10-CM | POA: Insufficient documentation

## 2018-08-25 DIAGNOSIS — E785 Hyperlipidemia, unspecified: Secondary | ICD-10-CM | POA: Insufficient documentation

## 2018-08-25 DIAGNOSIS — Z79899 Other long term (current) drug therapy: Secondary | ICD-10-CM | POA: Insufficient documentation

## 2018-08-25 DIAGNOSIS — Z87891 Personal history of nicotine dependence: Secondary | ICD-10-CM | POA: Insufficient documentation

## 2018-08-25 DIAGNOSIS — I10 Essential (primary) hypertension: Secondary | ICD-10-CM | POA: Diagnosis not present

## 2018-08-25 DIAGNOSIS — K631 Perforation of intestine (nontraumatic): Secondary | ICD-10-CM

## 2018-08-25 DIAGNOSIS — Z1211 Encounter for screening for malignant neoplasm of colon: Secondary | ICD-10-CM

## 2018-08-25 DIAGNOSIS — Z9049 Acquired absence of other specified parts of digestive tract: Secondary | ICD-10-CM | POA: Insufficient documentation

## 2018-08-25 HISTORY — PX: COLONOSCOPY WITH PROPOFOL: SHX5780

## 2018-08-25 SURGERY — COLONOSCOPY WITH PROPOFOL
Anesthesia: General

## 2018-08-25 MED ORDER — SODIUM CHLORIDE 0.9 % IV SOLN
INTRAVENOUS | Status: DC
  Administered 2018-08-25: 13:00:00 via INTRAVENOUS

## 2018-08-25 MED ORDER — PROPOFOL 10 MG/ML IV BOLUS
INTRAVENOUS | Status: DC | PRN
  Administered 2018-08-25 (×4): 20 mg via INTRAVENOUS
  Administered 2018-08-25: 50 mg via INTRAVENOUS
  Administered 2018-08-25 (×13): 20 mg via INTRAVENOUS

## 2018-08-25 NOTE — Transfer of Care (Signed)
Immediate Anesthesia Transfer of Care Note  Patient: Marcia [name]  Procedure(s) Performed: COLONOSCOPY WITH PROPOFOL (N/A )  Patient Location: Endoscopy Unit  Anesthesia Type:General  Level of Consciousness: drowsy and responds to stimulation  Airway & Oxygen Therapy: Patient Spontanous Breathing and Patient connected to face mask oxygen  Post-op Assessment: Report given to RN and Post -op Vital signs reviewed and stable  Post vital signs: Reviewed and stable  Last Vitals:  Vitals Value Taken Time  BP 108/66 08/25/18 1350  Temp 36.4 C 08/25/18 1350  Pulse 86 08/25/18 1351  Resp 29 08/25/18 1351  SpO2 99 % 08/25/18 1351  Vitals shown include unvalidated device data.  Last Pain:  Vitals:   08/25/18 1350  TempSrc: Tympanic  PainSc: Asleep         Complications: No apparent anesthesia complications

## 2018-08-25 NOTE — Op Note (Signed)
[facility] Gastroenterology Patient Name: Marcia [name] Procedure Date: 08/25/2018 1:19 PM MRN: [id number] Account #: [id number] Date of Birth: [birth year] Admit Type: Outpatient Age: 68 Room: [facility] ENDO ROOM 1 Gender: Female Note Status: Finalized Procedure:            Colonoscopy Indications:          Screening for colorectal malignant neoplasm, , last                        colonoscopy in 02/2018, poor prep Providers:            Lin Landsman MD, MD Medicines:            Monitored Anesthesia Care Complications:        No immediate complications. Estimated blood loss: None. Procedure:            Pre-Anesthesia Assessment:                       - Prior to the procedure, a History and Physical was                        performed, and patient medications and allergies were                        reviewed. The patient is competent. The risks and                        benefits of the procedure and the sedation options and                        risks were discussed with the patient. All questions                        were answered and informed consent was obtained.                        Patient identification and proposed procedure were                        verified by the physician, the nurse, the                        anesthesiologist, the anesthetist and the technician in                        the pre-procedure area in the procedure room in the                        endoscopy suite. Mental Status Examination: alert and                        oriented. Airway Examination: normal oropharyngeal                        airway and neck mobility. Respiratory Examination:                        clear to auscultation. CV Examination: normal.  Prophylactic Antibiotics: The patient does not require                        prophylactic antibiotics. Prior Anticoagulants: The                        patient has taken no previous anticoagulant or                        antiplatelet agents. ASA Grade Assessment: III - A                        patient with severe systemic disease. After reviewing                        the risks and benefits, the patient was deemed in                        satisfactory condition to undergo the procedure. The                        anesthesia plan was to use monitored anesthesia care                        (MAC). Immediately prior to administration of                        medications, the patient was re-assessed for adequacy                        to receive sedatives. The heart rate, respiratory rate,                        oxygen saturations, blood pressure, adequacy of                        pulmonary ventilation, and response to care were                        monitored throughout the procedure. The physical status                        of the patient was re-assessed after the procedure.                       After obtaining informed consent, the colonoscope was                        passed under direct vision. Throughout the procedure,                        the patient's blood pressure, pulse, and oxygen                        saturations were monitored continuously. The                        Colonoscope was introduced through the anus and                        advanced  to the the cecum, identified by appendiceal                        orifice and ileocecal valve. The colonoscopy was                        performed with moderate difficulty due to significant                        looping and the patient's body habitus. Successful                        completion of the procedure was aided by applying                        abdominal pressure. The patient tolerated the procedure                        well. The quality of the bowel preparation was adequate                        to identify polyps 6 mm and larger in size. Findings:      The perianal and digital rectal examinations were  normal. Pertinent       negatives include normal sphincter tone and no palpable rectal lesions.      There was evidence of a prior end-to-side colo-rectal anastomosis at 20       cm proximal to the anus. This was patent and was characterized by       inflammation. The anastomosis was traversed. Biopsies were taken with a       cold forceps for histology.      The exam was otherwise without abnormality.      Unable to perform retroflexion in rectum Impression:           - Patent end-to-side colo-rectal anastomosis,                        characterized by inflammation. Biopsied.                       - The examination was otherwise normal. Recommendation:       - Discharge patient to home (with escort).                       - Resume previous diet today.                       - Continue present medications.                       - Await pathology results.                       - Repeat colonoscopy in 5-10 years for surveillance                        based on pathology results. Procedure Code(s):    --- Professional ---                       [id number], Colonoscopy, flexible; with biopsy, single or  multiple Diagnosis Code(s):    --- Professional ---                       Z12.11, Encounter for screening for malignant neoplasm                        of colon                       Z98.0, Intestinal bypass and anastomosis status CPT copyright 2019 American Medical Association. All rights reserved. The codes documented in this report are preliminary and upon coder review may  be revised to meet current compliance requirements. Dr. Ulyess Mort Lin Landsman MD, MD 08/25/2018 1:48:55 PM This report has been signed electronically. Number of Addenda: 0 Note Initiated On: 08/25/2018 1:19 PM Scope Withdrawal Time: 0 hours 10 minutes 55 seconds  Total Procedure Duration: 0 hours 17 minutes 44 seconds  Estimated Blood Loss: Estimated blood loss: none.      [facility]

## 2018-08-25 NOTE — H&P (Signed)
Marcia Repressohini R Kingston Shawgo, MD [address]  [address]  [place], [place] [address]  Main: [phone]  Fax: [phone] Pager: [phone]  Primary Care Physician:  Particia NearingLane, Rachel Elizabeth, PA-C Primary Gastroenterologist:  Dr. Arlyss Repressohini R Makennah Omura  Pre-Procedure History & Physical: HPI:  Marcia [name] is a 68 y.o. female is here for an colonoscopy.   Past Medical History:  Diagnosis Date  . Anxiety   . Hyperlipidemia   . Hypertension   . Perforated bowel ([facility]) 12/06/2017    Past Surgical History:  Procedure Laterality Date  . BREAST BIOPSY Left 2015   CORE W/CLIP - NEG  . BREAST BIOPSY Left 07/2007   neg bx   . CESAREAN SECTION    . COLON SURGERY    . COLONOSCOPY WITH PROPOFOL N/A 02/18/2018   Procedure: COLONOSCOPY WITH PROPOFOL;  Surgeon: Wyline MoodAnna, Kiran, MD;  [facility] END[facility];  Service: Gastroenterology;  Laterality: N/A;  . COLONOSCOPY WITH PROPOFOL N/A 03/25/2018   Procedure: COLONOSCOPY WITH PROPOFOL;  Surgeon: Wyline MoodAnna, Kiran, MD;  [facility];  Service: Gastroenterology;  Laterality: N/A;  . LAPAROTOMY N/A 12/06/2017   Procedure: EXPLORATORY LAPAROTOMY, sigmoid colectomy, anastomosis;  Surgeon: Ancil Linseyavis, Jason Evan, MD;  Location: [facility] ORS;  Service: General;  Laterality: N/A;  . TUBAL LIGATION      Prior to Admission medications   Medication Sig Start Date End Date Taking? Authorizing Provider  citalopram (CELEXA) 40 MG tablet Take 1 tablet (40 mg total) by mouth daily. 10/27/17   Particia NearingLane, Rachel Elizabeth, PA-C  cloNIDine (CATAPRES) 0.1 MG tablet Take 1 tablet (0.1 mg total) by mouth daily. 08/11/18   Particia NearingLane, Rachel Elizabeth, PA-C  cyclobenzaprine (FLEXERIL) 5 MG tablet Take 1 tablet (5 mg total) by mouth 3 (three) times daily as needed for muscle spasms. Patient not taking: Reported on 08/07/2018 06/03/18   Particia NearingLane, Rachel Elizabeth, PA-C  docusate sodium (COLACE) 100 MG capsule Take 1 capsule (100 mg total) by mouth 2 (two) times daily. 12/11/17   Donovan KailSchulz, Zachary R, PA-C   gabapentin (NEURONTIN) 300 MG capsule Take 1 capsule (300 mg total) by mouth 3 (three) times daily as needed. 08/19/18   Particia NearingLane, Rachel Elizabeth, PA-C  hydrochlorothiazide (HYDRODIURIL) 25 MG tablet Take 1 tablet (25 mg total) by mouth daily. 06/03/18   Particia NearingLane, Rachel Elizabeth, PA-C  hydrOXYzine (VISTARIL) 25 MG capsule Take 1 capsule (25 mg total) by mouth 3 (three) times daily as needed. 07/27/18   Particia NearingLane, Rachel Elizabeth, PA-C  pravastatin (PRAVACHOL) 40 MG tablet Take 1 tablet (40 mg total) by mouth daily. 06/03/18   Particia NearingLane, Rachel Elizabeth, PA-C  Suvorexant (BELSOMRA) 10 MG TABS Take 10 mg by mouth at bedtime as needed (for sleep). 03/30/18   Particia NearingLane, Rachel Elizabeth, PA-C  triamcinolone cream (KENALOG) 0.1 % Apply 1 application topically 2 (two) times daily. Patient not taking: Reported on 08/07/2018 07/08/18   Particia NearingLane, Rachel Elizabeth, PA-C    Allergies as of 06/22/2018  . (No Known Allergies)    Family History  Problem Relation Age of Onset  . Breast cancer Mother   . Liver cancer Sister   . Pancreatic cancer Sister   . Stomach cancer Brother   . Lung cancer Brother   . Brain cancer Sister   . Diabetes Father   . Heart disease Father   . COPD Neg Hx   . Stroke Neg Hx     Social History   Socioeconomic History  . Marital status: Divorced    Spouse name: Not on file  .  Number of children: Not on file  . Years of education: Not on file  . Highest education level: High school graduate  Occupational History  . Not on file  Social Needs  . Financial resource strain: Not hard at all  . Food insecurity    Worry: Never true    Inability: Never true  . Transportation needs    Medical: No    Non-medical: No  Tobacco Use  . Smoking status: Former Smoker    Types: Cigarettes  . Smokeless tobacco: Never Used  . Tobacco comment: quit over 15 years ago   Substance and Sexual Activity  . Alcohol use: No  . Drug use: No  . Sexual activity: Not on file  Lifestyle  . Physical activity     Days per week: 2 days    Minutes per session: 30 min  . Stress: Not at all  Relationships  . Social connections    Talks on phone: More than three times a week    Gets together: More than three times a week    Attends religious service: More than 4 times per year    Active member of club or organization: No    Attends meetings of clubs or organizations: Never    Relationship status: Divorced  . Intimate partner violence    Fear of current or ex partner: No    Emotionally abused: No    Physically abused: No    Forced sexual activity: No  Other Topics Concern  . 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 Neck:  Supple; no masses or thyromegaly. Lungs:  Clear throughout to auscultation.    Heart:  Regular rate and rhythm. Abdomen:  Soft, nontender and nondistended. Normal bowel sounds, without guarding, and without rebound.   Neurologic:  Alert and  oriented x4;  grossly normal neurologically.  Impression/Plan: Marcia [name] is here for an colonoscopy to be performed for colon cancer screening  Risks, benefits, limitations, and alternatives regarding  colonoscopy have been reviewed with the patient.  Questions have been answered.  All parties agreeable.   Sherri Sear, MD  08/25/2018, 12:57 PM

## 2018-08-25 NOTE — Anesthesia Preprocedure Evaluation (Signed)
Anesthesia Evaluation  Patient identified by MRN, date of birth, ID band Patient awake    Reviewed: Allergy & Precautions, H&P , NPO status , Patient's Chart, lab work & pertinent test results, reviewed documented beta blocker date and time   History of Anesthesia Complications (+) DIFFICULT AIRWAY  Airway Mallampati: II   Neck ROM: full    Dental  (+) Poor Dentition   Pulmonary neg pulmonary ROS, former smoker,    Pulmonary exam normal        Cardiovascular Exercise Tolerance: Good hypertension, On Medications negative cardio ROS Normal cardiovascular exam Rhythm:regular Rate:Normal     Neuro/Psych Anxiety negative neurological ROS  negative psych ROS   GI/Hepatic negative GI ROS, Neg liver ROS,   Endo/Other  negative endocrine ROS  Renal/GU negative Renal ROS  negative genitourinary   Musculoskeletal   Abdominal   Peds  Hematology negative hematology ROS (+)   Anesthesia Other Findings Past Medical History: No date: Anxiety No date: Hyperlipidemia No date: Hypertension 12/06/2017: Perforated bowel [facility]) Past Surgical History: 2015: BREAST BIOPSY; Left     Comment:  CORE W/CLIP - NEG 07/2007: BREAST BIOPSY; Left     Comment:  neg bx  No date: CESAREAN SECTION No date: COLON SURGERY 02/18/2018: COLONOSCOPY WITH PROPOFOL; N/A     Comment:  Procedure: COLONOSCOPY WITH PROPOFOL;  Surgeon: Jonathon Bellows, MD;  Location: [facility] ENDOSCOPY;  Service:               Gastroenterology;  Laterality: N/A; 03/25/2018: COLONOSCOPY WITH PROPOFOL; N/A     Comment:  Procedure: COLONOSCOPY WITH PROPOFOL;  Surgeon: Jonathon Bellows, MD;  Location: [facility] ENDOSCOPY;  Service:               Gastroenterology;  Laterality: N/A; 12/06/2017: LAPAROTOMY; N/A     Comment:  Procedure: EXPLORATORY LAPAROTOMY, sigmoid colectomy,               anastomosis;  Surgeon: Vickie Epley, MD;  Location:               [facility]  ORS;  Service: General;  Laterality: N/A; No date: TUBAL LIGATION BMI    Body Mass Index: 25.06 kg/m     Reproductive/Obstetrics negative OB ROS                             Anesthesia Physical Anesthesia Plan  ASA: III  Anesthesia Plan: General   Post-op Pain Management:    Induction:   PONV Risk Score and Plan:   Airway Management Planned:   Additional Equipment:   Intra-op Plan:   Post-operative Plan:   Informed Consent: I have reviewed the patients History and Physical, chart, labs and discussed the procedure including the risks, benefits and alternatives for the proposed anesthesia with the patient or authorized representative who has indicated his/her understanding and acceptance.     Dental Advisory Given  Plan Discussed with: CRNA  Anesthesia Plan Comments:         Anesthesia Quick Evaluation

## 2018-08-25 NOTE — Anesthesia Post-op Follow-up Note (Signed)
Anesthesia QCDR form completed.        

## 2018-08-26 ENCOUNTER — Encounter: Payer: Self-pay | Admitting: Gastroenterology

## 2018-08-27 ENCOUNTER — Encounter: Payer: Self-pay | Admitting: Gastroenterology

## 2018-08-27 ENCOUNTER — Other Ambulatory Visit: Payer: Self-pay | Admitting: Family Medicine

## 2018-08-27 ENCOUNTER — Telehealth: Payer: Self-pay | Admitting: Family Medicine

## 2018-08-27 LAB — SURGICAL PATHOLOGY

## 2018-08-27 NOTE — Telephone Encounter (Signed)
Please advise 

## 2018-08-27 NOTE — Telephone Encounter (Signed)
Patient calling to check status. Advised patient she likely needs an appointment, patient declined.

## 2018-08-27 NOTE — Telephone Encounter (Signed)
Pt stated she is having pain on the top of her legs again and would like to know if Apolonio Schneiders will send in a rx to her pharmacy. Stated she believes when this happened last year Apolonio Schneiders sent in a steroid. Please advise.

## 2018-08-28 NOTE — Telephone Encounter (Signed)
Pt stated she will call back to schedule an appt. 

## 2018-08-28 NOTE — Telephone Encounter (Signed)
Pt called to check on status of this medication. Please advise.

## 2018-08-28 NOTE — Telephone Encounter (Signed)
Unfortunately she would need an appt for this

## 2018-09-01 NOTE — Anesthesia Postprocedure Evaluation (Signed)
Anesthesia Post Note  Patient: Marcia [name]  Procedure(s) Performed: COLONOSCOPY WITH PROPOFOL (N/A )  Patient location during evaluation: PACU Anesthesia Type: General Level of consciousness: awake and alert Pain management: pain level controlled Vital Signs Assessment: post-procedure vital signs reviewed and stable Respiratory status: spontaneous breathing, nonlabored ventilation, respiratory function stable and patient connected to nasal cannula oxygen Cardiovascular status: blood pressure returned to baseline and stable Postop Assessment: no apparent nausea or vomiting Anesthetic complications: no     Last Vitals:  Vitals:   08/25/18 1410 08/25/18 1420  BP: 122/67 132/63  Pulse: 88 84  Resp: 19 (!) 21  Temp:    SpO2: 98% 95%    Last Pain:  Vitals:   08/26/18 0836  TempSrc:   PainSc: 0-No pain                 Molli Barrows

## 2018-09-08 ENCOUNTER — Telehealth: Payer: Self-pay

## 2018-09-23 ENCOUNTER — Ambulatory Visit: Payer: Medicare Other | Admitting: Surgery

## 2018-09-23 ENCOUNTER — Other Ambulatory Visit: Payer: Self-pay | Admitting: Family Medicine

## 2018-09-23 ENCOUNTER — Telehealth: Payer: Self-pay

## 2018-09-23 NOTE — Telephone Encounter (Signed)
Requested medication (s) are due for refill today: yes  Requested medication (s) are on the active medication list:yes Last refill:  08/26/2018  Future visit scheduled: yes  Notes to clinic:  Review for refill  Requested Prescriptions  Pending Prescriptions Disp Refills   BELSOMRA 10 MG TABS [Pharmacy Med Name: BELSOMRA 10 MG TABLET] 30 tablet 0    Sig: Take 10 mg by mouth at bedtime as needed (for sleep).     Off-Protocol Failed - 09/23/2018 10:39 AM      Failed - Medication not assigned to a protocol, review manually.      Passed - Valid encounter within last 12 months    Recent Outpatient Visits          2 months ago [place], Rachel [place], [place]   3 months ago Essential hypertension   [place], [place], [place]   9 months ago Right sided sciatica   [facility] Merrie Roof [place], [place]   11 months ago Essential hypertension   [facility] Merrie Roof [place], [place]   1 year ago Urinary frequency   [place], Lilia Argue, [place]      Future Appointments            In 1 week [place], Marjory Lies, [place]   In 2 months O[facility] Desan[place], Lilia Argue, [place], [place]

## 2018-09-23 NOTE — Telephone Encounter (Signed)
Routing to provider  

## 2018-09-29 ENCOUNTER — Telehealth: Payer: Self-pay | Admitting: Family Medicine

## 2018-09-29 DIAGNOSIS — R7301 Impaired fasting glucose: Secondary | ICD-10-CM

## 2018-09-29 DIAGNOSIS — E119 Type 2 diabetes mellitus without complications: Secondary | ICD-10-CM | POA: Insufficient documentation

## 2018-09-29 NOTE — Telephone Encounter (Signed)
LVM for patient to return phone call.  

## 2018-09-29 NOTE — Telephone Encounter (Signed)
Monica from [employer] stated that the patients A1C was 6.0

## 2018-09-29 NOTE — Telephone Encounter (Signed)
Noted, will recheck at next f/u. Please let patient know that her recent A1C from [facility] places her in the "prediabetes" category and she should keep doing her best with diet and exercise to help control the blood sugars

## 2018-09-30 ENCOUNTER — Ambulatory Visit: Payer: Medicare Other | Admitting: Surgery

## 2018-09-30 NOTE — Telephone Encounter (Signed)
Patient notified of Rachel's message.  

## 2018-10-01 ENCOUNTER — Other Ambulatory Visit: Payer: Self-pay | Admitting: Family Medicine

## 2018-10-05 ENCOUNTER — Ambulatory Visit: Payer: Medicare Other | Admitting: Surgery

## 2018-10-07 ENCOUNTER — Other Ambulatory Visit: Payer: Self-pay

## 2018-10-07 ENCOUNTER — Other Ambulatory Visit
Admission: RE | Admit: 2018-10-07 | Discharge: 2018-10-07 | Disposition: A | Discharge status: Home / Self Care | Payer: Medicare Other | Source: Ambulatory Visit | Attending: Surgery | Admitting: Surgery

## 2018-10-07 ENCOUNTER — Encounter: Payer: Self-pay | Admitting: Surgery

## 2018-10-07 ENCOUNTER — Ambulatory Visit (INDEPENDENT_AMBULATORY_CARE_PROVIDER_SITE_OTHER): Payer: Medicare Other | Admitting: Surgery

## 2018-10-07 DIAGNOSIS — R1084 Generalized abdominal pain: Secondary | ICD-10-CM | POA: Diagnosis not present

## 2018-10-07 LAB — CBC WITH DIFFERENTIAL/PLATELET
Abs Immature Granulocytes: 0.01 10*3/uL (ref 0.00–0.07)
Basophils Absolute: 0.1 10*3/uL (ref 0.0–0.1)
Basophils Relative: 1 %
Eosinophils Absolute: 0.3 10*3/uL (ref 0.0–0.5)
Eosinophils Relative: 3 %
HCT: 42.9 % (ref 36.0–46.0)
Hemoglobin: 14.5 g/dL (ref 12.0–15.0)
Immature Granulocytes: 0 %
Lymphocytes Relative: 32 %
Lymphs Abs: 2.4 10*3/uL (ref 0.7–4.0)
MCH: 29.5 pg (ref 26.0–34.0)
MCHC: 33.8 g/dL (ref 30.0–36.0)
MCV: 87.4 fL (ref 80.0–100.0)
Monocytes Absolute: 0.7 10*3/uL (ref 0.1–1.0)
Monocytes Relative: 9 %
Neutro Abs: 4.2 10*3/uL (ref 1.7–7.7)
Neutrophils Relative %: 55 %
Platelets: 303 10*3/uL (ref 150–400)
RBC: 4.91 MIL/uL (ref 3.87–5.11)
RDW: 13.4 % (ref 11.5–15.5)
WBC: 7.6 10*3/uL (ref 4.0–10.5)
nRBC: 0 % (ref 0.0–0.2)

## 2018-10-07 LAB — COMPREHENSIVE METABOLIC PANEL
ALT: 33 U/L (ref 0–44)
AST: 28 U/L (ref 15–41)
Albumin: 4.2 g/dL (ref 3.5–5.0)
Alkaline Phosphatase: 56 U/L (ref 38–126)
Anion gap: 11 (ref 5–15)
BUN: 19 mg/dL (ref 8–23)
CO2: 29 mmol/L (ref 22–32)
Calcium: 9.3 mg/dL (ref 8.9–10.3)
Chloride: 100 mmol/L (ref 98–111)
Creatinine, Ser: 0.81 mg/dL (ref 0.44–1.00)
GFR calc Af Amer: >60 mL/min (ref >60)
GFR calc non Af Amer: >60 mL/min (ref >60)
Glucose, Bld: 96 mg/dL (ref 70–99)
Potassium: 3.6 mmol/L (ref 3.5–5.1)
Sodium: 140 mmol/L (ref 135–145)
Total Bilirubin: 0.7 mg/dL (ref 0.3–1.2)
Total Protein: 7 g/dL (ref 6.5–8.1)

## 2018-10-07 NOTE — Patient Instructions (Addendum)
CT scan scheduled 10/12/2018 @ 2 pm at [facility] will need to pick up your CT prep kit after you have your lab work done today.   Please see your appointment listed below.

## 2018-10-09 NOTE — Progress Notes (Signed)
Outpatient Surgical Follow Up  10/09/2018  Marcia [name] is an 68 y.o. female.   Chief Complaint  Patient presents with  . Follow-up    Discuss colonoscopy    HPI: 68 year old female seen after she did have a colectomy with primary ecchymosis by Dr. Rosana Hoes last year she underwent recent colonoscopy by Dr. Marius Ditch that I have personally reviewed the images without any major strictures or masses.  He now comes with some abdominal discomfort.  No fevers no chills. I have also personally reviewed the CT scan when she did have a bowel perforation was found on the sigmoid perforation. She is able to perform more than 4 METS of activity without any shortness of breath or chest pain  Past Medical History:  Diagnosis Date  . Anxiety   . Hyperlipidemia   . Hypertension   . Perforated bowel ([place]) 12/06/2017    Past Surgical History:  Procedure Laterality Date  . BREAST BIOPSY Left 2015   CORE W/CLIP - NEG  . BREAST BIOPSY Left 07/2007   neg bx   . CESAREAN SECTION    . COLON SURGERY    . COLONOSCOPY WITH PROPOFOL N/A 02/18/2018   Procedure: COLONOSCOPY WITH PROPOFOL;  Surgeon: Jonathon Bellows, MD;  Location: [facility] ENDOSCOPY;  Service: Gastroenterology;  Laterality: N/A;  . COLONOSCOPY WITH PROPOFOL N/A 03/25/2018   Procedure: COLONOSCOPY WITH PROPOFOL;  Surgeon: Jonathon Bellows, MD;  Location: [facility] ENDOSCOPY;  Service: Gastroenterology;  Laterality: N/A;  . COLONOSCOPY WITH PROPOFOL N/A 08/25/2018   Procedure: COLONOSCOPY WITH PROPOFOL;  Surgeon: Lin Landsman, MD;  Location: [facility] ENDOSCOPY;  Service: Gastroenterology;  Laterality: N/A;  . LAPAROTOMY N/A 12/06/2017   Procedure: EXPLORATORY LAPAROTOMY, sigmoid colectomy, anastomosis;  Surgeon: Vickie Epley, MD;  Location: [facility] ORS;  Service: General;  Laterality: N/A;  . TUBAL LIGATION      Family History  Problem Relation Age of Onset  . Breast cancer Mother   . Liver cancer Sister   . Pancreatic cancer Sister   . Stomach cancer  Brother   . Lung cancer Brother   . Brain cancer Sister   . Diabetes Father   . Heart disease Father   . COPD Neg Hx   . Stroke Neg Hx     Social History:  reports that she has quit smoking. Her smoking use included cigarettes. She has never used smokeless tobacco. She reports that she does not drink alcohol or use drugs.  Allergies: No Known Allergies  Medications reviewed.    ROS Full ROS performed and is otherwise negative other than what is stated in HPI   BP 130/63   Pulse 79   Temp (!) 95.4 F (35.2 C) (Temporal)   Resp 14   Ht 5\' 8"  (1.727 m)   Wt 182 lb (82.6 kg)   SpO2 95%   BMI 27.67 kg/m   Physical Exam Vitals signs and nursing note reviewed. Exam conducted with a chaperone present.  Constitutional:      General: She is not in acute distress.    Appearance: She is normal weight.  Neck:     Musculoskeletal: Normal range of motion and neck supple. No neck rigidity.  Cardiovascular:     Rate and Rhythm: Normal rate and regular rhythm.     Heart sounds: No murmur.  Pulmonary:     Effort: Pulmonary effort is normal. No respiratory distress.  Abdominal:     General: Abdomen is flat. There is no distension.     Palpations:  There is no mass.     Hernia: A hernia is present.     Comments: Reducible ventral hernia. No peritonitis  Skin:    General: Skin is warm and dry.     Capillary Refill: Capillary refill takes less than 2 seconds.     Coloration: Skin is not jaundiced.  Neurological:     General: No focal deficit present.     Mental Status: She is alert and oriented to person, place, and time.  Psychiatric:        Mood and Affect: Mood normal.        Behavior: Behavior normal.        Thought Content: Thought content normal.        Judgment: Judgment normal.        Assessment/Plan: Symptomatic ventral hernia.  Discussed with the patient in detail by her disease process I will start the work-up with basic laboratory well as a CT scan of the  abdomen and pelvis.  She will likely require some summer prepared some point in time.   Greater than 50% of the 30 minutes  visit was spent in counseling/coordination of care   Sterling Bigiego , MD [facility] General Surgeon

## 2018-10-12 ENCOUNTER — Ambulatory Visit
Admission: RE | Admit: 2018-10-12 | Discharge: 2018-10-12 | Disposition: A | Discharge status: Home / Self Care | Payer: Medicare Other | Source: Ambulatory Visit | Attending: Surgery | Admitting: Surgery

## 2018-10-12 ENCOUNTER — Other Ambulatory Visit: Payer: Self-pay

## 2018-10-12 DIAGNOSIS — R1084 Generalized abdominal pain: Secondary | ICD-10-CM | POA: Diagnosis not present

## 2018-10-12 DIAGNOSIS — K429 Umbilical hernia without obstruction or gangrene: Secondary | ICD-10-CM | POA: Diagnosis not present

## 2018-10-12 MED ORDER — IOHEXOL 300 MG/ML  SOLN
100.0000 mL | Freq: Once | INTRAMUSCULAR | Status: AC | PRN
  Administered 2018-10-12: 14:00:00 100 mL via INTRAVENOUS

## 2018-10-13 ENCOUNTER — Telehealth: Payer: Self-pay

## 2018-10-13 NOTE — Telephone Encounter (Signed)
Per Dr Dahlia Byes, the CT showed a hernia, patient notified, to follow up as scheduled.

## 2018-10-16 ENCOUNTER — Other Ambulatory Visit: Payer: Self-pay | Admitting: Family Medicine

## 2018-10-19 ENCOUNTER — Ambulatory Visit: Payer: Self-pay | Admitting: Surgery

## 2018-10-20 ENCOUNTER — Telehealth: Payer: Self-pay

## 2018-10-21 ENCOUNTER — Ambulatory Visit (INDEPENDENT_AMBULATORY_CARE_PROVIDER_SITE_OTHER): Payer: Medicare Other | Admitting: Surgery

## 2018-10-21 ENCOUNTER — Other Ambulatory Visit: Payer: Self-pay

## 2018-10-21 ENCOUNTER — Encounter: Payer: Self-pay | Admitting: Surgery

## 2018-10-21 VITALS — BP 158/93 | HR 106 | Temp 97.7°F | Resp 14 | Ht 68.0 in | Wt 181.8 lb

## 2018-10-21 DIAGNOSIS — K439 Ventral hernia without obstruction or gangrene: Secondary | ICD-10-CM | POA: Diagnosis not present

## 2018-10-21 NOTE — Patient Instructions (Signed)
Our surgery scheduler will call you within 24-48 hours to schedule your surgery. Please have your Blue sheet available when she calls to discuss your surgery.    Ventral Hernia  A ventral hernia is a bulge of tissue from inside the abdomen that pushes through a weak area of the muscles that form the front wall of the abdomen. The tissues inside the abdomen are inside a sac (peritoneum). These tissues include the small intestine, large intestine, and the fatty tissue that covers the intestines (omentum). Sometimes, the bulge that forms a hernia contains intestines. Other hernias contain only fat. Ventral hernias do not go away without surgical treatment. There are several types of ventral hernias. You may have:  A hernia at an incision site from previous abdominal surgery (incisional hernia).  A hernia just above the belly button (epigastric hernia), or at the belly button (umbilical hernia). These types of hernias can develop from heavy lifting or straining.  A hernia that comes and goes (reducible hernia). It may be visible only when you lift or strain. This type of hernia can be pushed back into the abdomen (reduced).  A hernia that traps abdominal tissue inside the hernia (incarcerated hernia). This type of hernia does not reduce.  A hernia that cuts off blood flow to the tissues inside the hernia (strangulated hernia). The tissues can start to die if this happens. This is a very painful bulge that cannot be reduced. A strangulated hernia is a medical emergency. What are the causes? This condition is caused by abdominal tissue putting pressure on an area of weakness in the abdominal muscles. What increases the risk? The following factors may make you more likely to develop this condition:  Being female.  Being 60 or older.  Being overweight or obese.  Having had previous abdominal surgery, especially if there was an infection after surgery.  Having had an injury to the abdominal wall.   Having had several pregnancies.  Having a buildup of fluid inside the abdomen (ascites). What are the signs or symptoms? The only symptom of a ventral hernia may be a painless bulge in the abdomen. A reducible hernia may be visible only when you strain, cough, or lift. Other symptoms may include:  Dull pain.  A feeling of pressure. Signs and symptoms of a strangulated hernia may include:  Increasing pain.  Nausea and vomiting.  Pain when pressing on the hernia.  The skin over the hernia turning red or purple.  Constipation.  Blood in the stool (feces). How is this diagnosed? This condition may be diagnosed based on:  Your symptoms.  Your medical history.  A physical exam. You may be asked to cough or strain while standing. These actions increase the pressure inside your abdomen and force the hernia through the opening in your muscles. Your health care provider may try to reduce the hernia by pressing on it.  Imaging studies, such as an ultrasound or CT scan. How is this treated? This condition is treated with surgery. If you have a strangulated hernia, surgery is done as soon as possible. If your hernia is small and not incarcerated, you may be asked to lose some weight before surgery. Follow these instructions at home:  Follow instructions from your health care provider about eating or drinking restrictions.  If you are overweight, your health care provider may recommend that you increase your activity level and eat a healthier diet.  Do not lift anything that is heavier than 10 lb (4.5 kg).  Return to your normal activities as told by your health care provider. Ask your health care provider what activities are safe for you. You may need to avoid activities that increase pressure on your hernia.  Take over-the-counter and prescription medicines only as told by your health care provider.  Keep all follow-up visits as told by your health care provider. This is  important. Contact a health care provider if:  Your hernia gets larger.  Your hernia becomes painful. Get help right away if:  Your hernia becomes increasingly painful.  You have pain along with any of the following: ? Changes in skin color in the area of the hernia. ? Nausea. ? Vomiting. ? Fever. Summary  A ventral hernia is a bulge of tissue from inside the abdomen that pushes through a weak area of the muscles that form the front wall of the abdomen.  This condition is treated with surgery, which may be urgent depending on your hernia.  Do not lift anything that is heavier than 10 lb (4.5 kg), and follow activity instructions from your health care provider. This information is not intended to replace advice given to you by your health care provider. Make sure you discuss any questions you have with your health care provider. Document Released: 12/25/2011 Document Revised: 02/19/2017 Document Reviewed: 07/29/2016 Elsevier Patient Education  2020 [employer].

## 2018-10-22 ENCOUNTER — Encounter: Payer: Self-pay | Admitting: Surgery

## 2018-10-22 NOTE — Progress Notes (Signed)
Patient ID: Marcia [name], female   DOB: December 11, 1950, 68 y.o.   MRN: [id number]  HPI Marcia [name] is a 68 y.o. female well-known to me with a prior history of sigmoid colectomy performed last year by Dr. Earlene Plater.  She developed a ventral hernia defect.  I did order a CT scan that I have personally reviewed showing evidence of the hernia defect there is no much loss of domain and I do think that minimally invasive approach is more than appropriate in the setting.  She is able to perform more than 4 METS of activity without any shortness of breath or chest pain.  She has minimal discomfort around the hernia site.  There is no evidence of incarceration or strangulation.  No previous mesh. CBC and CMP nml. she greets with Elbow bump   HPI  Past Medical History:  Diagnosis Date  . Anxiety   . Hyperlipidemia   . Hypertension   . Perforated bowel (HCC) 12/06/2017    Past Surgical History:  Procedure Laterality Date  . BREAST BIOPSY Left 2015   CORE W/CLIP - NEG  . BREAST BIOPSY Left 07/2007   neg bx   . CESAREAN SECTION    . COLON SURGERY    . COLONOSCOPY WITH PROPOFOL N/A 02/18/2018   Procedure: COLONOSCOPY WITH PROPOFOL;  Surgeon: Wyline Mood, MD;  Location: [facility] ENDOSCOPY;  Service: Gastroenterology;  Laterality: N/A;  . COLONOSCOPY WITH PROPOFOL N/A 03/25/2018   Procedure: COLONOSCOPY WITH PROPOFOL;  Surgeon: Wyline Mood, MD;  Location: [facility] ENDOSCOPY;  Service: Gastroenterology;  Laterality: N/A;  . COLONOSCOPY WITH PROPOFOL N/A 08/25/2018   Procedure: COLONOSCOPY WITH PROPOFOL;  Surgeon: Toney Reil, MD;  Location: [facility] ENDOSCOPY;  Service: Gastroenterology;  Laterality: N/A;  . LAPAROTOMY N/A 12/06/2017   Procedure: EXPLORATORY LAPAROTOMY, sigmoid colectomy, anastomosis;  Surgeon: Ancil Linsey, MD;  Location: [facility] ORS;  Service: General;  Laterality: N/A;  . TUBAL LIGATION      Family History  Problem Relation Age of Onset  . Breast cancer Mother   . Liver cancer Sister    . Pancreatic cancer Sister   . Stomach cancer Brother   . Lung cancer Brother   . Brain cancer Sister   . Diabetes Father   . Heart disease Father   . COPD Neg Hx   . Stroke Neg Hx     Social History Social History   Tobacco Use  . Smoking status: Former Smoker    Types: Cigarettes  . Smokeless tobacco: Never Used  . Tobacco comment: quit over 15 years ago   Substance Use Topics  . Alcohol use: No  . Drug use: No    No Known Allergies  Current Outpatient Medications  Medication Sig Dispense Refill  . BELSOMRA 10 MG TABS Take 10 mg by mouth at bedtime as needed (for sleep). 30 tablet 2  . citalopram (CELEXA) 40 MG tablet Take 1 tablet (40 mg total) by mouth daily. 90 tablet 1  . cloNIDine (CATAPRES) 0.1 MG tablet Take 1 tablet (0.1 mg total) by mouth daily. 90 tablet 0  . cyclobenzaprine (FLEXERIL) 5 MG tablet Take 1 tablet (5 mg total) by mouth 3 (three) times daily as needed for muscle spasms. 30 tablet 0  . docusate sodium (COLACE) 100 MG capsule Take 1 capsule (100 mg total) by mouth 2 (two) times daily. 60 capsule 1  . gabapentin (NEURONTIN) 300 MG capsule Take 1 capsule (300 mg total) by mouth 3 (three) times daily as needed.  270 capsule 1  . hydrochlorothiazide (HYDRODIURIL) 25 MG tablet Take 1 tablet (25 mg total) by mouth daily. 90 tablet 0  . hydrOXYzine (VISTARIL) 25 MG capsule Take 1 capsule (25 mg total) by mouth 3 (three) times daily as needed. 90 capsule 0  . pravastatin (PRAVACHOL) 40 MG tablet Take 1 tablet (40 mg total) by mouth daily. 90 tablet 0  . triamcinolone cream (KENALOG) 0.1 % Apply 1 application topically 2 (two) times daily. 80 g 0   No current facility-administered medications for this visit.      Review of Systems Full ROS  was asked and was negative except for the information on the HPI  Physical Exam Blood pressure (!) 158/93, pulse (!) 106, temperature 97.7 F (36.5 C), temperature source Temporal, resp. rate 14, height 5\' 8"  (1.727  m), weight 181 lb 12.8 oz (82.5 kg), SpO2 92 %. CONSTITUTIONAL: NAD , she greets with covid Elbow bump EYES: Pupils are equal, round, and reactive to light, Sclera are non-icteric. EARS, NOSE, MOUTH AND THROAT: The oropharynx is clear. The oral mucosa is pink and moist. Hearing is intact to voice. LYMPH NODES:  Lymph nodes in the neck are normal. RESPIRATORY:  Lungs are clear. There is normal respiratory effort, with equal breath sounds bilaterally, and without pathologic use of accessory muscles. CARDIOVASCULAR: Heart is regular without murmurs, gallops, or rubs. GI: The abdomen is  soft, nontender, and nondistended.  Periumbilical ventral hernia.  There are no palpable masses. There is no hepatosplenomegaly. There are normal bowel sounds in all quadrants. GU: Rectal deferred.   MUSCULOSKELETAL: Normal muscle strength and tone. No cyanosis or edema.   SKIN: Turgor is good and there are no pathologic skin lesions or ulcers. NEUROLOGIC: Motor and sensation is grossly normal. Cranial nerves are grossly intact. PSYCH:  Oriented to person, place and time. Affect is normal.  Data Reviewed  I have personally reviewed the patient's imaging, laboratory findings and medical records.    Assessment/Plan 68 year old female with a ventral hernia.  I do think that she is a good candidate for robotic approach.  Procedure discussed with the patient in detail.  Risk benefit and possible complications including but not limited to: Bleeding, infection, mesh issues, seroma formation and chronic pain.  She understands and wishes to proceed. I spent 40 minutes in this encounter with greater than 50% spent in coordination and counseling of her care  Caroleen Hamman, MD [place] [facility]geon 10/22/2018, 9:08 AM

## 2018-10-23 ENCOUNTER — Telehealth: Payer: Self-pay | Admitting: Surgery

## 2018-10-23 NOTE — Telephone Encounter (Signed)
Pt has been advised of pre admission date/time, Covid Testing date and Surgery date.  Surgery Date: 11/26/18 with Dr Kris Mouton assisted ventral hernia repair.  Preadmission Testing Date: 11/19/18 between 8-1:00pm-phone interview.  Covid Testing Date: 11/23/18 between 8-10:30am - patient advised to go to the [facility] ([place]  [employer] Video sent via [employer] [facility] and [employer].  Patient has been made aware to call [phone], between 1-3:00pm the day before surgery, to find out what time to arrive.

## 2018-10-24 ENCOUNTER — Telehealth: Payer: Self-pay | Admitting: Family Medicine

## 2018-10-26 NOTE — Telephone Encounter (Signed)
Pt calling to check on this.  States that she is almost out and the pharmacy won't refill.

## 2018-10-26 NOTE — Telephone Encounter (Signed)
Called RX in to [facility] as they state it was not received. Patient notified that this was done for her.

## 2018-10-29 ENCOUNTER — Other Ambulatory Visit: Payer: Medicare Other

## 2018-11-02 ENCOUNTER — Other Ambulatory Visit: Payer: Medicare Other

## 2018-11-11 ENCOUNTER — Telehealth: Payer: Self-pay

## 2018-11-19 ENCOUNTER — Other Ambulatory Visit: Payer: Self-pay

## 2018-11-19 ENCOUNTER — Encounter
Admission: RE | Admit: 2018-11-19 | Discharge: 2018-11-19 | Disposition: A | Discharge status: Home / Self Care | Payer: Medicare Other | Source: Ambulatory Visit | Attending: Surgery | Admitting: Surgery

## 2018-11-19 HISTORY — DX: Depression, unspecified: F32.A

## 2018-11-23 ENCOUNTER — Other Ambulatory Visit: Payer: Medicare Other

## 2018-11-23 ENCOUNTER — Encounter
Admission: RE | Admit: 2018-11-23 | Discharge: 2018-11-23 | Disposition: A | Discharge status: Home / Self Care | Payer: Medicare Other | Source: Ambulatory Visit | Attending: Surgery | Admitting: Surgery

## 2018-11-23 ENCOUNTER — Other Ambulatory Visit: Payer: Self-pay

## 2018-11-23 DIAGNOSIS — Z01810 Encounter for preprocedural cardiovascular examination: Secondary | ICD-10-CM | POA: Diagnosis not present

## 2018-11-23 DIAGNOSIS — Z01818 Encounter for other preprocedural examination: Secondary | ICD-10-CM | POA: Insufficient documentation

## 2018-11-23 DIAGNOSIS — Z20828 Contact with and (suspected) exposure to other viral communicable diseases: Secondary | ICD-10-CM | POA: Diagnosis not present

## 2018-11-23 DIAGNOSIS — I10 Essential (primary) hypertension: Secondary | ICD-10-CM | POA: Insufficient documentation

## 2018-11-23 LAB — SARS CORONAVIRUS 2 (TAT 6-24 HRS): SARS Coronavirus 2: NEGATIVE

## 2018-11-23 NOTE — Care Management (Signed)
EKG reviewed. No ST depressions seen in anterior leads. EKG ok, proceed.

## 2018-11-25 ENCOUNTER — Telehealth: Payer: Self-pay

## 2018-11-25 MED ORDER — CEFAZOLIN SODIUM-DEXTROSE 2-4 GM/100ML-% IV SOLN
2.0000 g | INTRAVENOUS | Status: AC
  Administered 2018-11-26: 08:00:00 2 g via INTRAVENOUS

## 2018-11-25 NOTE — Telephone Encounter (Signed)
-----   Message from Jules Husbands, MD sent at 11/24/2018  6:29 PM EST ----- Let her know she is covid negative ----- Message ----- From: Buel Ream, Lab In [place] Sent: 11/23/2018   5:46 PM EST To: Jules Husbands, MD

## 2018-11-25 NOTE — Telephone Encounter (Signed)
Called patient to notify her of negative results from patient's recent COVID testing.

## 2018-11-26 ENCOUNTER — Other Ambulatory Visit: Payer: Self-pay

## 2018-11-26 ENCOUNTER — Ambulatory Visit
Admission: RE | Admit: 2018-11-26 | Discharge: 2018-11-26 | Disposition: A | Discharge status: Home / Self Care | Payer: Medicare Other | Attending: Surgery | Admitting: Surgery

## 2018-11-26 ENCOUNTER — Encounter
Admission: RE | Disposition: A | Discharge status: Home / Self Care | Payer: Self-pay | Source: Home / Self Care | Attending: Surgery

## 2018-11-26 ENCOUNTER — Encounter: Payer: Self-pay | Admitting: *Deleted

## 2018-11-26 ENCOUNTER — Ambulatory Visit: Payer: Medicare Other | Admitting: Anesthesiology

## 2018-11-26 DIAGNOSIS — F329 Major depressive disorder, single episode, unspecified: Secondary | ICD-10-CM | POA: Insufficient documentation

## 2018-11-26 DIAGNOSIS — I10 Essential (primary) hypertension: Secondary | ICD-10-CM | POA: Diagnosis not present

## 2018-11-26 DIAGNOSIS — K439 Ventral hernia without obstruction or gangrene: Secondary | ICD-10-CM

## 2018-11-26 DIAGNOSIS — Z87891 Personal history of nicotine dependence: Secondary | ICD-10-CM | POA: Diagnosis not present

## 2018-11-26 DIAGNOSIS — Z79899 Other long term (current) drug therapy: Secondary | ICD-10-CM | POA: Insufficient documentation

## 2018-11-26 HISTORY — PX: XI ROBOTIC ASSISTED VENTRAL HERNIA: SHX6789

## 2018-11-26 SURGERY — REPAIR, HERNIA, VENTRAL, ROBOT-ASSISTED
Anesthesia: General

## 2018-11-26 MED ORDER — BUPIVACAINE LIPOSOME 1.3 % IJ SUSP
INTRAMUSCULAR | Status: DC | PRN
  Administered 2018-11-26: 20 mL

## 2018-11-26 MED ORDER — MIDAZOLAM HCL 2 MG/2ML IJ SOLN
INTRAMUSCULAR | Status: AC
  Filled 2018-11-26: qty 2

## 2018-11-26 MED ORDER — CELECOXIB 200 MG PO CAPS
200.0000 mg | ORAL_CAPSULE | ORAL | Status: AC
  Administered 2018-11-26: 06:00:00 200 mg via ORAL

## 2018-11-26 MED ORDER — ROCURONIUM BROMIDE 100 MG/10ML IV SOLN
INTRAVENOUS | Status: DC | PRN
  Administered 2018-11-26: 50 mg via INTRAVENOUS
  Administered 2018-11-26 (×2): 10 mg via INTRAVENOUS

## 2018-11-26 MED ORDER — CELECOXIB 200 MG PO CAPS
ORAL_CAPSULE | ORAL | Status: AC
  Administered 2018-11-26: 06:00:00 200 mg via ORAL
  Filled 2018-11-26: qty 1

## 2018-11-26 MED ORDER — FENTANYL CITRATE (PF) 100 MCG/2ML IJ SOLN
25.0000 ug | INTRAMUSCULAR | Status: DC | PRN
  Administered 2018-11-26 (×3): 25 ug via INTRAVENOUS

## 2018-11-26 MED ORDER — HYDROCODONE-ACETAMINOPHEN 5-325 MG PO TABS: 1.0000 | ORAL_TABLET | ORAL | 0 refills | Status: DC | PRN

## 2018-11-26 MED ORDER — FENTANYL CITRATE (PF) 100 MCG/2ML IJ SOLN
INTRAMUSCULAR | Status: DC | PRN
  Administered 2018-11-26: 50 ug via INTRAVENOUS
  Administered 2018-11-26: 100 ug via INTRAVENOUS

## 2018-11-26 MED ORDER — ACETAMINOPHEN 500 MG PO TABS
1000.0000 mg | ORAL_TABLET | ORAL | Status: AC
  Administered 2018-11-26: 06:00:00 1000 mg via ORAL

## 2018-11-26 MED ORDER — SUCCINYLCHOLINE CHLORIDE 20 MG/ML IJ SOLN
INTRAMUSCULAR | Status: DC | PRN
  Administered 2018-11-26: 100 mg via INTRAVENOUS

## 2018-11-26 MED ORDER — ONDANSETRON HCL 4 MG/2ML IJ SOLN
INTRAMUSCULAR | Status: DC | PRN
  Administered 2018-11-26: 4 mg via INTRAVENOUS

## 2018-11-26 MED ORDER — HYDROCODONE-ACETAMINOPHEN 5-325 MG PO TABS
1.0000 | ORAL_TABLET | Freq: Once | ORAL | Status: AC
  Administered 2018-11-26: 13:00:00 1 via ORAL

## 2018-11-26 MED ORDER — ACETAMINOPHEN 500 MG PO TABS
ORAL_TABLET | ORAL | Status: AC
  Administered 2018-11-26: 06:00:00 1000 mg via ORAL
  Filled 2018-11-26: qty 2

## 2018-11-26 MED ORDER — FENTANYL CITRATE (PF) 100 MCG/2ML IJ SOLN
INTRAMUSCULAR | Status: AC
  Administered 2018-11-26: 10:00:00 25 ug via INTRAVENOUS
  Filled 2018-11-26: qty 2

## 2018-11-26 MED ORDER — LIDOCAINE HCL (CARDIAC) PF 100 MG/5ML IV SOSY
PREFILLED_SYRINGE | INTRAVENOUS | Status: DC | PRN
  Administered 2018-11-26: 80 mg via INTRAVENOUS

## 2018-11-26 MED ORDER — IPRATROPIUM-ALBUTEROL 0.5-2.5 (3) MG/3ML IN SOLN
3.0000 mL | Freq: Once | RESPIRATORY_TRACT | Status: AC
  Administered 2018-11-26: 11:00:00 3 mL via RESPIRATORY_TRACT

## 2018-11-26 MED ORDER — IPRATROPIUM-ALBUTEROL 0.5-2.5 (3) MG/3ML IN SOLN
RESPIRATORY_TRACT | Status: AC
  Administered 2018-11-26: 3 mL via RESPIRATORY_TRACT
  Filled 2018-11-26: qty 3

## 2018-11-26 MED ORDER — PROPOFOL 10 MG/ML IV BOLUS
INTRAVENOUS | Status: AC
  Filled 2018-11-26: qty 20

## 2018-11-26 MED ORDER — IPRATROPIUM-ALBUTEROL 0.5-2.5 (3) MG/3ML IN SOLN
RESPIRATORY_TRACT | Status: AC
  Filled 2018-11-26: qty 3

## 2018-11-26 MED ORDER — SODIUM CHLORIDE FLUSH 0.9 % IV SOLN
INTRAVENOUS | Status: AC
  Filled 2018-11-26: qty 10

## 2018-11-26 MED ORDER — KETOROLAC TROMETHAMINE 30 MG/ML IJ SOLN
INTRAMUSCULAR | Status: DC | PRN
  Administered 2018-11-26: 15 mg via INTRAVENOUS

## 2018-11-26 MED ORDER — ROCURONIUM BROMIDE 50 MG/5ML IV SOLN
INTRAVENOUS | Status: AC
  Filled 2018-11-26: qty 1

## 2018-11-26 MED ORDER — ONDANSETRON HCL 4 MG/2ML IJ SOLN
INTRAMUSCULAR | Status: AC
  Filled 2018-11-26: qty 2

## 2018-11-26 MED ORDER — CHLORHEXIDINE GLUCONATE CLOTH 2 % EX PADS: 6.0000 | MEDICATED_PAD | Freq: Once | CUTANEOUS | Status: DC

## 2018-11-26 MED ORDER — CEFAZOLIN SODIUM-DEXTROSE 2-4 GM/100ML-% IV SOLN
INTRAVENOUS | Status: AC
  Filled 2018-11-26: qty 100

## 2018-11-26 MED ORDER — LACTATED RINGERS IV SOLN
INTRAVENOUS | Status: DC
  Administered 2018-11-26: 07:00:00 via INTRAVENOUS

## 2018-11-26 MED ORDER — SUGAMMADEX SODIUM 200 MG/2ML IV SOLN
INTRAVENOUS | Status: DC | PRN
  Administered 2018-11-26: 160 mg via INTRAVENOUS

## 2018-11-26 MED ORDER — PROPOFOL 10 MG/ML IV BOLUS
INTRAVENOUS | Status: DC | PRN
  Administered 2018-11-26: 160 mg via INTRAVENOUS

## 2018-11-26 MED ORDER — EPINEPHRINE PF 1 MG/ML IJ SOLN
INTRAMUSCULAR | Status: AC
  Filled 2018-11-26: qty 1

## 2018-11-26 MED ORDER — ONDANSETRON HCL 4 MG/2ML IJ SOLN
4.0000 mg | Freq: Once | INTRAMUSCULAR | Status: AC | PRN
  Administered 2018-11-26: 4 mg via INTRAVENOUS

## 2018-11-26 MED ORDER — LIDOCAINE HCL (PF) 2 % IJ SOLN
INTRAMUSCULAR | Status: AC
  Filled 2018-11-26: qty 10

## 2018-11-26 MED ORDER — BUPIVACAINE-EPINEPHRINE 0.25% -1:200000 IJ SOLN
INTRAMUSCULAR | Status: DC | PRN
  Administered 2018-11-26: 30 mL

## 2018-11-26 MED ORDER — BUPIVACAINE LIPOSOME 1.3 % IJ SUSP
INTRAMUSCULAR | Status: AC
  Filled 2018-11-26: qty 20

## 2018-11-26 MED ORDER — DEXAMETHASONE SODIUM PHOSPHATE 10 MG/ML IJ SOLN
INTRAMUSCULAR | Status: AC
  Filled 2018-11-26: qty 1

## 2018-11-26 MED ORDER — IPRATROPIUM-ALBUTEROL 0.5-2.5 (3) MG/3ML IN SOLN
3.0000 mL | RESPIRATORY_TRACT | Status: AC
  Administered 2018-11-26: 11:00:00 3 mL via RESPIRATORY_TRACT

## 2018-11-26 MED ORDER — FAMOTIDINE 20 MG PO TABS
20.0000 mg | ORAL_TABLET | Freq: Once | ORAL | Status: AC
  Administered 2018-11-26: 06:00:00 20 mg via ORAL

## 2018-11-26 MED ORDER — GABAPENTIN 300 MG PO CAPS
ORAL_CAPSULE | ORAL | Status: AC
  Filled 2018-11-26: qty 1

## 2018-11-26 MED ORDER — FAMOTIDINE 20 MG PO TABS
ORAL_TABLET | ORAL | Status: AC
  Administered 2018-11-26: 06:00:00 20 mg via ORAL
  Filled 2018-11-26: qty 1

## 2018-11-26 MED ORDER — DEXAMETHASONE SODIUM PHOSPHATE 10 MG/ML IJ SOLN
INTRAMUSCULAR | Status: DC | PRN
  Administered 2018-11-26: 5 mg via INTRAVENOUS

## 2018-11-26 MED ORDER — MIDAZOLAM HCL 2 MG/2ML IJ SOLN
INTRAMUSCULAR | Status: DC | PRN
  Administered 2018-11-26: 2 mg via INTRAVENOUS

## 2018-11-26 MED ORDER — FENTANYL CITRATE (PF) 250 MCG/5ML IJ SOLN
INTRAMUSCULAR | Status: AC
  Filled 2018-11-26: qty 5

## 2018-11-26 MED ORDER — GABAPENTIN 300 MG PO CAPS: 300.0000 mg | ORAL_CAPSULE | ORAL | Status: DC

## 2018-11-26 MED ORDER — BUPIVACAINE HCL (PF) 0.25 % IJ SOLN
INTRAMUSCULAR | Status: AC
  Filled 2018-11-26: qty 30

## 2018-11-26 SURGICAL SUPPLY — 49 items
BLADE SURG SZ11 CARB STEEL (BLADE) ×3 IMPLANT
CANISTER SUCT 1200ML W/VALVE (MISCELLANEOUS) ×3 IMPLANT
CHLORAPREP W/TINT 26 (MISCELLANEOUS) ×3 IMPLANT
COVER TIP SHEARS 8 DVNC (MISCELLANEOUS) ×1 IMPLANT
COVER TIP SHEARS 8MM DA VINCI (MISCELLANEOUS) ×2
COVER WAND RF STERILE (DRAPES) ×3 IMPLANT
DEFOGGER SCOPE WARMER CLEARIFY (MISCELLANEOUS) ×3 IMPLANT
DERMABOND ADVANCED (GAUZE/BANDAGES/DRESSINGS) ×2
DERMABOND ADVANCED .7 DNX12 (GAUZE/BANDAGES/DRESSINGS) ×1 IMPLANT
DRAPE 3/4 80X56 (DRAPES) ×3 IMPLANT
DRAPE ARM DVNC X/XI (DISPOSABLE) ×4 IMPLANT
DRAPE COLUMN DVNC XI (DISPOSABLE) ×1 IMPLANT
DRAPE DA VINCI XI ARM (DISPOSABLE) ×8
DRAPE DA VINCI XI COLUMN (DISPOSABLE) ×2
ELECT CAUTERY BLADE 6.4 (BLADE) ×3 IMPLANT
ELECT REM PT RETURN 9FT ADLT (ELECTROSURGICAL) ×3
ELECTRODE REM PT RTRN 9FT ADLT (ELECTROSURGICAL) ×1 IMPLANT
GLOVE BIO SURGEON STRL SZ7 (GLOVE) ×8 IMPLANT
GOWN STRL REUS W/ TWL LRG LVL3 (GOWN DISPOSABLE) ×3 IMPLANT
GOWN STRL REUS W/TWL LRG LVL3 (GOWN DISPOSABLE) ×6
GRASPER SUT TROCAR 14GX15 (MISCELLANEOUS) ×3 IMPLANT
IRRIGATION STRYKERFLOW (MISCELLANEOUS) IMPLANT
IRRIGATOR STRYKERFLOW (MISCELLANEOUS)
IV NS 1000ML (IV SOLUTION)
IV NS 1000ML BAXH (IV SOLUTION) IMPLANT
KIT PINK PAD W/HEAD ARE REST (MISCELLANEOUS) ×3
KIT PINK PAD W/HEAD ARM REST (MISCELLANEOUS) ×1 IMPLANT
LABEL OR SOLS (LABEL) ×3 IMPLANT
MESH VENT LT ST 10.2X15.2CM EL (Mesh General) ×2 IMPLANT
NEEDLE HYPO 22GX1.5 SAFETY (NEEDLE) ×3 IMPLANT
OBTURATOR OPTICAL STANDARD 8MM (TROCAR) ×2
OBTURATOR OPTICAL STND 8 DVNC (TROCAR) ×1
OBTURATOR OPTICALSTD 8 DVNC (TROCAR) ×1 IMPLANT
PACK LAP CHOLECYSTECTOMY (MISCELLANEOUS) ×3 IMPLANT
PENCIL ELECTRO HAND CTR (MISCELLANEOUS) ×3 IMPLANT
SEAL CANN UNIV 5-8 DVNC XI (MISCELLANEOUS) ×3 IMPLANT
SEAL XI 5MM-8MM UNIVERSAL (MISCELLANEOUS) ×6
SOLUTION ELECTROLUBE (MISCELLANEOUS) ×3 IMPLANT
SPONGE LAP 18X18 RF (DISPOSABLE) ×3 IMPLANT
SUT MNCRL 4-0 (SUTURE) ×2
SUT MNCRL 4-0 27XMFL (SUTURE) ×1
SUT STRATAFIX 0 PDS+ CT-2 23 (SUTURE) ×3
SUT STRATAFIX SPIRAL PDS+ 0 30 (SUTURE) ×3 IMPLANT
SUT VICRYL 0 AB UR-6 (SUTURE) ×6 IMPLANT
SUT VLOC 90 2/L VL 12 GS22 (SUTURE) ×6 IMPLANT
SUTURE MNCRL 4-0 27XMF (SUTURE) ×1 IMPLANT
SUTURE STRATFX 0 PDS+ CT-2 23 (SUTURE) IMPLANT
TROCAR 130MM GELPORT  DAV (MISCELLANEOUS) ×3 IMPLANT
TUBING EVAC SMOKE HEATED PNEUM (TUBING) ×3 IMPLANT

## 2018-11-26 NOTE — Op Note (Signed)
Robotic assisted laparoscopic ventral hernia Repair IPOM using oval 15x10cms ventralight BARD mesh   Pre-operative Diagnosis: ventral hernia   Post-operative Diagnosis: same   Surgeon: Caroleen Hamman, MD FACS   Anesthesia: Gen. with endotracheal tube     Findings: 4.5 cms ventral hernia    Estimated Blood Loss: 5cc         Complications: none             Procedure Details  The patient was seen again in the Holding Room. The benefits, complications, treatment options, and expected outcomes were discussed with the patient. The risks of bleeding, infection, recurrence of symptoms, failure to resolve symptoms, bowel injury, mesh placement, mesh infection, any of which could require further surgery were reviewed with the patient. The likelihood of improving the patient's symptoms with return to their baseline status is good.  The patient and/or family concurred with the proposed plan, giving informed consent.  The patient was taken to Operating Room, identified and the procedure verified.  A Time Out was held and the above information confirmed.   Prior to the induction of general anesthesia, antibiotic prophylaxis was administered. VTE prophylaxis was in place. General endotracheal anesthesia was then administered and tolerated well. After the induction, the abdomen was prepped with Chloraprep and draped in the sterile fashion. The patient was positioned in the supine position.   We used a left upper quadrant subcostal incision and using a cutdown technique were able to identify the fascia both anteriorly and posteriorly elevated incised and 2 stay sutures were placed.  Hassan trocar inserted and pneumoperitoneum obtained.  No hemodynamic compromise.   2 additional 8 mm ports were placed under direct visualization.  I visualized the hernia and there was a ventral hernia measuring approximately 4.5 cm.  At this time I went ahead and inserted the 10x15cm mesh with an echo location.   The robot was  brought to the surgical field and docked in the standard fashion.  We made sure that all instrumentation was kept under direct vision at all times and there was no collision between the arms.  I scrubbed out and went to the console.   Confirm and measured that the defect was 4.5 cm.  .  Using two 0 stratafix suture we closed the ventral defect primarily.  The PMI was used to pierce the defect in the center.  Using the mesh and the echo location device we were able to bring the mesh towards the abdominal wall. Falciform was taken down with electrocautery to allow adequate mesh placement.   The mesh was secured circumferentially to the abdominal wall using 2 OV lock in the standard fashion.   The mesh layed really nicely against the  abdominal wall. A second look laparoscopy revealed no evidence of intra-abdominal injury.    All the needles and foreign objects were removed under direct visualization.  The instruments were removed and the robot was undocked.  I scrubbed back in, The laparoscopic ports were removed under direct visualization and the pneumoperitoneum was deflated.   Incisions were closed with  4-0 Monocryl  And the fascial sutures approximated in the standard fashion Dermabond was used to coat the skin.liposomal Marcaine  was used to inject all the incision sites. Patient tolerated procedure well and there were no immediate complications. Needle and laparotomy counts were correct    Caroleen Hamman, MD, FACS

## 2018-11-26 NOTE — Progress Notes (Signed)
Pt in PACU; sleepy but alert, shallow breathing due to pain from abdominal surgery. Incentive spirometer teaching given; pt continues to breathe shallow.  Pt 94% on 2L [place];  able to answer questions appropriately, tolerating fluids.  Pt 88% on room air. Duoneb given x 1 per Dr. Kayleen Memos. Pt still unable to maintain on room air. Dr. Kayleen Memos at bedside to evaluate patient. Unable to give pain medications due to oxygen levels. Per Dr. Kayleen Memos, give another duoneb, continue to assess.  Will notify Dr. Dahlia Byes.

## 2018-11-26 NOTE — Anesthesia Preprocedure Evaluation (Signed)
Anesthesia Evaluation  Patient identified by MRN, date of birth, ID band Patient awake    Reviewed: Allergy & Precautions, NPO status , Patient's Chart, lab work & pertinent test results  History of Anesthesia Complications Negative for: history of anesthetic complications  Airway Mallampati: II       Dental   Pulmonary neg sleep apnea, neg COPD, Current Smoker (vapes), former smoker,           Cardiovascular hypertension, Pt. on medications (-) Past MI and (-) CHF (-) dysrhythmias (-) Valvular Problems/Murmurs     Neuro/Psych neg Seizures Anxiety Depression    GI/Hepatic Neg liver ROS, neg GERD  ,  Endo/Other  neg diabetes  Renal/GU negative Renal ROS     Musculoskeletal   Abdominal   Peds  Hematology   Anesthesia Other Findings   Reproductive/Obstetrics                             Anesthesia Physical Anesthesia Plan  ASA: II  Anesthesia Plan: General   Post-op Pain Management:    Induction: Intravenous  PONV Risk Score and Plan: 3 and Dexamethasone, Ondansetron and Midazolam  Airway Management Planned: Oral ETT  Additional Equipment:   Intra-op Plan:   Post-operative Plan:   Informed Consent: I have reviewed the patients History and Physical, chart, labs and discussed the procedure including the risks, benefits and alternatives for the proposed anesthesia with the patient or authorized representative who has indicated his/her understanding and acceptance.       Plan Discussed with:   Anesthesia Plan Comments:         Anesthesia Quick Evaluation

## 2018-11-26 NOTE — Anesthesia Procedure Notes (Signed)
Procedure Name: Intubation Performed by: Layli Capshaw Ben, CRNA Pre-anesthesia Checklist: Patient identified, Emergency Drugs available, Suction available and Patient being monitored Patient Re-evaluated:Patient Re-evaluated prior to induction Oxygen Delivery Method: Circle system utilized Preoxygenation: Pre-oxygenation with 100% oxygen Induction Type: IV induction Ventilation: Mask ventilation without difficulty Laryngoscope Size: Mac and 4 Grade View: Grade I Tube type: Oral Tube size: 7.0 mm Number of attempts: 1 Airway Equipment and Method: Stylet and Oral airway Placement Confirmation: ETT inserted through vocal cords under direct vision,  positive ETCO2 and breath sounds checked- equal and bilateral Secured at: 21 cm Tube secured with: Tape Dental Injury: Teeth and Oropharynx as per pre-operative assessment        

## 2018-11-26 NOTE — Transfer of Care (Signed)
Immediate Anesthesia Transfer of Care Note  Patient: Marcia [name]  Procedure(s) Performed: XI ROBOTIC ASSISTED VENTRAL HERNIA (N/A )  Patient Location: PACU  Anesthesia Type:General  Level of Consciousness: awake and alert   Airway & Oxygen Therapy: Patient Spontanous Breathing and Patient connected to face mask oxygen  Post-op Assessment: Report given to RN and Post -op Vital signs reviewed and stable  Post vital signs: Reviewed and stable  Last Vitals:  Vitals Value Taken Time  BP 160/89 11/26/18 0936  Temp 36.7 C 11/26/18 0936  Pulse 88 11/26/18 0939  Resp 20 11/26/18 0939  SpO2 95 % 11/26/18 0939  Vitals shown include unvalidated device data.  Last Pain:  Vitals:   11/26/18 0936  TempSrc:   PainSc: Asleep         Complications: No apparent anesthesia complications

## 2018-11-26 NOTE — H&P (Signed)
HPI Marcia [name] is a 68 y.o. female well-known to me with a prior history of sigmoid colectomy performed last year by Dr. Rosana Hoes.  She developed a ventral hernia defect.  I did order a CT scan that I have personally reviewed showing evidence of the hernia defect there is no much loss of domain and I do think that minimally invasive approach is more than appropriate in the setting.  She is able to perform more than 4 METS of activity without any shortness of breath or chest pain.  She has minimal discomfort around the hernia site.  There is no evidence of incarceration or strangulation.  No previous mesh. CBC and CMP nml. she greets with Elbow bump   HPI      Past Medical History:  Diagnosis Date  . Anxiety   . Hyperlipidemia   . Hypertension   . Perforated bowel ([place]) 12/06/2017         Past Surgical History:  Procedure Laterality Date  . BREAST BIOPSY Left 2015   CORE W/CLIP - NEG  . BREAST BIOPSY Left 07/2007   neg bx   . CESAREAN SECTION    . COLON SURGERY    . COLONOSCOPY WITH PROPOFOL N/A 02/18/2018   Procedure: COLONOSCOPY WITH PROPOFOL;  Surgeon: Jonathon Bellows, MD;  Location: [facility] ENDOSCOPY;  Service: Gastroenterology;  Laterality: N/A;  . COLONOSCOPY WITH PROPOFOL N/A 03/25/2018   Procedure: COLONOSCOPY WITH PROPOFOL;  Surgeon: Jonathon Bellows, MD;  Location[facility] ENDOSCOPY;  Service: Gastroenterology;  Laterality: N/A;  . COLONOSCOPY WITH PROPOFOL N/A 08/25/2018   Procedure: COLONOSCOPY WITH PROPOFOL;  Surgeon: Lin Landsman, MD;  Location: [facility] ENDOSCOPY;  Service: Gastroenterology;  Laterality: N/A;  . LAPAROTOMY N/A 12/06/2017   Procedure: EXPLORATORY LAPAROTOMY, sigmoid colectomy, anastomosis;  Surgeon: Vickie Epley, MD;  Location: [facility] ORS;  Service: General;  Laterality: N/A;  . TUBAL LIGATION           Family History  Problem Relation Age of Onset  . Breast cancer Mother   . Liver cancer Sister   . Pancreatic cancer Sister   .  Stomach cancer Brother   . Lung cancer Brother   . Brain cancer Sister   . Diabetes Father   . Heart disease Father   . COPD Neg Hx   . Stroke Neg Hx     Social History Social History        Tobacco Use  . Smoking status: Former Smoker    Types: Cigarettes  . Smokeless tobacco: Never Used  . Tobacco comment: quit over 15 years ago   Substance Use Topics  . Alcohol use: No  . Drug use: No    No Known Allergies        Current Outpatient Medications  Medication Sig Dispense Refill  . BELSOMRA 10 MG TABS Take 10 mg by mouth at bedtime as needed (for sleep). 30 tablet 2  . citalopram (CELEXA) 40 MG tablet Take 1 tablet (40 mg total) by mouth daily. 90 tablet 1  . cloNIDine (CATAPRES) 0.1 MG tablet Take 1 tablet (0.1 mg total) by mouth daily. 90 tablet 0  . cyclobenzaprine (FLEXERIL) 5 MG tablet Take 1 tablet (5 mg total) by mouth 3 (three) times daily as needed for muscle spasms. 30 tablet 0  . docusate sodium (COLACE) 100 MG capsule Take 1 capsule (100 mg total) by mouth 2 (two) times daily. 60 capsule 1  . gabapentin (NEURONTIN) 300 MG capsule Take 1 capsule (300 mg total)  by mouth 3 (three) times daily as needed. 270 capsule 1  . hydrochlorothiazide (HYDRODIURIL) 25 MG tablet Take 1 tablet (25 mg total) by mouth daily. 90 tablet 0  . hydrOXYzine (VISTARIL) 25 MG capsule Take 1 capsule (25 mg total) by mouth 3 (three) times daily as needed. 90 capsule 0  . pravastatin (PRAVACHOL) 40 MG tablet Take 1 tablet (40 mg total) by mouth daily. 90 tablet 0  . triamcinolone cream (KENALOG) 0.1 % Apply 1 application topically 2 (two) times daily. 80 g 0   No current facility-administered medications for this visit.      Review of Systems Full ROS  was asked and was negative except for the information on the HPI  Physical Exam. CONSTITUTIONAL: NAD , she greets with covid Elbow bump EYES: Pupils are equal, round, and reactive to light, Sclera are  non-icteric. EARS, NOSE, MOUTH AND THROAT: The oropharynx is clear. The oral mucosa is pink and moist. Hearing is intact to voice. LYMPH NODES:  Lymph nodes in the neck are normal. RESPIRATORY:  Lungs are clear. There is normal respiratory effort, with equal breath sounds bilaterally, and without pathologic use of accessory muscles. CARDIOVASCULAR: Heart is regular without murmurs, gallops, or rubs. GI: The abdomen is  soft, nontender, and nondistended.  Periumbilical ventral hernia.  There are no palpable masses. There is no hepatosplenomegaly. There are normal bowel sounds in all quadrants. GU: Rectal deferred.   MUSCULOSKELETAL: Normal muscle strength and tone. No cyanosis or edema.   SKIN: Turgor is good and there are no pathologic skin lesions or ulcers. NEUROLOGIC: Motor and sensation is grossly normal. Cranial nerves are grossly intact. PSYCH:  Oriented to person, place and time. Affect is normal.  Data Reviewed  I have personally reviewed the patient's imaging, laboratory findings and medical records.    Assessment/Plan 68 year old female with a ventral hernia.  I do think that she is a good candidate for robotic approach.  Procedure discussed with the patient in detail.  Risk benefit and possible complications including but not limited to: Bleeding, infection, mesh issues, seroma formation and chronic pain.  She understands and wishes to proceed.  Sterling Big, MD [facility] General Surgeon

## 2018-11-26 NOTE — Discharge Instructions (Addendum)
Laparoscopic Ventral Hernia Repair, Care After This sheet gives you information about how to care for yourself after your procedure. Your health care provider may also give you more specific instructions. If you have problems or questions, contact your health care provider. What can I expect after the procedure? After the procedure, it is common to have:  Pain, discomfort, or soreness. Follow these instructions at home: Incision care   Follow instructions from your health care provider about how to take care of your incision. Make sure you: ? Wash your hands with soap and water before you change your bandage (dressing) or before you touch your abdomen. If soap and water are not available, use hand sanitizer. ? Change your dressing as told by your health care provider. ? Leave stitches (sutures), skin glue, or adhesive strips in place. These skin closures may need to stay in place for 2 weeks or longer. If adhesive strip edges start to loosen and curl up, you may trim the loose edges. Do not remove adhesive strips completely unless your health care provider tells you to do that.  Check your incision area every day for signs of infection. Check for: ? Redness, swelling, or pain. ? Fluid or blood. ? Warmth. ? Pus or a bad smell. Bathing   Do not take baths, swim, or use a hot tub until your health care provider approves. Ask your health care provider if you can take showers. You may only be allowed to take sponge baths for bathing.  Keep your bandage (dressing) dry until your health care provider says it can be removed. Activity  Do not lift anything that is heavier than 10 lb (4.5 kg) until your health care provider approves.  Do not drive or use heavy machinery while taking prescription pain medicine. Ask your health care provider when it is safe for you to drive or use heavy machinery.  Do not drive for 24 hours if you were given a medicine to help you relax (sedative) during your  procedure.  Rest as told by your health care provider. You may return to your normal activities when your health care provider approves. General instructions  Take over-the-counter and prescription medicines only as told by your health care provider.  To prevent or treat constipation while you are taking prescription pain medicine, your health care provider may recommend that you: ? Take over-the-counter or prescription medicines. ? Eat foods that are high in fiber, such as fresh fruits and vegetables, whole grains, and beans. ? Limit foods that are high in fat and processed sugars, such as fried and sweet foods.  Drink enough fluid to keep your urine clear or pale yellow.  Hold a pillow over your abdomen when you cough or sneeze. This helps with pain.  Keep all follow-up visits as told by your health care provider. This is important. Contact a health care provider if:  You have: ? A fever or chills. ? Redness, swelling, or pain around your incision. ? Fluid or blood coming from your incision. ? Pus or a bad smell coming from your incision. ? Pain that gets worse or does not get better with medicine. ? Nausea or vomiting. ? A cough. ? Shortness of breath.  Your incision feels warm to the touch.  You have not had a bowel movement in three days.  You are not able to urinate. Get help right away if:  You have severe pain in your abdomen.  You have persistent nausea and vomiting.  You have   redness, warmth, or pain in your leg.  You have chest pain.  You have trouble breathing. Summary  After this procedure, it is common to have pain, discomfort, or soreness.  Follow instructions from your health care provider about how to take care of your incision.  Check your incision area every day for signs of infection. Report any signs of infection to your health care provider.  Keep all follow-up visits as told by your health care provider. This is important. This information  is not intended to replace advice given to you by your health care provider. Make sure you discuss any questions you have with your health care provider.  AMBULATORY SURGERY  DISCHARGE INSTRUCTIONS   1) The drugs that you were given will stay in your system until tomorrow so for the next 24 hours you should not:  A) Drive an automobile B) Make any legal decisions C) Drink any alcoholic beverage   2) You may resume regular meals tomorrow.  Today it is better to start with liquids and gradually work up to solid foods.  You may eat anything you prefer, but it is better to start with liquids, then soup and crackers, and gradually work up to solid foods.   3) Please notify your doctor immediately if you have any unusual bleeding, trouble breathing, redness and pain at the surgery site, drainage, fever, or pain not relieved by medication.    4) Additional Instructions:        Please contact your physician with any problems or Same Day Surgery at [phone], Monday through Friday 6 am to 4 pm, or [place] [facility] Main number at [phone]. Document Released: 12/25/2011 Document Revised: 12/20/2016 Document Reviewed: 08/30/2015 Elsevier Patient Education  2020 Elsevier Inc.  

## 2018-11-26 NOTE — Anesthesia Postprocedure Evaluation (Signed)
Anesthesia Post Note  Patient: Marcia [name]  Procedure(s) Performed: XI ROBOTIC ASSISTED VENTRAL HERNIA (N/A )  Patient location during evaluation: PACU Anesthesia Type: General Level of consciousness: awake and alert Pain management: pain level controlled Vital Signs Assessment: post-procedure vital signs reviewed and stable Respiratory status: spontaneous breathing and respiratory function stable Cardiovascular status: stable Anesthetic complications: no     Last Vitals:  Vitals:   11/26/18 0942 11/26/18 0945  BP:    Pulse: 86 86  Resp: 20 19  Temp:    SpO2: 94% 96%    Last Pain:  Vitals:   11/26/18 0942  TempSrc:   PainSc: 8                  Halee Glynn K

## 2018-11-26 NOTE — Progress Notes (Signed)
Pt O2 sats 92-93% on room air. Dr. Kayleen Memos notified. Acknowledged. States pt ok to go home.

## 2018-11-26 NOTE — Anesthesia Post-op Follow-up Note (Signed)
Anesthesia QCDR form completed.        

## 2018-11-27 ENCOUNTER — Encounter: Payer: Self-pay | Admitting: Surgery

## 2018-12-04 ENCOUNTER — Encounter: Payer: Self-pay | Admitting: Surgery

## 2018-12-07 ENCOUNTER — Encounter: Payer: Medicare Other | Admitting: Family Medicine

## 2018-12-08 ENCOUNTER — Encounter: Payer: Medicare Other | Admitting: Physician Assistant

## 2018-12-09 ENCOUNTER — Encounter: Payer: Self-pay | Admitting: Surgery

## 2018-12-09 ENCOUNTER — Ambulatory Visit (INDEPENDENT_AMBULATORY_CARE_PROVIDER_SITE_OTHER): Payer: Self-pay | Admitting: Surgery

## 2018-12-09 ENCOUNTER — Other Ambulatory Visit: Payer: Self-pay

## 2018-12-09 VITALS — BP 134/85 | HR 82 | Ht 68.0 in | Wt 185.0 lb

## 2018-12-09 DIAGNOSIS — Z09 Encounter for follow-up examination after completed treatment for conditions other than malignant neoplasm: Secondary | ICD-10-CM

## 2018-12-09 NOTE — Patient Instructions (Signed)
Return as needed.The patient is aware to call back for any questions or concerns.  

## 2018-12-10 ENCOUNTER — Other Ambulatory Visit: Payer: Self-pay

## 2018-12-11 ENCOUNTER — Ambulatory Visit (INDEPENDENT_AMBULATORY_CARE_PROVIDER_SITE_OTHER): Payer: Medicare Other | Admitting: Family Medicine

## 2018-12-11 ENCOUNTER — Other Ambulatory Visit (INDEPENDENT_AMBULATORY_CARE_PROVIDER_SITE_OTHER): Payer: Self-pay | Admitting: Surgery

## 2018-12-11 ENCOUNTER — Other Ambulatory Visit: Payer: Self-pay

## 2018-12-11 ENCOUNTER — Telehealth: Payer: Self-pay | Admitting: *Deleted

## 2018-12-11 ENCOUNTER — Encounter: Payer: Self-pay | Admitting: Family Medicine

## 2018-12-11 ENCOUNTER — Other Ambulatory Visit: Payer: Self-pay | Admitting: Surgery

## 2018-12-11 ENCOUNTER — Ambulatory Visit: Payer: Medicare Other | Admitting: *Deleted

## 2018-12-11 VITALS — BP 128/81 | HR 76 | Temp 98.3°F | Ht 67.0 in | Wt 179.0 lb

## 2018-12-11 DIAGNOSIS — I10 Essential (primary) hypertension: Secondary | ICD-10-CM

## 2018-12-11 DIAGNOSIS — G4700 Insomnia, unspecified: Secondary | ICD-10-CM

## 2018-12-11 DIAGNOSIS — S39011D Strain of muscle, fascia and tendon of abdomen, subsequent encounter: Secondary | ICD-10-CM

## 2018-12-11 DIAGNOSIS — K439 Ventral hernia without obstruction or gangrene: Secondary | ICD-10-CM

## 2018-12-11 DIAGNOSIS — F419 Anxiety disorder, unspecified: Secondary | ICD-10-CM | POA: Diagnosis not present

## 2018-12-11 DIAGNOSIS — G5701 Lesion of sciatic nerve, right lower limb: Secondary | ICD-10-CM

## 2018-12-11 DIAGNOSIS — E782 Mixed hyperlipidemia: Secondary | ICD-10-CM

## 2018-12-11 DIAGNOSIS — R7301 Impaired fasting glucose: Secondary | ICD-10-CM

## 2018-12-11 DIAGNOSIS — Z0000 Encounter for general adult medical examination without abnormal findings: Secondary | ICD-10-CM

## 2018-12-11 DIAGNOSIS — Z09 Encounter for follow-up examination after completed treatment for conditions other than malignant neoplasm: Secondary | ICD-10-CM

## 2018-12-11 MED ORDER — BELSOMRA 10 MG PO TABS: 10.0000 mg | ORAL_TABLET | Freq: Every day | ORAL | 5 refills | Status: DC

## 2018-12-11 MED ORDER — HYDROCODONE-ACETAMINOPHEN 5-325 MG PO TABS: 1.0000 | ORAL_TABLET | ORAL | 0 refills | Status: DC | PRN

## 2018-12-11 MED ORDER — CYCLOBENZAPRINE HCL 5 MG PO TABS: 5.0000 mg | ORAL_TABLET | Freq: Three times a day (TID) | ORAL | 0 refills | Status: DC | PRN

## 2018-12-11 NOTE — Chronic Care Management (AMB) (Signed)
Chronic Care Management   Follow Up Note   12/11/2018 Name: Marcia [name] MRN: [id number] DOB: 03/25/1950  Referred by: Particia Nearing, PA-C Reason for referral : Chronic Care Management (CCM outreach )   Marcia [name] is a 68 y.o. year old female who is a primary care patient of Particia Nearing, [place]. The CCM team was consulted for assistance with chronic disease management and care coordination needs.    Review of patient status, including review of consultants reports, relevant laboratory and other test results, and collaboration with appropriate care team members and the patient's provider was performed as part of comprehensive patient evaluation and provision of chronic care management services.    SDOH (Social Determinants of Health) screening performed today: Stress. See Care Plan for related entries.   Outpatient Encounter Medications as of 12/11/2018  Medication Sig  . BELSOMRA 10 MG TABS Take 10 mg by mouth at bedtime as needed (for sleep). (Patient taking differently: Take 10 mg by mouth at bedtime. )  . citalopram (CELEXA) 40 MG tablet Take 1 tablet (40 mg total) by mouth daily.  . cloNIDine (CATAPRES) 0.1 MG tablet Take 1 tablet (0.1 mg total) by mouth daily.  . cyclobenzaprine (FLEXERIL) 5 MG tablet Take 1 tablet (5 mg total) by mouth 3 (three) times daily as needed for muscle spasms.  [name] gabapentin (NEURONTIN) 300 MG capsule Take 1 capsule (300 mg total) by mouth 3 (three) times daily as needed.  . hydrochlorothiazide (HYDRODIURIL) 25 MG tablet Take 1 tablet (25 mg total) by mouth daily.  . pravastatin (PRAVACHOL) 40 MG tablet Take 1 tablet (40 mg total) by mouth daily.  [name] docusate sodium (COLACE) 100 MG capsule Take 1 capsule (100 mg total) by mouth 2 (two) times daily. (Patient not taking: Reported on 12/11/2018)  . HYDROcodone-acetaminophen (NORCO/VICODIN) 5-325 MG tablet Take 1-2 tablets by mouth every 4 (four) hours as needed for moderate pain.  (Patient not taking: Reported on 12/11/2018)  . hydrOXYzine (VISTARIL) 25 MG capsule Take 1 capsule (25 mg total) by mouth 3 (three) times daily as needed. (Patient not taking: Reported on 12/11/2018)  . triamcinolone cream (KENALOG) 0.1 % Apply 1 application topically 2 (two) times daily. (Patient not taking: Reported on 12/11/2018)   No facility-administered encounter medications on file as of 12/11/2018.      Goals Addressed            This Visit's Progress   . I want to keep my bones healthy (pt-stated)       Current Barriers:  . Chronic Disease Management support and education needs related to osteopenia  Nurse Case Manager Clinical Goal(s):  [name] Over the next 90 days, patient will demonstrate improved health management independence as evidenced by self-reported increased exercise, addition of high calcium foods and a vitamin D supplement  Interventions:  . Evaluation of current treatment plan related to Hypertension  and patient's adherence to plan as established by provider. . Advised patient to Exercise at least 3 times a week including lite weight training in her routine once she has been cleared by surgeon.  . Discussed plans with patient for ongoing care management follow up and provided patient with direct contact information for care management team . Patient checking her blood pressure at home and reports good control.  . Patient noted to have a rash to left lower leg, advised to talk to PCP about rash, Right hip pain and abdominal muscle pain.   Patient Self Care Activities:  .  Attends church or other social activities . Performs ADL's independently . Performs IADL's independently  Initial goal documentation         The care management team will reach out to the patient again over the next 30  days.  The patient has been provided with contact information for the care management team and has been advised to call with any health related questions or concerns.    Merlene Morse Laira Penninger RN, BSN Nurse Case Editor, [facility]ing [facility] Care Management  [phone]) Business Mobile

## 2018-12-11 NOTE — Patient Instructions (Signed)
Piriformis Syndrome Rehab Ask your health care provider which exercises are safe for you. Do exercises exactly as told by your health care provider and adjust them as directed. It is normal to feel mild stretching, pulling, tightness, or discomfort as you do these exercises. Stop right away if you feel sudden pain or your pain gets worse. Do not begin these exercises until told by your health care provider. Stretching and range-of-motion exercises These exercises warm up your muscles and joints and improve the movement and flexibility of your hip and pelvis. The exercises also help to relieve pain, numbness, and tingling. Hip rotation This is an exercise in which you lie on your back and stretch the muscles that rotate your hip (hip rotators) to stretch your buttocks. 1. Lie on your back on a firm surface. 2. Pull your left / right knee toward your same shoulder with your left / right hand until your knee is pointing toward the ceiling. Hold your left / right ankle with your other hand. 3. Keeping your knee steady, gently pull your left / right ankle toward your other shoulder until you feel a stretch in your buttocks. 4. Hold this position for __________ seconds. Repeat __________ times. Complete this exercise __________ times a day. Hip extensor This is an exercise in which you lie on your back and pull your knee to your chest. 1. Lie on your back on a firm surface. Both of your legs should be straight. 2. Pull your left / right knee to your chest. Hold your leg in this position by holding onto the back of your thigh or the front of your knee. 3. Hold this position for __________ seconds. 4. Slowly return to the starting position. Repeat __________ times. Complete this exercise __________ times a day. Strengthening exercises These exercises build strength and endurance in your hip and thigh muscles. Endurance is the ability to use your muscles for a long time, even after they get tired.  Straight leg raises, side-lying This exercise strengthens the muscles that rotate the leg at the hip and move it away from your body (hip abductors). 1. Lie on your side with your left / right leg in the top position. Lie so your head, shoulder, knee, and hip line up. Bend your bottom knee to help you balance. 2. Lift your top leg 4-6 inches (10-15 cm) while keeping your toes pointed straight ahead. 3. Hold this position for __________ seconds. 4. Slowly lower your leg to the starting position. 5. Let your muscles relax completely after each repetition. Repeat __________ times. Complete this exercise __________ times a day. Hip abduction and rotation This is sometimes called quadruped (on hands and knees) exercises. 1. Get on your hands and knees on a firm, lightly padded surface. Your hands should be directly below your shoulders, and your knees should be directly below your hips. 2. Lift your left / right knee out to the side. Keep your knee bent. Do not twist your body. 3. Hold this position for __________ seconds. 4. Slowly lower your leg. Repeat __________ times. Complete this exercise __________ times a day. Straight leg raises, face-down This exercise stretches the muscles that move your hips away from the front of the pelvis (hip extensors). 1. Lie on your abdomen on a bed or a firm surface with a pillow under your hips. 2. Squeeze your buttocks muscles and lift your left / right leg about 4-6 inches (10-15 cm) off the bed. Do not let your back arch. 3. Hold   this position for __________ seconds. 4. Slowly lower your leg to the starting position. 5. Let your muscles relax completely after each repetition. Repeat __________ times. Complete this exercise __________ times a day. This information is not intended to replace advice given to you by your health care provider. Make sure you discuss any questions you have with your health care provider. Document Released: 01/07/2005 Document  Revised: 04/30/2018 Document Reviewed: 10/30/2017 Elsevier Patient Education  2020 Elsevier Inc.  

## 2018-12-11 NOTE — Telephone Encounter (Signed)
Patient called and is still in pain from her surgery that was done on 11/26/18 by Dr.Pabon Ventral Hernia repair, she wants to get another refill on hydrocodone. Please call and advise

## 2018-12-11 NOTE — Progress Notes (Signed)
BP 128/81   Pulse 76   Temp 98.3 F (36.8 C) (Oral)   Ht 5\' 7"  (1.702 m)   Wt 179 lb (81.2 kg)   SpO2 94%   BMI 28.04 kg/m    Subjective:    Patient ID: , female    DOB: [birth year], 68 y.o.   MRN: [id number]  HPI: Marcia [name] is a 68 y.o. female presenting on 12/11/2018 for comprehensive medical examination. Current medical complaints include:see below  Recent hernia surgery 3 weeks ago, now having some superficial ttp to the left of umbilicus. Went back to 12/13/2018 for this and was told it was a muscle strain. Using ice as recommended but not getting much relief. Denies injury.   Still having issues with muscle soreness in right hip causing sciatic pain. States if she presses hard on her lateral hip she can relieve the tension. Not trying anything for this.   HTN - not closely checking home readings. Taking medications faithfully without side effects. Denies CP, SOB, HAs, dizziness.   HLD - Taking pravastatin without issue. Denies CP, SOB, HAs, dizziness. Trying to eat well, has been less active lately because of her recent surgery.   Anxiety - well controlled on celexa and prn hydroxyzine. No issues, Si/HI  Insomnia - continues to do well taking belsomra nightly. Does not wake up groggy, sleeping through the night for the most part.   She currently lives with: Menopausal Symptoms: no  Depression Screen done today and results listed below:  Depression screen [facility] 2/9 12/11/2018 06/03/2018 04/01/2018 12/03/2017 10/15/2017  Decreased Interest 0 1 0 3 0  Down, Depressed, Hopeless 0 0 0 3 0  PHQ - 2 Score 0 1 0 6 0  Altered sleeping 1 0 - 0 0  Tired, decreased energy 0 0 - 2 0  Change in appetite 0 0 - 2 0  Feeling bad or failure about yourself  0 0 - 0 0  Trouble concentrating 0 0 - 0 0  Moving slowly or fidgety/restless 0 0 - 0 0  Suicidal thoughts 0 0 - 0 0  PHQ-9 Score 1 1 - 10 0  Difficult doing work/chores - Not difficult at all - - -    The patient  does not have a history of falls. I did complete a risk assessment for falls. A plan of care for falls was documented.   Past Medical History:  Past Medical History:  Diagnosis Date  . Anxiety   . Depression   . Hyperlipidemia   . Hypertension   . Perforated bowel (HCC) 12/06/2017    Surgical History:  Past Surgical History:  Procedure Laterality Date  . BREAST BIOPSY Left 2015   CORE W/CLIP - NEG  . BREAST BIOPSY Left 07/2007   neg bx   . CESAREAN SECTION    . COLON SURGERY    . COLONOSCOPY WITH PROPOFOL N/A 02/18/2018   Procedure: COLONOSCOPY WITH PROPOFOL;  Surgeon: 02/20/2018, MD;  Location: [facility] ENDOSCOPY;  Service: Gastroenterology;  Laterality: N/A;  . COLONOSCOPY WITH PROPOFOL N/A 03/25/2018   Procedure: COLONOSCOPY WITH PROPOFOL;  Surgeon: 05/25/2018, MD;  Location: [facility] ENDOSCOPY;  Service: Gastroenterology;  Laterality: N/A;  . COLONOSCOPY WITH PROPOFOL N/A 08/25/2018   Procedure: COLONOSCOPY WITH PROPOFOL;  Surgeon: 10/25/2018, MD;  Location: [facility] ENDOSCOPY;  Service: Gastroenterology;  Laterality: N/A;  . LAPAROTOMY N/A 12/06/2017   Procedure: EXPLORATORY LAPAROTOMY, sigmoid colectomy, anastomosis;  Surgeon: 12/08/2017, MD;  Location:  [facility] ORS;  Service: General;  Laterality: N/A;  . TUBAL LIGATION    . XI ROBOTIC ASSISTED VENTRAL HERNIA N/A 11/26/2018   Procedure: XI ROBOTIC ASSISTED VENTRAL HERNIA;  Surgeon: Leafy Ro, MD;  Location: [facility] ORS;  Service: General;  Laterality: N/A;    Medications:  Current Outpatient Medications on File Prior to Visit  Medication Sig  . citalopram (CELEXA) 40 MG tablet Take 1 tablet (40 mg total) by mouth daily.  . cloNIDine (CATAPRES) 0.1 MG tablet Take 1 tablet (0.1 mg total) by mouth daily.  [name] gabapentin (NEURONTIN) 300 MG capsule Take 1 capsule (300 mg total) by mouth 3 (three) times daily as needed.  . hydrochlorothiazide (HYDRODIURIL) 25 MG tablet Take 1 tablet (25 mg total) by mouth daily.  . hydrOXYzine  (VISTARIL) 25 MG capsule Take 1 capsule (25 mg total) by mouth 3 (three) times daily as needed.  . pravastatin (PRAVACHOL) 40 MG tablet Take 1 tablet (40 mg total) by mouth daily.   No current facility-administered medications on file prior to visit.     Allergies:  No Known Allergies  Social History:  Social History   Socioeconomic History  . Marital status: Divorced    Spouse name: Not on file  . Number of children: Not on file  . Years of education: Not on file  . Highest education level: High school graduate  Occupational History  . Not on file  Social Needs  . Financial resource strain: Not hard at all  . Food insecurity    Worry: Never true    Inability: Never true  . Transportation needs    Medical: No    Non-medical: No  Tobacco Use  . Smoking status: Former Smoker    Types: Cigarettes    Quit date: 11/18/2012    Years since quitting: 6.0  . Smokeless tobacco: Never Used  . Tobacco comment: quit over 15 years ago   Substance and Sexual Activity  . Alcohol use: No  . Drug use: No  . Sexual activity: Not on file  Lifestyle  . Physical activity    Days per week: 2 days    Minutes per session: 30 min  . Stress: Not at all  Relationships  . Social connections    Talks on phone: More than three times a week    Gets together: More than three times a week    Attends religious service: More than 4 times per year    Active member of club or organization: No    Attends meetings of clubs or organizations: Never    Relationship status: Divorced  . Intimate partner violence    Fear of current or ex partner: No    Emotionally abused: No    Physically abused: No    Forced sexual activity: No  Other Topics Concern  . 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 Types: Cigarettes   . Quit date: 11/18/2012  . Years since quitting: 6.0  Smokeless Tobacco Never Used  Tobacco Comment   quit over 15 years ago    Social History   Substance and Sexual Activity  Alcohol Use No    Family History:  Family History  Problem Relation Age of Onset  .  Breast cancer Mother   . Liver cancer Sister   . Pancreatic cancer Sister   . Stomach cancer Brother   . Lung cancer Brother   . Brain cancer Sister   . Diabetes Father   . Heart disease Father   . COPD Neg Hx   . Stroke Neg Hx     Past medical history, surgical history, medications, allergies, family history and social history reviewed with patient today and changes made to appropriate areas of the chart.   Review of Systems - General ROS: negative Psychological ROS: negative Ophthalmic ROS: negative ENT ROS: negative Allergy and Immunology ROS: negative Hematological and Lymphatic ROS: negative Endocrine ROS: negative Breast ROS: negative for breast lumps Respiratory ROS: no cough, shortness of breath, or wheezing Cardiovascular ROS: no chest pain or dyspnea on exertion Gastrointestinal ROS: no abdominal pain, change in bowel habits, or black or bloody stools Genito-Urinary ROS: no dysuria, trouble voiding, or hematuria Musculoskeletal ROS: positive for - muscle pain Neurological ROS: no TIA or stroke symptoms Dermatological ROS: negative All other ROS negative except what is listed above and in the HPI.      Objective:    BP 128/81   Pulse 76   Temp 98.3 F (36.8 C) (Oral)   Ht 5\' 7"  (1.702 m)   Wt 179 lb (81.2 kg)   SpO2 94%   BMI 28.04 kg/m   Wt Readings from Last 3 Encounters:  12/11/18 179 lb (81.2 kg)  12/09/18 185 lb (83.9 kg)  11/19/18 170 lb (77.1 kg)    Physical Exam Vitals signs and nursing note reviewed.  Constitutional:      General: She is not in acute distress.    Appearance: She is well-developed.  HENT:     Head: Atraumatic.     Right Ear: External ear normal.     Left  Ear: External ear normal.     Nose: Nose normal.     Mouth/Throat:     Pharynx: No oropharyngeal exudate.  Eyes:     General: No scleral icterus.    Conjunctiva/sclera: Conjunctivae normal.     Pupils: Pupils are equal, round, and reactive to light.  Neck:     Musculoskeletal: Normal range of motion and neck supple.     Thyroid: No thyromegaly.  Cardiovascular:     Rate and Rhythm: Normal rate and regular rhythm.     Heart sounds: Normal heart sounds.  Pulmonary:     Effort: Pulmonary effort is normal. No respiratory distress.     Breath sounds: Normal breath sounds.  Chest:     Comments: Breast exam declined Abdominal:     General: Bowel sounds are normal.     Palpations: Abdomen is soft. There is no mass.     Tenderness: There is abdominal tenderness (limited palpation to light touch given recent surgery. ttp periumbilical area ).  Genitourinary:    Comments: GU exam declined Musculoskeletal: Normal range of motion.        General: Tenderness (right piriformis ttp) present.  Lymphadenopathy:     Cervical: No cervical adenopathy.  Skin:    General: Skin is warm and dry.     Findings: No rash.     Comments: Surgical incisions to abdomen healing well  Neurological:     Mental Status: She is alert and oriented to person, place, and time.     Cranial Nerves: No cranial nerve deficit.  Psychiatric:        Behavior: Behavior normal.  Results for orders placed or performed in visit on 12/11/18  CBC with Differential/Platelet out  Result Value Ref Range   WBC 6.7 3.4 - 10.8 x10E3/uL   RBC 4.95 3.77 - 5.28 x10E6/uL   Hemoglobin 14.6 11.1 - 15.9 g/dL   Hematocrit 16.1 09.6 - 46.6 %   MCV 86 79 - 97 fL   MCH 29.5 26.6 - 33.0 pg   MCHC 34.4 31.5 - 35.7 g/dL   RDW 04.5 40.9 - 81.1 %   Platelets 330 150 - 450 x10E3/uL   Neutrophils 55 Not Estab. %   Lymphs 31 Not Estab. %   Monocytes 8 Not Estab. %   Eos 5 Not Estab. %   Basos 1 Not Estab. %   Neutrophils Absolute 3.6  1.4 - 7.0 x10E3/uL   Lymphocytes Absolute 2.1 0.7 - 3.1 x10E3/uL   Monocytes Absolute 0.5 0.1 - 0.9 x10E3/uL   EOS (ABSOLUTE) 0.4 0.0 - 0.4 x10E3/uL   Basophils Absolute 0.1 0.0 - 0.2 x10E3/uL   Immature Granulocytes 0 Not Estab. %   Immature Grans (Abs) 0.0 0.0 - 0.1 x10E3/uL  Comprehensive metabolic panel  Result Value Ref Range   Glucose 92 65 - 99 mg/dL   BUN 16 8 - 27 mg/dL   Creatinine, Ser 9.14 0.57 - 1.00 mg/dL   GFR calc non Af Amer 73 >59 mL/min/1.73   GFR calc Af Amer 84 >59 mL/min/1.73   BUN/Creatinine Ratio 19 12 - 28   Sodium 140 134 - 144 mmol/L   Potassium 3.8 3.5 - 5.2 mmol/L   Chloride 101 96 - 106 mmol/L   CO2 23 20 - 29 mmol/L   Calcium 9.4 8.7 - 10.3 mg/dL   Total Protein 6.8 6.0 - 8.5 g/dL   Albumin 4.3 3.8 - 4.8 g/dL   Globulin, Total 2.5 1.5 - 4.5 g/dL   Albumin/Globulin Ratio 1.7 1.2 - 2.2   Bilirubin Total 0.4 0.0 - 1.2 mg/dL   Alkaline Phosphatase 70 39 - 117 IU/L   AST 20 0 - 40 IU/L   ALT 22 0 - 32 IU/L  Lipid Panel w/o Chol/HDL Ratio out  Result Value Ref Range   Cholesterol, Total 163 100 - 199 mg/dL   Triglycerides 782 (H) 0 - 149 mg/dL   HDL 36 (L) >95 mg/dL   VLDL Cholesterol Cal 34 5 - 40 mg/dL   LDL Chol Calc (NIH) 93 0 - 99 mg/dL  UA/M w/rflx Culture, Routine   Specimen: Urine   URINE  Result Value Ref Range   Specific Gravity, UA 1.010 1.005 - 1.030   pH, UA 5.0 5.0 - 7.5   Color, UA Yellow Yellow   Appearance Ur Clear Clear   Leukocytes,UA Negative Negative   Protein,UA Negative Negative/Trace   Glucose, UA Negative Negative   Ketones, UA Negative Negative   RBC, UA Negative Negative   Bilirubin, UA Negative Negative   Urobilinogen, Ur 0.2 0.2 - 1.0 mg/dL   Nitrite, UA Negative Negative  TSH  Result Value Ref Range   TSH 1.290 0.450 - 4.500 uIU/mL  HgB A1c  Result Value Ref Range   Hgb A1c MFr Bld 6.5 (H) 4.8 - 5.6 %   Est. average glucose Bld gHb Est-mCnc 140 mg/dL      Assessment & Plan:   Problem List Items  Addressed This Visit      Cardiovascular and Mediastinum   Hypertension    BP stable and under good control, continue current regimen  Relevant Orders   CBC with Differential/Platelet out (Completed)   Comprehensive metabolic panel (Completed)   UA/M w/rflx Culture, Routine (Completed)   TSH (Completed)     Endocrine   IFG (impaired fasting glucose)    Recheck a1C, continue working on lifestyle modifications for control      Relevant Orders   HgB A1c (Completed)     Other   Hyperlipidemia - Primary    Recheck lipids, adjust as needed. Continue current regimen      Relevant Orders   Lipid Panel w/o Chol/HDL Ratio out (Completed)   Anxiety    Stable and under good control, continue current regimen      Insomnia    Well controlled with prn belsomra, continue current regimen       Other Visit Diagnoses    Annual physical exam       Strain of abdominal muscle, subsequent encounter       Not improving with conservative methods of ice/heat, rest. Trial flexeril prn in addition to OTC pain relievers.    Piriformis syndrome of right side       Stretches given, heat, flexeril, rest. F/u if not resolving   Relevant Medications   cyclobenzaprine (FLEXERIL) 5 MG tablet   Suvorexant (BELSOMRA) 10 MG TABS       Follow up plan: Return in about 6 months (around 06/10/2019) for 6 month f/u.   LABORATORY TESTING:  - Pap smear: not applicable  IMMUNIZATIONS:   - Tdap: Tetanus vaccination status reviewed: last tetanus booster within 10 years. - Influenza: Up to date - Pneumovax: Up to date - Prevnar: Up to date  SCREENING: -Mammogram: Up to date  - Colonoscopy: Up to date  - Bone Density: Up to date   PATIENT COUNSELING:   Advised to take 1 mg of folate supplement per day if capable of pregnancy.   Sexuality: Discussed sexually transmitted diseases, partner selection, use of condoms, avoidance of unintended pregnancy  and contraceptive alternatives.   Advised to  avoid cigarette smoking.  I discussed with the patient that most people either abstain from alcohol or drink within safe limits (<=14/week and <=4 drinks/occasion for males, <=7/weeks and <= 3 drinks/occasion for females) and that the risk for alcohol disorders and other health effects rises proportionally with the number of drinks per week and how often a drinker exceeds daily limits.  Discussed cessation/primary prevention of drug use and availability of treatment for abuse.   Diet: Encouraged to adjust caloric intake to maintain  or achieve ideal body weight, to reduce intake of dietary saturated fat and total fat, to limit sodium intake by avoiding high sodium foods and not adding table salt, and to maintain adequate dietary potassium and calcium preferably from fresh fruits, vegetables, and low-fat dairy products.    stressed the importance of regular exercise  Injury prevention: Discussed safety belts, safety helmets, smoke detector, smoking near bedding or upholstery.   Dental health: Discussed importance of regular tooth brushing, flossing, and dental visits.    NEXT PREVENTATIVE PHYSICAL DUE IN 1 YEAR. Return in about 6 months (around 06/10/2019) for 6 month f/u.

## 2018-12-11 NOTE — Progress Notes (Signed)
Outpatient Surgical Follow Up  12/11/2018  Marcia [name] is an 68 y.o. female.   No chief complaint on file.   HPI: s/p rob ventral hernia, doing well, taking po still sore on the left UP from larger incisions. No fevers or chills  Past Medical History:  Diagnosis Date  . Anxiety   . Depression   . Hyperlipidemia   . Hypertension   . Perforated bowel ([place]) 12/06/2017    Past Surgical History:  Procedure Laterality Date  . BREAST BIOPSY Left 2015   CORE W/CLIP - NEG  . BREAST BIOPSY Left 07/2007   neg bx   . CESAREAN SECTION    . COLON SURGERY    . COLONOSCOPY WITH PROPOFOL N/A 02/18/2018   Procedure: COLONOSCOPY WITH PROPOFOL;  Surgeon: Jonathon Bellows, MD;  Location: [facility] ENDOSCOPY;  Service: Gastroenterology;  Laterality: N/A;  . COLONOSCOPY WITH PROPOFOL N/A 03/25/2018   Procedure: COLONOSCOPY WITH PROPOFOL;  Surgeon: Jonathon Bellows, MD;  Location: [facility] ENDOSCOPY;  Service: Gastroenterology;  Laterality: N/A;  . COLONOSCOPY WITH PROPOFOL N/A 08/25/2018   Procedure: COLONOSCOPY WITH PROPOFOL;  Surgeon: Lin Landsman, MD;  Location: [facility] ENDOSCOPY;  Service: Gastroenterology;  Laterality: N/A;  . LAPAROTOMY N/A 12/06/2017   Procedure: EXPLORATORY LAPAROTOMY, sigmoid colectomy, anastomosis;  Surgeon: Vickie Epley, MD;  Location: [facility] ORS;  Service: General;  Laterality: N/A;  . TUBAL LIGATION    . XI ROBOTIC ASSISTED VENTRAL HERNIA N/A 11/26/2018   Procedure: XI ROBOTIC ASSISTED VENTRAL HERNIA;  Surgeon: Jules Husbands, MD;  Location: [facility] ORS;  Service: General;  Laterality: N/A;    Family History  Problem Relation Age of Onset  . Breast cancer Mother   . Liver cancer Sister   . Pancreatic cancer Sister   . Stomach cancer Brother   . Lung cancer Brother   . Brain cancer Sister   . Diabetes Father   . Heart disease Father   . COPD Neg Hx   . Stroke Neg Hx     Social History:  reports that she quit smoking about 6 years ago. Her smoking use included cigarettes.  She has never used smokeless tobacco. She reports that she does not drink alcohol or use drugs.  Allergies: No Known Allergies  Medications reviewed.    ROS Full ROS performed and is otherwise negative other than what is stated in HPI   There were no vitals taken for this visit.  Physical Exam NAD, alert Abd: soft, mild ttp LUQ 12 mm port, some induration, no peritonitis, no hernia recurrence no infection   Assessment/Plan:  1. Ventral hernia without obstruction or gangrene, she still having some pain refill norco x 1 only May do virtual visit in 2 weeks   Caroleen Hamman, MD [place] Surgeon

## 2018-12-11 NOTE — Progress Notes (Signed)
S/p rob ventral hernia Taking PO, no fevers Some pain on  The left side  PE: NAD VUY:EBXI, TTP appropriate LUQ where largest incision is. No cellulitis some induration. No infection, no peritonitis, no recurrence  A/P Doing well She is requesting another refill norco, we will provide only one last refill No complications No heavy lifting

## 2018-12-12 LAB — COMPREHENSIVE METABOLIC PANEL
ALT: 22 IU/L (ref 0–32)
AST: 20 IU/L (ref 0–40)
Albumin/Globulin Ratio: 1.7 (ref 1.2–2.2)
Albumin: 4.3 g/dL (ref 3.8–4.8)
Alkaline Phosphatase: 70 IU/L (ref 39–117)
BUN/Creatinine Ratio: 19 (ref 12–28)
BUN: 16 mg/dL (ref 8–27)
Bilirubin Total: 0.4 mg/dL (ref 0.0–1.2)
CO2: 23 mmol/L (ref 20–29)
Calcium: 9.4 mg/dL (ref 8.7–10.3)
Chloride: 101 mmol/L (ref 96–106)
Creatinine, Ser: 0.83 mg/dL (ref 0.57–1.00)
GFR calc Af Amer: 84 mL/min/{1.73_m2} (ref >59)
GFR calc non Af Amer: 73 mL/min/{1.73_m2} (ref >59)
Globulin, Total: 2.5 g/dL (ref 1.5–4.5)
Glucose: 92 mg/dL (ref 65–99)
Potassium: 3.8 mmol/L (ref 3.5–5.2)
Sodium: 140 mmol/L (ref 134–144)
Total Protein: 6.8 g/dL (ref 6.0–8.5)

## 2018-12-12 LAB — CBC WITH DIFFERENTIAL/PLATELET
Basophils Absolute: 0.1 10*3/uL (ref 0.0–0.2)
Basos: 1 %
EOS (ABSOLUTE): 0.4 10*3/uL (ref 0.0–0.4)
Eos: 5 %
Hematocrit: 42.5 % (ref 34.0–46.6)
Hemoglobin: 14.6 g/dL (ref 11.1–15.9)
Immature Grans (Abs): 0 10*3/uL (ref 0.0–0.1)
Immature Granulocytes: 0 %
Lymphocytes Absolute: 2.1 10*3/uL (ref 0.7–3.1)
Lymphs: 31 %
MCH: 29.5 pg (ref 26.6–33.0)
MCHC: 34.4 g/dL (ref 31.5–35.7)
MCV: 86 fL (ref 79–97)
Monocytes Absolute: 0.5 10*3/uL (ref 0.1–0.9)
Monocytes: 8 %
Neutrophils Absolute: 3.6 10*3/uL (ref 1.4–7.0)
Neutrophils: 55 %
Platelets: 330 10*3/uL (ref 150–450)
RBC: 4.95 x10E6/uL (ref 3.77–5.28)
RDW: 12.9 % (ref 11.7–15.4)
WBC: 6.7 10*3/uL (ref 3.4–10.8)

## 2018-12-12 LAB — UA/M W/RFLX CULTURE, ROUTINE
Bilirubin, UA: NEGATIVE
Glucose, UA: NEGATIVE
Ketones, UA: NEGATIVE
Leukocytes,UA: NEGATIVE
Nitrite, UA: NEGATIVE
Protein,UA: NEGATIVE
RBC, UA: NEGATIVE
Specific Gravity, UA: 1.01 (ref 1.005–1.030)
Urobilinogen, Ur: 0.2 mg/dL (ref 0.2–1.0)
pH, UA: 5 (ref 5.0–7.5)

## 2018-12-12 LAB — LIPID PANEL W/O CHOL/HDL RATIO
Cholesterol, Total: 163 mg/dL (ref 100–199)
HDL: 36 mg/dL — ABNORMAL LOW (ref >39)
LDL Chol Calc (NIH): 93 mg/dL (ref 0–99)
Triglycerides: 195 mg/dL — ABNORMAL HIGH (ref 0–149)
VLDL Cholesterol Cal: 34 mg/dL (ref 5–40)

## 2018-12-12 LAB — HEMOGLOBIN A1C
Est. average glucose Bld gHb Est-mCnc: 140 mg/dL
Hgb A1c MFr Bld: 6.5 % — ABNORMAL HIGH (ref 4.8–5.6)

## 2018-12-12 LAB — TSH: TSH: 1.29 u[IU]/mL (ref 0.450–4.500)

## 2018-12-14 ENCOUNTER — Ambulatory Visit: Payer: Self-pay

## 2018-12-14 NOTE — Telephone Encounter (Signed)
Dr.Pabon sent over Norco/Vicodin 5-325 MG prescription at 2:43p 12/11/18 to patient's pharmacy.

## 2018-12-14 NOTE — Patient Instructions (Signed)
Thank you allowing the Chronic Care Management Team to be a part of your care! It was a pleasure speaking with you today!  CCM (Chronic Care Management) Team   Harsimran Westman RN, BSN Nurse Care Coordinator  [phone]  [facility] PharmD  Clinical Pharmacist  [phone]  Eula Fried LCSW Clinical Social Worker [phone]  Goals Addressed            This Visit's Progress   . I want to keep my bones healthy (pt-stated)       Current Barriers:  . Chronic Disease Management support and education needs related to osteopenia  Nurse Case Manager Clinical Goal(s):  [name] Over the next 90 days, patient will demonstrate improved health management independence as evidenced by self-reported increased exercise, addition of high calcium foods and a vitamin D supplement  Interventions:  . Evaluation of current treatment plan related to Hypertension  and patient's adherence to plan as established by provider. . Advised patient to Exercise at least 3 times a week including lite weight training in her routine once she has been cleared by surgeon.  . Discussed plans with patient for ongoing care management follow up and provided patient with direct contact information for care management team . Patient checking her blood pressure at home and reports good control.  . Patient noted to have a rash to left lower leg, advised to talk to PCP about rash, Right hip pain and abdominal muscle pain.   Patient Self Care Activities:  . Attends church or other social activities . Performs ADL's independently . Performs IADL's independently  Initial goal documentation        The patient verbalized understanding of instructions provided today and declined a print copy of patient instruction materials.   The patient has been provided with contact information for the care management team and has been advised to call with any health related questions or concerns.

## 2018-12-14 NOTE — Telephone Encounter (Signed)
Related lab results of 11/23 to Patient.   per Merrie Roof .  Patient States that she would rather take the low dose of Metformin.

## 2018-12-15 ENCOUNTER — Telehealth: Payer: Self-pay | Admitting: *Deleted

## 2018-12-15 MED ORDER — METFORMIN HCL 500 MG PO TABS: 500.0000 mg | ORAL_TABLET | Freq: Two times a day (BID) | ORAL | 2 refills | Status: DC

## 2018-12-15 NOTE — Telephone Encounter (Signed)
Pt called to check status on metformin

## 2018-12-15 NOTE — Telephone Encounter (Signed)
Routing to provider  

## 2018-12-15 NOTE — Telephone Encounter (Signed)
Reviewed lab results and physician's note with patient. Particularly, her A1c numbers. Discussed diet and exercise to help lower her A1c.She would prefer to start on the metformin. (see lab results note from 12/11/18). Routing to PCP for prescription order for Metformin. [employer], Phillip Heal.

## 2018-12-15 NOTE — Telephone Encounter (Signed)
Metformin sent, message relayed in another telephone encounter

## 2018-12-15 NOTE — Addendum Note (Signed)
Addended by: Merrie Roof E on: 12/15/2018 10:53 AM   Modules accepted: Orders

## 2018-12-15 NOTE — Telephone Encounter (Signed)
Rx sent in, needs recheck OV in 3 months

## 2018-12-20 NOTE — Assessment & Plan Note (Signed)
Stable and under good control, continue current regimen 

## 2018-12-20 NOTE — Assessment & Plan Note (Signed)
Well controlled with prn belsomra, continue current regimen

## 2018-12-20 NOTE — Assessment & Plan Note (Signed)
BP stable and under good control, continue current regimen 

## 2018-12-20 NOTE — Assessment & Plan Note (Signed)
Recheck a1C, continue working on lifestyle modifications for control

## 2018-12-20 NOTE — Assessment & Plan Note (Signed)
Recheck lipids, adjust as needed. Continue current regimen 

## 2019-01-11 ENCOUNTER — Other Ambulatory Visit: Payer: Self-pay | Admitting: Family Medicine

## 2019-01-18 ENCOUNTER — Other Ambulatory Visit: Payer: Self-pay | Admitting: Family Medicine

## 2019-01-18 ENCOUNTER — Telehealth: Payer: Self-pay | Admitting: Family Medicine

## 2019-01-18 DIAGNOSIS — M5431 Sciatica, right side: Secondary | ICD-10-CM

## 2019-01-18 NOTE — Telephone Encounter (Signed)
Pt saw rachel on 12-11-2018 and she mention the top of her legs to buttock is hurting her at night and per pt rachel told her if it continue she would call something in. [facility] in [place]

## 2019-01-19 NOTE — Telephone Encounter (Signed)
Patient notified, please place PT order.

## 2019-01-19 NOTE — Telephone Encounter (Signed)
I think that Physical Therapy may be a good next step if the medications we've tried and home stretches are not helping. Another option would be getting her in with Orthopedics

## 2019-01-19 NOTE — Telephone Encounter (Signed)
Called patient, no answer, left a message asking patient to return my call.   

## 2019-01-20 NOTE — Telephone Encounter (Signed)
Referral placed.

## 2019-01-21 ENCOUNTER — Telehealth: Payer: Self-pay

## 2019-01-21 NOTE — Telephone Encounter (Signed)
Copied from [place] [id number]. Topic: Referral - Status >> Jan 21, 2019  8:34 AM Scherrie Gerlach wrote: Reason for CRM: pt wants Apolonio Schneiders to know she has declined the PT referral.  She stays with a 68 yr old lady and has no one else that can care for her.  But wants to thank you for suggesting that anyway

## 2019-01-27 ENCOUNTER — Telehealth: Payer: Self-pay

## 2019-03-10 ENCOUNTER — Ambulatory Visit (INDEPENDENT_AMBULATORY_CARE_PROVIDER_SITE_OTHER): Payer: Medicare Other | Admitting: General Practice

## 2019-03-10 ENCOUNTER — Telehealth: Payer: Self-pay | Admitting: General Practice

## 2019-03-10 DIAGNOSIS — M8580 Other specified disorders of bone density and structure, unspecified site: Secondary | ICD-10-CM

## 2019-03-10 DIAGNOSIS — I10 Essential (primary) hypertension: Secondary | ICD-10-CM | POA: Diagnosis not present

## 2019-03-10 DIAGNOSIS — R7301 Impaired fasting glucose: Secondary | ICD-10-CM

## 2019-03-10 DIAGNOSIS — E782 Mixed hyperlipidemia: Secondary | ICD-10-CM

## 2019-03-10 DIAGNOSIS — F419 Anxiety disorder, unspecified: Secondary | ICD-10-CM

## 2019-03-10 NOTE — Chronic Care Management (AMB) (Signed)
Chronic Care Management   Follow Up Note   03/10/2019 Name: Marcia [name] MRN: [id number] DOB: [birth year]  Referred by: Particia Nearing, PA-C Reason for referral : Chronic Care Management (HTN/HLD/Osteopenia/Anxiety)   Marcia [name] is a 69 y.o. year old female who is a primary care patient of Particia Nearing, [place]. The CCM team was consulted for assistance with chronic disease management and care coordination needs.    Review of patient status, including review of consultants reports, relevant laboratory and other test results, and collaboration with appropriate care team members and the patient's provider was performed as part of comprehensive patient evaluation and provision of chronic care management services.    SDOH (Social Determinants of Health) screening performed today: Stress Physical Activity. See Care Plan for related entries.   Outpatient Encounter Medications as of 03/10/2019  Medication Sig   metFORMIN (GLUCOPHAGE) 500 MG tablet Take 1 tablet (500 mg total) by mouth 2 (two) times daily with a meal.   citalopram (CELEXA) 40 MG tablet Take 1 tablet (40 mg total) by mouth daily.   cloNIDine (CATAPRES) 0.1 MG tablet Take 1 tablet (0.1 mg total) by mouth daily.   cyclobenzaprine (FLEXERIL) 5 MG tablet Take 1 tablet (5 mg total) by mouth 3 (three) times daily as needed for muscle spasms.   gabapentin (NEURONTIN) 300 MG capsule Take 1 capsule (300 mg total) by mouth 3 (three) times daily as needed.   hydrochlorothiazide (HYDRODIURIL) 25 MG tablet Take 1 tablet (25 mg total) by mouth daily.   HYDROcodone-acetaminophen (NORCO/VICODIN) 5-325 MG tablet Take 1-2 tablets by mouth every 4 (four) hours as needed for moderate pain.   hydrOXYzine (VISTARIL) 25 MG capsule Take 1 capsule (25 mg total) by mouth 3 (three) times daily as needed.   pravastatin (PRAVACHOL) 40 MG tablet TAKE 1 TABLET DAILY.   Suvorexant (BELSOMRA) 10 MG TABS Take 10 mg by mouth at  bedtime.   No facility-administered encounter medications on file as of 03/10/2019.     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"       Current Barriers:   Chronic Disease Management support, education, and care coordination needs related to HTN, HLD, DMII, and Anxiety  Clinical Goal(s) related to HTN, HLD, DMII, and Anxiety:  Over the next 90 days, patient will:   Work with the care management team to address educational, disease management, and care coordination needs   Begin or continue self health monitoring activities as directed today Measure and record cbg (blood glucose) BID times daily and Measure and record blood pressure 5 times per week  Call provider office for new or worsened signs and symptoms Blood glucose findings outside established parameters, Blood pressure findings outside established parameters, and New or worsened symptom related to anxiety   Call care management team with questions or concerns  Verbalize basic understanding of patient centered plan of care established today  Interventions related to HTN, HLD, DMII, and Anxiety:   Evaluation of current treatment plans and patient's adherence to plan as established by provider  Assessed patient understanding of disease states  Assessed patient's education and care coordination needs  Provided disease specific education to patient   Collaborated with appropriate clinical care team members regarding patient needs  Patient Self Care Activities related to HTN, HLD, DMII, and  Anxiety:   Patient is unable to independently self-manage chronic health conditions  Initial goal documentation      RNCM: PT-I want to keep my bones healthy (pt-stated)       Current Barriers:   Chronic Disease Management support and education needs related to osteopenia  Nurse Case Manager Clinical Goal(s):    Over the next 90 days, patient will demonstrate improved health management independence as evidenced by self-reported increased exercise, addition of high calcium foods and a vitamin D supplement  Interventions:   Evaluation of current treatment plan related to Hypertension  and patient's adherence to plan as established by provider.  Advised patient to Exercise at least 3 times a week including lite weight training in her routine once she has been cleared by surgeon.   Discussed plans with patient for ongoing care management follow up and provided patient with direct contact information for care management team  Evaluation of Patient checking her blood pressure at home and reports good control.   Evaluation of patients compliance with medication regimen  Patient Self Care Activities:   Attends church or other social activities  Performs ADL's independently  Performs IADL's independently  Please see past updates related to this goal by clicking on the "Past Updates" button in the selected goal       RNCM: Pt: "Do you know when I can get the COVID shot at the office?" (pt-stated)       Current Barriers:   Knowledge Deficits related to COVID-19 and impact on patient self health management  Clinical Goal(s):   Over the next 30 days, patient will verbalize basic understanding of COVID-19 impact on individual health and self health management as evidenced by verbalization of basic understanding of COVID-19 as a viral disease, measures to prevent exposure, signs and symptoms, when to contact provider  Interventions:  Evaluation of current treatment plan related to [place]  and patient's adherence to plan as established by provider.  Provided education to patient re: COVID19 vaccination available in the county and assisted with adding the patient to the waitlist for a call when an appointment for vaccination is available   Provided patient and/or caregiver with COVID19   information about places in [place] vaccine.  The patient has been added to the waitlist  Advice worker).  Patient Self Care Activities:   Patient verbalizes understanding of plan to be contacted once the vaccine becomes available and an appointment can be made. Patient is currently on the waitlist  Attends all scheduled provider appointments  Calls provider office for new concerns or questions  Initial goal documentation                       Plan:   The care management team will reach out to the patient again over the next 60 days.    Noreene Larsson RN, MSN, [facility] Mobile: [phone]

## 2019-03-10 NOTE — Patient Instructions (Signed)
Visit Information  Goals Addressed            This Visit's Progress   . 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 Chronic Disease Management support, education, and care coordination needs related to HTN, HLD, DMII, and Anxiety  Clinical Goal(s) related to HTN, HLD, DMII, and Anxiety:  Over the next 90 days, patient will:  . Work with the care management team to address educational, disease management, and care coordination needs  . Begin or continue self health monitoring activities as directed today Measure and record cbg (blood glucose) BID times daily and Measure and record blood pressure 5 times per week . Call provider office for new or worsened signs and symptoms Blood glucose findings outside established parameters, Blood pressure findings outside established parameters, and New or worsened symptom related to anxiety  . Call care management team with questions or concerns . Verbalize basic understanding of patient centered plan of care established today  Interventions related to HTN, HLD, DMII, and Anxiety:  . Evaluation of current treatment plans and patient's adherence to plan as established by provider . Assessed patient understanding of disease states . Assessed patient's education and care coordination needs . Provided disease specific education to patient  . Collaborated with appropriate clinical care team members regarding patient needs  Patient Self Care Activities related to HTN, HLD, DMII, and Anxiety:  . Patient is unable to independently self-manage chronic health conditions  Initial goal documentation     . RNCM: PT-I want to keep my bones healthy (pt-stated)       Current Barriers:  . Chronic Disease Management support and education needs related to osteopenia  Nurse Case Manager Clinical Goal(s):  [name] Over the next 90 days, patient will demonstrate improved health management  independence as evidenced by self-reported increased exercise, addition of high calcium foods and a vitamin D supplement  Interventions:  . Evaluation of current treatment plan related to Hypertension  and patient's adherence to plan as established by provider. . Advised patient to Exercise at least 3 times a week including lite weight training in her routine once she has been cleared by surgeon.  . Discussed plans with patient for ongoing care management follow up and provided patient with direct contact information for care management team . Evaluation of Patient checking her blood pressure at home and reports good control.  . Evaluation of patients compliance with medication regimen  Patient Self Care Activities:  . Attends church or other social activities . Performs ADL's independently . Performs IADL's independently  Please see past updates related to this goal by clicking on the "Past Updates" button in the selected goal      . RNCM: Pt: "Do you know when I can get the COVID shot at the office?" (pt-stated)       Current Barriers:  [name] Knowledge Deficits related to COVID-19 and impact on patient self health management  Clinical Goal(s):  [name] Over the next 30 days, patient will verbalize basic understanding of COVID-19 impact on individual health and self health management as evidenced by verbalization of basic understanding of COVID-19 as a viral disease, measures to prevent exposure, signs and symptoms, when to contact provider  Interventions: . Evaluation of current treatment plan related to [place]  and patient's adherence to plan as established by provider. . Provided education to patient re: COVID19 vaccination available in  the county and assisted with adding the patient to the waitlist for a call when an appointment for vaccination is available  . Provided patient and/or caregiver with COVID19  information about places in [facility] offering the COVID19 vaccine.  The patient  has been added to the waitlist  Nurse, learning disability).  Patient Self Care Activities:  . Patient verbalizes understanding of plan to be contacted once the vaccine becomes available and an appointment can be made. Patient is currently on the waitlist . Attends all scheduled provider appointments . Calls provider office for new concerns or questions  Initial goal documentation                      Patient verbalizes understanding of instructions provided today.   The care management team will reach out to the patient again over the next 60 days.   Alto Denver RN, MSN, CCM Community Care Coordinator [place]  [facility] Mobile: [phone]

## 2019-03-17 ENCOUNTER — Other Ambulatory Visit: Payer: Self-pay | Admitting: Family Medicine

## 2019-03-17 NOTE — Telephone Encounter (Signed)
Approved per protocol.  Requested Prescriptions  Pending Prescriptions Disp Refills  . hydrOXYzine (VISTARIL) 25 MG capsule [Pharmacy Med Name: HYDROXYZINE PAM 25 MG CAP] 90 capsule 0    Sig: Take 1 capsule (25 mg total) by mouth 3 (three) times daily as needed.     Ear, Nose, and Throat:  Antihistamines Passed - 03/17/2019  9:55 AM      Passed - Valid encounter within last 12 months    Recent Outpatient Visits          3 months ago Mixed hyperlipidemia   [facility] Roosvelt Maser [place], [place]   8 months ago Rash   [facility] Roosvelt Maser [place], [place]   9 months ago Essential hypertension   [facility] Roosvelt Maser [place], [place]   1 year ago Right sided sciatica   [facility] Particia Nearing, [place]   1 year ago Essential hypertension   [facility] Particia Nearing, [place]      Future Appointments            In 2 weeks [facility], [facility]

## 2019-03-18 ENCOUNTER — Other Ambulatory Visit: Payer: Self-pay

## 2019-03-18 NOTE — Patient Outreach (Signed)
[facility]) Care Management  03/18/2019  Marcia [name] 1950/05/08 [id number]   Medication Adherence call to Mrs. Marcia [name] HIPPA Compliant Voice message left with a call back number. Mrs. Marcia [name] is showing past due on Metformin 500 mg under [facility] Ins.   Marcia [name] CPhT Pharmacy Technician [facility] Care Management Direct Dial [phone]  Fax [phone] [name]

## 2019-03-22 ENCOUNTER — Other Ambulatory Visit: Payer: Self-pay | Admitting: Family Medicine

## 2019-03-24 ENCOUNTER — Other Ambulatory Visit: Payer: Self-pay | Admitting: Family Medicine

## 2019-04-02 ENCOUNTER — Ambulatory Visit: Payer: Medicare Other | Attending: Internal Medicine

## 2019-04-02 DIAGNOSIS — Z23 Encounter for immunization: Secondary | ICD-10-CM

## 2019-04-02 NOTE — Progress Notes (Signed)
   Covid-19 Vaccination Clinic  Name:  Marcia [name]    MRN: [id number] DOB: 1950/07/05  04/02/2019  [name] was observed post Covid-19 immunization for 15 minutes without incident. She was provided with Vaccine Information Sheet and instruction to access the V-Safe system.   [name] was instructed to call 911 with any severe reactions post vaccine: [name] Difficulty breathing  . Swelling of face and throat  . A fast heartbeat  . A bad rash all over body  . Dizziness and weakness   Immunizations Administered    Name Date Dose VIS Date Route   Pfizer COVID-19 Vaccine 04/02/2019 11:42 AM 0.3 mL 01/01/2019 Intramuscular   Manufacturer: [employer], [employer]   Lot: [id number]   NDC: [address]-3

## 2019-04-04 ENCOUNTER — Other Ambulatory Visit: Payer: Self-pay | Admitting: Family Medicine

## 2019-04-04 NOTE — Telephone Encounter (Signed)
Requested Prescriptions  Pending Prescriptions Disp Refills  . gabapentin (NEURONTIN) 300 MG capsule [Pharmacy Med Name: GABAPENTIN 300 MG CAPSULE] 270 capsule 0    Sig: Take 1 capsule (300 mg total) by mouth 3 (three) times daily as needed.     Neurology: Anticonvulsants - gabapentin Passed - 04/04/2019  1:05 PM      Passed - Valid encounter within last 12 months    Recent Outpatient Visits          3 months ago Mixed hyperlipidemia   [facility] Roosvelt Maser [place], [place]   9 months ago Rash   [facility] Roosvelt Maser [place], [place]   10 months ago Essential hypertension   [facility] Roosvelt Maser [place], [place]   1 year ago Right sided sciatica   [facility] Particia Nearing, [place]   1 year ago Essential hypertension   [facility], Salley Hews, [place]      Future Appointments            Tomorrow [facility], [facility]

## 2019-04-05 ENCOUNTER — Ambulatory Visit: Payer: Medicare Other

## 2019-04-19 ENCOUNTER — Other Ambulatory Visit: Payer: Self-pay | Admitting: Family Medicine

## 2019-04-27 ENCOUNTER — Ambulatory Visit: Payer: Medicare Other | Attending: Internal Medicine

## 2019-04-27 DIAGNOSIS — Z23 Encounter for immunization: Secondary | ICD-10-CM

## 2019-04-27 NOTE — Progress Notes (Signed)
   Covid-19 Vaccination Clinic  Name:  Marcia [name]    MRN: [id number] DOB: 1950-10-18  04/27/2019  [name] was observed post Covid-19 immunization for 15 minutes without incident. She was provided with Vaccine Information Sheet and instruction to access the V-Safe system.   [name] was instructed to call 911 with any severe reactions post vaccine: [name] Difficulty breathing  . Swelling of face and throat  . A fast heartbeat  . A bad rash all over body  . Dizziness and weakness   Immunizations Administered    Name Date Dose VIS Date Route   Pfizer COVID-19 Vaccine 04/27/2019  3:19 PM 0.3 mL 01/01/2019 Intramuscular   Manufacturer: [employer], [employer]   Lot: [id number]   NDC: [address]-4

## 2019-04-28 ENCOUNTER — Ambulatory Visit (INDEPENDENT_AMBULATORY_CARE_PROVIDER_SITE_OTHER): Payer: Medicare Other

## 2019-04-28 VITALS — Ht 67.0 in | Wt 160.0 lb

## 2019-04-28 DIAGNOSIS — Z0000 Encounter for general adult medical examination without abnormal findings: Secondary | ICD-10-CM

## 2019-04-28 NOTE — Progress Notes (Signed)
Subjective:   Marcia [name] is a 69 y.o. female who presents for Medicare Annual (Subsequent) preventive examination.  This visit is being conducted via phone call  - after an attmept to do on video chat - due to the COVID-19 pandemic. This patient has given me verbal consent via phone to conduct this visit, patient states they are participating from their home address. Some vital signs may be absent or patient reported.   Patient identification: identified by name, DOB, and current address.    Review of Systems:   Cardiac Risk Factors include: advanced age (>38men, >5 women);hypertension;diabetes mellitus;dyslipidemia     Objective:     Vitals: Ht 5\' 7"  (1.702 m)   Wt 160 lb (72.6 kg)   BMI 25.06 kg/m   Body mass index is 25.06 kg/m.  Advanced Directives 04/28/2019 11/19/2018 08/25/2018 04/01/2018 03/25/2018 12/06/2017 12/06/2017  Does Patient Have a Medical Advance Directive? No No No No No No No  Does patient want to make changes to medical advance directive? Yes (MAU/Ambulatory/Procedural Areas - Information given) - - - - - -  Would patient like information on creating a medical advance directive? - - No - Patient declined No - Patient declined - No - Patient declined No - Patient declined    Tobacco Social History   Tobacco Use  Smoking Status Former Smoker  . Types: Cigarettes  . Quit date: 11/18/2012  . Years since quitting: 6.4  Smokeless Tobacco Never Used  Tobacco Comment   quit over 15 years ago      Counseling given: Not Answered Comment: quit over 15 years ago    Clinical Intake:  Pre-visit preparation completed: Yes  Pain : No/denies pain     Nutritional Risks: None Diabetes: Yes CBG done?: No Did pt. bring in CBG monitor from home?: No  How often do you need to have someone help you when you read instructions, pamphlets, or other written materials from your doctor or pharmacy?: 1 - Never  Interpreter Needed?: No  Information entered by ::  Tiffany Hill,LPN  Past Medical History:  Diagnosis Date  . Anxiety   . Depression   . Hyperlipidemia   . Hypertension   . Perforated bowel (HCC) 12/06/2017   Past Surgical History:  Procedure Laterality Date  . BREAST BIOPSY Left 2015   CORE W/CLIP - NEG  . BREAST BIOPSY Left 07/2007   neg bx   . CESAREAN SECTION    . COLON SURGERY    . COLONOSCOPY WITH PROPOFOL N/A 02/18/2018   Procedure: COLONOSCOPY WITH PROPOFOL;  Surgeon: 02/20/2018, MD;  Location: [facility] ENDOSCOPY;  Service: Gastroenterology;  Laterality: N/A;  . COLONOSCOPY WITH PROPOFOL N/A 03/25/2018   Procedure: COLONOSCOPY WITH PROPOFOL;  Surgeon: 05/25/2018, MD;  Location[facility] ENDOSCOPY;  Service: Gastroenterology;  Laterality: N/A;  . COLONOSCOPY WITH PROPOFOL N/A 08/25/2018   Procedure: COLONOSCOPY WITH PROPOFOL;  Surgeon: 10/25/2018, MD;  Location: [facility] ENDOSCOPY;  Service: Gastroenterology;  Laterality: N/A;  . LAPAROTOMY N/A 12/06/2017   Procedure: EXPLORATORY LAPAROTOMY, sigmoid colectomy, anastomosis;  Surgeon: 12/08/2017, MD;  Location: [facility] ORS;  Service: General;  Laterality: N/A;  . TUBAL LIGATION    . XI ROBOTIC ASSISTED VENTRAL HERNIA N/A 11/26/2018   Procedure: XI ROBOTIC ASSISTED VENTRAL HERNIA;  Surgeon: 13/05/2018, MD;  Location: [facility] ORS;  Service: General;  Laterality: N/A;   Family History  Problem Relation Age of Onset  . Breast cancer Mother   . Liver cancer  Sister   . Pancreatic cancer Sister   . Stomach cancer Brother   . Lung cancer Brother   . Brain cancer Sister   . Diabetes Father   . Heart disease Father   . COPD Neg Hx   . Stroke Neg Hx    Social History   Socioeconomic History  . Marital status: Divorced    Spouse name: Not on file  . Number of children: Not on file  . Years of education: Not on file  . Highest education level: High school graduate  Occupational History  . Not on file  Tobacco Use  . Smoking status: Former Smoker    Types: Cigarettes     Quit date: 11/18/2012    Years since quitting: 6.4  . Smokeless tobacco: Never Used  . Tobacco comment: quit over 15 years ago   Substance and Sexual Activity  . Alcohol use: No  . Drug use: No  . Sexual activity: Not on file  Other Topics Concern  . 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 Difficulty of Paying Living Expenses:   Food Insecurity:   . Worried About Charity fundraiser in the Last Year:   . Arboriculturist in the Last Year:   Transportation Needs:   . Film/video editor (Medical):   [name] Lack of Transportation (Non-Medical):   Physical Activity:   . Days of Exercise per Week:   . Minutes of Exercise per Session:   Stress:   . Feeling of Stress :   Social Connections:   . Frequency of Communication with Friends and Family:   . Frequency of Social Gatherings with Friends and Family:   . Attends Religious Services:   . Active Member of Clubs or Organizations:   . Attends Archivist Meetings:   [name] Marital Status:     Outpatient Encounter Medications as of 04/28/2019  Medication Sig  . citalopram (CELEXA) 40 MG tablet Take 1 tablet (40 mg total) by mouth daily.  . cloNIDine (CATAPRES) 0.1 MG tablet Take 1 tablet (0.1 mg total) by mouth daily.  . cyclobenzaprine (FLEXERIL) 5 MG tablet Take 1 tablet (5 mg total) by mouth 3 (three) times daily as needed for muscle spasms.  [name] gabapentin (NEURONTIN) 300 MG capsule Take 1 capsule (300 mg total) by mouth 3 (three) times daily as needed.  . hydrochlorothiazide (HYDRODIURIL) 25 MG tablet Take 1 tablet (25 mg total) by mouth daily.  [name] HYDROcodone-acetaminophen (NORCO/VICODIN) 5-325 MG tablet Take 1-2 tablets by mouth every 4 (four) hours as needed for moderate pain.  . hydrOXYzine (VISTARIL) 25 MG capsule Take 1 capsule (25 mg  total) by mouth 3 (three) times daily as needed.  . metFORMIN (GLUCOPHAGE) 500 MG tablet Take 1 tablet (500 mg total) by mouth 2 (two) times daily with a meal.  . pravastatin (PRAVACHOL) 40 MG tablet TAKE 1 TABLET DAILY.  . Suvorexant (BELSOMRA) 10 MG TABS Take 10 mg by mouth at bedtime.   No facility-administered encounter medications on file as of 04/28/2019.    Activities of Daily Living In your present state of health, do you have any difficulty performing the following activities: 04/28/2019 12/11/2018  Hearing? N N  Comment no hearing  aids -  Vision? N N  Comment no glasses, dr.[email or web address] -  Difficulty concentrating or making decisions? N N  Walking or climbing stairs? N N  Dressing or bathing? N N  Doing errands, shopping? N N  Preparing Food and eating ? N -  Using the Toilet? N -  In the past six months, have you accidently leaked urine? N -  Do you have problems with loss of bowel control? N -  Managing your Medications? N -  Managing your Finances? N -  Housekeeping or managing your Housekeeping? N -  Some recent data might be hidden    Patient Care Team: Particia Nearing, PA-C as PCP - General (Family Medicine) Pasty Spillers, MD as Consulting Physician (Gastroenterology) Ancil Linsey, MD as Consulting Physician (General Surgery) Marlowe Sax, RN as Case Manager (General Practice)    Assessment:   This is a routine wellness examination for Hanako.  Exercise Activities and Dietary recommendations Current Exercise Habits: The patient does not participate in regular exercise at present, Exercise limited by: None identified  Goals Addressed   None     Fall Risk: Fall Risk  04/28/2019 12/11/2018 10/21/2018 10/07/2018 04/01/2018  Falls in the past year? 0 0 0 0 0  Number falls in past yr: 0 0 - - -  Injury with Fall? 0 0 - - -  Follow up - - - - -    FALL RISK PREVENTION PERTAINING TO THE HOME:  Any stairs in or around the home? Yes  If so, are  there any without handrails? No   Home free of loose throw rugs in walkways, pet beds, electrical cords, etc? Yes  Adequate lighting in your home to reduce risk of falls? Yes   ASSISTIVE DEVICES UTILIZED TO PREVENT FALLS:  Life alert? No  Use of a cane, walker or w/c? No  Grab bars in the bathroom? Yes  Shower chair or bench in shower? Yes  Elevated toilet seat or a handicapped toilet? No   DME ORDERS:  DME order needed?  No   TIMED UP AND GO:  Unable to perform    Depression Screen PHQ 2/9 Scores 04/28/2019 12/11/2018 06/03/2018 04/01/2018  PHQ - 2 Score 0 0 1 0  PHQ- 9 Score - 1 1 -     Cognitive Function     6CIT Screen 04/01/2018 11/13/2016  What Year? 0 points 0 points  What month? 0 points 0 points  What time? 0 points 0 points  Count back from 20 0 points 0 points  Months in reverse 0 points 0 points  Repeat phrase 2 points 0 points  Total Score 2 0    Immunization History  Administered Date(s) Administered  . Influenza, High Dose Seasonal PF 11/13/2016, 10/15/2017, 10/21/2018  . PFIZER SARS-COV-2 Vaccination 04/02/2019, 04/27/2019  . Pneumococcal Conjugate-13 11/13/2016  . Pneumococcal Polysaccharide-23 12/03/2017  . Tdap 11/17/2013  . Zoster Recombinat (Shingrix) 02/21/2018, 06/22/2018    Qualifies for Shingles Vaccine? Shingrix completed   Tdap: up to date   Flu Vaccine: up to date   Pneumococcal Vaccine: up to date   Covid-19 Vaccine:  Completed vaccines  Screening Tests Health Maintenance  Topic Date Due  . INFLUENZA VACCINE  08/22/2019  . MAMMOGRAM  07/20/2020  . TETANUS/TDAP  11/18/2023  . COLONOSCOPY  08/24/2028  . DEXA SCAN  Completed  . Hepatitis C Screening  Completed  . PNA vac Low Risk Adult  Completed    Cancer Screenings:  Colorectal Screening: Completed 2020. Repeat every 10 years  Mammogram: Completed 07/21/2018. Repeat every year  Bone Density: Completed 07/21/2018  Lung Cancer Screening: (Low Dose CT Chest recommended  if Age 97-80 years, 30 pack-year currently smoking OR have quit w/in 15years.) does not qualify.    Additional Screening:  Hepatitis C Screening: does qualify; Completed 2019  Vision Screening: Recommended annual ophthalmology exams for early detection of glaucoma and other disorders of the eye. Is the patient up to date with their annual eye exam?  Yes  Who is the provider or what is the name of the office in which the pt attends annual eye exams? [facility]   Dental Screening: Recommended annual dental exams for proper oral hygiene  Community Resource Referral:  CRR required this visit?  No       Plan:  I have personally reviewed and addressed the Medicare Annual Wellness questionnaire and have noted the following in the patient's chart:  A. Medical and social history B. Use of alcohol, tobacco or illicit drugs  C. Current medications and supplements D. Functional ability and status E.  Nutritional status F.  Physical activity G. Advance directives H. List of other physicians I.  Hospitalizations, surgeries, and ER visits in previous 12 months J.  Vitals K. Screenings such as hearing and vision if needed, cognitive and depression L. Referrals and appointments   In addition, I have reviewed and discussed with patient certain preventive protocols, quality metrics, and best practice recommendations. A written personalized care plan for preventive services as well as general preventive health recommendations were provided to patient.  Signed,    Collene Schlichter, LPN  08/24/3823 Nurse Health Advisor   Nurse Notes: Patient states she lost 19 pounds since last visit. Has cut back on all her sweets since finding out she was a diabetic and has worked hard to eat a healthy diet. States her BS have been around 98 daily.

## 2019-04-28 NOTE — Patient Instructions (Signed)
Marcia [name] , Thank you for taking time to come for your Medicare Wellness Visit. I appreciate your ongoing commitment to your health goals. Please review the following plan we discussed and let me know if I can assist you in the future.   Screening recommendations/referrals: Colonoscopy: up to date Mammogram: up to date  Bone Density: up to date  Recommended yearly ophthalmology/optometry visit for glaucoma screening and checkup Recommended yearly dental visit for hygiene and checkup  Vaccinations: Influenza vaccine: up to date  Pneumococcal vaccine: up to date  Tdap vaccine: up to date  Shingles vaccine: up to date    Covid-19: completed   Advanced directives: Advance directive discussed with you today. I have provided a copy for you to complete at home and have notarized. Once this is complete please bring a copy in to our office so we can scan it into your chart.  Conditions/risks identified: diabetic, keep up the great work with diet control!  Next appointment: Follow up in one year for your annual wellness visit    Preventive Care 65 Years and Older, Female Preventive care refers to lifestyle choices and visits with your health care provider that can promote health and wellness. What does preventive care include?  A yearly physical exam. This is also called an annual well check.  Dental exams once or twice a year.  Routine eye exams. Ask your health care provider how often you should have your eyes checked.  Personal lifestyle choices, including:  Daily care of your teeth and gums.  Regular physical activity.  Eating a healthy diet.  Avoiding tobacco and drug use.  Limiting alcohol use.  Practicing safe sex.  Taking low-dose aspirin every day.  Taking vitamin and mineral supplements as recommended by your health care provider. What happens during an annual well check? The services and screenings done by your health care provider during your annual well check  will depend on your age, overall health, lifestyle risk factors, and family history of disease. Counseling  Your health care provider may ask you questions about your:  Alcohol use.  Tobacco use.  Drug use.  Emotional well-being.  Home and relationship well-being.  Sexual activity.  Eating habits.  History of falls.  Memory and ability to understand (cognition).  Work and work Astronomer.  Reproductive health. Screening  You may have the following tests or measurements:  Height, weight, and BMI.  Blood pressure.  Lipid and cholesterol levels. These may be checked every 5 years, or more frequently if you are over 60 years old.  Skin check.  Lung cancer screening. You may have this screening every year starting at age 4 if you have a 30-pack-year history of smoking and currently smoke or have quit within the past 15 years.  Fecal occult blood test (FOBT) of the stool. You may have this test every year starting at age 20.  Flexible sigmoidoscopy or colonoscopy. You may have a sigmoidoscopy every 5 years or a colonoscopy every 10 years starting at age 86.  Hepatitis C blood test.  Hepatitis B blood test.  Sexually transmitted disease (STD) testing.  Diabetes screening. This is done by checking your blood sugar (glucose) after you have not eaten for a while (fasting). You may have this done every 1-3 years.  Bone density scan. This is done to screen for osteoporosis. You may have this done starting at age 68.  Mammogram. This may be done every 1-2 years. Talk to your health care provider about how often  you should have regular mammograms. Talk with your health care provider about your test results, treatment options, and if necessary, the need for more tests. Vaccines  Your health care provider may recommend certain vaccines, such as:  Influenza vaccine. This is recommended every year.  Tetanus, diphtheria, and acellular pertussis (Tdap, Td) vaccine. You may  need a Td booster every 10 years.  Zoster vaccine. You may need this after age 63.  Pneumococcal 13-valent conjugate (PCV13) vaccine. One dose is recommended after age 78.  Pneumococcal polysaccharide (PPSV23) vaccine. One dose is recommended after age 83. Talk to your health care provider about which screenings and vaccines you need and how often you need them. This information is not intended to replace advice given to you by your health care provider. Make sure you discuss any questions you have with your health care provider. Document Released: 02/03/2015 Document Revised: 09/27/2015 Document Reviewed: 11/08/2014 Elsevier Interactive Patient Education  2017 [employer] Prevention in the Home Falls can cause injuries. They can happen to people of all ages. There are many things you can do to make your home safe and to help prevent falls. What can I do on the outside of my home?  Regularly fix the edges of walkways and driveways and fix any cracks.  Remove anything that might make you trip as you walk through a door, such as a raised step or threshold.  Trim any bushes or trees on the path to your home.  Use bright outdoor lighting.  Clear any walking paths of anything that might make someone trip, such as rocks or tools.  Regularly check to see if handrails are loose or broken. Make sure that both sides of any steps have handrails.  Any raised decks and porches should have guardrails on the edges.  Have any leaves, snow, or ice cleared regularly.  Use sand or salt on walking paths during winter.  Clean up any spills in your garage right away. This includes oil or grease spills. What can I do in the bathroom?  Use night lights.  Install grab bars by the toilet and in the tub and shower. Do not use towel bars as grab bars.  Use non-skid mats or decals in the tub or shower.  If you need to sit down in the shower, use a plastic, non-slip stool.  Keep the floor  dry. Clean up any water that spills on the floor as soon as it happens.  Remove soap buildup in the tub or shower regularly.  Attach bath mats securely with double-sided non-slip rug tape.  Do not have throw rugs and other things on the floor that can make you trip. What can I do in the bedroom?  Use night lights.  Make sure that you have a light by your bed that is easy to reach.  Do not use any sheets or blankets that are too big for your bed. They should not hang down onto the floor.  Have a firm chair that has side arms. You can use this for support while you get dressed.  Do not have throw rugs and other things on the floor that can make you trip. What can I do in the kitchen?  Clean up any spills right away.  Avoid walking on wet floors.  Keep items that you use a lot in easy-to-reach places.  If you need to reach something above you, use a strong step stool that has a grab bar.  Keep electrical cords  out of the way.  Do not use floor polish or wax that makes floors slippery. If you must use wax, use non-skid floor wax.  Do not have throw rugs and other things on the floor that can make you trip. What can I do with my stairs?  Do not leave any items on the stairs.  Make sure that there are handrails on both sides of the stairs and use them. Fix handrails that are broken or loose. Make sure that handrails are as long as the stairways.  Check any carpeting to make sure that it is firmly attached to the stairs. Fix any carpet that is loose or worn.  Avoid having throw rugs at the top or bottom of the stairs. If you do have throw rugs, attach them to the floor with carpet tape.  Make sure that you have a light switch at the top of the stairs and the bottom of the stairs. If you do not have them, ask someone to add them for you. What else can I do to help prevent falls?  Wear shoes that:  Do not have high heels.  Have rubber bottoms.  Are comfortable and fit you  well.  Are closed at the toe. Do not wear sandals.  If you use a stepladder:  Make sure that it is fully opened. Do not climb a closed stepladder.  Make sure that both sides of the stepladder are locked into place.  Ask someone to hold it for you, if possible.  Clearly mark and make sure that you can see:  Any grab bars or handrails.  First and last steps.  Where the edge of each step is.  Use tools that help you move around (mobility aids) if they are needed. These include:  Canes.  Walkers.  Scooters.  Crutches.  Turn on the lights when you go into a dark area. Replace any light bulbs as soon as they burn out.  Set up your furniture so you have a clear path. Avoid moving your furniture around.  If any of your floors are uneven, fix them.  If there are any pets around you, be aware of where they are.  Review your medicines with your doctor. Some medicines can make you feel dizzy. This can increase your chance of falling. Ask your doctor what other things that you can do to help prevent falls. This information is not intended to replace advice given to you by your health care provider. Make sure you discuss any questions you have with your health care provider. Document Released: 11/03/2008 Document Revised: 06/15/2015 Document Reviewed: 02/11/2014 Elsevier Interactive Patient Education  2017 [employer].

## 2019-04-29 ENCOUNTER — Other Ambulatory Visit: Payer: Self-pay | Admitting: Family Medicine

## 2019-04-29 NOTE — Telephone Encounter (Signed)
Requested Prescriptions  Pending Prescriptions Disp Refills  . hydrOXYzine (VISTARIL) 25 MG capsule [Pharmacy Med Name: HYDROXYZINE PAM 25 MG CAP] 90 capsule 0    Sig: Take 1 capsule (25 mg total) by mouth 3 (three) times daily as needed.     Ear, Nose, and Throat:  Antihistamines Passed - 04/29/2019  9:58 AM      Passed - Valid encounter within last 12 months    Recent Outpatient Visits          4 months ago Mixed hyperlipidemia   [facility] Roosvelt Maser [place], [place]   9 months ago Rash   [facility] Ro[facility] Maser [place], [place]   11 months ago Essential hypertension   [facility] Ro[facility] Maser [place], [place]   1 year ago Right sided sciatica   [facility] Particia Nearing, [place]   1 year ago Essential hypertension   [facility] Particia Nearing, [place]      Future Appointments            In 3 weeks Maurice March, Salley Hews, PA-C [facility]   In 1 year  [employer]

## 2019-05-04 ENCOUNTER — Ambulatory Visit (INDEPENDENT_AMBULATORY_CARE_PROVIDER_SITE_OTHER): Payer: Medicare Other | Admitting: General Practice

## 2019-05-04 ENCOUNTER — Telehealth: Payer: Self-pay | Admitting: General Practice

## 2019-05-04 DIAGNOSIS — I10 Essential (primary) hypertension: Secondary | ICD-10-CM | POA: Diagnosis not present

## 2019-05-04 DIAGNOSIS — E782 Mixed hyperlipidemia: Secondary | ICD-10-CM

## 2019-05-04 DIAGNOSIS — E119 Type 2 diabetes mellitus without complications: Secondary | ICD-10-CM | POA: Diagnosis not present

## 2019-05-04 DIAGNOSIS — F419 Anxiety disorder, unspecified: Secondary | ICD-10-CM

## 2019-05-04 NOTE — Patient Instructions (Signed)
Visit Information  Goals Addressed            This Visit's Progress   . RNCM: Pt-"I have been checking my blood sugars too because she said I  had diabetes number 2"       Current Barriers:  . Chronic Disease Management support, education, and care coordination needs related to HTN, HLD, DMII, and Anxiety . Currently does not have a blood pressure cuff  Clinical Goal(s) related to HTN, HLD, DMII, and Anxiety:  Over the next 120 days, patient will:  . Work with the care management team to address educational, disease management, and care coordination needs  . Begin or continue self health monitoring activities as directed today Measure and record cbg (blood glucose) BID times daily and Measure and record blood pressure 2 times per week . Call provider office for new or worsened signs and symptoms Blood glucose findings outside established parameters, Blood pressure findings outside established parameters, and New or worsened symptom related to anxiety  . Call care management team with questions or concerns . Verbalize basic understanding of patient centered plan of care established today  Interventions related to HTN, HLD, DMII, and Anxiety:  . Evaluation of current treatment plans and patient's adherence to plan as established by provider.  The patient has a good understanding of her chronic conditions. The patient is happy to report that her blood sugars are staying around 98. . Assessed patient understanding of disease states.  The Patient is doing well and denies any new concerns at this time. Has upcoming appointment with pcp . Assessed patient's education and care coordination needs.  Review of heart healthy/ADA diet. The patient is mindful of her food intake. She has switched over to sugar free products including ice cream, Reece cups, puddings and other sugar free products. Praised for positive lifestyle changes.   . Provided disease specific education to patient. Evaluation of blood  pressure checks.  The patient is not taking blood pressures at home but feels her blood pressures are good. She is compliant with medications regimen. . Evaluation of exercise regimen. 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 She does ride the exercise bike at least 2 days a week for 20 minutes.  Encouraged activity.   . Evaluation of questions/concerns related to the COVID vaccines. The patient has had both vaccines and denies any concerns related to the vaccinations.  Nash Dimmer with appropriate clinical care team members regarding patient needs.  The patient denies any needs at this time. Knows the LCSW and Pharmacist are available as needed.   Patient Self Care Activities related to HTN, HLD, DMII, and Anxiety:  . Patient is unable to independently self-manage chronic health conditions  Please see past updates related to this goal by clicking on the "Past Updates" button in the selected goal      . COMPLETED: RNCM: PT-I want to keep my bones healthy (pt-stated)       Current Barriers:  . Chronic Disease Management support and education needs related to osteopenia  Nurse Case Manager Clinical Goal(s):  [name] Over the next 120 days, patient will demonstrate improved health management independence as evidenced by self-reported increased exercise, addition of high calcium foods and a vitamin D supplement  Interventions:  . Evaluation of current treatment plan related to Hypertension  and patient's adherence to plan as established by provider. . Advised patient to Exercise at least 3 times a week including  lite weight training in her routine once she has been cleared by surgeon.  . Discussed plans with patient for ongoing care management follow up and provided patient with direct contact information for care management team . Evaluation of Patient checking her blood pressure at home and reports good control.  . Evaluation of patients compliance with  medication regimen  Patient Self Care Activities:  . Attends church or other social activities . Performs ADL's independently . Performs IADL's independently  Please see past updates related to this goal by clicking on the "Past Updates" button in the selected goal      . COMPLETED: RNCM: Pt: "Do you know when I can get the COVID shot at the office?" (pt-stated)       Current Barriers: Patient has received both COVID vaccines- denies any issues at this time . Knowledge Deficits related to COVID-19 and impact on patient self health management  Clinical Goal(s):  [name] Over the next 30 days, patient will verbalize basic understanding of COVID-19 impact on individual health and self health management as evidenced by verbalization of basic understanding of COVID-19 as a viral disease, measures to prevent exposure, signs and symptoms, when to contact provider  Interventions: . Evaluation of current treatment plan related to COVID19  and patient's adherence to plan as established by provider. . Provided education to patient re: COVID19 vaccination available in the county and assisted with adding the patient to the waitlist for a call when an appointment for vaccination is available  . Provided patient and/or caregiver with COVID19  information about places in [facility] offering the COVID19 vaccine.  The patient has been added to the waitlist  Nurse, learning disability).  Patient Self Care Activities:  . Patient verbalizes understanding of plan to be contacted once the vaccine becomes available and an appointment can be made. Patient is currently on the waitlist . Attends all scheduled provider appointments . Calls provider office for new concerns or questions  Initial goal documentation                      Patient verbalizes understanding of instructions provided today.   The care management team will reach out to the patient again over the next 60 days.   Alto Denver RN, MSN,  CCM Community Care Coordinator [place]  [facility] Mobile: [phone]

## 2019-05-04 NOTE — Chronic Care Management (AMB) (Signed)
Chronic Care Management   Follow Up Note   05/04/2019 Name: Marcia [name] MRN: [id number] DOB: [birth year]  Referred by: Volney American, PA-C Reason for referral : Chronic Care Management (RNCM Chronic Disease Management and care coordination needs)   Marcia [name] is a 69 y.o. year old female who is a primary care patient of Volney American, [place]. The CCM team was consulted for assistance with chronic disease management and care coordination needs.    Review of patient status, including review of consultants reports, relevant laboratory and other test results, and collaboration with appropriate care team members and the patient's provider was performed as part of comprehensive patient evaluation and provision of chronic care management services.    SDOH (Social Determinants of Health) assessments performed: Yes See Care Plan activities for detailed interventions related to SDOH)  SDOH Interventions     Most Recent Value  SDOH Interventions  SDOH Interventions for the Following Domains  Physical Activity, Social Connections, Tobacco  Physical Activity Interventions  Other (Comments) [uses an exercise bike 2 days a week for 20 mins]  Social Connections Interventions  Other (Comment) [the patient has good support system]  Tobacco Interventions  Other (Comment) [the patient vapes]       Outpatient Encounter Medications as of 05/04/2019  Medication Sig   citalopram (CELEXA) 40 MG tablet Take 1 tablet (40 mg total) by mouth daily.   cloNIDine (CATAPRES) 0.1 MG tablet Take 1 tablet (0.1 mg total) by mouth daily.   cyclobenzaprine (FLEXERIL) 5 MG tablet Take 1 tablet (5 mg total) by mouth 3 (three) times daily as needed for muscle spasms.   gabapentin (NEURONTIN) 300 MG capsule Take 1 capsule (300 mg total) by mouth 3 (three) times daily as needed.   hydrochlorothiazide (HYDRODIURIL) 25 MG tablet Take 1 tablet (25 mg total) by mouth daily.   HYDROcodone-acetaminophen  (NORCO/VICODIN) 5-325 MG tablet Take 1-2 tablets by mouth every 4 (four) hours as needed for moderate pain.   hydrOXYzine (VISTARIL) 25 MG capsule Take 1 capsule (25 mg total) by mouth 3 (three) times daily as needed.   metFORMIN (GLUCOPHAGE) 500 MG tablet Take 1 tablet (500 mg total) by mouth 2 (two) times daily with a meal.   pravastatin (PRAVACHOL) 40 MG tablet TAKE 1 TABLET DAILY.   Suvorexant (BELSOMRA) 10 MG TABS Take 10 mg by mouth at bedtime.   No facility-administered encounter medications on file as of 05/04/2019.     Objective:  BP Readings from Last 3 Encounters:  12/11/18 128/81  12/09/18 134/85  11/26/18 (!) 153/61    Goals Addressed            This Visit's Progress    RNCM: Pt-"I have been checking my blood sugars too because she said I  had diabetes number 2"       Current Barriers:   Chronic Disease Management support, education, and care coordination needs related to HTN, HLD, DMII, and Anxiety  Currently does not have a blood pressure cuff  Clinical Goal(s) related to HTN, HLD, DMII, and Anxiety:  Over the next 120 days, patient will:   Work with the care management team to address educational, disease management, and care coordination needs   Begin or continue self health monitoring activities as directed today Measure and record cbg (blood glucose) BID times daily and Measure and record blood pressure 2 times per week  Call provider office for new or worsened signs and symptoms Blood glucose findings outside established parameters, Blood  pressure findings outside established parameters, and New or worsened symptom related to anxiety   Call care management team with questions or concerns  Verbalize basic understanding of patient centered plan of care established today  Interventions related to HTN, HLD, DMII, and Anxiety:   Evaluation of current treatment plans and patient's adherence to plan as established by provider.  The patient has a good  understanding of her chronic conditions. The patient is happy to report that her blood sugars are staying around 98.  Assessed patient understanding of disease states.  The Patient is doing well and denies any new concerns at this time. Has upcoming appointment with pcp  Assessed patient's education and care coordination needs.  Review of heart healthy/ADA diet. The patient is mindful of her food intake. She has switched over to sugar free products including ice cream, Reece cups, puddings and other sugar free products. Praised for positive lifestyle changes.    Provided disease specific education to patient. Evaluation of blood pressure checks.  The patient is not taking blood pressures at home but feels her blood pressures are good. She is compliant with medications regimen.  Evaluation of exercise regimen. 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 She does ride the exercise bike at least 2 days a week for 20 minutes.  Encouraged activity.    Evaluation of questions/concerns related to the COVID vaccines. The patient has had both vaccines and denies any concerns related to the vaccinations.   Collaborated with appropriate clinical care team members regarding patient needs.  The patient denies any needs at this time. Knows the LCSW and Pharmacist are available as needed.   Patient Self Care Activities related to HTN, HLD, DMII, and Anxiety:   Patient is unable to independently self-manage chronic health conditions  Please see past updates related to this goal by clicking on the "Past Updates" button in the selected goal       COMPLETED: RNCM: PT-I want to keep my bones healthy (pt-stated)       Current Barriers:   Chronic Disease Management support and education needs related to osteopenia  Nurse Case Manager Clinical Goal(s):   Over the next 120 days, patient will demonstrate improved health management independence as evidenced by self-reported  increased exercise, addition of high calcium foods and a vitamin D supplement  Interventions:   Evaluation of current treatment plan related to Hypertension  and patient's adherence to plan as established by provider.  Advised patient to Exercise at least 3 times a week including lite weight training in her routine once she has been cleared by surgeon.   Discussed plans with patient for ongoing care management follow up and provided patient with direct contact information for care management team  Evaluation of Patient checking her blood pressure at home and reports good control.   Evaluation of patients compliance with medication regimen  Patient Self Care Activities:   Attends church or other social activities  Performs ADL's independently  Performs IADL's independently  Please see past updates related to this goal by clicking on the "Past Updates" button in the selected goal       COMPLETED: RNCM: Pt: "Do you know when I can get the COVID shot at the office?" (pt-stated)       Current Barriers: Patient has received both COVID vaccines- denies any issues at this time  Knowledge Deficits related to COVID-19 and impact on patient self health management  Clinical  Goal(s):   Over the next 30 days, patient will verbalize basic understanding of COVID-19 impact on individual health and self health management as evidenced by verbalization of basic understanding of COVID-19 as a viral disease, measures to prevent exposure, signs and symptoms, when to contact provider  Interventions:  Evaluation of current treatment plan related to COVID19  and patient's adherence to plan as established by provider.  Provided education to patient re: COVID19 vaccination available in the county and assisted with adding the patient to the waitlist for a call when an appointment for vaccination is available   Provided patient and/or caregiver with COVID19  information about places in [facility]  offering the COVID19 vaccine.  The patient has been added to the waitlist  Nurse, learning disability).  Patient Self Care Activities:   Patient verbalizes understanding of plan to be contacted once the vaccine becomes available and an appointment can be made. Patient is currently on the waitlist  Attends all scheduled provider appointments  Calls provider office for new concerns or questions  Initial goal documentation                       Plan:   The care management team will reach out to the patient again over the next 60 days.    Alto Denver RN, MSN, CCM Community Care Coordinator [place]   [facility] Mobile: [phone]

## 2019-05-18 ENCOUNTER — Other Ambulatory Visit: Payer: Self-pay | Admitting: Family Medicine

## 2019-05-21 ENCOUNTER — Ambulatory Visit (INDEPENDENT_AMBULATORY_CARE_PROVIDER_SITE_OTHER): Payer: Medicare Other | Admitting: Family Medicine

## 2019-05-21 ENCOUNTER — Encounter: Payer: Self-pay | Admitting: Family Medicine

## 2019-05-21 ENCOUNTER — Other Ambulatory Visit: Payer: Self-pay

## 2019-05-21 VITALS — BP 113/69 | HR 78 | Temp 98.3°F | Wt 162.0 lb

## 2019-05-21 DIAGNOSIS — E119 Type 2 diabetes mellitus without complications: Secondary | ICD-10-CM

## 2019-05-21 DIAGNOSIS — E782 Mixed hyperlipidemia: Secondary | ICD-10-CM | POA: Diagnosis not present

## 2019-05-21 DIAGNOSIS — F419 Anxiety disorder, unspecified: Secondary | ICD-10-CM | POA: Diagnosis not present

## 2019-05-21 DIAGNOSIS — G4700 Insomnia, unspecified: Secondary | ICD-10-CM | POA: Diagnosis not present

## 2019-05-21 DIAGNOSIS — I10 Essential (primary) hypertension: Secondary | ICD-10-CM | POA: Diagnosis not present

## 2019-05-21 MED ORDER — HYDROCHLOROTHIAZIDE 25 MG PO TABS: 25.0000 mg | ORAL_TABLET | Freq: Every day | ORAL | 1 refills | Status: DC

## 2019-05-21 MED ORDER — CLONIDINE HCL 0.1 MG PO TABS: 0.1000 mg | ORAL_TABLET | Freq: Every day | ORAL | 1 refills | Status: DC

## 2019-05-21 MED ORDER — BELSOMRA 10 MG PO TABS: 10.0000 mg | ORAL_TABLET | Freq: Every day | ORAL | 5 refills | Status: DC

## 2019-05-21 MED ORDER — METFORMIN HCL 500 MG PO TABS: 500.0000 mg | ORAL_TABLET | Freq: Two times a day (BID) | ORAL | 1 refills | Status: DC

## 2019-05-21 NOTE — Assessment & Plan Note (Signed)
Stable and under good control, continue belsomra and good sleep hygiene

## 2019-05-21 NOTE — Assessment & Plan Note (Signed)
Recheck lipids, adjust as needed. Continue current regimen 

## 2019-05-21 NOTE — Assessment & Plan Note (Signed)
Recheck A1C, continue excellent lifestyle modifications and current regimen.

## 2019-05-21 NOTE — Assessment & Plan Note (Signed)
Stable and well controlled, continue current regimen 

## 2019-05-21 NOTE — Assessment & Plan Note (Signed)
BPs stable and well controlled, continue current regimen 

## 2019-05-21 NOTE — Progress Notes (Signed)
BP 113/69   Pulse 78   Temp 98.3 F (36.8 C) (Oral)   Wt 162 lb (73.5 kg)   SpO2 94%   BMI 25.37 kg/m    Subjective:    Patient ID: Marcia [name], female    DOB: Aug 24, 1950, 69 y.o.   MRN: [id number]  HPI: Marcia [name] is a 69 y.o. female  Chief Complaint  Patient presents with  . Diabetes  . Hypertension  . Hyperlipidemia  . Anxiety   Here today for 6 month f/u chronic conditions.   HTN - Home BPs when checked running around or below 120/70s range. Taking medicines faithfully without side effects. Denies CP, SOB, HAs, dizziness.   HLD - Tolerating pravastatin well, denies myalgias, claudication. Has made diet changes and lost nearly 20 lb. Exercises regularly   DM - Tolerating metformin well and has cut out sugar from her diet almost entirely which has helped her lose nearly 20 lb since diagnosis with DM. Denies side effects, low blood sugar spells.   Insomnia - Belsomra working well, no issues reported  Depression - celexa and prn vistaril doing well for her moods.   Chronic back pain doing well on gabapentin and flexeril prn. No recent flares.   Depression screen [facility] 2/9 05/21/2019 05/04/2019 04/28/2019  Decreased Interest 0 0 0  Down, Depressed, Hopeless 0 0 0  PHQ - 2 Score 0 0 0  Altered sleeping 0 - -  Tired, decreased energy 0 - -  Change in appetite 0 - -  Feeling bad or failure about yourself  0 - -  Trouble concentrating 0 - -  Moving slowly or fidgety/restless 0 - -  Suicidal thoughts 0 - -  PHQ-9 Score 0 - -  Difficult doing work/chores - - -   GAD 7 : Generalized Anxiety Score 05/21/2019 06/03/2018 12/03/2017 10/15/2017  Nervous, Anxious, on Edge 0 0 0 0  Control/stop worrying 0 0 0 0  Worry too much - different things 0 0 0 0  Trouble relaxing 0 0 0 0  Restless 0 0 0 0  Easily annoyed or irritable 0 0 0 0  Afraid - awful might happen 0 0 0 0  Total GAD 7 Score 0 0 0 0  Anxiety Difficulty Not difficult at all Not difficult at all - -    Relevant past medical, surgical, family and social history reviewed and updated as indicated. Interim medical history since our last visit reviewed. Allergies and medications reviewed and updated.  Review of Systems  Per HPI unless specifically indicated above     Objective:    BP 113/69   Pulse 78   Temp 98.3 F (36.8 C) (Oral)   Wt 162 lb (73.5 kg)   SpO2 94%   BMI 25.37 kg/m   Wt Readings from Last 3 Encounters:  05/21/19 162 lb (73.5 kg)  04/28/19 160 lb (72.6 kg)  12/11/18 179 lb (81.2 kg)    Physical Exam Vitals and nursing note reviewed.  Constitutional:      Appearance: Normal appearance. She is not ill-appearing.  HENT:     Head: Atraumatic.  Eyes:     Extraocular Movements: Extraocular movements intact.     Conjunctiva/sclera: Conjunctivae normal.  Cardiovascular:     Rate and Rhythm: Normal rate and regular rhythm.     Heart sounds: Normal heart sounds.  Pulmonary:     Effort: Pulmonary effort is normal.     Breath sounds: Normal breath sounds.  Musculoskeletal:  General: Normal range of motion.     Cervical back: Normal range of motion and neck supple.  Skin:    General: Skin is warm and dry.  Neurological:     Mental Status: She is alert and oriented to person, place, and time.  Psychiatric:        Mood and Affect: Mood normal.        Thought Content: Thought content normal.        Judgment: Judgment normal.     Results for orders placed or performed in visit on 12/11/18  CBC with Differential/Platelet out  Result Value Ref Range   WBC 6.7 3.4 - 10.8 x10E3/uL   RBC 4.95 3.77 - 5.28 x10E6/uL   Hemoglobin 14.6 11.1 - 15.9 g/dL   Hematocrit 51.7 00.1 - 46.6 %   MCV 86 79 - 97 fL   MCH 29.5 26.6 - 33.0 pg   MCHC 34.4 31.5 - 35.7 g/dL   RDW 74.9 44.9 - 67.5 %   Platelets 330 150 - 450 x10E3/uL   Neutrophils 55 Not Estab. %   Lymphs 31 Not Estab. %   Monocytes 8 Not Estab. %   Eos 5 Not Estab. %   Basos 1 Not Estab. %   Neutrophils  Absolute 3.6 1.4 - 7.0 x10E3/uL   Lymphocytes Absolute 2.1 0.7 - 3.1 x10E3/uL   Monocytes Absolute 0.5 0.1 - 0.9 x10E3/uL   EOS (ABSOLUTE) 0.4 0.0 - 0.4 x10E3/uL   Basophils Absolute 0.1 0.0 - 0.2 x10E3/uL   Immature Granulocytes 0 Not Estab. %   Immature Grans (Abs) 0.0 0.0 - 0.1 x10E3/uL  Comprehensive metabolic panel  Result Value Ref Range   Glucose 92 65 - 99 mg/dL   BUN 16 8 - 27 mg/dL   Creatinine, Ser 9.16 0.57 - 1.00 mg/dL   GFR calc non Af Amer 73 >59 mL/min/1.73   GFR calc Af Amer 84 >59 mL/min/1.73   BUN/Creatinine Ratio 19 12 - 28   Sodium 140 134 - 144 mmol/L   Potassium 3.8 3.5 - 5.2 mmol/L   Chloride 101 96 - 106 mmol/L   CO2 23 20 - 29 mmol/L   Calcium 9.4 8.7 - 10.3 mg/dL   Total Protein 6.8 6.0 - 8.5 g/dL   Albumin 4.3 3.8 - 4.8 g/dL   Globulin, Total 2.5 1.5 - 4.5 g/dL   Albumin/Globulin Ratio 1.7 1.2 - 2.2   Bilirubin Total 0.4 0.0 - 1.2 mg/dL   Alkaline Phosphatase 70 39 - 117 IU/L   AST 20 0 - 40 IU/L   ALT 22 0 - 32 IU/L  Lipid Panel w/o Chol/HDL Ratio out  Result Value Ref Range   Cholesterol, Total 163 100 - 199 mg/dL   Triglycerides 384 (H) 0 - 149 mg/dL   HDL 36 (L) >66 mg/dL   VLDL Cholesterol Cal 34 5 - 40 mg/dL   LDL Chol Calc (NIH) 93 0 - 99 mg/dL  UA/M w/rflx Culture, Routine   Specimen: Urine   URINE  Result Value Ref Range   Specific Gravity, UA 1.010 1.005 - 1.030   pH, UA 5.0 5.0 - 7.5   Color, UA Yellow Yellow   Appearance Ur Clear Clear   Leukocytes,UA Negative Negative   Protein,UA Negative Negative/Trace   Glucose, UA Negative Negative   Ketones, UA Negative Negative   RBC, UA Negative Negative   Bilirubin, UA Negative Negative   Urobilinogen, Ur 0.2 0.2 - 1.0 mg/dL   Nitrite, UA Negative Negative  TSH  Result Value Ref Range   TSH 1.290 0.450 - 4.500 uIU/mL  HgB A1c  Result Value Ref Range   Hgb A1c MFr Bld 6.5 (H) 4.8 - 5.6 %   Est. average glucose Bld gHb Est-mCnc 140 mg/dL      Assessment & Plan:   Problem List  Items Addressed This Visit      Cardiovascular and Mediastinum   Hypertension - Primary    BPs stable and well controlled, continue current regimen      Relevant Medications   cloNIDine (CATAPRES) 0.1 MG tablet   hydrochlorothiazide (HYDRODIURIL) 25 MG tablet   Other Relevant Orders   Comprehensive metabolic panel     Endocrine   Type 2 diabetes mellitus without complication, without long-term current use of insulin (HCC)    Recheck A1C, continue excellent lifestyle modifications and current regimen.       Relevant Medications   metFORMIN (GLUCOPHAGE) 500 MG tablet   Other Relevant Orders   HgB A1c     Other   Hyperlipidemia    Recheck lipids, adjust as needed. Continue current regimen      Relevant Medications   cloNIDine (CATAPRES) 0.1 MG tablet   hydrochlorothiazide (HYDRODIURIL) 25 MG tablet   Other Relevant Orders   Lipid Panel w/o Chol/HDL Ratio   Anxiety    Stable and well controlled, continue current regimen      Insomnia    Stable and under good control, continue belsomra and good sleep hygiene          Follow up plan: Return in about 6 months (around 11/20/2019) for CPE.

## 2019-05-22 LAB — COMPREHENSIVE METABOLIC PANEL
ALT: 24 IU/L (ref 0–32)
AST: 24 IU/L (ref 0–40)
Albumin/Globulin Ratio: 2.2 (ref 1.2–2.2)
Albumin: 4.6 g/dL (ref 3.8–4.8)
Alkaline Phosphatase: 64 IU/L (ref 39–117)
BUN/Creatinine Ratio: 20 (ref 12–28)
BUN: 16 mg/dL (ref 8–27)
Bilirubin Total: 0.5 mg/dL (ref 0.0–1.2)
CO2: 24 mmol/L (ref 20–29)
Calcium: 9.3 mg/dL (ref 8.7–10.3)
Chloride: 103 mmol/L (ref 96–106)
Creatinine, Ser: 0.81 mg/dL (ref 0.57–1.00)
GFR calc Af Amer: 86 mL/min/{1.73_m2} (ref >59)
GFR calc non Af Amer: 75 mL/min/{1.73_m2} (ref >59)
Globulin, Total: 2.1 g/dL (ref 1.5–4.5)
Glucose: 120 mg/dL — ABNORMAL HIGH (ref 65–99)
Potassium: 4.3 mmol/L (ref 3.5–5.2)
Sodium: 140 mmol/L (ref 134–144)
Total Protein: 6.7 g/dL (ref 6.0–8.5)

## 2019-05-22 LAB — HEMOGLOBIN A1C
Est. average glucose Bld gHb Est-mCnc: 126 mg/dL
Hgb A1c MFr Bld: 6 % — ABNORMAL HIGH (ref 4.8–5.6)

## 2019-05-22 LAB — LIPID PANEL W/O CHOL/HDL RATIO
Cholesterol, Total: 163 mg/dL (ref 100–199)
HDL: 42 mg/dL (ref >39)
LDL Chol Calc (NIH): 95 mg/dL (ref 0–99)
Triglycerides: 150 mg/dL — ABNORMAL HIGH (ref 0–149)
VLDL Cholesterol Cal: 26 mg/dL (ref 5–40)

## 2019-06-23 ENCOUNTER — Other Ambulatory Visit: Payer: Self-pay | Admitting: Family Medicine

## 2019-06-23 NOTE — Telephone Encounter (Signed)
Requested Prescriptions  Pending Prescriptions Disp Refills  . hydrOXYzine (VISTARIL) 25 MG capsule [Pharmacy Med Name: HYDROXYZINE PAM 25 MG CAP] 90 capsule 0    Sig: Take 1 capsule (25 mg total) by mouth 3 (three) times daily as needed.     Ear, Nose, and Throat:  Antihistamines Passed - 06/23/2019  9:57 AM      Passed - Valid encounter within last 12 months    Recent Outpatient Visits          1 month ago Essential hypertension   [facility] Ro[place] Maser [place], [place]   6 months ago Mixed hyperlipidemia   [facility] Ro[place] Maser [place], [place]   11 months ago Rash   [facility] [place], [place], [place]   1 year ago Essential hypertension   [facility] Particia N[place], [place]   1 year ago Right sided sciatica   [facility] Particia Nearing, [place]      Future Appointments            In 10 months [facility]

## 2019-07-06 ENCOUNTER — Telehealth: Payer: Self-pay

## 2019-07-14 ENCOUNTER — Telehealth: Payer: Self-pay | Admitting: General Practice

## 2019-07-14 ENCOUNTER — Ambulatory Visit (INDEPENDENT_AMBULATORY_CARE_PROVIDER_SITE_OTHER): Payer: Medicare Other | Admitting: General Practice

## 2019-07-14 DIAGNOSIS — E782 Mixed hyperlipidemia: Secondary | ICD-10-CM | POA: Diagnosis not present

## 2019-07-14 DIAGNOSIS — F419 Anxiety disorder, unspecified: Secondary | ICD-10-CM

## 2019-07-14 DIAGNOSIS — I10 Essential (primary) hypertension: Secondary | ICD-10-CM | POA: Diagnosis not present

## 2019-07-14 DIAGNOSIS — E119 Type 2 diabetes mellitus without complications: Secondary | ICD-10-CM

## 2019-07-14 NOTE — Chronic Care Management (AMB) (Signed)
Chronic Care Management   Follow Up Note   07/14/2019 Name: ZANYIAH POSTEN MRN: [id number] DOB: 1950-12-21  Referred by: Volney American, PA-C Reason for referral : Chronic Care Management (Follow up: RNCM Chronic Disease Management and Care Coordination Needs)   TARON CONREY is a 69 y.o. year old female who is a primary care patient of Volney American, [place]. The CCM team was consulted for assistance with chronic disease management and care coordination needs.    Review of patient status, including review of consultants reports, relevant laboratory and other test results, and collaboration with appropriate care team members and the patient's provider was performed as part of comprehensive patient evaluation and provision of chronic care management services.    SDOH (Social Determinants of Health) assessments performed: Yes See Care Plan activities for detailed interventions related to [facility])     Outpatient Encounter Medications as of 07/14/2019  Medication Sig  . citalopram (CELEXA) 40 MG tablet Take 1 tablet (40 mg total) by mouth daily.  . cloNIDine (CATAPRES) 0.1 MG tablet Take 1 tablet (0.1 mg total) by mouth daily.  . cyclobenzaprine (FLEXERIL) 5 MG tablet Take 1 tablet (5 mg total) by mouth 3 (three) times daily as needed for muscle spasms.  [name] gabapentin (NEURONTIN) 300 MG capsule Take 1 capsule (300 mg total) by mouth 3 (three) times daily as needed.  . hydrochlorothiazide (HYDRODIURIL) 25 MG tablet Take 1 tablet (25 mg total) by mouth daily.  [name] HYDROcodone-acetaminophen (NORCO/VICODIN) 5-325 MG tablet Take 1-2 tablets by mouth every 4 (four) hours as needed for moderate pain.  . hydrOXYzine (VISTARIL) 25 MG capsule Take 1 capsule (25 mg total) by mouth 3 (three) times daily as needed.  . metFORMIN (GLUCOPHAGE) 500 MG tablet Take 1 tablet (500 mg total) by mouth 2 (two) times daily with a meal.  . pravastatin (PRAVACHOL) 40 MG tablet TAKE 1 TABLET DAILY.  .  Suvorexant (BELSOMRA) 10 MG TABS Take 10 mg by mouth at bedtime.   No facility-administered encounter medications on file as of 07/14/2019.     Objective:   Goals Addressed            This Visit's Progress   . RNCM: Pt-"I have been checking my blood sugars too because she said I  had diabetes number 2"       Current Barriers:  . Chronic Disease Management support, education, and care coordination needs related to HTN, HLD, DMII, and Anxiety . Currently does not have a blood pressure cuff  Clinical Goal(s) related to HTN, HLD, DMII, and Anxiety:  Over the next 120 days, patient will:  . Work with the care management team to address educational, disease management, and care coordination needs  . Begin or continue self health monitoring activities as directed today Measure and record cbg (blood glucose) BID times daily and Measure and record blood pressure 2 times per week . Call provider office for new or worsened signs and symptoms Blood glucose findings outside established parameters, Blood pressure findings outside established parameters, and New or worsened symptom related to anxiety  . Call care management team with questions or concerns . Verbalize basic understanding of patient centered plan of care established today  Interventions related to HTN, HLD, DMII, and Anxiety:  . Evaluation of current treatment plans and patient's adherence to plan as established by provider.  The patient has a good understanding of her chronic conditions. The patient is happy to report that her blood sugars are staying around  108. . Assessed patient understanding of disease states.  The Patient is doing well and denies any new concerns at this time. Has upcoming appointment with pcp . Assessed patient's education and care coordination needs.  Review of heart healthy/ADA diet. The patient is mindful of her food intake. She has switched over to sugar free products including ice cream, Reece cups, puddings  and other sugar free products. Praised for positive lifestyle changes.  The patient verbalized her church was bringing her supper tonight. 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 The patient was helping with the wheelchair and it came down on her big toe. She has been soaking it in epsom salt and using peroxide on it.   . Provided disease specific education to patient. Evaluation of blood pressure checks.  The patient is not taking blood pressures at home but feels her blood pressures are good. She is compliant with medications regimen. Denies any changes with her blood pressures. . Evaluation of exercise regimen. 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 She does ride the exercise bike at least 2 days a week for 20 minutes.  Encouraged activity.   . Evaluation of questions/concerns related to the COVID vaccines. The patient has had both vaccines and denies any concerns related to the vaccinations.  Steele Sizer with appropriate clinical care team members regarding patient needs.  The patient denies any needs at this time. Knows the LCSW and Pharmacist are available as needed.  . Education provided on s/s of infection to the toe she injured 2 weeks ago. The patient verbalized that she is watching it closely and will call or come to see the provider for any changes.   Patient Self Care Activities related to HTN, HLD, DMII, and Anxiety:  . Patient is unable to independently self-manage chronic health conditions  Please see past updates related to this goal by clicking on the "Past Updates" button in the selected goal          Plan:   The care management team will reach out to the patient again over the next 60 to 90 days.    Alto Denver RN, MSN, CCM Community Care Coordinator [place]  [facility] Mobile: [phone]

## 2019-07-14 NOTE — Patient Instructions (Signed)
Visit Information  Goals Addressed            This Visit's Progress   . RNCM: Pt-"I have been checking my blood sugars too because she said I  had diabetes number 2"       Current Barriers:  . Chronic Disease Management support, education, and care coordination needs related to HTN, HLD, DMII, and Anxiety . Currently does not have a blood pressure cuff  Clinical Goal(s) related to HTN, HLD, DMII, and Anxiety:  Over the next 120 days, patient will:  . Work with the care management team to address educational, disease management, and care coordination needs  . Begin or continue self health monitoring activities as directed today Measure and record cbg (blood glucose) BID times daily and Measure and record blood pressure 2 times per week . Call provider office for new or worsened signs and symptoms Blood glucose findings outside established parameters, Blood pressure findings outside established parameters, and New or worsened symptom related to anxiety  . Call care management team with questions or concerns . Verbalize basic understanding of patient centered plan of care established today  Interventions related to HTN, HLD, DMII, and Anxiety:  . Evaluation of current treatment plans and patient's adherence to plan as established by provider.  The patient has a good understanding of her chronic conditions. The patient is happy to report that her blood sugars are staying around 108. . Assessed patient understanding of disease states.  The Patient is doing well and denies any new concerns at this time. Has upcoming appointment with pcp . Assessed patient's education and care coordination needs.  Review of heart healthy/ADA diet. The patient is mindful of her food intake. She has switched over to sugar free products including ice cream, Reece cups, puddings and other sugar free products. Praised for positive lifestyle changes.  The patient verbalized her church was bringing her supper tonight. 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 The patient was helping with the wheelchair and it came down on her big toe. She has been soaking it in epsom salt and using peroxide on it.   . Provided disease specific education to patient. Evaluation of blood pressure checks.  The patient is not taking blood pressures at home but feels her blood pressures are good. She is compliant with medications regimen. Denies any changes with her blood pressures. . Evaluation of exercise regimen. 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 She does ride the exercise bike at least 2 days a week for 20 minutes.  Encouraged activity.   . Evaluation of questions/concerns related to the COVID vaccines. The patient has had both vaccines and denies any concerns related to the vaccinations.  Nash Dimmer with appropriate clinical care team members regarding patient needs.  The patient denies any needs at this time. Knows the LCSW and Pharmacist are available as needed.  . Education provided on s/s of infection to the toe she injured 2 weeks ago. The patient verbalized that she is watching it closely and will call or come to see the provider for any changes.   Patient Self Care Activities related to HTN, HLD, DMII, and Anxiety:  . Patient is unable to independently self-manage chronic health conditions  Please see past updates related to this goal by clicking on the "Past Updates" button in the selected goal  Patient verbalizes understanding of instructions provided today.   The care management team will reach out to the patient again over the next 60 to 90 days.   Alto Denver RN, MSN, CCM Community Care Coordinator [place]  [facility] Mobile: [phone]

## 2019-07-30 ENCOUNTER — Other Ambulatory Visit: Payer: Self-pay | Admitting: Family Medicine

## 2019-08-02 ENCOUNTER — Other Ambulatory Visit: Payer: Self-pay | Admitting: Family Medicine

## 2019-08-06 ENCOUNTER — Other Ambulatory Visit: Payer: Self-pay | Admitting: Family Medicine

## 2019-09-03 ENCOUNTER — Other Ambulatory Visit: Payer: Self-pay

## 2019-09-03 ENCOUNTER — Encounter: Payer: Self-pay | Admitting: Nurse Practitioner

## 2019-09-03 ENCOUNTER — Ambulatory Visit (INDEPENDENT_AMBULATORY_CARE_PROVIDER_SITE_OTHER): Payer: Medicare Other | Admitting: Nurse Practitioner

## 2019-09-03 VITALS — Ht 69.0 in | Wt 160.0 lb

## 2019-09-03 DIAGNOSIS — J069 Acute upper respiratory infection, unspecified: Secondary | ICD-10-CM

## 2019-09-03 MED ORDER — FLUTICASONE PROPIONATE 50 MCG/ACT NA SUSP
2.0000 | Freq: Every day | NASAL | 6 refills | Status: DC
Start: 2019-09-03 — End: 2020-05-19

## 2019-09-03 NOTE — Patient Instructions (Addendum)

## 2019-09-03 NOTE — Assessment & Plan Note (Signed)
Acute, ongoing.  Likely viral in etiology.  Treat symptomatically with rest, hydration, and conservative management with Coricidin.  Advised COVID testing.  Will start intranasal steroid to help decrease inflammation and open nares.  If not better by end of next week, return to clinic for further treatment.  With any sudden onset of chest pain or shortness of breath, go to ED.

## 2019-09-03 NOTE — Progress Notes (Signed)
Ht 5\' 9"  (1.753 m)    Wt 160 lb (72.6 kg)    BMI 23.63 kg/m    Subjective:    Patient ID: , female    DOB: 1950/05/03, 69 y.o.   MRN: [id number]  HPI: MALENI [name] is a 69 y.o. female presenting with nasal congestion.  Chief Complaint  Patient presents with   Sinusitis    Ongoing 2-3 days   Nasal Congestion   UPPER RESPIRATORY TRACT INFECTION Onset: 2-3 days ago Worst symptom: sinuses; nose stuffed up Fever: no Cough: no Shortness of breath: no Wheezing: no Chest pain: no Chest tightness: no Chest congestion: no Nasal congestion: yes; will not come out Runny nose: no Post nasal drip: yes Sneezing: no Sore throat: no Swollen glands: no Sinus pressure: yes; both cheeks Headache: no Face pain: no Toothache: no Ear pain: no  Ear pressure: no  Eyes red/itching:no Eye drainage/crusting: no  Appetite decreased: no Nausea: no Vomiting: no Rash: no Fatigue: yes Sick contacts: no Strep contacts: no  Context: stable Recurrent sinusitis: no  COVID test: no Relief with OTC cold/cough medications: nothing tried  Treatments attempted: nothing  No Known Allergies  Outpatient Encounter Medications as of 09/03/2019  Medication Sig   citalopram (CELEXA) 40 MG tablet Take 1 tablet (40 mg total) by mouth daily.   cloNIDine (CATAPRES) 0.1 MG tablet Take 1 tablet (0.1 mg total) by mouth daily.   cyclobenzaprine (FLEXERIL) 5 MG tablet Take 1 tablet (5 mg total) by mouth 3 (three) times daily as needed for muscle spasms.   gabapentin (NEURONTIN) 300 MG capsule Take 2 capsules (600 mg total) by mouth at bedtime.   hydrochlorothiazide (HYDRODIURIL) 25 MG tablet Take 1 tablet (25 mg total) by mouth daily.   HYDROcodone-acetaminophen (NORCO/VICODIN) 5-325 MG tablet Take 1-2 tablets by mouth every 4 (four) hours as needed for moderate pain.   hydrOXYzine (VISTARIL) 25 MG capsule Take 1 capsule (25 mg total) by mouth 3 (three) times daily as needed.    metFORMIN (GLUCOPHAGE) 500 MG tablet Take 1 tablet (500 mg total) by mouth 2 (two) times daily with a meal.   pravastatin (PRAVACHOL) 40 MG tablet TAKE 1 TABLET DAILY.   Suvorexant (BELSOMRA) 10 MG TABS Take 10 mg by mouth at bedtime.   fluticasone (FLONASE) 50 MCG/ACT nasal spray Place 2 sprays into both nostrils daily.   No facility-administered encounter medications on file as of 09/03/2019.   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 Interim medical history since our last visit reviewed.  Review of Systems  Constitutional: Positive for fatigue. Negative for activity change, appetite change and fever.  HENT: Positive for postnasal drip and sinus pressure. Negative for congestion, ear discharge, ear pain, rhinorrhea, sinus pain, sneezing, sore throat and trouble swallowing.   Eyes: Negative.  Negative for pain, discharge, redness and itching.  Respiratory: Negative.  Negative for cough, chest tightness, shortness of breath and wheezing.   Cardiovascular: Negative.  Negative for chest pain.  Gastrointestinal: Negative.  Negative for nausea and vomiting.  Genitourinary: Negative.   Skin: Negative.  Negative for rash.  Neurological: Negative.  Negative for headaches.  Psychiatric/Behavioral: Negative.     Per HPI unless specifically indicated above       Objective:    Ht 5\' 9"  (1.753 m)    Wt 160 lb (72.6 kg)    BMI 23.63 kg/m   Wt Readings from Last 3 Encounters:  09/03/19 160 lb (72.6 kg)  05/21/19 162 lb (73.5 kg)  04/28/19 160 lb (72.6 kg)    Physical Exam Physical examination unable to be performed due to lack of equipment.  Results for orders placed or performed in visit on 05/21/19  Comprehensive metabolic panel  Result Value Ref Range   Glucose 120 (H) 65 - 99 mg/dL   BUN 16 8 - 27 mg/dL   Creatinine, Ser 05/23/19 0.57 - 1.00 mg/dL   GFR calc non Af Amer 75 >59 mL/min/1.73   GFR calc Af Amer 86 >59 mL/min/1.73   BUN/Creatinine Ratio 20 12 - 28   Sodium 140 134 - 144 mmol/L   Potassium 4.3 3.5 - 5.2 mmol/L   Chloride 103 96 - 106 mmol/L   CO2 24 20 - 29 mmol/L   Calcium 9.3 8.7 - 10.3 mg/dL   Total Protein 6.7 6.0 - 8.5 g/dL   Albumin 4.6 3.8 - 4.8 g/dL   Globulin, Total 2.1 1.5 - 4.5 g/dL   Albumin/Globulin Ratio 2.2 1.2 - 2.2   Bilirubin Total 0.5 0.0 - 1.2 mg/dL   Alkaline Phosphatase 64 39 - 117 IU/L   AST 24 0 - 40 IU/L   ALT 24 0 - 32 IU/L  Lipid Panel w/o Chol/HDL Ratio  Result Value Ref Range   Cholesterol, Total 163 100 - 199 mg/dL   Triglycerides 9.89 (H) 0 - 149 mg/dL   HDL 42 211 mg/dL   VLDL Cholesterol Cal 26 5 - 40 mg/dL   LDL Chol Calc (NIH) 95 0 - 99 mg/dL  HgB >94  Result Value Ref Range   Hgb A1c MFr Bld 6.0 (H) 4.8 - 5.6 %   Est. average glucose Bld gHb Est-mCnc 126 mg/dL      Assessment & Plan:   Problem List Items Addressed This Visit      Respiratory   Upper respiratory tract infection - Primary    Acute, ongoing.  Likely viral in etiology.  Treat symptomatically with rest, hydration, and conservative management with Coricidin.  Advised COVID testing.  Will start intranasal steroid to help decrease inflammation and open nares.  If not better by end of next week, return to clinic for further treatment.  With any sudden onset of chest pain or shortness of breath, go to ED.           Follow up plan: Return if symptoms worsen or fail to improve.  This visit was completed via telephone due to the restrictions of the COVID-19 pandemic. All issues as above were discussed and addressed but no physical exam was performed. If it was felt that the patient should be evaluated in the office, they were directed there. The patient verbally consented to this visit. Patient was unable to complete an audio/visual visit due to Lack of equipment.  Location of the patient: home  Location of the provider: work  Those involved with this call:  Provider: Mardene Celeste, DNP  CMA: Myrtha Mantis, CMA  Front Desk/Registration: Beverely Pace   Time spent on call: 12 minutes on the phone discussing health concerns. 24 minutes total spent in review of patient's record and preparation of their chart.  I verified patient identity using two factors (patient name and date of birth). Patient consents verbally to being seen via telemedicine visit today.

## 2019-09-06 ENCOUNTER — Other Ambulatory Visit: Payer: Self-pay | Admitting: Family Medicine

## 2019-09-15 ENCOUNTER — Telehealth: Payer: Self-pay | Admitting: General Practice

## 2019-09-15 ENCOUNTER — Ambulatory Visit (INDEPENDENT_AMBULATORY_CARE_PROVIDER_SITE_OTHER): Payer: Medicare Other | Admitting: General Practice

## 2019-09-15 DIAGNOSIS — J069 Acute upper respiratory infection, unspecified: Secondary | ICD-10-CM

## 2019-09-15 DIAGNOSIS — E782 Mixed hyperlipidemia: Secondary | ICD-10-CM

## 2019-09-15 DIAGNOSIS — I10 Essential (primary) hypertension: Secondary | ICD-10-CM

## 2019-09-15 DIAGNOSIS — E119 Type 2 diabetes mellitus without complications: Secondary | ICD-10-CM

## 2019-09-15 DIAGNOSIS — F419 Anxiety disorder, unspecified: Secondary | ICD-10-CM

## 2019-09-15 NOTE — Chronic Care Management (AMB) (Signed)
Chronic Care Management   Follow Up Note   09/15/2019 Name: Marcia [name] MRN: [id number] DOB: [birth year]  Referred by: Particia Nearing, PA-C Reason for referral : Chronic Care Management (RNCM Follow up  for Chronic Disease Management and Care Coordination Needs)   Marcia [name] is a 69 y.o. year old female who is a primary care patient of Particia Nearing, [place]. The CCM team was consulted for assistance with chronic disease management and care coordination needs.    Review of patient status, including review of consultants reports, relevant laboratory and other test results, and collaboration with appropriate care team members and the patient's provider was performed as part of comprehensive patient evaluation and provision of chronic care management services.    SDOH (Social Determinants of Health) assessments performed: Yes See Care Plan activities for detailed interventions related to [facility])     Outpatient Encounter Medications as of 09/15/2019  Medication Sig  . citalopram (CELEXA) 40 MG tablet Take 1 tablet (40 mg total) by mouth daily.  . cloNIDine (CATAPRES) 0.1 MG tablet Take 1 tablet (0.1 mg total) by mouth daily.  . cyclobenzaprine (FLEXERIL) 5 MG tablet Take 1 tablet (5 mg total) by mouth 3 (three) times daily as needed for muscle spasms.  . fluticasone (FLONASE) 50 MCG/ACT nasal spray Place 2 sprays into both nostrils daily.  [name] gabapentin (NEURONTIN) 300 MG capsule Take 2 capsules (600 mg total) by mouth at bedtime.  . hydrochlorothiazide (HYDRODIURIL) 25 MG tablet Take 1 tablet (25 mg total) by mouth daily.  [name] HYDROcodone-acetaminophen (NORCO/VICODIN) 5-325 MG tablet Take 1-2 tablets by mouth every 4 (four) hours as needed for moderate pain.  . hydrOXYzine (VISTARIL) 25 MG capsule Take 1 capsule (25 mg total) by mouth 3 (three) times daily as needed.  . metFORMIN (GLUCOPHAGE) 500 MG tablet Take 1 tablet (500 mg total) by mouth 2 (two) times daily with a meal.   . pravastatin (PRAVACHOL) 40 MG tablet TAKE 1 TABLET DAILY.  . Suvorexant (BELSOMRA) 10 MG TABS Take 10 mg by mouth at bedtime.   No facility-administered encounter medications on file as of 09/15/2019.     Objective:  BP Readings from Last 3 Encounters:  05/21/19 113/69  12/11/18 128/81  12/09/18 134/85   Lab Results  Component Value Date   HGBA1C 6.0 (H) 05/21/2019    Goals Addressed            This Visit's Progress   . RNCM: Pt-"I have been checking my blood sugars too because she said I  had diabetes number 2"       Current Barriers:  . Chronic Disease Management support, education, and care coordination needs related to HTN, HLD, DMII, Anxiety, and Depression . Currently does not have a blood pressure cuff  Clinical Goal(s) related to HTN, HLD, DMII, Anxiety, and Depression:  Over the next 120 days, patient will:  . Work with the care management team to address educational, disease management, and care coordination needs  . Begin or continue self health monitoring activities as directed today Measure and record cbg (blood glucose) BID times daily and Measure and record blood pressure 2 times per week . Call provider office for new or worsened signs and symptoms Blood glucose findings outside established parameters, Blood pressure findings outside established parameters, and New or worsened symptom related to anxiety  . Call care management team with questions or concerns . Verbalize basic understanding of patient centered plan of care established today  Interventions related  to HTN, HLD, DMII, Anxiety, and Depression:  . Evaluation of current treatment plans and patient's adherence to plan as established by provider.  The patient has a good understanding of her chronic conditions. The patient is happy to report that her blood sugars are staying around 108. 09-15-2019: The patient states that her blood sugars are staying down and are in the 100's around 107 and 108.   . Assessed patient understanding of disease states.  The Patient is doing well and denies any new concerns at this time. 09-15-2019: Evaluated for recent contact with the pcp concerning URI. The patient states she is feeling much better and the spray that the pcp prescribed has worked very well. The patient believes it was her sinuses. The patient states she is not having any further issues.  . Assessed patient's education and care coordination needs.  Review of heart healthy/ADA diet. The patient is mindful of her food intake. She has switched over to sugar free products including ice cream, Reece cups, puddings and other sugar free products. Praised for positive lifestyle changes.  The patient verbalized her church was bringing her supper tonight. 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 The patient was helping with the wheelchair and it came down on her big toe. She has been soaking it in epsom salt and using peroxide on it.  09-15-2019: The patient states her toe is doing well. She lost the nail but a new one has started growing. Denies any further issues.  . Provided disease specific education to patient. Evaluation of blood pressure checks.  The patient is not taking blood pressures at home but feels her blood pressures are good. She is compliant with medications regimen. Denies any changes with her blood pressures. . Evaluation of exercise regimen. 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 She does ride the exercise bike at least 2 days a week for 20 minutes.  Encouraged activity.   . Evaluation of questions/concerns related to the COVID vaccines. The patient has had both vaccines and denies any concerns related to the vaccinations.  Steele Sizer with appropriate clinical care team members regarding patient needs.  The patient denies any needs at this time. Knows the LCSW and Pharmacist are  available as needed.  . Evaluation of upcoming appointments. The patient sees the pcp again 11-19-2019. She has an eye appointment coming up to have her eyes checked. She feels she is doing very well. Will continue to monitor.   Patient Self Care Activities related to HTN, HLD, DMII, Anxiety, and Depression:  . Patient is unable to independently self-manage chronic health conditions  Please see past updates related to this goal by clicking on the "Past Updates" button in the selected goal          Plan:   Telephone follow up appointment with care management team member scheduled for: 11-10-2019 at 1:45 pm  Alto Denver RN, MSN, CCM Community Care Coordinator [place]  [facility] Mobile: [phone]

## 2019-09-15 NOTE — Patient Instructions (Signed)
Visit Information  Goals Addressed            This Visit's Progress    RNCM: Pt-"I have been checking my blood sugars too because she said I  had diabetes number 2"       Current Barriers:   Chronic Disease Management support, education, and care coordination needs related to HTN, HLD, DMII, Anxiety, and Depression  Currently does not have a blood pressure cuff  Clinical Goal(s) related to HTN, HLD, DMII, Anxiety, and Depression:  Over the next 120 days, patient will:   Work with the care management team to address educational, disease management, and care coordination needs   Begin or continue self health monitoring activities as directed today Measure and record cbg (blood glucose) BID times daily and Measure and record blood pressure 2 times per week  Call provider office for new or worsened signs and symptoms Blood glucose findings outside established parameters, Blood pressure findings outside established parameters, and New or worsened symptom related to anxiety   Call care management team with questions or concerns  Verbalize basic understanding of patient centered plan of care established today  Interventions related to HTN, HLD, DMII, Anxiety, and Depression:   Evaluation of current treatment plans and patient's adherence to plan as established by provider.  The patient has a good understanding of her chronic conditions. The patient is happy to report that her blood sugars are staying around 108. 09-15-2019: The patient states that her blood sugars are staying down and are in the 100's around 107 and 108.   Assessed patient understanding of disease states.  The Patient is doing well and denies any new concerns at this time. 09-15-2019: Evaluated for recent contact with the pcp concerning URI. The patient states she is feeling much better and the spray that the pcp prescribed has worked very well. The patient believes it was her sinuses. The patient states she is not having any  further issues.   Assessed patient's education and care coordination needs.  Review of heart healthy/ADA diet. The patient is mindful of her food intake. She has switched over to sugar free products including ice cream, Reece cups, puddings and other sugar free products. Praised for positive lifestyle changes.  The patient verbalized her church was bringing her supper tonight. 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 The patient was helping with the wheelchair and it came down on her big toe. She has been soaking it in epsom salt and using peroxide on it.  09-15-2019: The patient states her toe is doing well. She lost the nail but a new one has started growing. Denies any further issues.   Provided disease specific education to patient. Evaluation of blood pressure checks.  The patient is not taking blood pressures at home but feels her blood pressures are good. She is compliant with medications regimen. Denies any changes with her blood pressures.  Evaluation of exercise regimen. 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 She does ride the exercise bike at least 2 days a week for 20 minutes.  Encouraged activity.    Evaluation of questions/concerns related to the COVID vaccines. The patient has had both vaccines and denies any concerns related to the vaccinations.   Collaborated with appropriate clinical care team members regarding patient needs.  The patient denies any needs at this time.  Knows the LCSW and Pharmacist are available as needed.   Evaluation of upcoming appointments. The patient sees the pcp again 11-19-2019. She has an eye appointment coming up to have her eyes checked. She feels she is doing very well. Will continue to monitor.   Patient Self Care Activities related to HTN, HLD, DMII, Anxiety, and Depression:   Patient is unable to independently self-manage chronic health  conditions  Please see past updates related to this goal by clicking on the "Past Updates" button in the selected goal         Patient verbalizes understanding of instructions provided today.   Telephone follow up appointment with care management team member scheduled for: 11-10-2019 at 1:45 pm  Alto Denver RN, MSN, CCM Community Care Coordinator [place]   [facility] Mobile: [phone]

## 2019-10-06 ENCOUNTER — Other Ambulatory Visit: Payer: Self-pay | Admitting: Family Medicine

## 2019-10-09 ENCOUNTER — Other Ambulatory Visit: Payer: Self-pay | Admitting: Nurse Practitioner

## 2019-10-09 NOTE — Telephone Encounter (Signed)
Requested Prescriptions  Pending Prescriptions Disp Refills  . pravastatin (PRAVACHOL) 40 MG tablet [Pharmacy Med Name: PRAVASTATIN SODIUM 40 MG TAB] 90 tablet 2    Sig: TAKE 1 TABLET DAILY.     Cardiovascular:  Antilipid - Statins Failed - 10/09/2019 11:26 AM      Failed - LDL in normal range and within 360 days    LDL Chol Calc (NIH)  Date Value Ref Range Status  05/21/2019 95 0 - 99 mg/dL Final         Failed - Triglycerides in normal range and within 360 days    Triglycerides  Date Value Ref Range Status  05/21/2019 150 (H) 0 - 149 mg/dL Final         Passed - Total Cholesterol in normal range and within 360 days    Cholesterol, Total  Date Value Ref Range Status  05/21/2019 163 100 - 199 mg/dL Final         Passed - HDL in normal range and within 360 days    HDL  Date Value Ref Range Status  05/21/2019 42 >39 mg/dL Final         Passed - Patient is not pregnant      Passed - Valid encounter within last 12 months    Recent Outpatient Visits          1 month ago Upper respiratory tract infection, unspecified type   [facility] Valentino Nose, NP   4 months ago Essential hypertension   [facility] Roosvelt Maser [place], [place]   10 months ago Mixed hyperlipidemia   [facility] [place], Salley Hews, [place]   1 year ago Rash   [facility] [place], [place] Hews, [place]   1 year ago Essential hypertension   [facility] Particia Nearing, [place]      Future Appointments            In 1 month Bradly Bienenstock Guadlupe Spanish, NP [facility]   In 6 months  [facility]

## 2019-10-20 ENCOUNTER — Other Ambulatory Visit: Payer: Self-pay | Admitting: Family Medicine

## 2019-10-20 NOTE — Telephone Encounter (Signed)
Patient's next appointment is 11/19/19. Approved per protocol.  Requested Prescriptions  Pending Prescriptions Disp Refills   hydrOXYzine (VISTARIL) 25 MG capsule [Pharmacy Med Name: HYDROXYZINE PAM 25 MG CAP] 90 capsule 0    Sig: Take 1 capsule (25 mg total) by mouth 3 (three) times daily as needed.     Ear, Nose, and Throat:  Antihistamines Passed - 10/20/2019 10:54 AM      Passed - Valid encounter within last 12 months    Recent Outpatient Visits          1 month ago Upper respiratory tract infection, unspecified type   [facility] Valentino Nose, NP   5 months ago Essential hypertension   [facility] Roosvelt Maser [place], [place]   10 months ago Mixed hyperlipidemia   [facility] Roosvelt Maser [place], [place]   1 year ago Rash   [facility] Particia Nearing, [place]   1 year ago Essential hypertension   [facility] Particia Nearing, [place]      Future Appointments            In 1 month Bradly Bienenstock Guadlupe Spanish, NP [facility]   In 6 months  [facility]

## 2019-10-29 ENCOUNTER — Telehealth: Payer: Self-pay | Admitting: Family Medicine

## 2019-10-29 NOTE — Telephone Encounter (Signed)
Pt called stating that she has been taking Clonidine for her hot flashes. Pt states that she has been taking one tablet, but that it has stopped working for her. She is requesting to have a readjustment on medication. Please advise.       [facility] - [place], [place] - [address]  [address] [place] [address]  Phone: [phone] Fax: [phone]  Hours: Not open 24 hours

## 2019-10-29 NOTE — Telephone Encounter (Signed)
Pt stated that she will discuss this when she comes in on 11/19/2019.

## 2019-10-29 NOTE — Telephone Encounter (Signed)
Would need an appointment with provider in office for adjustments on this.

## 2019-11-08 ENCOUNTER — Other Ambulatory Visit: Payer: Self-pay | Admitting: Family Medicine

## 2019-11-10 ENCOUNTER — Telehealth: Payer: Self-pay | Admitting: General Practice

## 2019-11-10 ENCOUNTER — Ambulatory Visit (INDEPENDENT_AMBULATORY_CARE_PROVIDER_SITE_OTHER): Payer: Medicare Other | Admitting: General Practice

## 2019-11-10 DIAGNOSIS — I10 Essential (primary) hypertension: Secondary | ICD-10-CM | POA: Diagnosis not present

## 2019-11-10 DIAGNOSIS — E782 Mixed hyperlipidemia: Secondary | ICD-10-CM | POA: Diagnosis not present

## 2019-11-10 DIAGNOSIS — E119 Type 2 diabetes mellitus without complications: Secondary | ICD-10-CM | POA: Diagnosis not present

## 2019-11-10 DIAGNOSIS — F419 Anxiety disorder, unspecified: Secondary | ICD-10-CM

## 2019-11-10 NOTE — Chronic Care Management (AMB) (Signed)
Chronic Care Management   Follow Up Note   11/10/2019 Name: Marcia [name] MRN: [id number] DOB: Jun 06, 1950  Referred by: Particia Nearing, PA-C Reason for referral : Chronic Care Management (RNCM Follow up call fro Chronic disease Management and Care Coordination Needs)   Marcia [name] is a 69 y.o. year old female who is a primary care patient of Particia [name], [place]. The CCM team was consulted for assistance with chronic disease management and care coordination needs.    Review of patient status, including review of consultants reports, relevant laboratory and other test results, and collaboration with appropriate care team members and the patient's provider was performed as part of comprehensive patient evaluation and provision of chronic care management services.    SDOH (Social Determinants of Health) assessments performed: Yes See Care Plan activities for detailed interventions related to [facility])     Outpatient Encounter Medications as of 11/10/2019  Medication Sig  . citalopram (CELEXA) 40 MG tablet Take 1 tablet (40 mg total) by mouth daily.  . cloNIDine (CATAPRES) 0.1 MG tablet Take 1 tablet (0.1 mg total) by mouth daily.  . cyclobenzaprine (FLEXERIL) 5 MG tablet Take 1 tablet (5 mg total) by mouth 3 (three) times daily as needed for muscle spasms.  . fluticasone (FLONASE) 50 MCG/ACT nasal spray Place 2 sprays into both nostrils daily.  [name] gabapentin (NEURONTIN) 300 MG capsule Take 2 capsules (600 mg total) by mouth at bedtime.  . hydrochlorothiazide (HYDRODIURIL) 25 MG tablet Take 1 tablet (25 mg total) by mouth daily.  [name] HYDROcodone-acetaminophen (NORCO/VICODIN) 5-325 MG tablet Take 1-2 tablets by mouth every 4 (four) hours as needed for moderate pain.  . hydrOXYzine (VISTARIL) 25 MG capsule Take 1 capsule (25 mg total) by mouth 3 (three) times daily as needed.  . metFORMIN (GLUCOPHAGE) 500 MG tablet Take 1 tablet (500 mg total) by mouth 2 (two) times daily with a  meal.  . pravastatin (PRAVACHOL) 40 MG tablet TAKE 1 TABLET DAILY.  . Suvorexant (BELSOMRA) 10 MG TABS Take 10 mg by mouth at bedtime.   No facility-administered encounter medications on file as of 11/10/2019.     Objective:  BP Readings from Last 3 Encounters:  05/21/19 113/69  12/11/18 128/81  12/09/18 134/85    Goals Addressed            This Visit's Progress   . RNCM: Pt-"I have been checking my blood sugars too because she said I  had diabetes number 2"       Current Barriers:  . Chronic Disease Management support, education, and care coordination needs related to HTN, HLD, DMII, Anxiety, and Depression . Currently does not have a blood pressure cuff  Clinical Goal(s) related to HTN, HLD, DMII, Anxiety, and Depression:  Over the next 120 days, patient will:  . Work with the care management team to address educational, disease management, and care coordination needs  . Begin or continue self health monitoring activities as directed today Measure and record cbg (blood glucose) BID times daily and Measure and record blood pressure 2 times per week . Call provider office for new or worsened signs and symptoms Blood glucose findings outside established parameters, Blood pressure findings outside established parameters, and New or worsened symptom related to anxiety  . Call care management team with questions or concerns . Verbalize basic understanding of patient centered plan of care established today  Interventions related to HTN, HLD, DMII, Anxiety, and Depression:  . Evaluation of current treatment plans  and patient's adherence to plan as established by provider.  The patient has a good understanding of her chronic conditions. The patient is happy to report that her blood sugars are staying around 108. 11-10-2019: The patient states that her blood sugars are staying down and are in the 100's around 107 and 108. The patient states her blood sugars are consistently staying at  around 108.  . Assessed patient understanding of disease states.  The Patient is doing well and denies any new concerns at this time. 11-10-2019: The patient states that she is doing well and has no new concerns with her health at this time. Is thankful for the follow up by the [name].   [name] Assessed patient's education and care coordination needs.  Review of heart healthy/ADA diet. The patient is mindful of her food intake. She has switched over to sugar free products including ice cream, Reece cups, puddings and other sugar free products. Praised for positive lifestyle changes.  The patient verbalized her church was bringing her supper tonight. 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 The patient was helping with the wheelchair and it came down on her big toe. She has been soaking it in epsom salt and using peroxide on it.  09-15-2019: The patient states her toe is doing well. She lost the nail but a new one has started growing. Denies any further issues. 11-10-2019: Her toe nail has grown back now and the patient states she has had no other issues with her toe.  . Provided disease specific education to patient. Evaluation of blood pressure checks.  The patient is not taking blood pressures at home but feels her blood pressures are good. She is compliant with medications regimen. Denies any changes with her blood pressures. . Evaluation of exercise regimen. 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 She does ride the exercise bike at least 2 days a week for 20 minutes.  Encouraged activity.   . Evaluation of questions/concerns related to the COVID vaccines. The patient has had both vaccines and denies any concerns related to the vaccinations. 11-10-2019:The patient states she had a booster shot and her flu shot at [facility] in [place].  She could not remember the date of the booster or the flu vaccine.  Attempts to get the information from [employer] unsuccessful due to extended wait time.  Will attempt to get at another time.  Steele Sizer with appropriate clinical care team members regarding patient needs.  The patient denies any needs at this time. Knows the LCSW and Pharmacist are available as needed.  . Evaluation of upcoming appointments. The patient sees the pcp again 12-03-2019. She has an eye appointment coming up to have her eyes checked. She feels she is doing very well. Will continue to monitor.   Patient Self Care Activities related to HTN, HLD, DMII, Anxiety, and Depression:  . Patient is unable to independently self-manage chronic health conditions  Please see past updates related to this goal by clicking on the "Past Updates" button in the selected goal          Plan:   Telephone follow up appointment with care management team member scheduled for: 01-18-2020 at 1 pm   Alto Denver RN, MSN, CCM Community Care Coordinator [place]  [facility] Mobile: [phone]

## 2019-11-10 NOTE — Patient Instructions (Signed)
Visit Information  Goals Addressed            This Visit's Progress   . RNCM: Pt-"I have been checking my blood sugars too because she said I  had diabetes number 2"       Current Barriers:  . Chronic Disease Management support, education, and care coordination needs related to HTN, HLD, DMII, Anxiety, and Depression . Currently does not have a blood pressure cuff  Clinical Goal(s) related to HTN, HLD, DMII, Anxiety, and Depression:  Over the next 120 days, patient will:  . Work with the care management team to address educational, disease management, and care coordination needs  . Begin or continue self health monitoring activities as directed today Measure and record cbg (blood glucose) BID times daily and Measure and record blood pressure 2 times per week . Call provider office for new or worsened signs and symptoms Blood glucose findings outside established parameters, Blood pressure findings outside established parameters, and New or worsened symptom related to anxiety  . Call care management team with questions or concerns . Verbalize basic understanding of patient centered plan of care established today  Interventions related to HTN, HLD, DMII, Anxiety, and Depression:  . Evaluation of current treatment plans and patient's adherence to plan as established by provider.  The patient has a good understanding of her chronic conditions. The patient is happy to report that her blood sugars are staying around 108. 11-10-2019: The patient states that her blood sugars are staying down and are in the 100's around 107 and 108. The patient states her blood sugars are consistently staying at around 108.  . Assessed patient understanding of disease states.  The Patient is doing well and denies any new concerns at this time. 11-10-2019: The patient states that she is doing well and has no new concerns with her health at this time. Is thankful for the follow up by the [facility].   [name] Assessed patient's  education and care coordination needs.  Review of heart healthy/ADA diet. The patient is mindful of her food intake. She has switched over to sugar free products including ice cream, Reece cups, puddings and other sugar free products. Praised for positive lifestyle changes.  The patient verbalized her church was bringing her supper tonight. 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 The patient was helping with the wheelchair and it came down on her big toe. She has been soaking it in epsom salt and using peroxide on it.  09-15-2019: The patient states her toe is doing well. She lost the nail but a new one has started growing. Denies any further issues. 11-10-2019: Her toe nail has grown back now and the patient states she has had no other issues with her toe.  . Provided disease specific education to patient. Evaluation of blood pressure checks.  The patient is not taking blood pressures at home but feels her blood pressures are good. She is compliant with medications regimen. Denies any changes with her blood pressures. . Evaluation of exercise regimen. 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 She does ride the exercise bike at least 2 days a week for 20 minutes.  Encouraged activity.   . Evaluation of questions/concerns related to the COVID vaccines. The patient has had both vaccines and denies any concerns related to the vaccinations. 11-10-2019:The patient states she  had a booster shot and her flu shot at [facility] in [place].  She could not remember the date of the booster or the flu vaccine. Attempts to get the information from [employer] unsuccessful due to extended wait time.  Will attempt to get at another time.  Steele Sizer with appropriate clinical care team members regarding patient needs.  The patient denies any needs at this time. Knows the LCSW and Pharmacist are available as needed.   . Evaluation of upcoming appointments. The patient sees the pcp again 12-03-2019. She has an eye appointment coming up to have her eyes checked. She feels she is doing very well. Will continue to monitor.   Patient Self Care Activities related to HTN, HLD, DMII, Anxiety, and Depression:  . Patient is unable to independently self-manage chronic health conditions  Please see past updates related to this goal by clicking on the "Past Updates" button in the selected goal         Patient verbalizes understanding of instructions provided today.   Telephone follow up appointment with care management team member scheduled for: 01-18-2020 at 1 pm  Alto Denver RN, MSN, CCM Community Care Coordinator [place]  [facility] Mobile: [phone]

## 2019-11-19 ENCOUNTER — Encounter: Payer: Medicare Other | Admitting: Nurse Practitioner

## 2019-12-03 ENCOUNTER — Encounter: Payer: Medicare Other | Admitting: Nurse Practitioner

## 2019-12-08 ENCOUNTER — Other Ambulatory Visit: Payer: Self-pay | Admitting: Family Medicine

## 2019-12-08 NOTE — Telephone Encounter (Signed)
See message below °

## 2019-12-08 NOTE — Telephone Encounter (Signed)
   Notes to clinic:  Patient has appointment on Friday  Review for fill    Requested Prescriptions  Pending Prescriptions Disp Refills   cloNIDine (CATAPRES) 0.1 MG tablet [Pharmacy Med Name: CLONIDINE HCL 0.1 MG TABLET] 90 tablet 0    Sig: Take 1 tablet (0.1 mg total) by mouth daily.      Cardiovascular:  Alpha-2 Agonists Failed - 12/08/2019  9:55 AM      Failed - Valid encounter within last 6 months    Recent Outpatient Visits           3 months ago Upper respiratory tract infection, unspecified type   [facility] Valentino Nose, NP   6 months ago Essential hypertension   [facility] Roosvelt Maser [place], [place]   12 months ago Mixed hyperlipidemia   [facility] Roosvelt Maser [place], [place]   1 year ago Rash   [facility] Particia Nearing, [place]   1 year ago Essential hypertension   [facility] Particia Nearing, [place]       Future Appointments             In 2 days Valentino Nose, NP [facility]   In 4 months  [facility]            Passed - Last BP in normal range    BP Readings from Last 1 Encounters:  05/21/19 113/69          Passed - Last Heart Rate in normal range    Pulse Readings from Last 1 Encounters:  05/21/19 78

## 2019-12-10 ENCOUNTER — Other Ambulatory Visit: Payer: Self-pay

## 2019-12-10 ENCOUNTER — Ambulatory Visit (INDEPENDENT_AMBULATORY_CARE_PROVIDER_SITE_OTHER): Payer: Medicare Other | Admitting: Nurse Practitioner

## 2019-12-10 ENCOUNTER — Encounter: Payer: Self-pay | Admitting: Nurse Practitioner

## 2019-12-10 VITALS — BP 125/65 | HR 82 | Temp 98.4°F | Ht 67.1 in | Wt 157.0 lb

## 2019-12-10 DIAGNOSIS — M8580 Other specified disorders of bone density and structure, unspecified site: Secondary | ICD-10-CM

## 2019-12-10 DIAGNOSIS — F419 Anxiety disorder, unspecified: Secondary | ICD-10-CM | POA: Diagnosis not present

## 2019-12-10 DIAGNOSIS — Z1329 Encounter for screening for other suspected endocrine disorder: Secondary | ICD-10-CM

## 2019-12-10 DIAGNOSIS — Z0000 Encounter for general adult medical examination without abnormal findings: Secondary | ICD-10-CM

## 2019-12-10 DIAGNOSIS — E119 Type 2 diabetes mellitus without complications: Secondary | ICD-10-CM | POA: Diagnosis not present

## 2019-12-10 DIAGNOSIS — Z1231 Encounter for screening mammogram for malignant neoplasm of breast: Secondary | ICD-10-CM | POA: Diagnosis not present

## 2019-12-10 DIAGNOSIS — G4700 Insomnia, unspecified: Secondary | ICD-10-CM

## 2019-12-10 DIAGNOSIS — E559 Vitamin D deficiency, unspecified: Secondary | ICD-10-CM | POA: Diagnosis not present

## 2019-12-10 DIAGNOSIS — E782 Mixed hyperlipidemia: Secondary | ICD-10-CM | POA: Diagnosis not present

## 2019-12-10 DIAGNOSIS — E1140 Type 2 diabetes mellitus with diabetic neuropathy, unspecified: Secondary | ICD-10-CM

## 2019-12-10 DIAGNOSIS — G8929 Other chronic pain: Secondary | ICD-10-CM

## 2019-12-10 DIAGNOSIS — I10 Essential (primary) hypertension: Secondary | ICD-10-CM | POA: Diagnosis not present

## 2019-12-10 DIAGNOSIS — M545 Low back pain, unspecified: Secondary | ICD-10-CM

## 2019-12-10 MED ORDER — BELSOMRA 10 MG PO TABS: 10.0000 mg | ORAL_TABLET | Freq: Every day | ORAL | 5 refills | Status: DC

## 2019-12-10 MED ORDER — TIZANIDINE HCL 4 MG PO TABS: 4.0000 mg | ORAL_TABLET | Freq: Every evening | ORAL | 0 refills | Status: DC | PRN

## 2019-12-10 MED ORDER — PRAVASTATIN SODIUM 40 MG PO TABS: 40.0000 mg | ORAL_TABLET | Freq: Every day | ORAL | 1 refills | Status: DC

## 2019-12-10 MED ORDER — GABAPENTIN 300 MG PO CAPS: 300.0000 mg | ORAL_CAPSULE | Freq: Two times a day (BID) | ORAL | 1 refills | Status: DC

## 2019-12-10 MED ORDER — CLONIDINE HCL 0.1 MG PO TABS: 0.1000 mg | ORAL_TABLET | Freq: Every day | ORAL | 1 refills | Status: DC

## 2019-12-10 MED ORDER — HYDROCHLOROTHIAZIDE 25 MG PO TABS: 25.0000 mg | ORAL_TABLET | Freq: Every day | ORAL | 1 refills | Status: DC

## 2019-12-10 MED ORDER — METFORMIN HCL 500 MG PO TABS: 500.0000 mg | ORAL_TABLET | Freq: Two times a day (BID) | ORAL | 1 refills | Status: DC

## 2019-12-10 MED ORDER — SERTRALINE HCL 50 MG PO TABS: 50.0000 mg | ORAL_TABLET | Freq: Every day | ORAL | 1 refills | Status: DC

## 2019-12-10 NOTE — Patient Instructions (Signed)
Thayer Headings, Call: [facility] at [facility]  Address: [address] [place], [place], [place] [address]  Phone: [phone] To schedule your mammogram.  Acute Back Pain, Adult Acute back pain is sudden and usually short-lived. It is often caused by an injury to the muscles and tissues in the back. The injury may result from:  A muscle or ligament getting overstretched or torn (strained). Ligaments are tissues that connect bones to each other. Lifting something improperly can cause a back strain.  Wear and tear (degeneration) of the spinal disks. Spinal disks are circular tissue that provides cushioning between the bones of the spine (vertebrae).  Twisting motions, such as while playing sports or doing yard work.  A hit to the back.  Arthritis. You may have a physical exam, lab tests, and imaging tests to find the cause of your pain. Acute back pain usually goes away with rest and home care. Follow these instructions at home: Managing pain, stiffness, and swelling  Take over-the-counter and prescription medicines only as told by your health care provider.  Your health care provider may recommend applying ice during the first 24-48 hours after your pain starts. To do this: ? Put ice in a plastic bag. ? Place a towel between your skin and the bag. ? Leave the ice on for 20 minutes, 2-3 times a day.  If directed, apply heat to the affected area as often as told by your health care provider. Use the heat source that your health care provider recommends, such as a moist heat pack or a heating pad. ? Place a towel between your skin and the heat source. ? Leave the heat on for 20-30 minutes. ? Remove the heat if your skin turns bright red. This is especially important if you are unable to feel pain, heat, or cold. You have a greater risk of getting burned. Activity   Do not stay in bed. Staying in bed for more than 1-2 days can delay your recovery.  Sit up and stand up  straight. Avoid leaning forward when you sit, or hunching over when you stand. ? If you work at a desk, sit close to it so you do not need to lean over. Keep your chin tucked in. Keep your neck drawn back, and keep your elbows bent at a right angle. Your arms should look like the letter "L." ? Sit high and close to the steering wheel when you drive. Add lower back (lumbar) support to your car seat, if needed.  Take short walks on even surfaces as soon as you are able. Try to increase the length of time you walk each day.  Do not sit, drive, or stand in one place for more than 30 minutes at a time. Sitting or standing for long periods of time can put stress on your back.  Do not drive or use heavy machinery while taking prescription pain medicine.  Use proper lifting techniques. When you bend and lift, use positions that put less stress on your back: ? [place] your knees. ? Keep the load close to your body. ? Avoid twisting.  Exercise regularly as told by your health care provider. Exercising helps your back heal faster and helps prevent back injuries by keeping muscles strong and flexible.  Work with a physical therapist to make a safe exercise program, as recommended by your health care provider. Do any exercises as told by your physical therapist. Lifestyle  Maintain a healthy weight. Extra weight puts stress on  your back and makes it difficult to have good posture.  Avoid activities or situations that make you feel anxious or stressed. Stress and anxiety increase muscle tension and can make back pain worse. Learn ways to manage anxiety and stress, such as through exercise. General instructions  Sleep on a firm mattress in a comfortable position. Try lying on your side with your knees slightly bent. If you lie on your back, put a pillow under your knees.  Follow your treatment plan as told by your health care provider. This may include: ? Cognitive or behavioral therapy. ? Acupuncture or  massage therapy. ? Meditation or yoga. Contact a health care provider if:  You have pain that is not relieved with rest or medicine.  You have increasing pain going down into your legs or buttocks.  Your pain does not improve after 2 weeks.  You have pain at night.  You lose weight without trying.  You have a fever or chills. Get help right away if:  You develop new bowel or bladder control problems.  You have unusual weakness or numbness in your arms or legs.  You develop nausea or vomiting.  You develop abdominal pain.  You feel faint. Summary  Acute back pain is sudden and usually short-lived.  Use proper lifting techniques. When you bend and lift, use positions that put less stress on your back.  Take over-the-counter and prescription medicines and apply heat or ice as directed by your health care provider. This information is not intended to replace advice given to you by your health care provider. Make sure you discuss any questions you have with your health care provider. Document Revised: 04/28/2018 Document Reviewed: 08/21/2016 Elsevier Patient Education  [place] 65 Years and Older, Female Preventive care refers to lifestyle choices and visits with your health care provider that can promote health and wellness. This includes:  A yearly physical exam. This is also called an annual well check.  Regular dental and eye exams.  Immunizations.  Screening for certain conditions.  Healthy lifestyle choices, such as diet and exercise. What can I expect for my preventive care visit? Physical exam Your health care provider will check:  Height and weight. These may be used to calculate body mass index (BMI), which is a measurement that tells if you are at a healthy weight.  Heart rate and blood pressure.  Your skin for abnormal spots. Counseling Your health care provider may ask you questions about:  Alcohol, tobacco, and drug  use.  Emotional well-being.  Home and relationship well-being.  Sexual activity.  Eating habits.  History of falls.  Memory and ability to understand (cognition).  Work and work Statistician.  Pregnancy and menstrual history. What immunizations do I need?  Influenza (flu) vaccine  This is recommended every year. Tetanus, diphtheria, and pertussis (Tdap) vaccine  You may need a Td booster every 10 years. Varicella (chickenpox) vaccine  You may need this vaccine if you have not already been vaccinated. Zoster (shingles) vaccine  You may need this after age 77. Pneumococcal conjugate (PCV13) vaccine  One dose is recommended after age 29. Pneumococcal polysaccharide (PPSV23) vaccine  One dose is recommended after age 85. Measles, mumps, and rubella (MMR) vaccine  You may need at least one dose of MMR if you were born in 1957 or later. You may also need a second dose. Meningococcal conjugate (MenACWY) vaccine  You may need this if you have certain conditions. Hepatitis A vaccine  You may need this if you have certain conditions or if you travel or work in places where you may be exposed to hepatitis A. Hepatitis B vaccine  You may need this if you have certain conditions or if you travel or work in places where you may be exposed to hepatitis B. Haemophilus influenzae type b (Hib) vaccine  You may need this if you have certain conditions. You may receive vaccines as individual doses or as more than one vaccine together in one shot (combination vaccines). Talk with your health care provider about the risks and benefits of combination vaccines. What tests do I need? Blood tests  Lipid and cholesterol levels. These may be checked every 5 years, or more frequently depending on your overall health.  Hepatitis C test.  Hepatitis B test. Screening  Lung cancer screening. You may have this screening every year starting at age 73 if you have a 30-pack-year history of  smoking and currently smoke or have quit within the past 15 years.  Colorectal cancer screening. All adults should have this screening starting at age 68 and continuing until age 68. Your health care provider may recommend screening at age 34 if you are at increased risk. You will have tests every 1-10 years, depending on your results and the type of screening test.  Diabetes screening. This is done by checking your blood sugar (glucose) after you have not eaten for a while (fasting). You may have this done every 1-3 years.  Mammogram. This may be done every 1-2 years. Talk with your health care provider about how often you should have regular mammograms.  BRCA-related cancer screening. This may be done if you have a family history of breast, ovarian, tubal, or peritoneal cancers. Other tests  Sexually transmitted disease (STD) testing.  Bone density scan. This is done to screen for osteoporosis. You may have this done starting at age 3. Follow these instructions at home: Eating and drinking  Eat a diet that includes fresh fruits and vegetables, whole grains, lean protein, and low-fat dairy products. Limit your intake of foods with high amounts of sugar, saturated fats, and salt.  Take vitamin and mineral supplements as recommended by your health care provider.  Do not drink alcohol if your health care provider tells you not to drink.  If you drink alcohol: ? Limit how much you have to 0-1 drink a day. ? Be aware of how much alcohol is in your drink. In the [place] one drink equals one 12 oz bottle of beer (355 mL), one 5 oz glass of wine (148 mL), or one 1 oz glass of hard liquor (44 mL). Lifestyle  Take daily care of your teeth and gums.  Stay active. Exercise for at least 30 minutes on 5 or more days each week.  Do not use any products that contain nicotine or tobacco, such as cigarettes, e-cigarettes, and chewing tobacco. If you need help quitting, ask your health care  provider.  If you are sexually active, practice safe sex. Use a condom or other form of protection in order to prevent STIs (sexually transmitted infections).  Talk with your health care provider about taking a low-dose aspirin or statin. What's next?  Go to your health care provider once a year for a well check visit.  Ask your health care provider how often you should have your eyes and teeth checked.  Stay up to date on all vaccines. This information is not intended to replace advice given to you  by your health care provider. Make sure you discuss any questions you have with your health care provider. Document Revised: 01/01/2018 Document Reviewed: 01/01/2018 Elsevier Patient Education  2020 [employer].

## 2019-12-10 NOTE — Progress Notes (Signed)
BP 125/65   Pulse 82   Temp 98.4 F (36.9 C) (Oral)   Ht 5' 7.1" (1.704 m)   Wt 157 lb (71.2 kg)   SpO2 96%   BMI 24.52 kg/m    Subjective:    Patient ID: Marcia [name], female    DOB: 06/05/1950, 69 y.o.   MRN: [id number]  HPI: XYLAH EARLY is a 69 y.o. female presenting on 12/10/2019 for comprehensive medical examination. Current medical complaints include:  HYPERTENSION / HYPERLIPIDEMIA Satisfied with current treatment? yes Duration of hypertension: chronic BP monitoring frequency: not checking BP medication side effects: no Past BP meds: HCTZ 25, clonidine 0.1mg  Duration of hyperlipidemia: chronic Cholesterol medication side effects: no Cholesterol supplements: none Past cholesterol medications: pravastatin 40 mg Aspirin: no Recent stressors: no Recurrent headaches: no Visual changes: no Palpitations: no Dyspnea: no Chest pain: no Lower extremity edema: no Dizzy/lightheaded: no  DIABETES Wants to stay on Metformin because it helps her not eat a bunch of sweets.  Hypoglycemic episodes:no Polydipsia/polyuria: no Visual disturbance: no Chest pain: no Paresthesias: no Glucose Monitoring: no Blood Pressure Monitoring: not checking Retinal Examination: not up to date; is planning to call and schedule soon Foot Exam: due today Diabetic Education: Completed Pneumovax: Up to Date Influenza: Up to Date Aspirin: no  BACK PAIN Duration: days Mechanism of injury: Location: low, mid Onset: sudden Severity: severe Quality: shooting Frequency:constant Radiation: none Aggravating factors: sitting for a while Alleviating factors: moving, heating pad Status: stable  Treatments attempted:  Bayer full body and back ache;  Relief with NSAIDs?: no Nighttime pain:  no Paresthesias / decreased sensation:  no Bowel / bladder incontinence:  no Fevers:  no Dysuria / urinary frequency:  no  DEPRESSION Mood status: uncontrolled Satisfied with current treatment?:  yes Symptom severity: moderate  Duration of current treatment : chronic Side effects: no Medication compliance: excellent compliance Psychotherapy/counseling: no Previous psychiatric medications: citalopram 40 mg, taking hydroxyzine multiple times per day Depressed mood: yes Anxious mood: no Anhedonia: yes Significant weight loss or gain: no Insomnia: no Fatigue: yes Feelings of worthlessness or guilt: no Impaired concentration/indecisiveness: no Suicidal ideations: no Hopelessness: no Crying spells: no Depression screen [facility] 2/9 12/10/2019 05/21/2019 05/04/2019 04/28/2019 12/11/2018  Decreased Interest 0 0 0 0 0  Down, Depressed, Hopeless 0 0 0 0 0  PHQ - 2 Score 0 0 0 0 0  Altered sleeping 0 0 - - 1  Tired, decreased energy 0 0 - - 0  Change in appetite 0 0 - - 0  Feeling bad or failure about yourself  0 0 - - 0  Trouble concentrating 0 0 - - 0  Moving slowly or fidgety/restless 0 0 - - 0  Suicidal thoughts 0 0 - - 0  PHQ-9 Score 0 0 - - 1  Difficult doing work/chores Not difficult at all - - - -    GAD 7 : Generalized Anxiety Score 12/10/2019 05/21/2019 06/03/2018 12/03/2017  Nervous, Anxious, on Edge 0 0 0 0  Control/stop worrying 0 0 0 0  Worry too much - different things 0 0 0 0  Trouble relaxing 0 0 0 0  Restless 0 0 0 0  Easily annoyed or irritable 0 0 0 0  Afraid - awful might happen 0 0 0 0  Total GAD 7 Score 0 0 0 0  Anxiety Difficulty Not difficult at all Not difficult at all Not difficult at all -   INSOMNIA Stable with Belsomra 10 mg nightly.  Duration: chronic Satisfied with sleep quality: yes Difficulty falling asleep: no Difficulty staying asleep: no Waking a few hours after sleep onset: no Early morning awakenings: no Daytime hypersomnolence: no Wakes feeling refreshed: yes Good sleep hygiene: yes Apnea: no Snoring: no Depressed/anxious mood: yes Recent stress: no Restless legs/nocturnal leg cramps: no Chronic pain/arthritis: yes History of sleep  study: no Treatments attempted: Belsomra  She currently lives with: self Menopausal Symptoms: no  Depression Screen done today and results listed below:  Depression screen [facility] 2/9 12/10/2019 05/21/2019 05/04/2019 04/28/2019 12/11/2018  Decreased Interest 0 0 0 0 0  Down, Depressed, Hopeless 0 0 0 0 0  PHQ - 2 Score 0 0 0 0 0  Altered sleeping 0 0 - - 1  Tired, decreased energy 0 0 - - 0  Change in appetite 0 0 - - 0  Feeling bad or failure about yourself  0 0 - - 0  Trouble concentrating 0 0 - - 0  Moving slowly or fidgety/restless 0 0 - - 0  Suicidal thoughts 0 0 - - 0  PHQ-9 Score 0 0 - - 1  Difficult doing work/chores Not difficult at all - - - -    The patient does not have a history of falls. I did not complete a risk assessment for falls. A plan of care for falls was not documented.   Past Medical History:  Past Medical History:  Diagnosis Date  . Anxiety   . Colon cancer screening   . Depression   . Hyperlipidemia   . Hypertension   . Perforated bowel (HCC) 12/06/2017    Surgical History:  Past Surgical History:  Procedure Laterality Date  . BREAST BIOPSY Left 2015   CORE W/CLIP - NEG  . BREAST BIOPSY Left 07/2007   neg bx   . CESAREAN SECTION    . COLON SURGERY    . COLONOSCOPY WITH PROPOFOL N/A 02/18/2018   Procedure: COLONOSCOPY WITH PROPOFOL;  Surgeon: Wyline Mood, MD;  Location: [facility] ENDOSCOPY;  Service: Gastroenterology;  Laterality: N/A;  . COLONOSCOPY WITH PROPOFOL N/A 03/25/2018   Procedure: COLONOSCOPY WITH PROPOFOL;  Surgeon: Wyline Mood, MD;  Location: [facility] ENDOSCOPY;  Service: Gastroenterology;  Laterality: N/A;  . COLONOSCOPY WITH PROPOFOL N/A 08/25/2018   Procedure: COLONOSCOPY WITH PROPOFOL;  Surgeon: Toney Reil, MD;  Location: [facility] ENDOSCOPY;  Service: Gastroenterology;  Laterality: N/A;  . LAPAROTOMY N/A 12/06/2017   Procedure: EXPLORATORY LAPAROTOMY, sigmoid colectomy, anastomosis;  Surgeon: Ancil Linsey, MD;  Location: [facility] ORS;   Service: General;  Laterality: N/A;  . TUBAL LIGATION    . XI ROBOTIC ASSISTED VENTRAL HERNIA N/A 11/26/2018   Procedure: XI ROBOTIC ASSISTED VENTRAL HERNIA;  Surgeon: Leafy Ro, MD;  Location: [facility] ORS;  Service: General;  Laterality: N/A;   Outpatient Encounter Medications as of 12/10/2019  Medication Sig  . cloNIDine (CATAPRES) 0.1 MG tablet Take 1 tablet (0.1 mg total) by mouth daily.  . fluticasone (FLONASE) 50 MCG/ACT nasal spray Place 2 sprays into both nostrils daily.  [name] gabapentin (NEURONTIN) 300 MG capsule Take 1 capsule (300 mg total) by mouth 2 (two) times daily.  . hydrochlorothiazide (HYDRODIURIL) 25 MG tablet Take 1 tablet (25 mg total) by mouth daily.  . hydrOXYzine (VISTARIL) 25 MG capsule Take 1 capsule (25 mg total) by mouth 3 (three) times daily as needed.  . metFORMIN (GLUCOPHAGE) 500 MG tablet Take 1 tablet (500 mg total) by mouth 2 (two) times daily with a meal.  . pravastatin (  PRAVACHOL) 40 MG tablet Take 1 tablet (40 mg total) by mouth daily.  . Suvorexant (BELSOMRA) 10 MG TABS Take 10 mg by mouth at bedtime.  . [DISCONTINUED] citalopram (CELEXA) 40 MG tablet Take 1 tablet (40 mg total) by mouth daily.  . [DISCONTINUED] cloNIDine (CATAPRES) 0.1 MG tablet Take 1 tablet (0.1 mg total) by mouth daily.  . [DISCONTINUED] cyclobenzaprine (FLEXERIL) 5 MG tablet Take 1 tablet (5 mg total) by mouth 3 (three) times daily as needed for muscle spasms.  . [DISCONTINUED] gabapentin (NEURONTIN) 300 MG capsule Take 2 capsules (600 mg total) by mouth at bedtime.  . [DISCONTINUED] hydrochlorothiazide (HYDRODIURIL) 25 MG tablet Take 1 tablet (25 mg total) by mouth daily.  . [DISCONTINUED] metFORMIN (GLUCOPHAGE) 500 MG tablet Take 1 tablet (500 mg total) by mouth 2 (two) times daily with a meal.  . [DISCONTINUED] pravastatin (PRAVACHOL) 40 MG tablet TAKE 1 TABLET DAILY.  . [DISCONTINUED] Suvorexant (BELSOMRA) 10 MG TABS Take 10 mg by mouth at bedtime.  . sertraline (ZOLOFT) 50 MG  tablet Take 1 tablet (50 mg total) by mouth daily.  [name] tiZANidine (ZANAFLEX) 4 MG tablet Take 1 tablet (4 mg total) by mouth at bedtime as needed for muscle spasms.  . [DISCONTINUED] HYDROcodone-acetaminophen (NORCO/VICODIN) 5-325 MG tablet Take 1-2 tablets by mouth every 4 (four) hours as needed for moderate pain.   No facility-administered encounter medications on file as of 12/10/2019.    Allergies:  No Known Allergies  Social History:  Social History   Socioeconomic History  . Marital status: Divorced    Spouse name: Not on file  . Number of children: Not on file  . Years of education: Not on file  . Highest education level: High school graduate  Occupational History  . Not on file  Tobacco Use  . Smoking status: Former Smoker    Types: Cigarettes    Quit date: 11/18/2012    Years since quitting: 7.0  . Smokeless tobacco: Never Used  . Tobacco comment: quit over 15 years ago   Vaping Use  . Vaping Use: Every day  Substance and Sexual Activity  . Alcohol use: No  . Drug use: No  . Sexual activity: Not Currently  Other Topics Concern  . 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 Difficulty of Paying Living Expenses: Not hard at all  Food Insecurity: No Food Insecurity  . Worried About Programme researcher, broadcasting/film/video in the Last Year: Never true  . Ran Out of Food in the Last Year: Never true  Transportation Needs: No Transportation Needs  . Lack of Transportation (Medical): No  . Lack of Transportation (Non-Medical): No  Physical Activity: Insufficiently Active  . Days of Exercise per Week: 2 days  . Minutes of Exercise per Session: 20 min  Stress: No Stress Concern Present  . Feeling of Stress : Not at all  Social Connections: Moderately Integrated  . Frequency of Communication  with Friends and Family: More than three times a week  . Frequency of Social Gatherings with Friends and Family: More than three times a week  . Attends Religious Services: More than 4 times per year  . Active Member of Clubs or Organizations: Yes  . Attends Club  or Organization Meetings: More than 4 times per year  . Marital Status: Divorced  Catering manager Violence: Not At Risk  . Fear of Current or Ex-Partner: No  . Emotionally Abused: No  . Physically Abused: No  . Sexually Abused: No   Social History   Tobacco Use  Smoking Status Former Smoker  . Types: Cigarettes  . Quit date: 11/18/2012  . Years since quitting: 7.0  Smokeless Tobacco Never Used  Tobacco Comment   quit over 15 years ago    Social History   Substance and Sexual Activity  Alcohol Use No    Family History:  Family History  Problem Relation Age of Onset  . Breast cancer Mother   . Liver cancer Sister   . Pancreatic cancer Sister   . Stomach cancer Brother   . Lung cancer Brother   . Brain cancer Sister   . Diabetes Father   . Heart disease Father   . Breast cancer Sister   . Heart attack Brother   . COPD Neg Hx   . Stroke Neg Hx     Past medical history, surgical history, medications, allergies, family history and social history reviewed with patient today and changes made to appropriate areas of the chart.   Review of Systems  Constitutional: Negative.  Negative for chills, diaphoresis, fever, malaise/fatigue and weight loss.  HENT: Negative.  Negative for ear discharge, ear pain, hearing loss, sinus pain and sore throat.   Eyes: Negative.  Negative for blurred vision and double vision.  Respiratory: Negative.  Negative for cough, shortness of breath and wheezing.   Cardiovascular: Negative.  Negative for chest pain, palpitations and leg swelling.  Gastrointestinal: Negative.  Negative for abdominal pain, constipation, nausea and vomiting.  Genitourinary: Negative.  Negative for dysuria and  flank pain.  Musculoskeletal: Positive for back pain.  Skin: Negative.  Negative for itching and rash.  Neurological: Negative.  Negative for dizziness, weakness and headaches.  Psychiatric/Behavioral: Positive for depression. Negative for memory loss and suicidal ideas. The patient is not nervous/anxious and does not have insomnia.     All other ROS negative except what is listed above and in the HPI.      Objective:    BP 125/65   Pulse 82   Temp 98.4 F (36.9 C) (Oral)   Ht 5' 7.1" (1.704 m)   Wt 157 lb (71.2 kg)   SpO2 96%   BMI 24.52 kg/m   Wt Readings from Last 3 Encounters:  12/10/19 157 lb (71.2 kg)  09/03/19 160 lb (72.6 kg)  05/21/19 162 lb (73.5 kg)    Physical Exam Vitals and nursing note reviewed. Exam conducted with a chaperone present.  Constitutional:      General: She is not in acute distress.    Appearance: Normal appearance. She is normal weight. She is not ill-appearing or toxic-appearing.  HENT:     Head: Normocephalic and atraumatic.     Right Ear: Tympanic membrane, ear canal and external ear normal.     Left Ear: Tympanic membrane, ear canal and external ear normal.     Nose: Nose normal. No congestion or rhinorrhea.     Mouth/Throat:     Mouth: Mucous membranes are moist.     Pharynx: Oropharynx is clear. No oropharyngeal exudate.  Eyes:     General: No scleral icterus.       Right eye: No discharge.        Left eye: No discharge.  Extraocular Movements: Extraocular movements intact.     Pupils: Pupils are equal, round, and reactive to light.  Cardiovascular:     Rate and Rhythm: Normal rate and regular rhythm.     Pulses: Normal pulses.     Heart sounds: Normal heart sounds. No murmur heard.   Pulmonary:     Effort: Pulmonary effort is normal. No respiratory distress.     Breath sounds: No wheezing or rhonchi.  Chest:     Comments: Breast exam deferred due to back pain. Abdominal:     General: Abdomen is flat. Bowel sounds are  normal. There is no distension.     Palpations: Abdomen is soft.     Tenderness: There is no abdominal tenderness.  Musculoskeletal:        General: No swelling. Normal range of motion.     Cervical back: Normal range of motion and neck supple. No rigidity or tenderness.     Lumbar back: Tenderness and bony tenderness present. No swelling or edema. Normal range of motion. Negative right straight leg raise test and negative left straight leg raise test.     Right lower leg: No edema.     Left lower leg: No edema.  Skin:    General: Skin is warm and dry.     Capillary Refill: Capillary refill takes less than 2 seconds.     Coloration: Skin is not jaundiced or pale.     Findings: No erythema or rash.  Neurological:     General: No focal deficit present.     Mental Status: She is alert and oriented to person, place, and time.     Motor: No weakness.     Gait: Gait normal.  Psychiatric:        Mood and Affect: Mood normal.        Behavior: Behavior normal.        Thought Content: Thought content normal.        Judgment: Judgment normal.       Assessment & Plan:   Problem List Items Addressed This Visit      Cardiovascular and Mediastinum   Hypertension    Chronic, stable.  BP at goal today in office.  Will continue clonidine 0.1 mg daily, hydrochlorothiazide 25 mg daily.  CBC and CMP today.  Encouraged patient to check blood pressure at home and notify clinic if greater than 140/90 consistently.  Follow-up in 6 months.      Relevant Medications   pravastatin (PRAVACHOL) 40 MG tablet   cloNIDine (CATAPRES) 0.1 MG tablet   hydrochlorothiazide (HYDRODIURIL) 25 MG tablet   Other Relevant Orders   CBC with Differential/Platelet (Completed)   Comprehensive metabolic panel (Completed)     Endocrine   Type 2 diabetes mellitus without complication, without long-term current use of insulin (HCC)    Chronic, previously stable.  We will continue Metformin 500 mg twice daily with meals  for now.  Also continue gabapentin 300 mg twice daily for neuropathy.  Hemoglobin A1c, foot exam done today.  Patient will reach out to ophthalmologist for retinopathy examination.  Follow-up in 6 months.      Relevant Medications   pravastatin (PRAVACHOL) 40 MG tablet   metFORMIN (GLUCOPHAGE) 500 MG tablet     Musculoskeletal and Integument   Osteopenia    History of osteopenia, will check vitamin D level today and treat as needed.  Up-to-date on DEXA scan.  Follow-up 6 months.      Relevant Orders  VITAMIN D 25 Hydroxy (Vit-D Deficiency, Fractures) (Completed)     Other   Hyperlipidemia    Chronic, ongoing.  We will continue pravastatin 40 mg daily for now and check lipids, CMP today.  Will adjust medication as indicated.  Follow-up in 6 months.      Relevant Medications   pravastatin (PRAVACHOL) 40 MG tablet   cloNIDine (CATAPRES) 0.1 MG tablet   hydrochlorothiazide (HYDRODIURIL) 25 MG tablet   Other Relevant Orders   Lipid Panel w/o Chol/HDL Ratio (Completed)   CBC with Differential/Platelet (Completed)   Comprehensive metabolic panel (Completed)   Anxiety    Chronic, exacerbated currently due to stressors.  However, PHQ-9 and GAD-7 both 0 today.  No SI/HI.  Given reports of taking hydroxyzine more than 3 times daily to help with depressive symptoms, will change citalopram to sertraline and monitor for benefit.  Declines counseling today, but may consider in future.  Follow-up in 4 weeks.      Relevant Medications   sertraline (ZOLOFT) 50 MG tablet   Insomnia    Chronic, stable.  We will continue Belsomra 10 mg nightly to help with sleep.  Continue good sleep hygiene and trying to wind down before bed.  Follow-up in 6 months.      Chronic low back pain    Chronic, ongoing.  Seems to do well with muscle relaxant for acute flares.  Will start on Zanaflex 4 mg, to start at nighttime and monitor for drowsiness.  Exercises given for back pain.  If this medication causes  drowsiness, do not take while driving or operating heavy machinery.  Follow-up in 4 weeks to ensure symptoms are improving.  If not improved, consider imaging with x-ray or physical therapy.      Relevant Medications   tiZANidine (ZANAFLEX) 4 MG tablet    Other Visit Diagnoses    Annual physical exam    -  Primary   Relevant Orders   VITAMIN D 25 Hydroxy (Vit-D Deficiency, Fractures) (Completed)   TSH (Completed)   Lipid Panel w/o Chol/HDL Ratio (Completed)   CBC with Differential/Platelet (Completed)   Comprehensive metabolic panel (Completed)   HgB A1c (Completed)   Screening for thyroid disorder       Relevant Orders   TSH (Completed)   Encounter for screening mammogram for malignant neoplasm of breast       Relevant Orders   MM 3D SCREEN BREAST BILATERAL       Follow up plan: Return for 2-4 weeks for mood and back pain f/u.   LABORATORY TESTING:  - Pap smear: not applicable  IMMUNIZATIONS:   - Tdap: Tetanus vaccination status reviewed: last tetanus booster within 10 years. - Influenza: Up to date - Pneumovax: Up to date - Prevnar: Up to date - HPV: Not applicable - Zostavax vaccine: Up to date  SCREENING: -Mammogram: Ordered today  - Colonoscopy: Up to date  - Bone Density: Up to date  -Hearing Test: Not applicable  -Spirometry: Not applicable   PATIENT COUNSELING:   Advised to take 1 mg of folate supplement per day if capable of pregnancy.   Sexuality: Discussed sexually transmitted diseases, partner selection, use of condoms, avoidance of unintended pregnancy  and contraceptive alternatives.   Advised to avoid cigarette smoking.  I discussed with the patient that most people either abstain from alcohol or drink within safe limits (<=14/week and <=4 drinks/occasion for males, <=7/weeks and <= 3 drinks/occasion for females) and that the risk for alcohol disorders and other health  effects rises proportionally with the number of drinks per week and how often a  drinker exceeds daily limits.  Discussed cessation/primary prevention of drug use and availability of treatment for abuse.   Diet: Encouraged to adjust caloric intake to maintain  or achieve ideal body weight, to reduce intake of dietary saturated fat and total fat, to limit sodium intake by avoiding high sodium foods and not adding table salt, and to maintain adequate dietary potassium and calcium preferably from fresh fruits, vegetables, and low-fat dairy products.    stressed the importance of regular exercise  Injury prevention: Discussed safety belts, safety helmets, smoke detector, smoking near bedding or upholstery.   Dental health: Discussed importance of regular tooth brushing, flossing, and dental visits.    NEXT PREVENTATIVE PHYSICAL DUE IN 1 YEAR. Return for 2-4 weeks for mood and back pain f/u.

## 2019-12-11 LAB — COMPREHENSIVE METABOLIC PANEL
ALT: 18 IU/L (ref 0–32)
AST: 21 IU/L (ref 0–40)
Albumin/Globulin Ratio: 2 (ref 1.2–2.2)
Albumin: 4.5 g/dL (ref 3.8–4.8)
Alkaline Phosphatase: 60 IU/L (ref 44–121)
BUN/Creatinine Ratio: 17 (ref 12–28)
BUN: 14 mg/dL (ref 8–27)
Bilirubin Total: 0.3 mg/dL (ref 0.0–1.2)
CO2: 26 mmol/L (ref 20–29)
Calcium: 9.6 mg/dL (ref 8.7–10.3)
Chloride: 100 mmol/L (ref 96–106)
Creatinine, Ser: 0.84 mg/dL (ref 0.57–1.00)
GFR calc Af Amer: 83 mL/min/{1.73_m2} (ref >59)
GFR calc non Af Amer: 72 mL/min/{1.73_m2} (ref >59)
Globulin, Total: 2.2 g/dL (ref 1.5–4.5)
Glucose: 85 mg/dL (ref 65–99)
Potassium: 4.1 mmol/L (ref 3.5–5.2)
Sodium: 140 mmol/L (ref 134–144)
Total Protein: 6.7 g/dL (ref 6.0–8.5)

## 2019-12-11 LAB — CBC WITH DIFFERENTIAL/PLATELET
Basophils Absolute: 0.1 10*3/uL (ref 0.0–0.2)
Basos: 1 %
EOS (ABSOLUTE): 0.1 10*3/uL (ref 0.0–0.4)
Eos: 1 %
Hematocrit: 41.7 % (ref 34.0–46.6)
Hemoglobin: 14.5 g/dL (ref 11.1–15.9)
Immature Grans (Abs): 0 10*3/uL (ref 0.0–0.1)
Immature Granulocytes: 0 %
Lymphocytes Absolute: 1.8 10*3/uL (ref 0.7–3.1)
Lymphs: 28 %
MCH: 30.1 pg (ref 26.6–33.0)
MCHC: 34.8 g/dL (ref 31.5–35.7)
MCV: 87 fL (ref 79–97)
Monocytes Absolute: 0.4 10*3/uL (ref 0.1–0.9)
Monocytes: 6 %
Neutrophils Absolute: 4.1 10*3/uL (ref 1.4–7.0)
Neutrophils: 64 %
Platelets: 317 10*3/uL (ref 150–450)
RBC: 4.82 x10E6/uL (ref 3.77–5.28)
RDW: 13 % (ref 11.7–15.4)
WBC: 6.5 10*3/uL (ref 3.4–10.8)

## 2019-12-11 LAB — LIPID PANEL W/O CHOL/HDL RATIO
Cholesterol, Total: 151 mg/dL (ref 100–199)
HDL: 46 mg/dL (ref >39)
LDL Chol Calc (NIH): 83 mg/dL (ref 0–99)
Triglycerides: 121 mg/dL (ref 0–149)
VLDL Cholesterol Cal: 22 mg/dL (ref 5–40)

## 2019-12-11 LAB — HEMOGLOBIN A1C
Est. average glucose Bld gHb Est-mCnc: 120 mg/dL
Hgb A1c MFr Bld: 5.8 % — ABNORMAL HIGH (ref 4.8–5.6)

## 2019-12-11 LAB — TSH: TSH: 1.77 u[IU]/mL (ref 0.450–4.500)

## 2019-12-11 LAB — VITAMIN D 25 HYDROXY (VIT D DEFICIENCY, FRACTURES): Vit D, 25-Hydroxy: 127 ng/mL — ABNORMAL HIGH (ref 30.0–100.0)

## 2019-12-12 ENCOUNTER — Encounter: Payer: Self-pay | Admitting: Nurse Practitioner

## 2019-12-12 NOTE — Assessment & Plan Note (Addendum)
Chronic, previously stable.  We will continue Metformin 500 mg twice daily with meals for now.  Also continue gabapentin 300 mg twice daily for neuropathy.  Hemoglobin A1c, foot exam done today.  Patient will reach out to ophthalmologist for retinopathy examination.  Follow-up in 6 months.

## 2019-12-12 NOTE — Assessment & Plan Note (Signed)
Chronic, stable.  We will continue Belsomra 10 mg nightly to help with sleep.  Continue good sleep hygiene and trying to wind down before bed.  Follow-up in 6 months. 

## 2019-12-12 NOTE — Assessment & Plan Note (Addendum)
History of osteopenia, will check vitamin D level today and treat as needed.  Up-to-date on DEXA scan.  Follow-up 6 months.

## 2019-12-12 NOTE — Assessment & Plan Note (Signed)
Chronic, ongoing.  We will continue pravastatin 40 mg daily for now and check lipids, CMP today.  Will adjust medication as indicated.  Follow-up in 6 months. 

## 2019-12-12 NOTE — Assessment & Plan Note (Signed)
Chronic, exacerbated currently due to stressors.  However, PHQ-9 and GAD-7 both 0 today.  No SI/HI.  Given reports of taking hydroxyzine more than 3 times daily to help with depressive symptoms, will change citalopram to sertraline and monitor for benefit.  Declines counseling today, but may consider in future.  Follow-up in 4 weeks.

## 2019-12-12 NOTE — Assessment & Plan Note (Signed)
Chronic, ongoing.  Seems to do well with muscle relaxant for acute flares.  Will start on Zanaflex 4 mg, to start at nighttime and monitor for drowsiness.  Exercises given for back pain.  If this medication causes drowsiness, do not take while driving or operating heavy machinery.  Follow-up in 4 weeks to ensure symptoms are improving.  If not improved, consider imaging with x-ray or physical therapy.

## 2019-12-12 NOTE — Assessment & Plan Note (Signed)
Chronic, stable.  BP at goal today in office.  Will continue clonidine 0.1 mg daily, hydrochlorothiazide 25 mg daily.  CBC and CMP today.  Encouraged patient to check blood pressure at home and notify clinic if greater than 140/90 consistently.  Follow-up in 6 months.

## 2019-12-13 ENCOUNTER — Telehealth: Payer: Self-pay | Admitting: Family Medicine

## 2019-12-13 ENCOUNTER — Other Ambulatory Visit: Payer: Self-pay | Admitting: Family Medicine

## 2019-12-13 NOTE — Telephone Encounter (Signed)
Patient called to ask the nurse to call her with her lab results.  CB# [phone]

## 2019-12-13 NOTE — Telephone Encounter (Signed)
Medication Refill - Medication: hydrOXYzine (VISTARIL) 25 MG capsule (Patient stated that all her prescriptions were called in but she is missing this one .)   Has the patient contacted their pharmacy? yes (Agent: If no, request that the patient contact the pharmacy for the refill.) (Agent: If yes, when and what did the pharmacy advise?)Contact PCP  Preferred Pharmacy (with phone number or street name):  [facility] - [place], [place] - [address] Phone:  [phone]  Fax:  [phone]       Agent: Please be advised that RX refills may take up to 3 business days. We ask that you follow-up with your pharmacy.

## 2019-12-13 NOTE — Telephone Encounter (Signed)
See result note. Labs were discussed with patient after this message.

## 2019-12-13 NOTE — Telephone Encounter (Signed)
Requested medication (s) are due for refill today: yes  Requested medication (s) are on the active medication list: yes  Last refill:  10/20/19 #90   Future visit scheduled: yes  Notes to clinic:  Please review if refill is appropriate. Per office notes of Jessica,NP on 12/10/19 unsure if patient should continue on this medication or if it needs to be changed.     Requested Prescriptions  Pending Prescriptions Disp Refills   hydrOXYzine (VISTARIL) 25 MG capsule 90 capsule 0    Sig: Take 1 capsule (25 mg total) by mouth 3 (three) times daily as needed.      Ear, Nose, and Throat:  Antihistamines Passed - 12/13/2019  4:53 PM      Passed - Valid encounter within last 12 months    Recent Outpatient Visits           3 days ago Annual physical exam   [facility] Valentino Nose, NP   3 months ago Upper respiratory tract infection, unspecified type   [facility] Valentino Nose, NP   6 months ago Essential hypertension   [facility] Particia Nearing, [place]   1 year ago Mixed hyperlipidemia   [facility] Particia Nearing, [place]   1 year ago Rash   [facility], Salley Hews, [place]       Future Appointments             In 3 weeks Cannady, Dorie Rank, NP [facility]   In 4 months  [facility]

## 2019-12-14 MED ORDER — HYDROXYZINE PAMOATE 25 MG PO CAPS: 25.0000 mg | ORAL_CAPSULE | Freq: Three times a day (TID) | ORAL | 1 refills | Status: DC | PRN

## 2020-01-01 ENCOUNTER — Encounter: Payer: Self-pay | Admitting: Nurse Practitioner

## 2020-01-03 ENCOUNTER — Telehealth: Payer: Self-pay

## 2020-01-03 NOTE — Telephone Encounter (Signed)
Pt's appt is 12/17. Please advise  Copied from CRM [id number]. Topic: General - Other >> Jan 03, 2020  9:08 AM Tamela Oddi wrote: Reason for CRM: Patient called to ask if her upcoming appt. Could be a phone or virtual appt.  She stated she hurt her back and did not want to come into the office.  She also stated that she can take her vitals at home.  Please call patient to confirm that this will be ok.  CB# [phone]

## 2020-01-03 NOTE — Telephone Encounter (Signed)
Can change to virtual if has video access

## 2020-01-07 ENCOUNTER — Telehealth: Payer: Medicare Other | Admitting: Nurse Practitioner

## 2020-01-10 ENCOUNTER — Telehealth: Payer: Medicare Other | Admitting: Nurse Practitioner

## 2020-01-10 ENCOUNTER — Telehealth (INDEPENDENT_AMBULATORY_CARE_PROVIDER_SITE_OTHER): Payer: Medicare Other | Admitting: Nurse Practitioner

## 2020-01-10 ENCOUNTER — Encounter: Payer: Self-pay | Admitting: Nurse Practitioner

## 2020-01-10 DIAGNOSIS — F419 Anxiety disorder, unspecified: Secondary | ICD-10-CM

## 2020-01-10 MED ORDER — SERTRALINE HCL 50 MG PO TABS
50.0000 mg | ORAL_TABLET | Freq: Every day | ORAL | 4 refills | Status: DC
Start: 2020-01-10 — End: 2020-05-19

## 2020-01-10 NOTE — Patient Instructions (Signed)

## 2020-01-10 NOTE — Progress Notes (Signed)
BP (!) 147/71   Pulse 79    Subjective:    Patient ID: Marcia [name], female    DOB: February 04, 1950, 69 y.o.   MRN: [id number]  HPI: Marcia [name] is a 69 y.o. female  Chief Complaint  Patient presents with  . Medication Refill    . This visit was completed via telephone due to the restrictions of the COVID-19 pandemic. All issues as above were discussed and addressed but no physical exam was performed. If it was felt that the patient should be evaluated in the office, they were directed there. The patient verbally consented to this visit. Patient was unable to complete an audio/visual visit due to no access and technical issues. Due to the catastrophic nature of the COVID-19 pandemic, this visit was done through audio contact only. . Location of the patient: home . Location of the provider: work . Those involved with this call:  . Provider: Aura Dials, DNP . CMA: Wilhemena Durie, CMA . Front Desk/Registration: Harriet Pho  . Time spent on call: 20 minutes on the phone discussing health concerns. 15 minutes total spent in review of patient's record and preparation of their chart.  . I verified patient identity using two factors (patient name and date of birth). Patient consents verbally to being seen via telemedicine visit today.    DEPRESSION Had Sertraline started at last visit, reports this has helped a lot.  Had been on Celexa in past.   Mood status: stable Satisfied with current treatment?: yes Symptom severity: moderate  Duration of current treatment : chronic Side effects: no Medication compliance: good compliance Psychotherapy/counseling: none Depressed mood: no Anxious mood: no Anhedonia: no Significant weight loss or gain: no Insomnia: none Fatigue: no Feelings of worthlessness or guilt: no Impaired concentration/indecisiveness: no Suicidal ideations: no Hopelessness: no Crying spells: no Depression screen [facility] 2/9 01/10/2020 12/10/2019 05/21/2019  05/04/2019 04/28/2019  Decreased Interest 0 0 0 0 0  Down, Depressed, Hopeless 0 0 0 0 0  PHQ - 2 Score 0 0 0 0 0  Altered sleeping 0 0 0 - -  Tired, decreased energy 0 0 0 - -  Change in appetite 0 0 0 - -  Feeling bad or failure about yourself  0 0 0 - -  Trouble concentrating 0 0 0 - -  Moving slowly or fidgety/restless 0 0 0 - -  Suicidal thoughts 0 0 0 - -  PHQ-9 Score 0 0 0 - -  Difficult doing work/chores Not difficult at all Not difficult at all - - -    Relevant past medical, surgical, family and social history reviewed and updated as indicated. Interim medical history since our last visit reviewed. Allergies and medications reviewed and updated.  Review of Systems  Constitutional: Negative for activity change, appetite change, diaphoresis, fatigue and fever.  Respiratory: Negative for cough, chest tightness and shortness of breath.   Cardiovascular: Negative for chest pain, palpitations and leg swelling.  Gastrointestinal: Negative.   Neurological: Negative.   Psychiatric/Behavioral: Negative.     Per HPI unless specifically indicated above     Objective:    BP (!) 147/71   Pulse 79   Wt Readings from Last 3 Encounters:  12/10/19 157 lb (71.2 kg)  09/03/19 160 lb (72.6 kg)  05/21/19 162 lb (73.5 kg)    Physical Exam   Unable to assess due to telephone visit only.  Results for orders placed or performed in visit on 12/10/19  VITAMIN D 25  Hydroxy (Vit-D Deficiency, Fractures)  Result Value Ref Range   Vit D, 25-Hydroxy 127.0 (H) 30.0 - 100.0 ng/mL  TSH  Result Value Ref Range   TSH 1.770 0.450 - 4.500 uIU/mL  Lipid Panel w/o Chol/HDL Ratio  Result Value Ref Range   Cholesterol, Total 151 100 - 199 mg/dL   Triglycerides 811 0 - 149 mg/dL   HDL 46 >57 mg/dL   VLDL Cholesterol Cal 22 5 - 40 mg/dL   LDL Chol Calc (NIH) 83 0 - 99 mg/dL  CBC with Differential/Platelet  Result Value Ref Range   WBC 6.5 3.4 - 10.8 x10E3/uL   RBC 4.82 3.77 - 5.28 x10E6/uL    Hemoglobin 14.5 11.1 - 15.9 g/dL   Hematocrit 26.2 03.5 - 46.6 %   MCV 87 79 - 97 fL   MCH 30.1 26.6 - 33.0 pg   MCHC 34.8 31.5 - 35.7 g/dL   RDW 59.7 41.6 - 38.4 %   Platelets 317 150 - 450 x10E3/uL   Neutrophils 64 Not Estab. %   Lymphs 28 Not Estab. %   Monocytes 6 Not Estab. %   Eos 1 Not Estab. %   Basos 1 Not Estab. %   Neutrophils Absolute 4.1 1.4 - 7.0 x10E3/uL   Lymphocytes Absolute 1.8 0.7 - 3.1 x10E3/uL   Monocytes Absolute 0.4 0.1 - 0.9 x10E3/uL   EOS (ABSOLUTE) 0.1 0.0 - 0.4 x10E3/uL   Basophils Absolute 0.1 0.0 - 0.2 x10E3/uL   Immature Granulocytes 0 Not Estab. %   Immature Grans (Abs) 0.0 0.0 - 0.1 x10E3/uL  Comprehensive metabolic panel  Result Value Ref Range   Glucose 85 65 - 99 mg/dL   BUN 14 8 - 27 mg/dL   Creatinine, Ser 5.36 0.57 - 1.00 mg/dL   GFR calc non Af Amer 72 >59 mL/min/1.73   GFR calc Af Amer 83 >59 mL/min/1.73   BUN/Creatinine Ratio 17 12 - 28   Sodium 140 134 - 144 mmol/L   Potassium 4.1 3.5 - 5.2 mmol/L   Chloride 100 96 - 106 mmol/L   CO2 26 20 - 29 mmol/L   Calcium 9.6 8.7 - 10.3 mg/dL   Total Protein 6.7 6.0 - 8.5 g/dL   Albumin 4.5 3.8 - 4.8 g/dL   Globulin, Total 2.2 1.5 - 4.5 g/dL   Albumin/Globulin Ratio 2.0 1.2 - 2.2   Bilirubin Total 0.3 0.0 - 1.2 mg/dL   Alkaline Phosphatase 60 44 - 121 IU/L   AST 21 0 - 40 IU/L   ALT 18 0 - 32 IU/L  HgB A1c  Result Value Ref Range   Hgb A1c MFr Bld 5.8 (H) 4.8 - 5.6 %   Est. average glucose Bld gHb Est-mCnc 120 mg/dL      Assessment & Plan:   Problem List Items Addressed This Visit      Other   Anxiety    Chronic, stable with overall improvement with Sertraline.  Continue current medication regimen and adjust as needed.  Denies SI/HI.  Refills sent for 90 day supply + refills.  Recommend she reach out to office if any worsening mood or changes.  At this time follow-up in 3 months for mood and diabetes.      Relevant Medications   sertraline (ZOLOFT) 50 MG tablet      I discussed  the assessment and treatment plan with the patient. The patient was provided an opportunity to ask questions and all were answered. The patient agreed with the plan and  demonstrated an understanding of the instructions.   The patient was advised to call back or seek an in-person evaluation if the symptoms worsen or if the condition fails to improve as anticipated.   I provided 21+ minutes of time during this encounter.  Follow up plan: Return in about 3 months (around 04/09/2020) for T2DM, HTN/HLD, MOOD.

## 2020-01-10 NOTE — Assessment & Plan Note (Addendum)
Chronic, stable with overall improvement with Sertraline.  Continue current medication regimen and adjust as needed.  Denies SI/HI.  Refills sent for 90 day supply + refills.  Recommend she reach out to office if any worsening mood or changes.  At this time follow-up in 3 months for mood and diabetes.

## 2020-01-18 ENCOUNTER — Telehealth: Payer: Self-pay

## 2020-01-18 ENCOUNTER — Telehealth: Payer: Self-pay | Admitting: General Practice

## 2020-01-18 NOTE — Telephone Encounter (Signed)
  Chronic Care Management   Outreach Note  01/18/2020 Name: Marcia [name] MRN: [id number] DOB: April 09, 1950  Referred by: Particia Nearing, PA-C Reason for referral : Appointment (RNCM Follow up call for chronic disease management and Care coordination needs)   NARCISSA MELDER is enrolled in a Managed Medicaid Health Plan: No  An unsuccessful telephone outreach was attempted today. The patient was referred to the case management team for assistance with care management and care coordination.   Follow Up Plan: A HIPAA compliant phone message was left for the patient providing contact information and requesting a return call.   Alto Denver RN, MSN, CCM Community Care Coordinator [place]  [facility] Mobile: [phone]

## 2020-02-04 ENCOUNTER — Other Ambulatory Visit: Payer: Self-pay

## 2020-02-04 ENCOUNTER — Ambulatory Visit
Admission: RE | Admit: 2020-02-04 | Discharge: 2020-02-04 | Disposition: A | Discharge status: Home / Self Care | Payer: Medicare Other | Source: Ambulatory Visit | Attending: Nurse Practitioner | Admitting: Nurse Practitioner

## 2020-02-04 DIAGNOSIS — Z1231 Encounter for screening mammogram for malignant neoplasm of breast: Secondary | ICD-10-CM | POA: Insufficient documentation

## 2020-02-11 ENCOUNTER — Telehealth: Payer: Self-pay

## 2020-02-11 NOTE — Telephone Encounter (Signed)
Please let her know I reviewed mammogram results and these are normal with recommendation to repeat in one year.

## 2020-02-11 NOTE — Telephone Encounter (Signed)
Patient notified of results.

## 2020-02-11 NOTE — Telephone Encounter (Signed)
Routing to provider to advise. Letter in chart states normal mammogram from radiologist. Can I tell the patient that? Is there anything else to advise her of?

## 2020-02-11 NOTE — Telephone Encounter (Signed)
Copied from CRM [id number]. Topic: General - Other >> Feb 11, 2020  9:31 AM Marcia [name] A wrote: [phone] for CRM: Patient called in to say she have not received a letter about her Mammogram and would like a call to discuss the results. Please call her at Ph# [phone]

## 2020-02-16 ENCOUNTER — Telehealth: Payer: Self-pay

## 2020-02-16 NOTE — Chronic Care Management (AMB) (Signed)
  Care Management   Note  02/16/2020 Name: Marcia [name] MRN: [id number] DOB: 09-21-1950  Marcia [name] is a 70 y.o. year old female who is a primary care patient of Marcia Grooms, NP and is actively engaged with the care management team. I reached out to Marcia [name] by phone today to assist with re-scheduling a follow up visit with the RN Case Manager  Follow up plan: Unsuccessful telephone outreach attempt made. A HIPAA compliant phone message was left for the patient providing contact information and requesting a return call.  The care management team will reach out to the patient again over the next 7 days.  If patient returns call to provider office, please advise to call Embedded Care Management Care Guide Marcia [name]  at [phone]  Marcia [name], RMA Care Guide, Embedded Care Coordination [facility]  [place], [place] [address] Direct Dial: [phone] [name]

## 2020-02-21 NOTE — Telephone Encounter (Signed)
Pt has been rescheduled. 

## 2020-02-21 NOTE — Chronic Care Management (AMB) (Signed)
  Care Management   Note  02/21/2020 Name: [name] MRN: [id number] DOB: [birth year]  Marcia [name] is a 70 y.o. year old female who is a primary care patient of Marcia Grooms, NP and is actively engaged with the care management team. I reached out to [name] by phone today to assist with re-scheduling a follow up visit with the RN Case Manager  Follow up plan: Telephone appointment with care management team member scheduled for:03/31/2020  Penne Lash, RMA Care Guide, Embedded Care Coordination [facility]  [place], [place] [address] Direct Dial: [phone] [email or web address]

## 2020-03-21 ENCOUNTER — Other Ambulatory Visit: Payer: Self-pay

## 2020-03-21 NOTE — Telephone Encounter (Signed)
Last seen 11/21 and 12/22, has follow up this month.

## 2020-03-22 MED ORDER — HYDROXYZINE PAMOATE 25 MG PO CAPS: 25.0000 mg | ORAL_CAPSULE | Freq: Three times a day (TID) | ORAL | 1 refills | Status: DC | PRN

## 2020-03-24 ENCOUNTER — Encounter: Payer: Self-pay | Admitting: Nurse Practitioner

## 2020-03-24 ENCOUNTER — Ambulatory Visit (INDEPENDENT_AMBULATORY_CARE_PROVIDER_SITE_OTHER): Payer: Medicare Other | Admitting: Nurse Practitioner

## 2020-03-24 ENCOUNTER — Other Ambulatory Visit: Payer: Self-pay

## 2020-03-24 VITALS — BP 106/67 | HR 87 | Temp 98.6°F | Wt 169.2 lb

## 2020-03-24 DIAGNOSIS — R21 Rash and other nonspecific skin eruption: Secondary | ICD-10-CM

## 2020-03-24 MED ORDER — METHYLPREDNISOLONE 4 MG PO TBPK: ORAL_TABLET | ORAL | 0 refills | Status: DC

## 2020-03-24 NOTE — Progress Notes (Signed)
BP 106/67   Pulse 87   Temp 98.6 F (37 C)   Wt 169 lb 4 oz (76.8 kg)   SpO2 95%   BMI 26.43 kg/m    Subjective:    Patient ID: Marcia [name], female    DOB: Dec 15, 1950, 70 y.o.   MRN: [id number]  HPI: Marcia [name] is a 70 y.o. female  Chief Complaint  Patient presents with  . Rash    On shoulders, back and legs Started using tide about 1.5 weeks ago.  Also has a place inside of her ear. Wound on shoulder will ooze at times   RASH Duration:  1.5 weeks ago  Location: generalized  Itching: yes Burning: yes Redness: yes Oozing: yes Scaling: no Blisters: no Painful: yes on shoulders Fevers: no Change in detergents/soaps/personal care products: yes Recent illness: no Recent travel:no History of same: no Context: worse Alleviating factors: nothing Treatments attempted:peroxide Shortness of breath: no  Throat/tongue swelling: no Myalgias/arthralgias: no  Relevant past medical, surgical, family and social history reviewed and updated as indicated. Interim medical history since our last visit reviewed. Allergies and medications reviewed and updated.  Review of Systems  Constitutional: Negative for fatigue.  HENT: Negative for sore throat and trouble swallowing.   Respiratory: Negative for shortness of breath.   Skin: Positive for rash.    Per HPI unless specifically indicated above     Objective:    BP 106/67   Pulse 87   Temp 98.6 F (37 C)   Wt 169 lb 4 oz (76.8 kg)   SpO2 95%   BMI 26.43 kg/m   Wt Readings from Last 3 Encounters:  03/24/20 169 lb 4 oz (76.8 kg)  12/10/19 157 lb (71.2 kg)  09/03/19 160 lb (72.6 kg)    Physical Exam Vitals and nursing note reviewed.  Constitutional:      General: She is not in acute distress.    Appearance: Normal appearance. She is normal weight. She is not ill-appearing, toxic-appearing or diaphoretic.  HENT:     Head: Normocephalic.     Right Ear: External ear normal.     Left Ear: External ear  normal.     Nose: Nose normal.     Mouth/Throat:     Mouth: Mucous membranes are moist.     Pharynx: Oropharynx is clear.  Eyes:     General:        Right eye: No discharge.        Left eye: No discharge.     Extraocular Movements: Extraocular movements intact.     Conjunctiva/sclera: Conjunctivae normal.     Pupils: Pupils are equal, round, and reactive to light.  Cardiovascular:     Rate and Rhythm: Normal rate and regular rhythm.     Heart sounds: No murmur heard.   Pulmonary:     Effort: Pulmonary effort is normal. No respiratory distress.     Breath sounds: Normal breath sounds. No wheezing or rales.  Musculoskeletal:     Cervical back: Normal range of motion and neck supple.  Skin:    General: Skin is warm and dry.     Capillary Refill: Capillary refill takes less than 2 seconds.     Comments: Diffuse blister on body- including back, shoulders, legs and arms.  Neurological:     General: No focal deficit present.     Mental Status: She is alert and oriented to person, place, and time. Mental status is at baseline.  Psychiatric:  Mood and Affect: Mood normal.        Behavior: Behavior normal.        Thought Content: Thought content normal.        Judgment: Judgment normal.     Results for orders placed or performed in visit on 12/10/19  VITAMIN D 25 Hydroxy (Vit-D Deficiency, Fractures)  Result Value Ref Range   Vit D, 25-Hydroxy 127.0 (H) 30.0 - 100.0 ng/mL  TSH  Result Value Ref Range   TSH 1.770 0.450 - 4.500 uIU/mL  Lipid Panel w/o Chol/HDL Ratio  Result Value Ref Range   Cholesterol, Total 151 100 - 199 mg/dL   Triglycerides 166 0 - 149 mg/dL   HDL 46 >06 mg/dL   VLDL Cholesterol Cal 22 5 - 40 mg/dL   LDL Chol Calc (NIH) 83 0 - 99 mg/dL  CBC with Differential/Platelet  Result Value Ref Range   WBC 6.5 3.4 - 10.8 x10E3/uL   RBC 4.82 3.77 - 5.28 x10E6/uL   Hemoglobin 14.5 11.1 - 15.9 g/dL   Hematocrit 30.1 60.1 - 46.6 %   MCV 87 79 - 97 fL   MCH  30.1 26.6 - 33.0 pg   MCHC 34.8 31.5 - 35.7 g/dL   RDW 09.3 23.5 - 57.3 %   Platelets 317 150 - 450 x10E3/uL   Neutrophils 64 Not Estab. %   Lymphs 28 Not Estab. %   Monocytes 6 Not Estab. %   Eos 1 Not Estab. %   Basos 1 Not Estab. %   Neutrophils Absolute 4.1 1.4 - 7.0 x10E3/uL   Lymphocytes Absolute 1.8 0.7 - 3.1 x10E3/uL   Monocytes Absolute 0.4 0.1 - 0.9 x10E3/uL   EOS (ABSOLUTE) 0.1 0.0 - 0.4 x10E3/uL   Basophils Absolute 0.1 0.0 - 0.2 x10E3/uL   Immature Granulocytes 0 Not Estab. %   Immature Grans (Abs) 0.0 0.0 - 0.1 x10E3/uL  Comprehensive metabolic panel  Result Value Ref Range   Glucose 85 65 - 99 mg/dL   BUN 14 8 - 27 mg/dL   Creatinine, Ser 2.20 0.57 - 1.00 mg/dL   GFR calc non Af Amer 72 >59 mL/min/1.73   GFR calc Af Amer 83 >59 mL/min/1.73   BUN/Creatinine Ratio 17 12 - 28   Sodium 140 134 - 144 mmol/L   Potassium 4.1 3.5 - 5.2 mmol/L   Chloride 100 96 - 106 mmol/L   CO2 26 20 - 29 mmol/L   Calcium 9.6 8.7 - 10.3 mg/dL   Total Protein 6.7 6.0 - 8.5 g/dL   Albumin 4.5 3.8 - 4.8 g/dL   Globulin, Total 2.2 1.5 - 4.5 g/dL   Albumin/Globulin Ratio 2.0 1.2 - 2.2   Bilirubin Total 0.3 0.0 - 1.2 mg/dL   Alkaline Phosphatase 60 44 - 121 IU/L   AST 21 0 - 40 IU/L   ALT 18 0 - 32 IU/L  HgB A1c  Result Value Ref Range   Hgb A1c MFr Bld 5.8 (H) 4.8 - 5.6 %   Est. average glucose Bld gHb Est-mCnc 120 mg/dL      Assessment & Plan:   Problem List Items Addressed This Visit   None   Visit Diagnoses    Rash    -  Primary   Complete course of steroids. Use Zyrtec for itching. Do not use creams for risk of infection. Reviewed signs of infection.  RTC if symptoms worsen.   Relevant Medications   methylPREDNISolone (MEDROL DOSEPAK) 4 MG TBPK tablet  Follow up plan: Return if symptoms worsen or fail to improve.  A total of 20 minutes were spent on this encounter today.  When total time is documented, this includes both the face-to-face and non-face-to-face time  personally spent before, during and after the visit on the date of the encounter.

## 2020-03-31 ENCOUNTER — Telehealth: Payer: Self-pay | Admitting: General Practice

## 2020-03-31 ENCOUNTER — Telehealth: Payer: Medicare Other

## 2020-03-31 NOTE — Telephone Encounter (Signed)
  Chronic Care Management   Outreach Note  03/31/2020 Name: Marcia [name] MRN: [id number] DOB: 1950-06-15  Referred by: Larae Grooms, NP Reason for referral : Appointment (RNCM: Follow up: 2nd attempt for Chronic Disease Management and Care Coordination Needs )   An unsuccessful telephone outreach was attempted today. The patient was referred to the case management team for assistance with care management and care coordination.   Follow Up Plan: A HIPAA compliant phone message was left for the patient providing contact information and requesting a return call.   Alto Denver RN, MSN, CCM Community Care Coordinator [place]  [facility] Mobile: [phone]

## 2020-04-18 DIAGNOSIS — I700 Atherosclerosis of aorta: Secondary | ICD-10-CM | POA: Insufficient documentation

## 2020-04-26 ENCOUNTER — Telehealth: Payer: Self-pay | Admitting: Nurse Practitioner

## 2020-04-26 NOTE — Telephone Encounter (Signed)
Called and left a message for patient to call and let me know what meter she would like.

## 2020-04-26 NOTE — Telephone Encounter (Signed)
Please advise 

## 2020-04-26 NOTE — Telephone Encounter (Signed)
Explained to patient that these are expensive and that insurance does not usually cover them.  She would like a regular meter sent. Ready for signature

## 2020-04-26 NOTE — Telephone Encounter (Signed)
Pt called reporting that she would like for her PCP to prescribe her what has been advertised to her at [employer]. She does not know the name of the blood sugar testing device, but she says its the kind that you place on the back of your arm instead of pricking your finger.   Best contact: [phone]

## 2020-04-27 NOTE — Telephone Encounter (Signed)
Patient has been rescheduled on 06/02/2020  [facility]

## 2020-04-27 NOTE — Telephone Encounter (Signed)
Patient has made an additional call regarding the prescription for testing supplies Please contact to advise further when possible

## 2020-04-27 NOTE — Telephone Encounter (Signed)
Informed patient that I will fax prescription over today.

## 2020-04-27 NOTE — Telephone Encounter (Signed)
Please reschedule f/u with RN CM at [facility]  

## 2020-04-28 NOTE — Telephone Encounter (Signed)
Prescription faxed again.

## 2020-04-28 NOTE — Telephone Encounter (Signed)
Pt following up on request for a new glucometer , strips and lancets.  Whichever meter her insurance will pay for.

## 2020-05-03 ENCOUNTER — Ambulatory Visit: Payer: Medicare Other

## 2020-05-05 ENCOUNTER — Ambulatory Visit: Payer: Medicare Other

## 2020-05-12 ENCOUNTER — Ambulatory Visit (INDEPENDENT_AMBULATORY_CARE_PROVIDER_SITE_OTHER): Payer: Medicare Other

## 2020-05-12 ENCOUNTER — Encounter: Payer: Self-pay | Admitting: Nurse Practitioner

## 2020-05-12 VITALS — Temp 98.1°F | Ht 68.0 in | Wt 159.0 lb

## 2020-05-12 DIAGNOSIS — E1159 Type 2 diabetes mellitus with other circulatory complications: Secondary | ICD-10-CM | POA: Insufficient documentation

## 2020-05-12 DIAGNOSIS — Z0000 Encounter for general adult medical examination without abnormal findings: Secondary | ICD-10-CM | POA: Diagnosis not present

## 2020-05-12 NOTE — Progress Notes (Signed)
I connected with Marcia [name] today by telephone and verified that I am speaking with the correct person using two identifiers. Location patient: home Location provider: work Persons participating in the virtual visit: Marcia [name], Elisha PonderNickeah Less Woolsey LPN   I discussed the limitations, risks, security and privacy concerns of performing an evaluation and management service by telephone and the availability of in person appointments. I also discussed with the patient that there may be a patient responsible charge related to this service. The patient expressed understanding and verbally consented to this telephonic visit.    Interactive audio and video telecommunications were attempted between this provider and patient, however failed, due to patient having technical difficulties OR patient did not have access to video capability.  We continued and completed visit with audio only.     Vital signs may be patient reported or missing.  Subjective:   Marcia [name] is a 70 y.o. female who presents for Medicare Annual (Subsequent) preventive examination.  Review of Systems     Cardiac Risk Factors include: advanced age (>[id number], 52>65 women);diabetes mellitus;dyslipidemia;hypertension     Objective:    Today's Vitals   05/12/20 1426  Weight: 159 lb (72.1 kg)  Height: 5\' 8"  (1.727 m)   Body mass index is 24.18 kg/m.  Advanced Directives 05/12/2020 04/28/2019 11/19/2018 08/25/2018 04/01/2018 03/25/2018 12/06/2017  Does Patient Have a Medical Advance Directive? No No No No No No No  Does patient want to make changes to medical advance directive? - Yes (MAU/Ambulatory/Procedural Areas - Information given) - - - - -  Would patient like information on creating a medical advance directive? - - - No - Patient declined No - Patient declined - No - Patient declined    Current Medications (verified) Outpatient Encounter Medications as of 05/12/2020  Medication Sig  . cloNIDine (CATAPRES) 0.1 MG tablet Take  1 tablet (0.1 mg total) by mouth daily.  . fluticasone (FLONASE) 50 MCG/ACT nasal spray Place 2 sprays into both nostrils daily.  [name]. gabapentin (NEURONTIN) 300 MG capsule Take 1 capsule (300 mg total) by mouth 2 (two) times daily.  . hydrochlorothiazide (HYDRODIURIL) 25 MG tablet Take 1 tablet (25 mg total) by mouth daily.  . hydrOXYzine (VISTARIL) 25 MG capsule Take 1 capsule (25 mg total) by mouth 3 (three) times daily as needed for anxiety.  . metFORMIN (GLUCOPHAGE) 500 MG tablet Take 1 tablet (500 mg total) by mouth 2 (two) times daily with a meal.  . pravastatin (PRAVACHOL) 40 MG tablet Take 1 tablet (40 mg total) by mouth daily.  . sertraline (ZOLOFT) 50 MG tablet Take 1 tablet (50 mg total) by mouth daily.  . Suvorexant (BELSOMRA) 10 MG TABS Take 10 mg by mouth at bedtime.  . methylPREDNISolone (MEDROL DOSEPAK) 4 MG TBPK tablet Take as directed. (Patient not taking: Reported on 05/12/2020)  . tiZANidine (ZANAFLEX) 4 MG tablet Take 1 tablet (4 mg total) by mouth at bedtime as needed for muscle spasms. (Patient not taking: Reported on 05/12/2020)   No facility-administered encounter medications on file as of 05/12/2020.    Allergies (verified) Patient has no known allergies.   History: Past Medical History:  Diagnosis Date  . Anxiety   . Colon cancer screening   . Depression   . Hyperlipidemia   . Hypertension   . Perforated bowel (HCC) 12/06/2017   Past Surgical History:  Procedure Laterality Date  . BREAST BIOPSY Left 2015   CORE W/CLIP - NEG  . BREAST BIOPSY Left 07/2007  neg bx   . CESAREAN SECTION    . COLON SURGERY    . COLONOSCOPY WITH PROPOFOL N/A 02/18/2018   Procedure: COLONOSCOPY WITH PROPOFOL;  Surgeon: Wyline Mood, MD;  Location: [facility] ENDOSCOPY;  Service: Gastroenterology;  Laterality: N/A;  . COLONOSCOPY WITH PROPOFOL N/A 03/25/2018   Procedure: COLONOSCOPY WITH PROPOFOL;  Surgeon: Wyline Mood, MD;  Location: [facility] ENDOSCOPY;  Service: Gastroenterology;  Laterality:  N/A;  . COLONOSCOPY WITH PROPOFOL N/A 08/25/2018   Procedure: COLONOSCOPY WITH PROPOFOL;  Surgeon: Toney Reil, MD;  Location: [facility] ENDOSCOPY;  Service: Gastroenterology;  Laterality: N/A;  . LAPAROTOMY N/A 12/06/2017   Procedure: EXPLORATORY LAPAROTOMY, sigmoid colectomy, anastomosis;  Surgeon: Ancil Linsey, MD;  Location: [facility] ORS;  Service: General;  Laterality: N/A;  . TUBAL LIGATION    . XI ROBOTIC ASSISTED VENTRAL HERNIA N/A 11/26/2018   Procedure: XI ROBOTIC ASSISTED VENTRAL HERNIA;  Surgeon: Leafy Ro, MD;  Location: [facility] ORS;  Service: General;  Laterality: N/A;   Family History  Problem Relation Age of Onset  . Breast cancer Mother   . Liver cancer Sister   . Pancreatic cancer Sister   . Stomach cancer Brother   . Lung cancer Brother   . Brain cancer Sister   . Diabetes Father   . Heart disease Father   . Breast cancer Sister   . Heart attack Brother   . COPD Neg Hx   . Stroke Neg Hx    Social History   Socioeconomic History  . Marital status: Divorced    Spouse name: Not on file  . Number of children: Not on file  . Years of education: Not on file  . Highest education level: High school graduate  Occupational History  . Not on file  Tobacco Use  . Smoking status: Former Smoker    Types: Cigarettes    Quit date: 11/18/2012    Years since quitting: 7.4  . Smokeless tobacco: Never Used  . Tobacco comment: quit over 15 years ago   Vaping Use  . Vaping Use: Every day  Substance and Sexual Activity  . Alcohol use: No  . Drug use: No  . Sexual activity: Not Currently  Other Topics Concern  . 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 Difficulty of Paying Living Expenses: Not hard at all  Food Insecurity: No Food Insecurity  . Worried About Community education officer in the Last Year: Never true  . Ran Out of Food in the Last Year: Never true  Transportation Needs: No Transportation Needs  . Lack of Transportation (Medical): No  . Lack of Transportation (Non-Medical): No  Physical Activity: Insufficiently Active  . Days of Exercise per Week: 7 days  . Minutes of Exercise per Session: 10 min  Stress: No Stress Concern Present  . Feeling of Stress : Not at all  Social Connections: Not on file    Tobacco Counseling Counseling given: Not Answered Comment: quit over 15 years ago    Clinical Intake:  Pre-visit preparation completed: Yes  Pain : No/denies pain     Nutritional Status: BMI of 19-24  Normal Nutritional Risks: None Diabetes: Yes  How often do you need to have someone help you  when you read instructions, pamphlets, or other written materials from your doctor or pharmacy?: 1 - Never What is the last grade level you completed in school?: 9th grade  Diabetic? Yes Nutrition Risk Assessment:  Has the patient had any N/V/D within the last 2 months?  No  Does the patient have any non-healing wounds?  Yes  Has the patient had any unintentional weight loss or weight gain?  No   Diabetes:  Is the patient diabetic?  Yes  If diabetic, was a CBG obtained today?  No  Did the patient bring in their glucometer from home?  No  How often do you monitor your CBG's? daily.   Financial Strains and Diabetes Management:  Are you having any financial strains with the device, your supplies or your medication? No .  Does the patient want to be seen by Chronic Care Management for management of their diabetes?  No  Would the patient like to be referred to a Nutritionist or for Diabetic Management?  No   Diabetic Exams:  Diabetic Eye Exam: Overdue for diabetic eye exam. Pt has been advised about the importance in completing this exam. Patient advised to call and schedule an eye exam. Diabetic Foot Exam: Completed  12/10/2019   Interpreter Needed?: No  Information entered by :: NAllen LPN   Activities of Daily Living In your present state of health, do you have any difficulty performing the following activities: 05/12/2020 12/10/2019  Hearing? N N  Vision? N N  Difficulty concentrating or making decisions? N N  Walking or climbing stairs? N N  Dressing or bathing? N N  Doing errands, shopping? N N  Preparing Food and eating ? N -  Using the Toilet? N -  In the past six months, have you accidently leaked urine? N -  Do you have problems with loss of bowel control? N -  Managing your Medications? N -  Managing your Finances? N -  Housekeeping or managing your Housekeeping? N -  Some recent data might be hidden    Patient Care Team: Larae Grooms, NP as PCP - General Pasty Spillers, MD as Consulting Physician (Gastroenterology) Ancil Linsey, MD (Inactive) as Consulting Physician (General Surgery) Marlowe Sax, RN as Case Manager (General Practice)  Indicate any recent Medical Services you may have received from other than [facility] providers in the past year (date may be approximate).     Assessment:   This is a routine wellness examination for Madysin.  Hearing/Vision screen No exam data present  Dietary issues and exercise activities discussed: Current Exercise Habits: Home exercise routine, Type of exercise: stretching, Time (Minutes): 10, Frequency (Times/Week): 7, Weekly Exercise (Minutes/Week): 70  Goals    . Increase water intake     Recommend drinking at least 4-5 glasses of water a day     . Patient Stated     05/12/2020, wants to lose stomach    . RNCM: Pt-"I have been checking my blood sugars too because she said I  had diabetes number 2"     Current Barriers:  . Chronic Disease Management support, education, and care coordination needs related to HTN, HLD, DMII, Anxiety, and Depression . Currently does not have a blood pressure cuff  Clinical Goal(s)  related to HTN, HLD, DMII, Anxiety, and Depression:  Over the next 120 days, patient will:  . Work with the care management team to address educational, disease management, and care coordination needs  . Begin or continue self health  monitoring activities as directed today Measure and record cbg (blood glucose) BID times daily and Measure and record blood pressure 2 times per week . Call provider office for new or worsened signs and symptoms Blood glucose findings outside established parameters, Blood pressure findings outside established parameters, and New or worsened symptom related to anxiety  . Call care management team with questions or concerns . Verbalize basic understanding of patient centered plan of care established today  Interventions related to HTN, HLD, DMII, Anxiety, and Depression:  . Evaluation of current treatment plans and patient's adherence to plan as established by provider.  The patient has a good understanding of her chronic conditions. The patient is happy to report that her blood sugars are staying around 108. 11-10-2019: The patient states that her blood sugars are staying down and are in the 100's around 107 and 108. The patient states her blood sugars are consistently staying at around 108.  . Assessed patient understanding of disease states.  The Patient is doing well and denies any new concerns at this time. 11-10-2019: The patient states that she is doing well and has no new concerns with her health at this time. Is thankful for the follow up by the [facility]   [name] Assessed patient's education and care coordination needs.  Review of heart healthy/ADA diet. The patient is mindful of her food intake. She has switched over to sugar free products including ice cream, Reece cups, puddings and other sugar free products. Praised for positive lifestyle changes.  The patient verbalized her church was bringing her supper tonight. 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 The patient was helping with the wheelchair and it came down on her big toe. She has been soaking it in epsom salt and using peroxide on it.  09-15-2019: The patient states her toe is doing well. She lost the nail but a new one has started growing. Denies any further issues. 11-10-2019: Her toe nail has grown back now and the patient states she has had no other issues with her toe.  . Provided disease specific education to patient. Evaluation of blood pressure checks.  The patient is not taking blood pressures at home but feels her blood pressures are good. She is compliant with medications regimen. Denies any changes with her blood pressures. . Evaluation of exercise regimen. 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 She does ride the exercise bike at least 2 days a week for 20 minutes.  Encouraged activity.   . Evaluation of questions/concerns related to the COVID vaccines. The patient has had both vaccines and denies any concerns related to the vaccinations. 11-10-2019:The patient states she had a booster shot and her flu shot at [place] in [place].  She could not remember the date of the booster or the flu vaccine. Attempts to get the information from [facility] unsuccessful due to extended wait time.  Will attempt to get at another time.  Steele Sizer with appropriate clinical care team members regarding patient needs.  The patient denies any needs at this time. Knows the LCSW and Pharmacist are available as needed.  . Evaluation of upcoming appointments. The patient sees the pcp again 12-03-2019. She has an eye appointment coming up to have her eyes checked. She feels she is doing very well. Will continue to monitor.   Patient Self Care Activities  related to HTN, HLD, DMII, Anxiety, and Depression:  . Patient is unable to independently self-manage chronic health conditions  Please see past updates related  to this goal by clicking on the "Past Updates" button in the selected goal        Depression Screen PHQ 2/9 Scores 05/12/2020 01/10/2020 12/10/2019 05/21/2019 05/04/2019 04/28/2019 12/11/2018  PHQ - 2 Score 0 0 0 0 0 0 0  PHQ- 9 Score - 0 0 0 - - 1    Fall Risk Fall Risk  05/12/2020 12/10/2019 05/21/2019 04/28/2019 12/11/2018  Falls in the past year? 0 0 0 0 0  Number falls in past yr: - 0 0 0 0  Injury with Fall? - 0 0 0 0  Risk for fall due to : Medication side effect No Fall Risks - - -  Follow up Falls evaluation completed;Education provided;Falls prevention discussed Falls evaluation completed - - -    FALL RISK PREVENTION PERTAINING TO THE HOME:  Any stairs in or around the home? Yes  If so, are there any without handrails? Yes  Home free of loose throw rugs in walkways, pet beds, electrical cords, etc? Yes  Adequate lighting in your home to reduce risk of falls? Yes   ASSISTIVE DEVICES UTILIZED TO PREVENT FALLS:  Life alert? No  Use of a cane, walker or w/c? No  Grab bars in the bathroom? No  Shower chair or bench in shower? No  Elevated toilet seat or a handicapped toilet? Yes   TIMED UP AND GO:  Was the test performed? No   Cognitive Function:     6CIT Screen 05/12/2020 04/01/2018 11/13/2016  What Year? 0 points 0 points 0 points  What month? 0 points 0 points 0 points  What time? 0 points 0 points 0 points  Count back from 20 0 points 0 points 0 points  Months in reverse 0 points 0 points 0 points  Repeat phrase 4 points 2 points 0 points  Total Score 4 2 0    Immunizations Immunization History  Administered Date(s) Administered  . Influenza, High Dose Seasonal PF 11/13/2016, 10/15/2017, 10/21/2018, 10/08/2019  . Influenza-Unspecified 11/25/2019  . PFIZER(Purple Top)SARS-COV-2 Vaccination 04/02/2019, 04/27/2019, 11/05/2019  . Pneumococcal Conjugate-13 11/13/2016  . Pneumococcal Polysaccharide-23 12/03/2017  . Tdap 11/17/2013  . Zoster Recombinat (Shingrix)  02/21/2018, 06/22/2018    TDAP status: Up to date  Flu Vaccine status: Up to date  Pneumococcal vaccine status: Up to date  Covid-19 vaccine status: Completed vaccines  Qualifies for Shingles Vaccine? Yes   Zostavax completed No   Shingrix Completed?: Yes  Screening Tests Health Maintenance  Topic Date Due  . OPHTHALMOLOGY EXAM  Never done  . HEMOGLOBIN A1C  06/08/2020  . INFLUENZA VACCINE  08/21/2020  . FOOT EXAM  12/09/2020  . MAMMOGRAM  02/03/2022  . TETANUS/TDAP  11/18/2023  . COLONOSCOPY (Pts 45-51yrs Insurance coverage will need to be confirmed)  08/24/2028  . DEXA SCAN  Completed  . COVID-19 Vaccine  Completed  . Hepatitis C Screening  Completed  . PNA vac Low Risk Adult  Completed  . HPV VACCINES  Aged Out    Health Maintenance  Health Maintenance Due  Topic Date Due  . OPHTHALMOLOGY EXAM  Never done    Colorectal cancer screening: Type of screening: Colonoscopy. Completed 08/25/2018. Repeat every 10 years  Mammogram status: Completed 02/04/2020. Repeat every year  Bone Density status: Completed 07/21/2018  Lung Cancer Screening: (Low Dose CT Chest recommended if Age 42-80 years,  30 pack-year currently smoking OR have quit w/in 15years.) does not qualify.   Lung Cancer Screening Referral: no  Additional Screening:  Hepatitis C Screening: does qualify; Completed 12/03/2017  Vision Screening: Recommended annual ophthalmology exams for early detection of glaucoma and other disorders of the eye. Is the patient up to date with their annual eye exam?  No  Who is the provider or what is the name of the office in which the patient attends annual eye exams? Dr. Clydene Pugh If pt is not established with a provider, would they like to be referred to a provider to establish care? No .   Dental Screening: Recommended annual dental exams for proper oral hygiene  Community Resource Referral / Chronic Care Management: CRR required this visit?  No   CCM required this  visit?  No      Plan:     I have personally reviewed and noted the following in the patient's chart:   . Medical and social history . Use of alcohol, tobacco or illicit drugs  . Current medications and supplements . Functional ability and status . Nutritional status . Physical activity . Advanced directives . List of other physicians . Hospitalizations, surgeries, and ER visits in previous 12 months . Vitals . Screenings to include cognitive, depression, and falls . Referrals and appointments  In addition, I have reviewed and discussed with patient certain preventive protocols, quality metrics, and best practice recommendations. A written personalized care plan for preventive services as well as general preventive health recommendations were provided to patient.     Barb Merino, LPN   [id number]   Nurse Notes:

## 2020-05-12 NOTE — Patient Instructions (Signed)
Ms. Allmon , Thank you for taking time to come for your Medicare Wellness Visit. I appreciate your ongoing commitment to your health goals. Please review the following plan we discussed and let me know if I can assist you in the future.   Screening recommendations/referrals: Colonoscopy: completed 08/25/2018 Mammogram: completed 02/04/2020 Bone Density: completed 07/21/2018 Recommended yearly ophthalmology/optometry visit for glaucoma screening and checkup Recommended yearly dental visit for hygiene and checkup  Vaccinations: Influenza vaccine: completed 11/25/2019, due 08/21/2020 Pneumococcal vaccine: completed 12/03/2017 Tdap vaccine: completed 11/17/2013, due 11/18/2023 Shingles vaccine: completed   Covid-19: 04/25/2020, 11/05/2019, 04/27/2019, 04/02/2019  Advanced directives: Advance directive discussed with you today.   Conditions/risks identified: none  Next appointment: Follow up in one year for your annual wellness visit    Preventive Care 65 Years and Older, Female Preventive care refers to lifestyle choices and visits with your health care provider that can promote health and wellness. What does preventive care include?  A yearly physical exam. This is also called an annual well check.  Dental exams once or twice a year.  Routine eye exams. Ask your health care provider how often you should have your eyes checked.  Personal lifestyle choices, including:  Daily care of your teeth and gums.  Regular physical activity.  Eating a healthy diet.  Avoiding tobacco and drug use.  Limiting alcohol use.  Practicing safe sex.  Taking low-dose aspirin every day.  Taking vitamin and mineral supplements as recommended by your health care provider. What happens during an annual well check? The services and screenings done by your health care provider during your annual well check will depend on your age, overall health, lifestyle risk factors, and family history of  disease. Counseling  Your health care provider may ask you questions about your:  Alcohol use.  Tobacco use.  Drug use.  Emotional well-being.  Home and relationship well-being.  Sexual activity.  Eating habits.  History of falls.  Memory and ability to understand (cognition).  Work and work Astronomer.  Reproductive health. Screening  You may have the following tests or measurements:  Height, weight, and BMI.  Blood pressure.  Lipid and cholesterol levels. These may be checked every 5 years, or more frequently if you are over 64 years old.  Skin check.  Lung cancer screening. You may have this screening every year starting at age 80 if you have a 30-pack-year history of smoking and currently smoke or have quit within the past 15 years.  Fecal occult blood test (FOBT) of the stool. You may have this test every year starting at age 25.  Flexible sigmoidoscopy or colonoscopy. You may have a sigmoidoscopy every 5 years or a colonoscopy every 10 years starting at age 69.  Hepatitis C blood test.  Hepatitis B blood test.  Sexually transmitted disease (STD) testing.  Diabetes screening. This is done by checking your blood sugar (glucose) after you have not eaten for a while (fasting). You may have this done every 1-3 years.  Bone density scan. This is done to screen for osteoporosis. You may have this done starting at age 39.  Mammogram. This may be done every 1-2 years. Talk to your health care provider about how often you should have regular mammograms. Talk with your health care provider about your test results, treatment options, and if necessary, the need for more tests. Vaccines  Your health care provider may recommend certain vaccines, such as:  Influenza vaccine. This is recommended every year.  Tetanus, diphtheria, and  acellular pertussis (Tdap, Td) vaccine. You may need a Td booster every 10 years.  Zoster vaccine. You may need this after age  71.  Pneumococcal 13-valent conjugate (PCV13) vaccine. One dose is recommended after age 90.  Pneumococcal polysaccharide (PPSV23) vaccine. One dose is recommended after age 20. Talk to your health care provider about which screenings and vaccines you need and how often you need them. This information is not intended to replace advice given to you by your health care provider. Make sure you discuss any questions you have with your health care provider. Document Released: 02/03/2015 Document Revised: 09/27/2015 Document Reviewed: 11/08/2014 Elsevier Interactive Patient Education  2017 [employer] Prevention in the Home Falls can cause injuries. They can happen to people of all ages. There are many things you can do to make your home safe and to help prevent falls. What can I do on the outside of my home?  Regularly fix the edges of walkways and driveways and fix any cracks.  Remove anything that might make you trip as you walk through a door, such as a raised step or threshold.  Trim any bushes or trees on the path to your home.  Use bright outdoor lighting.  Clear any walking paths of anything that might make someone trip, such as rocks or tools.  Regularly check to see if handrails are loose or broken. Make sure that both sides of any steps have handrails.  Any raised decks and porches should have guardrails on the edges.  Have any leaves, snow, or ice cleared regularly.  Use sand or salt on walking paths during winter.  Clean up any spills in your garage right away. This includes oil or grease spills. What can I do in the bathroom?  Use night lights.  Install grab bars by the toilet and in the tub and shower. Do not use towel bars as grab bars.  Use non-skid mats or decals in the tub or shower.  If you need to sit down in the shower, use a plastic, non-slip stool.  Keep the floor dry. Clean up any water that spills on the floor as soon as it happens.  Remove  soap buildup in the tub or shower regularly.  Attach bath mats securely with double-sided non-slip rug tape.  Do not have throw rugs and other things on the floor that can make you trip. What can I do in the bedroom?  Use night lights.  Make sure that you have a light by your bed that is easy to reach.  Do not use any sheets or blankets that are too big for your bed. They should not hang down onto the floor.  Have a firm chair that has side arms. You can use this for support while you get dressed.  Do not have throw rugs and other things on the floor that can make you trip. What can I do in the kitchen?  Clean up any spills right away.  Avoid walking on wet floors.  Keep items that you use a lot in easy-to-reach places.  If you need to reach something above you, use a strong step stool that has a grab bar.  Keep electrical cords out of the way.  Do not use floor polish or wax that makes floors slippery. If you must use wax, use non-skid floor wax.  Do not have throw rugs and other things on the floor that can make you trip. What can I do with my stairs?  Do not leave any items on the stairs.  Make sure that there are handrails on both sides of the stairs and use them. Fix handrails that are broken or loose. Make sure that handrails are as long as the stairways.  Check any carpeting to make sure that it is firmly attached to the stairs. Fix any carpet that is loose or worn.  Avoid having throw rugs at the top or bottom of the stairs. If you do have throw rugs, attach them to the floor with carpet tape.  Make sure that you have a light switch at the top of the stairs and the bottom of the stairs. If you do not have them, ask someone to add them for you. What else can I do to help prevent falls?  Wear shoes that:  Do not have high heels.  Have rubber bottoms.  Are comfortable and fit you well.  Are closed at the toe. Do not wear sandals.  If you use a  stepladder:  Make sure that it is fully opened. Do not climb a closed stepladder.  Make sure that both sides of the stepladder are locked into place.  Ask someone to hold it for you, if possible.  Clearly mark and make sure that you can see:  Any grab bars or handrails.  First and last steps.  Where the edge of each step is.  Use tools that help you move around (mobility aids) if they are needed. These include:  Canes.  Walkers.  Scooters.  Crutches.  Turn on the lights when you go into a dark area. Replace any light bulbs as soon as they burn out.  Set up your furniture so you have a clear path. Avoid moving your furniture around.  If any of your floors are uneven, fix them.  If there are any pets around you, be aware of where they are.  Review your medicines with your doctor. Some medicines can make you feel dizzy. This can increase your chance of falling. Ask your doctor what other things that you can do to help prevent falls. This information is not intended to replace advice given to you by your health care provider. Make sure you discuss any questions you have with your health care provider. Document Released: 11/03/2008 Document Revised: 06/15/2015 Document Reviewed: 02/11/2014 Elsevier Interactive Patient Education  2017 [employer].

## 2020-05-18 NOTE — Progress Notes (Signed)
BP 127/68   Pulse 77   Temp (!) 97.4 F (36.3 C)   Wt 176 lb 8 oz (80.1 kg)   SpO2 95%   BMI 26.84 kg/m    Subjective:    Patient ID: Marcia [name], female    DOB: 25-Mar-1950, 70 y.o.   MRN: [id number]  HPI: Marcia [name] is a 70 y.o. female  Chief Complaint  Patient presents with  . Hypertension  . Hyperlipidemia  . Diabetes  . Rectal Pain    Pain between buttocks and leg    HYPERTENSION / HYPERLIPIDEMIA Satisfied with current treatment? yes Duration of hypertension: years BP monitoring frequency: daily BP range: 116-120/80 BP medication side effects: no Past BP meds: clonidine and HCTZ Duration of hyperlipidemia: years Cholesterol medication side effects: no Cholesterol supplements: none Past cholesterol medications: pravastatin (pravachol) Medication compliance: excellent compliance Aspirin: no Recent stressors: no Recurrent headaches: no Visual changes: no Palpitations: no Dyspnea: no Chest pain: no Lower extremity edema: no Dizzy/lightheaded: no  DIABETES Hypoglycemic episodes:no Polydipsia/polyuria: no Visual disturbance: no Chest pain: no Paresthesias: no Glucose Monitoring: yes  Accucheck frequency: Daily  Fasting glucose: 120-195  Post prandial:  Evening:  Before meals: Taking Insulin?: no  Long acting insulin:  Short acting insulin: Blood Pressure Monitoring: daily Retinal Examination: has it scheduled for May 6. Foot Exam: Up to Date Diabetic Education: Not Completed Pneumovax: Up to Date Influenza: Up to Date Aspirin: no   Relevant past medical, surgical, family and social history reviewed and updated as indicated. Interim medical history since our last visit reviewed. Allergies and medications reviewed and updated.  Review of Systems  Per HPI unless specifically indicated above     Objective:    BP 127/68   Pulse 77   Temp (!) 97.4 F (36.3 C)   Wt 176 lb 8 oz (80.1 kg)   SpO2 95%   BMI 26.84 kg/m   Wt Readings  from Last 3 Encounters:  05/19/20 176 lb 8 oz (80.1 kg)  05/12/20 159 lb (72.1 kg)  03/24/20 169 lb 4 oz (76.8 kg)    Physical Exam  Results for orders placed or performed in visit on 12/10/19  VITAMIN D 25 Hydroxy (Vit-D Deficiency, Fractures)  Result Value Ref Range   Vit D, 25-Hydroxy 127.0 (H) 30.0 - 100.0 ng/mL  TSH  Result Value Ref Range   TSH 1.770 0.450 - 4.500 uIU/mL  Lipid Panel w/o Chol/HDL Ratio  Result Value Ref Range   Cholesterol, Total 151 100 - 199 mg/dL   Triglycerides 122 0 - 149 mg/dL   HDL 46 >48 mg/dL   VLDL Cholesterol Cal 22 5 - 40 mg/dL   LDL Chol Calc (NIH) 83 0 - 99 mg/dL  CBC with Differential/Platelet  Result Value Ref Range   WBC 6.5 3.4 - 10.8 x10E3/uL   RBC 4.82 3.77 - 5.28 x10E6/uL   Hemoglobin 14.5 11.1 - 15.9 g/dL   Hematocrit 25.0 03.7 - 46.6 %   MCV 87 79 - 97 fL   MCH 30.1 26.6 - 33.0 pg   MCHC 34.8 31.5 - 35.7 g/dL   RDW 04.8 88.9 - 16.9 %   Platelets 317 150 - 450 x10E3/uL   Neutrophils 64 Not Estab. %   Lymphs 28 Not Estab. %   Monocytes 6 Not Estab. %   Eos 1 Not Estab. %   Basos 1 Not Estab. %   Neutrophils Absolute 4.1 1.4 - 7.0 x10E3/uL   Lymphocytes Absolute 1.8 0.7 -  3.1 x10E3/uL   Monocytes Absolute 0.4 0.1 - 0.9 x10E3/uL   EOS (ABSOLUTE) 0.1 0.0 - 0.4 x10E3/uL   Basophils Absolute 0.1 0.0 - 0.2 x10E3/uL   Immature Granulocytes 0 Not Estab. %   Immature Grans (Abs) 0.0 0.0 - 0.1 x10E3/uL  Comprehensive metabolic panel  Result Value Ref Range   Glucose 85 65 - 99 mg/dL   BUN 14 8 - 27 mg/dL   Creatinine, Ser 9.67 0.57 - 1.00 mg/dL   GFR calc non Af Amer 72 >59 mL/min/1.73   GFR calc Af Amer 83 >59 mL/min/1.73   BUN/Creatinine Ratio 17 12 - 28   Sodium 140 134 - 144 mmol/L   Potassium 4.1 3.5 - 5.2 mmol/L   Chloride 100 96 - 106 mmol/L   CO2 26 20 - 29 mmol/L   Calcium 9.6 8.7 - 10.3 mg/dL   Total Protein 6.7 6.0 - 8.5 g/dL   Albumin 4.5 3.8 - 4.8 g/dL   Globulin, Total 2.2 1.5 - 4.5 g/dL   Albumin/Globulin  Ratio 2.0 1.2 - 2.2   Bilirubin Total 0.3 0.0 - 1.2 mg/dL   Alkaline Phosphatase 60 44 - 121 IU/L   AST 21 0 - 40 IU/L   ALT 18 0 - 32 IU/L  HgB A1c  Result Value Ref Range   Hgb A1c MFr Bld 5.8 (H) 4.8 - 5.6 %   Est. average glucose Bld gHb Est-mCnc 120 mg/dL      Assessment & Plan:   Problem List Items Addressed This Visit      Cardiovascular and Mediastinum   Hypertension associated with diabetes (HCC)    Chronic, stable.  BP at goal today in office.  Will continue clonidine 0.1 mg daily, hydrochlorothiazide 25 mg daily.  Labs ordered today.  Encouraged patient to check blood pressure at home and notify clinic if greater than 140/90 consistently.  Follow-up in 6 months.      Relevant Medications   cloNIDine (CATAPRES) 0.1 MG tablet   hydrochlorothiazide (HYDRODIURIL) 25 MG tablet   metFORMIN (GLUCOPHAGE) 500 MG tablet   pravastatin (PRAVACHOL) 40 MG tablet   Other Relevant Orders   Lipid panel   Basic metabolic panel   Vitamin B12   Urinalysis, Routine w reflex microscopic   HgB A1c   Aortic atherosclerosis (HCC) - Primary    Controlled.  Continue with Pravastatin.  Labs ordered today.       Relevant Medications   cloNIDine (CATAPRES) 0.1 MG tablet   hydrochlorothiazide (HYDRODIURIL) 25 MG tablet   pravastatin (PRAVACHOL) 40 MG tablet   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(HCC)   Relevant Medications   cloNIDine (CATAPRES) 0.1 MG tablet   hydrochlorothiazide (HYDRODIURIL) 25 MG tablet   metFORMIN (GLUCOPHAGE) 500 MG tablet   pravastatin (PRAVACHOL) 40 MG tablet   Other Relevant Orders   Lipid panel   Basic metabolic panel   Vitamin B12   Urinalysis, Routine w reflex microscopic   HgB A1c     Endocrine   Hyperlipidemia associated with type 2 diabetes mellitus (HCC)    Chronic, ongoing.  We will continue pravastatin 40 mg daily for now and check lipids, CMP today.  Will adjust medication as indicated.  Follow-up in 6 months.       Relevant Medications   metFORMIN (GLUCOPHAGE) 500 MG tablet   pravastatin (PRAVACHOL) 40 MG tablet   Other Relevant Orders   Lipid panel   Basic metabolic panel   Vitamin B12   Urinalysis,  Routine w reflex microscopic   HgB A1c     Other   Insomnia    Chronic, stable.  We will continue Belsomra 10 mg nightly to help with sleep.  Continue good sleep hygiene and trying to wind down before bed.  Follow-up in 6 months.       Other Visit Diagnoses    Sciatic nerve pain, unspecified laterality       Referral placed for Ortho.  Patient states she has had injections before which worked well for her.   Relevant Medications   gabapentin (NEURONTIN) 300 MG capsule   hydrOXYzine (VISTARIL) 25 MG capsule   sertraline (ZOLOFT) 50 MG tablet   Suvorexant (BELSOMRA) 10 MG TABS   tiZANidine (ZANAFLEX) 4 MG tablet   Other Relevant Orders   Ambulatory referral to Orthopedics       Follow up plan: Return in about 6 months (around 11/18/2020) for Physical and Fasting labs.

## 2020-05-19 ENCOUNTER — Other Ambulatory Visit: Payer: Self-pay

## 2020-05-19 ENCOUNTER — Ambulatory Visit (INDEPENDENT_AMBULATORY_CARE_PROVIDER_SITE_OTHER): Payer: Medicare Other | Admitting: Nurse Practitioner

## 2020-05-19 ENCOUNTER — Encounter: Payer: Self-pay | Admitting: Nurse Practitioner

## 2020-05-19 VITALS — BP 127/68 | HR 77 | Temp 97.4°F | Wt 176.5 lb

## 2020-05-19 DIAGNOSIS — M5430 Sciatica, unspecified side: Secondary | ICD-10-CM

## 2020-05-19 DIAGNOSIS — G4700 Insomnia, unspecified: Secondary | ICD-10-CM

## 2020-05-19 DIAGNOSIS — E785 Hyperlipidemia, unspecified: Secondary | ICD-10-CM

## 2020-05-19 DIAGNOSIS — E1159 Type 2 diabetes mellitus with other circulatory complications: Secondary | ICD-10-CM | POA: Diagnosis not present

## 2020-05-19 DIAGNOSIS — I152 Hypertension secondary to endocrine disorders: Secondary | ICD-10-CM | POA: Diagnosis not present

## 2020-05-19 DIAGNOSIS — E1169 Type 2 diabetes mellitus with other specified complication: Secondary | ICD-10-CM | POA: Diagnosis not present

## 2020-05-19 DIAGNOSIS — I700 Atherosclerosis of aorta: Secondary | ICD-10-CM | POA: Diagnosis not present

## 2020-05-19 LAB — URINALYSIS, ROUTINE W REFLEX MICROSCOPIC
Bilirubin, UA: NEGATIVE
Glucose, UA: NEGATIVE
Leukocytes,UA: NEGATIVE
Nitrite, UA: NEGATIVE
Protein,UA: NEGATIVE
RBC, UA: NEGATIVE
Specific Gravity, UA: 1.02 (ref 1.005–1.030)
Urobilinogen, Ur: 0.2 mg/dL (ref 0.2–1.0)
pH, UA: 6 (ref 5.0–7.5)

## 2020-05-19 MED ORDER — TIZANIDINE HCL 4 MG PO TABS: 4.0000 mg | ORAL_TABLET | Freq: Every evening | ORAL | 2 refills | Status: DC | PRN

## 2020-05-19 MED ORDER — CLONIDINE HCL 0.1 MG PO TABS: 0.1000 mg | ORAL_TABLET | Freq: Every day | ORAL | 1 refills | Status: DC

## 2020-05-19 MED ORDER — HYDROXYZINE PAMOATE 25 MG PO CAPS: 25.0000 mg | ORAL_CAPSULE | Freq: Three times a day (TID) | ORAL | 1 refills | Status: DC | PRN

## 2020-05-19 MED ORDER — METFORMIN HCL 500 MG PO TABS: 500.0000 mg | ORAL_TABLET | Freq: Two times a day (BID) | ORAL | 1 refills | Status: DC

## 2020-05-19 MED ORDER — GABAPENTIN 300 MG PO CAPS: 300.0000 mg | ORAL_CAPSULE | Freq: Two times a day (BID) | ORAL | 1 refills | Status: DC

## 2020-05-19 MED ORDER — PRAVASTATIN SODIUM 40 MG PO TABS: 40.0000 mg | ORAL_TABLET | Freq: Every day | ORAL | 1 refills | Status: DC

## 2020-05-19 MED ORDER — BELSOMRA 10 MG PO TABS: 10.0000 mg | ORAL_TABLET | Freq: Every day | ORAL | 5 refills | Status: DC

## 2020-05-19 MED ORDER — SERTRALINE HCL 50 MG PO TABS: 50.0000 mg | ORAL_TABLET | Freq: Every day | ORAL | 4 refills | Status: DC

## 2020-05-19 MED ORDER — FLUTICASONE PROPIONATE 50 MCG/ACT NA SUSP: 2.0000 | Freq: Every day | NASAL | 6 refills | Status: DC

## 2020-05-19 MED ORDER — HYDROCHLOROTHIAZIDE 25 MG PO TABS: 25.0000 mg | ORAL_TABLET | Freq: Every day | ORAL | 1 refills | Status: DC

## 2020-05-19 NOTE — Assessment & Plan Note (Signed)
Chronic, stable.  We will continue Belsomra 10 mg nightly to help with sleep.  Continue good sleep hygiene and trying to wind down before bed.  Follow-up in 6 months.

## 2020-05-19 NOTE — Assessment & Plan Note (Signed)
Chronic, stable.  BP at goal today in office.  Will continue clonidine 0.1 mg daily, hydrochlorothiazide 25 mg daily.  Labs ordered today.  Encouraged patient to check blood pressure at home and notify clinic if greater than 140/90 consistently.  Follow-up in 6 months.

## 2020-05-19 NOTE — Progress Notes (Signed)
Please let patient know that her urine shows that she has some Ketones in it.  This is a protein that is typically filtered by your kidneys.  There is nothing to do at this time. We will continue to monitor at future visits.

## 2020-05-19 NOTE — Assessment & Plan Note (Signed)
Chronic, ongoing.  We will continue pravastatin 40 mg daily for now and check lipids, CMP today.  Will adjust medication as indicated.  Follow-up in 6 months.

## 2020-05-19 NOTE — Assessment & Plan Note (Signed)
Controlled.  Continue with Pravastatin.  Labs ordered today.

## 2020-05-20 LAB — LIPID PANEL
Chol/HDL Ratio: 4.1 ratio (ref 0.0–4.4)
Cholesterol, Total: 143 mg/dL (ref 100–199)
HDL: 35 mg/dL — ABNORMAL LOW (ref >39)
LDL Chol Calc (NIH): 75 mg/dL (ref 0–99)
Triglycerides: 194 mg/dL — ABNORMAL HIGH (ref 0–149)
VLDL Cholesterol Cal: 33 mg/dL (ref 5–40)

## 2020-05-20 LAB — BASIC METABOLIC PANEL
BUN/Creatinine Ratio: 24 (ref 12–28)
BUN: 17 mg/dL (ref 8–27)
CO2: 25 mmol/L (ref 20–29)
Calcium: 9.2 mg/dL (ref 8.7–10.3)
Chloride: 100 mmol/L (ref 96–106)
Creatinine, Ser: 0.71 mg/dL (ref 0.57–1.00)
Glucose: 96 mg/dL (ref 65–99)
Potassium: 4.1 mmol/L (ref 3.5–5.2)
Sodium: 144 mmol/L (ref 134–144)
eGFR: 92 mL/min/{1.73_m2} (ref >59)

## 2020-05-20 LAB — HEMOGLOBIN A1C
Est. average glucose Bld gHb Est-mCnc: 126 mg/dL
Hgb A1c MFr Bld: 6 % — ABNORMAL HIGH (ref 4.8–5.6)

## 2020-05-20 LAB — VITAMIN B12: Vitamin B-12: >2000 pg/mL — ABNORMAL HIGH (ref 232–1245)

## 2020-05-22 ENCOUNTER — Telehealth: Payer: Self-pay

## 2020-05-22 NOTE — Telephone Encounter (Signed)
Called patient back, made aware of results. No further questions.

## 2020-05-22 NOTE — Telephone Encounter (Signed)
Copied from CRM [id number]. Topic: General - Other >> May 22, 2020  9:20 AM Jaquita Rector A wrote: Reason for CRM: Patient would like a call back about her lab results can be reached at Ph# [phone]

## 2020-05-22 NOTE — Progress Notes (Signed)
Please let Ms. Bran know that her Triglycerides were elevated from prior.  A1c increased to 6.0.  Other lab work looks good.  Follow up as discussed.

## 2020-05-26 DIAGNOSIS — H2513 Age-related nuclear cataract, bilateral: Secondary | ICD-10-CM | POA: Diagnosis not present

## 2020-05-26 DIAGNOSIS — E119 Type 2 diabetes mellitus without complications: Secondary | ICD-10-CM | POA: Diagnosis not present

## 2020-05-26 LAB — HM DIABETES EYE EXAM

## 2020-06-02 ENCOUNTER — Ambulatory Visit: Payer: Self-pay

## 2020-06-02 ENCOUNTER — Ambulatory Visit (INDEPENDENT_AMBULATORY_CARE_PROVIDER_SITE_OTHER): Payer: Medicare Other | Admitting: General Practice

## 2020-06-02 DIAGNOSIS — E785 Hyperlipidemia, unspecified: Secondary | ICD-10-CM

## 2020-06-02 DIAGNOSIS — E1169 Type 2 diabetes mellitus with other specified complication: Secondary | ICD-10-CM

## 2020-06-02 DIAGNOSIS — M545 Low back pain, unspecified: Secondary | ICD-10-CM

## 2020-06-02 DIAGNOSIS — M79604 Pain in right leg: Secondary | ICD-10-CM

## 2020-06-02 DIAGNOSIS — I152 Hypertension secondary to endocrine disorders: Secondary | ICD-10-CM

## 2020-06-02 DIAGNOSIS — E1159 Type 2 diabetes mellitus with other circulatory complications: Secondary | ICD-10-CM | POA: Diagnosis not present

## 2020-06-02 DIAGNOSIS — S76311A Strain of muscle, fascia and tendon of the posterior muscle group at thigh level, right thigh, initial encounter: Secondary | ICD-10-CM | POA: Diagnosis not present

## 2020-06-02 DIAGNOSIS — S76312A Strain of muscle, fascia and tendon of the posterior muscle group at thigh level, left thigh, initial encounter: Secondary | ICD-10-CM | POA: Diagnosis not present

## 2020-06-02 NOTE — Patient Instructions (Signed)
Chronic Care Management   Follow Up Note   06/02/2020 Name: Marcia [name] Alfred MRN: [id number] DOB: 01/25/1950  Marcia [name] Larrivee is enrolled in Chronic Case Management Program . .    Referred by: Marcia [name], Karen, NP Reason for referral : Chronic Care Management (RN CM Follow up for Chronic Dieease Management and Care Coordination Needs)   Marcia [name] Leverich is a 70 y.o. year old female who is a primary care patient of Marcia [name], Karen, NP. The care management team was consulted for assistance with care management and care coordination needs.    Review of patient status, including review of consultants reports, relevant laboratory and other test results, and collaboration with appropriate care team members and the patient's provider was performed as part of comprehensive patient evaluation and provision of chronic care management services.    Goals Addressed            This Visit's Progress   . COMPLETED: Increase water intake       Recommend drinking at least 4-5 glasses of water a day     . RN CM Monitor and Manage My Blood Sugar-Diabetes Type 2       Timeframe:  Long-Range Goal Priority:  High Start Date:   06/02/2020                          Expected End Date:  06/02/2021                     Follow Up Date 08/11/20    - check blood sugar at prescribed times - check blood sugar if [name] feel it is too high or too low - take the blood sugar log to all doctor visits    Why is this important?    Checking your blood sugar at home helps to keep it from getting very high or very low.   Writing the results in a diary or log helps the doctor know how to care for you.   Your blood sugar log should have the time, date and the results.   Also, write down the amount of insulin or other medicine that you take.   Other information, like what you ate, exercise done and how you were feeling, will also be helpful.     Notes: Patient currently on prednisone dose pack and encouraged to monitor  blood sugars closely.    . COMPLETED: RNCM: Pt-"[name] have been checking my blood sugars too because she said [name]  had diabetes number 2"       Current Barriers: Closing this goal and opening in ELS . Chronic Disease Management support, education, and care coordination needs related to HTN, HLD, DMII, Anxiety, and Depression . Currently does not have a blood pressure cuff  Clinical Goal(s) related to HTN, HLD, DMII, Anxiety, and Depression:  Over the next 120 days, patient will:  . Work with the care management team to address educational, disease management, and care coordination needs  . Begin or continue self health monitoring activities as directed today Measure and record cbg (blood glucose) BID times daily and Measure and record blood pressure 2 times per week . Call provider office for new or worsened signs and symptoms Blood glucose findings outside established parameters, Blood pressure findings outside established parameters, and New or worsened symptom related to anxiety  . Call care management team with questions or concerns . Verbalize basic understanding of patient centered plan of care established  today  Interventions related to HTN, HLD, DMII, Anxiety, and Depression:  . Evaluation of current treatment plans and patient's adherence to plan as established by provider.  The patient has a good understanding of her chronic conditions. The patient is happy to report that her blood sugars are staying around 108. 11-10-2019: The patient states that her blood sugars are staying down and are in the 100's around 107 and 108. The patient states her blood sugars are consistently staying at around 108.  . Assessed patient understanding of disease states.  The Patient is doing well and denies any new concerns at this time. 11-10-2019: The patient states that she is doing well and has no new concerns with her health at this time. Is thankful for the follow up by the [facility].   Marcia [name] Assessed patient's  education and care coordination needs.  Review of heart healthy/ADA diet. The patient is mindful of her food intake. She has switched over to sugar free products including ice cream, Reece cups, puddings and other sugar free products. Praised for positive lifestyle changes.  The patient verbalized her church was bringing her supper tonight. 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 The patient was helping with the wheelchair and it came down on her big toe. She has been soaking it in epsom salt and using peroxide on it.  09-15-2019: The patient states her toe is doing well. She lost the nail but a new one has started growing. Denies any further issues. 11-10-2019: Her toe nail has grown back now and the patient states she has had no other issues with her toe.  . Provided disease specific education to patient. Evaluation of blood pressure checks.  The patient is not taking blood pressures at home but feels her blood pressures are good. She is compliant with medications regimen. Denies any changes with her blood pressures. . Evaluation of exercise regimen. 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 She does ride the exercise bike at least 2 days a week for 20 minutes.  Encouraged activity.   . Evaluation of questions/concerns related to the COVID vaccines. The patient has had both vaccines and denies any concerns related to the vaccinations. 11-10-2019:The patient states she had a booster shot and her flu shot at [place] in [place].  She could not remember the date of the booster or the flu vaccine. Attempts to get the information from [employer] unsuccessful due to extended wait time.  Will attempt to get at another time.  Steele Sizer with appropriate clinical care team members regarding patient needs.  The patient denies any needs at this time. Knows the LCSW and Pharmacist are available as needed.   . Evaluation of upcoming appointments. The patient sees the pcp again 12-03-2019. She has an eye appointment coming up to have her eyes checked. She feels she is doing very well. Will continue to monitor.   Patient Self Care Activities related to HTN, HLD, DMII, Anxiety, and Depression:  . Patient is unable to independently self-manage chronic health conditions  Please see past updates related to this goal by clicking on the "Past Updates" button in the selected goal         Goals Addressed            This Visit's Progress   . COMPLETED: Increase water intake  Recommend drinking at least 4-5 glasses of water a day     . RN CM Monitor and Manage My Blood Sugar-Diabetes Type 2       Timeframe:  Long-Range Goal Priority:  High Start Date:   06/02/2020                          Expected End Date:  06/02/2021                     Follow Up Date 08/11/20    - check blood sugar at prescribed times - check blood sugar if [name] feel it is too high or too low - take the blood sugar log to all doctor visits    Why is this important?    Checking your blood sugar at home helps to keep it from getting very high or very low.   Writing the results in a diary or log helps the doctor know how to care for you.   Your blood sugar log should have the time, date and the results.   Also, write down the amount of insulin or other medicine that you take.   Other information, like what you ate, exercise done and how you were feeling, will also be helpful.     Notes: Patient currently on prednisone dose pack and encouraged to monitor blood sugars closely.    . COMPLETED: RNCM: Pt-"[name] have been checking my blood sugars too because she said [name]  had diabetes number 2"       Current Barriers: Closing this goal and opening in ELS . Chronic Disease Management support, education, and care coordination needs related to HTN, HLD, DMII, Anxiety, and Depression . Currently does not have a blood pressure  cuff  Clinical Goal(s) related to HTN, HLD, DMII, Anxiety, and Depression:  Over the next 120 days, patient will:  . Work with the care management team to address educational, disease management, and care coordination needs  . Begin or continue self health monitoring activities as directed today Measure and record cbg (blood glucose) BID times daily and Measure and record blood pressure 2 times per week . Call provider office for new or worsened signs and symptoms Blood glucose findings outside established parameters, Blood pressure findings outside established parameters, and New or worsened symptom related to anxiety  . Call care management team with questions or concerns . Verbalize basic understanding of patient centered plan of care established today  Interventions related to HTN, HLD, DMII, Anxiety, and Depression:  . Evaluation of current treatment plans and patient's adherence to plan as established by provider.  The patient has a good understanding of her chronic conditions. The patient is happy to report that her blood sugars are staying around 108. 11-10-2019: The patient states that her blood sugars are staying down and are in the 100's around 107 and 108. The patient states her blood sugars are consistently staying at around 108.  . Assessed patient understanding of disease states.  The Patient is doing well and denies any new concerns at this time. 11-10-2019: The patient states that she is doing well and has no new concerns with her health at this time. Is thankful for the follow up by the [facility].   Marcia [name] Assessed patient's education and care coordination needs.  Review of heart healthy/ADA diet. The patient is mindful of her food intake. She has switched over to sugar free products including ice cream, Reece cups,  puddings and other sugar free products. Praised for positive lifestyle changes.  The patient verbalized her church was bringing her supper tonight. 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 The patient was helping with the wheelchair and it came down on her big toe. She has been soaking it in epsom salt and using peroxide on it.  09-15-2019: The patient states her toe is doing well. She lost the nail but a new one has started growing. Denies any further issues. 11-10-2019: Her toe nail has grown back now and the patient states she has had no other issues with her toe.  . Provided disease specific education to patient. Evaluation of blood pressure checks.  The patient is not taking blood pressures at home but feels her blood pressures are good. She is compliant with medications regimen. Denies any changes with her blood pressures. . Evaluation of exercise regimen. 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 She does ride the exercise bike at least 2 days a week for 20 minutes.  Encouraged activity.   . Evaluation of questions/concerns related to the COVID vaccines. The patient has had both vaccines and denies any concerns related to the vaccinations. 11-10-2019:The patient states she had a booster shot and her flu shot at [facility] in [place].  She could not remember the date of the booster or the flu vaccine. Attempts to get the information from [employer] unsuccessful due to extended wait time.  Will attempt to get at another time.  Steele Sizer with appropriate clinical care team members regarding patient needs.  The patient denies any needs at this time. Knows the LCSW and Pharmacist are available as needed.  . Evaluation of upcoming appointments. The patient sees the pcp again 12-03-2019. She has an eye appointment coming up to have her eyes checked. She feels she is doing very well. Will continue to monitor.   Patient Self Care Activities related to HTN, HLD, DMII, Anxiety, and Depression:  . Patient is unable to independently self-manage chronic health conditions  Please  see past updates related to this goal by clicking on the "Past Updates" button in the selected goal         Telephone follow up appointment with care management team member scheduled for: 08-11-2020 at 3:45 pm  Virgina Norfolk RN, BSN Community Care Coordinator [place]  [facility] Mobile: [phone]

## 2020-06-02 NOTE — Chronic Care Management (AMB) (Signed)
Chronic Care Management   CCM RN Visit Note  06/02/2020 Name: Marcia [name] MRN: [id number] DOB: Apr 20, 1950  Subjective: Marcia [name] is a 70 y.o. year old female who is a primary care patient of Jon Billings, NP. The care management team was consulted for assistance with disease management and care coordination needs.    Engaged with patient by telephone for follow up visit in response to provider referral for case management and/or care coordination services.   Consent to Services:  The patient was given information about Chronic Care Management services, agreed to services, and gave verbal consent prior to initiation of services.  Please see initial visit note for detailed documentation.   Patient agreed to services and verbal consent obtained.   Assessment: Review of patient past medical history, allergies, medications, health status, including review of consultants reports, laboratory and other test data, was performed as part of comprehensive evaluation and provision of chronic care management services.   SDOH (Social Determinants of Health) assessments and interventions performed:    CCM Care Plan  No Known Allergies  Outpatient Encounter Medications as of 06/02/2020  Medication Sig  . cloNIDine (CATAPRES) 0.1 MG tablet Take 1 tablet (0.1 mg total) by mouth daily.  . fluticasone (FLONASE) 50 MCG/ACT nasal spray Place 2 sprays into both nostrils daily.  [name] gabapentin (NEURONTIN) 300 MG capsule Take 1 capsule (300 mg total) by mouth 2 (two) times daily.  . hydrochlorothiazide (HYDRODIURIL) 25 MG tablet Take 1 tablet (25 mg total) by mouth daily.  . hydrOXYzine (VISTARIL) 25 MG capsule Take 1 capsule (25 mg total) by mouth 3 (three) times daily as needed for anxiety.  . metFORMIN (GLUCOPHAGE) 500 MG tablet Take 1 tablet (500 mg total) by mouth 2 (two) times daily with a meal.  . pravastatin (PRAVACHOL) 40 MG tablet Take 1 tablet (40 mg total) by mouth daily.  . sertraline  (ZOLOFT) 50 MG tablet Take 1 tablet (50 mg total) by mouth daily.  . Suvorexant (BELSOMRA) 10 MG TABS Take 10 mg by mouth at bedtime.  [name] tiZANidine (ZANAFLEX) 4 MG tablet Take 1 tablet (4 mg total) by mouth at bedtime as needed for muscle spasms.   No facility-administered encounter medications on file as of 06/02/2020.    Patient Active Problem List   Diagnosis Date Noted  . 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([place]) 09/73/5329  . Aortic atherosclerosis ([place]) 04/18/2020  . Colon cancer screening   . Osteopenia 07/23/2018  . Chronic low back pain 06/03/2018  . Insomnia 12/09/2017  . Anxiety 04/19/2017  . Hypertension associated with diabetes ([place]) 11/13/2016  . Hyperlipidemia associated with type 2 diabetes mellitus ([place]) 11/13/2016  . 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   Lab Results  Component Value Date   CREATININE 0.71 05/19/2020   CREATININE 0.84 12/10/2019   CREATININE 0.81 05/21/2019 .   Lab Results  Component Value Date   EGFR 92 05/19/2020 .   Current Barriers:  [name] Knowledge Deficits related to  medications used for management of diabetes . Currently taking prednisone pack 06/02/20 . Does not attend all scheduled provider appointments . Does not maintain contact with provider office Case Manager Clinical Goal(s):  . patient will demonstrate improved adherence to prescribed treatment plan for diabetes self care/management as evidenced by: daily monitoring and recording of CBG  adherence to ADA/ carb  modified diet adherence to prescribed medication regimen Interventions:  . Collaboration with Jon Billings, NP regarding development and update of comprehensive plan of care as evidenced by provider attestation and co-signature . Inter-disciplinary care team collaboration (see longitudinal plan of care) . Provided education to patient about basic DM disease process . Reviewed medications with patient and discussed importance of medication adherence . Discussed plans with patient for ongoing care management follow up and provided patient with direct contact information for care management team . Reviewed scheduled/upcoming provider appointments including: PCP October 2022 . Advised patient, providing education and rationale, to check cbg daily and record, calling PCP for findings outside established parameters.   . Review of patient status, including review of consultants reports, relevant laboratory and other test results, and medications completed. Self-Care Activities - Self administers oral medications as prescribed Attends all scheduled provider appointments Checks blood sugars as prescribed and utilize hyper and hypoglycemia protocol as needed Adheres to prescribed ADA/carb modified Patient Goals: - check blood sugar at prescribed times - check blood sugar if I feel it is too high or too low - enter blood sugar readings and medication or insulin into daily log - take the blood sugar log to all doctor visits - drink 6 to 8 glasses of water each day - manage portion size - keep appointment with eye doctor.  Saw eye doctor on 05/26/20 with follow up in October 2022 - check feet daily for cuts, sores or redness - wear comfortable, well-fitting shoes   - barriers to adherence to treatment plan identified - blood glucose monitoring encouraged - blood glucose readings reviewed - use of blood glucose monitoring log promoted  Follow Up Plan: Telephone follow up appointment with care  management team member scheduled for:08/11/20 at 3:45pm   imeframe:  Long-Range Goal Priority:  High Start Date:   06/02/2020                          Expected End Date:  06/02/2021                     Follow Up Date 08/11/20    - check blood sugar at prescribed times - check blood sugar if I feel it is too high or too low - take the blood sugar log to all doctor visits    Why is this important?    Checking your blood sugar at home helps to keep it from getting very high or very low.   Writing the results in a diary or log helps the doctor know how to care for you.   Your blood sugar log should have the time, date and the results.   Also, write down the amount of insulin or other medicine that you take.   Other information, like what you ate, exercise done and how you were feeling, will also be helpful.     Notes: Patient currently on prednisone dose pack and encouraged to monitor blood sugars closely.   Task: RN CM Alleviate Barriers to Glycemic Management   Note:   Care Management Activities:    -  barriers to adherence to treatment plan identified - blood glucose monitoring encouraged - blood glucose readings reviewed - use of blood glucose monitoring log promoted       Care Plan : RN CM Chronic Pain (Adult)  Updates made by Inge Rise, RN since 06/02/2020 12:00 AM    Problem: RN CM Pain Management Plan (Chronic Pain)   Priority: High    Long-Range Goal: RN CM Pain Management Plan Developed   Priority: High  Note:   Current Barriers:  [name] Knowledge Deficits related to managing acute/chronic pain . Non-adherence to scheduled provider appointments . Non-adherence to prescribed medication regimen . Difficulty obtaining medications . Chronic Disease Management support and education needs related to chronic pain . Unable to independently to manage back pain and leg pain . Does not attend all scheduled provider appointments . Does not maintain contact with  provider office Nurse Case Manager Clinical Goal(s):  . patient will verbalize understanding of plan for managing pain . patient will attend all scheduled medical appointments: seeing a specialist on 06/23/20 and follow up with PCP in October 2022 . patient will demonstrate use of different relaxation  skills and/or diversional activities to assist with pain reduction (distraction, imagery, relaxation, massage, acupressure, TENS, heat, and cold application . patient will report pain at a level less than 3 to 4 on a 10-10 rating scale . patient will use pharmacological and nonpharmacological pain relief strategies . patient will verbalize acceptable level of pain relief and ability to engage in desired activities . patient will engage in desired activities without an increase in pain level Interventions:  . Collaboration with Jon Billings, NP regarding development and update of comprehensive plan of care as evidenced by provider attestation and co-signature . Inter-disciplinary care team collaboration (see longitudinal plan of care) . - effectiveness of pharmacologic therapy monitored . - medication-induced side effects managed . - misuse of pain medication assessed . - motivation and barriers to change assessed and addressed . - pain assessed . - pain treatment goals reviewed . Evaluation of current treatment plan related to pain in back and bilateral legs and patient's adherence to plan as established by provider. . Advised patient to call the office with any changes in intensity of pain . Provided education to patient [id number] therapies, adequate rest and working with PT.  Educated on falls prevention and safety . Reviewed medications with patient and discussed compliance.  Is starting prednisone dose pack and muscle relaxer. [name] Discussed plans with patient for ongoing care management follow up and provided patient with direct contact information for care management team . Allow  patient to maintain a diary of pain ratings, timing, precipitating events, medications, treatments, and what works best to relieve pain,  . Refer to support groups and self-help groups . Educate patient about the use of pharmacological interventions for pain management- antianxiety, antidepressants, NSAIDS, opioid analgesics,  . Explain the importance of lifestyle modifications to effective pain management  Patient Goals/Self Care Activities:  . - pain assessed . - pain management plan developed . - pain treatment goals reviewed . - patient response to treatment assessed  . Self-administers medications as prescribed . Attends all scheduled provider appointments . Calls pharmacy for medication refills . Calls provider office for new concerns or questions Follow Up Plan: Telephone follow up appointment with care management team member scheduled for:08/11/20 at 3:45pm      Task: RN CM Partner to Develop Chronic Pain Management Plan   Note:   Care Management Activities:    -  pain assessed - pain management plan developed - pain treatment goals reviewed - patient response to treatment assessed    Notes:    Care Plan : RM CM Hypertension (Adult)  Updates made by Inge Rise, RN since 06/02/2020 12:00 AM    Problem: RN CM HTN   Priority: Medium    Long-Range Goal: RN CM Hypertension Monitored   Priority: Medium  Note:   Objective:  . Last practice recorded BP readings:  . BP Readings from Last 3 Encounters: .  05/19/20 . 127/68 .  03/24/20 . 106/67 .  01/10/20 . (!) 147/71 .    [name] Most recent eGFR/CrCl:  Lab Results  Component Value Date   EGFR 92 05/19/2020 .    No components found for: CRCL Current Barriers:  [name] Knowledge Deficits related to basic understanding of hypertension pathophysiology and self care management . Knowledge Deficits related to understanding of medications prescribed for management of hypertension . Does not attend all scheduled provider  appointments . Does not maintain contact with provider office Case Manager Clinical Goal(s):  . patient will verbalize understanding of plan for hypertension management . patient will attend all scheduled medical appointments: 11/11/20 with PCP . patient will demonstrate improved adherence to prescribed treatment plan for hypertension as evidenced by taking all medications as prescribed, monitoring and recording blood pressure as directed, adhering to low sodium/DASH diet . patient will demonstrate improved health management independence as evidenced by checking blood pressure as directed and notifying PCP if SBP>150 or DBP > 90, taking all medications as prescribe, and adhering to a low sodium diet as discussed. . patient will verbalize basic understanding of hypertension disease process and self health management plan as evidenced by heart healthy diet and working with CCM team to manage health and well being Interventions:  . Collaboration with Jon Billings, NP regarding development and update of comprehensive plan of care as evidenced by provider attestation and co-signature . Inter-disciplinary care team collaboration (see longitudinal plan of care) . Evaluation of current treatment plan related to hypertension self management and patient's adherence to plan as established by provider. . Provided education to patient re: stroke prevention, s/s of heart attack and stroke, DASH diet, complications of uncontrolled blood pressure . Reviewed medications with patient and discussed importance of compliance . Discussed plans with patient for ongoing care management follow up and provided patient with direct contact information for care management team . Advised patient, providing education and rationale, to monitor blood pressure daily and record, calling PCP for findings outside established parameters.  . Reviewed scheduled/upcoming provider appointments including: PCP 11/09/20 Self-Care  Activities: - Self administers medications as prescribed Attends all scheduled provider appointments Calls provider office for new concerns, questions, or BP outside discussed parameters Checks BP and records as discussed Follows a low sodium diet/DASH diet Patient Goals: - check blood pressure weekly - choose a place to take my blood pressure (home, clinic or office, retail store) - write blood pressure results in a log or diary - agree to work together to make changes - ask questions to understand - learn about high blood pressure - choices provided - decision-making supported - reassurance provided   Follow Up Plan: Telephone follow up appointment with care management team member scheduled for: 08/11/20 at 3:45pm   Task: RN CM Identify and Monitor Blood Pressure Elevation   Note:   Care Management Activities:    - blood pressure trends reviewed - depression screen reviewed     Care Plan :  RN CM HLD Manangement  Updates made by Inge Rise, RN since 06/02/2020 12:00 AM    Problem: RN CM Hyperlipidemia   Priority: Medium    Long-Range Goal: RN CM Hyperlipidemia Management   Priority: Medium  Note:   Current Barriers:  . Poorly controlled hyperlipidemia, complicated by DM, HTN . Current antihyperlipidemic regimen: on Pravachol 40 mg daily . Most recent lipid panel:     Component Value Date/Time   CHOL 143 05/19/2020 1057   TRIG 194 (H) 05/19/2020 1057   HDL 35 (L) 05/19/2020 1057   CHOLHDL 4.1 05/19/2020 1057   LDLCALC 75 05/19/2020 1057 .   [name] ASCVD risk enhancing conditions: age >7, DM, HTN, former smoker . Unable to independently manage HLD . Does not attend all scheduled provider appointments . Does not contact provider office for questions/concerns RN Care Manager Clinical Goal(s):  . patient will work with Consulting civil engineer, providers, and care team towards execution of optimized self-health management plan . patient will verbalize understanding of  plan for effective management for HLD . patient will work with RN CM and PCP to address needs related to HLD . patient will attend all scheduled medical appointments: will follow up with PCP in October 2022 Interventions: . Collaboration with Jon Billings, NP regarding development and update of comprehensive plan of care as evidenced by provider attestation and co-signature . Inter-disciplinary care team collaboration (see longitudinal plan of care) . Medication review performed; medication list updated in electronic medical record.  Bertram Savin care team collaboration (see longitudinal plan of care) . Referred to pharmacy team for assistance with HLD medication management . Evaluation of current treatment plan related to HLD and patient's adherence to plan as established by provider. . Advised patient to call the office with any questions or concerns . Provided education to patient re: heart healthy diet, adequate rest and medication compliance . Reviewed scheduled/upcoming provider appointments including: follow up with PCP in October 2022 Patient Goals/Self-Care Activities: - keep a list of all the medicines I take; vitamins and herbals too - drink 6 to 8 glasses of water each day - eat 3 to 5 servings of fruits and vegetables each day - manage portion size - be open to making changes - learn my personal risk factors - choices provided - decision-making supported - reassurance provided   Follow Up Plan: Telephone follow up appointment with care management team member scheduled for:08/11/20 at 3:45pm     Task: RM CM Mutually Develop and [place] of Patient Goals   Note:   Care Management Activities:    - choices provided - decision-making supported - reassurance provided        Plan:Telephone follow up appointment with care management team member scheduled for:  08/11/20 at 3:45pm  Salvatore Marvel RN, [facility] Mobile: [phone]

## 2020-06-02 NOTE — Chronic Care Management (AMB) (Signed)
   06/02/2020  Marcia [name] Aug 27, 1950 [id number]  Error in charting

## 2020-07-18 NOTE — Addendum Note (Signed)
Addended by: Marlowe Sax on: 07/18/2020 09:59 AM   Modules accepted: Orders, Level of Service, SmartSet

## 2020-07-18 NOTE — Progress Notes (Signed)
This encounter was created in error - please disregard.

## 2020-08-03 DIAGNOSIS — Z20822 Contact with and (suspected) exposure to covid-19: Secondary | ICD-10-CM | POA: Diagnosis not present

## 2020-08-08 DIAGNOSIS — Z20822 Contact with and (suspected) exposure to covid-19: Secondary | ICD-10-CM | POA: Diagnosis not present

## 2020-08-08 DIAGNOSIS — U071 COVID-19: Secondary | ICD-10-CM | POA: Diagnosis not present

## 2020-08-09 ENCOUNTER — Telehealth: Payer: Medicare Other | Admitting: Nurse Practitioner

## 2020-08-11 ENCOUNTER — Telehealth: Payer: Self-pay

## 2020-08-15 ENCOUNTER — Telehealth: Payer: Self-pay | Admitting: General Practice

## 2020-08-15 ENCOUNTER — Ambulatory Visit (INDEPENDENT_AMBULATORY_CARE_PROVIDER_SITE_OTHER): Payer: Medicare Other | Admitting: General Practice

## 2020-08-15 DIAGNOSIS — G8929 Other chronic pain: Secondary | ICD-10-CM

## 2020-08-15 DIAGNOSIS — E1159 Type 2 diabetes mellitus with other circulatory complications: Secondary | ICD-10-CM | POA: Diagnosis not present

## 2020-08-15 DIAGNOSIS — E1169 Type 2 diabetes mellitus with other specified complication: Secondary | ICD-10-CM

## 2020-08-15 DIAGNOSIS — E785 Hyperlipidemia, unspecified: Secondary | ICD-10-CM | POA: Diagnosis not present

## 2020-08-15 DIAGNOSIS — I152 Hypertension secondary to endocrine disorders: Secondary | ICD-10-CM

## 2020-08-15 DIAGNOSIS — M545 Low back pain, unspecified: Secondary | ICD-10-CM

## 2020-08-15 NOTE — Patient Instructions (Signed)
Visit Information  PATIENT GOALS:  Goals Addressed             This Visit's Progress    RN CM Monitor and Manage My Blood Sugar-Diabetes Type 2       Timeframe:  Long-Range Goal Priority:  High Start Date:   06/02/2020                          Expected End Date:  06/02/2021                     Follow Up Date 10/06/20    - check blood sugar at prescribed times - check blood sugar if I feel it is too high or too low - take the blood sugar log to all doctor visits    Why is this important?   Checking your blood sugar at home helps to keep it from getting very high or very low.  Writing the results in a diary or log helps the doctor know how to care for you.  Your blood sugar log should have the time, date and the results.  Also, write down the amount of insulin or other medicine that you take.  Other information, like what you ate, exercise done and how you were feeling, will also be helpful.     Notes: Patient currently on prednisone dose pack and encouraged to monitor blood sugars closely. 08-15-2020: The patient is doing well and her blood sugars regulated. Readings are 128, 132, 152. Will continue to monitor.         The patient verbalized understanding of instructions, educational materials, and care plan provided today and declined offer to receive copy of patient instructions, educational materials, and care plan.   Telephone follow up appointment with care management team member scheduled for: 10-06-2020 at 345 pm  Alto Denver RN, MSN, CCM Community Care Coordinator [place]  [facility] Mobile: [phone]

## 2020-08-15 NOTE — Chronic Care Management (AMB) (Signed)
Chronic Care Management   CCM RN Visit Note  08/15/2020 Name: Marcia [name] MRN: [id number] DOB: 1950/01/25  Subjective: Marcia [name] is a 70 y.o. year old female who is a primary care patient of Jon Billings, NP. The care management team was consulted for assistance with disease management and care coordination needs.    Engaged with patient by telephone for follow up visit in response to provider referral for case management and/or care coordination services.   Consent to Services:  The patient was given information about Chronic Care Management services, agreed to services, and gave verbal consent prior to initiation of services.  Please see initial visit note for detailed documentation.   Patient agreed to services and verbal consent obtained.   Assessment: Review of patient past medical history, allergies, medications, health status, including review of consultants reports, laboratory and other test data, was performed as part of comprehensive evaluation and provision of chronic care management services.   SDOH (Social Determinants of Health) assessments and interventions performed:    CCM Care Plan  No Known Allergies  Outpatient Encounter Medications as of 08/15/2020  Medication Sig   cloNIDine (CATAPRES) 0.1 MG tablet Take 1 tablet (0.1 mg total) by mouth daily.   fluticasone (FLONASE) 50 MCG/ACT nasal spray Place 2 sprays into both nostrils daily.   gabapentin (NEURONTIN) 300 MG capsule Take 1 capsule (300 mg total) by mouth 2 (two) times daily.   hydrochlorothiazide (HYDRODIURIL) 25 MG tablet Take 1 tablet (25 mg total) by mouth daily.   hydrOXYzine (VISTARIL) 25 MG capsule Take 1 capsule (25 mg total) by mouth 3 (three) times daily as needed for anxiety.   metFORMIN (GLUCOPHAGE) 500 MG tablet Take 1 tablet (500 mg total) by mouth 2 (two) times daily with a meal.   pravastatin (PRAVACHOL) 40 MG tablet Take 1 tablet (40 mg total) by mouth daily.   sertraline (ZOLOFT)  50 MG tablet Take 1 tablet (50 mg total) by mouth daily.   Suvorexant (BELSOMRA) 10 MG TABS Take 10 mg by mouth at bedtime.   tiZANidine (ZANAFLEX) 4 MG tablet Take 1 tablet (4 mg total) by mouth at bedtime as needed for muscle spasms.   No facility-administered encounter medications on file as of 08/15/2020.    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    Conditions to be addressed/monitored:HTN, HLD, DMII, and chronic back pain   Care Plan : RN CM Diabetes Type 2 (Adult)  Updates made by Vanita Ingles since 08/15/2020 12:00 AM     Problem: RN CM Glycemic Management (Diabetes, Type 2)   Priority: Medium     Long-Range Goal: RNCM: Management of DM   Start Date: 06/02/2020  Expected End Date: 04/12/2021  This Visit's Progress: On track  Priority: Medium  Note:   Objective:  Lab Results  Component Value Date   HGBA1C 6.0 (H) 05/19/2020    Lab Results  Component Value Date   CREATININE 0.71 05/19/2020   CREATININE 0.84 12/10/2019   CREATININE 0.81 05/21/2019   Lab Results  Component Value Date   EGFR 92 05/19/2020  Current Barriers:  Knowledge Deficits related to medications used for management of diabetes Currently taking prednisone pack 06/02/20- completed and blood sugars are WNL Does not attend all scheduled provider appointments Does not maintain contact with provider office Case Manager Clinical Goal(s):  patient will demonstrate improved adherence to prescribed treatment plan for diabetes self care/management as evidenced by: daily  monitoring and recording of CBG  adherence to ADA/ carb modified diet adherence to prescribed medication regimen Interventions:  Collaboration with Jon Billings, NP regarding development and update of comprehensive plan of care as evidenced by provider attestation and co-signature Inter-disciplinary care team collaboration (see longitudinal plan of care) Provided education to patient about basic DM disease process Reviewed medications with patient and discussed importance of medication adherence Discussed plans with patient for ongoing care management follow up and provided patient with direct contact information for care management team Reviewed scheduled/upcoming provider appointments including: PCP October 2022 Advised patient, providing education and rationale, to check cbg daily and record, calling PCP for findings outside established parameters.  08-15-2020: The patient is doing well and denies any new issues with her DM management. Readings are 128 to 152. She did have COVID a couple of weeks ago but it was only sinus drainage. She went to the walk in clinic and was tested. She denies any issues post COVID or DM at this time.  Review of patient status, including review of consultants reports, relevant laboratory and other test results, and medications completed. Self-Care Activities - Self administers oral medications as prescribed Attends all scheduled provider appointments Checks blood sugars as prescribed and utilize hyper and hypoglycemia protocol as needed Adheres to prescribed ADA/carb modified Patient Goals: - check blood sugar at prescribed times - check blood sugar if I feel it is too high or too low - enter blood sugar readings and medication or insulin into daily log - take the blood sugar log to all doctor visits - drink 6 to 8 glasses of water each day - manage portion size - keep appointment with eye doctor.  Saw eye doctor on 05/26/20 with follow up in October 2022 -  check feet daily for cuts, sores or redness - wear comfortable, well-fitting shoes   - barriers to adherence to treatment plan identified - blood glucose monitoring encouraged - blood glucose readings reviewed - use of blood glucose monitoring log promoted  Follow Up Plan: Telephone follow up appointment with care management team member scheduled for: 10-06-20 at 3:45pm      Task: RN CM Alleviate Barriers to Glycemic Management Completed 08/15/2020  Outcome: Positive  Note:   Care Management Activities:    - barriers to adherence to treatment plan identified - blood glucose monitoring encouraged - blood glucose readings reviewed - use of blood glucose monitoring log promoted        Care Plan : RN CM Chronic Pain (Adult)  Updates made by Vanita Ingles since 08/15/2020 12:00 AM     Problem: RN CM Pain Management Plan (Chronic Pain)   Priority: High     Long-Range Goal: RN CM Pain Management Plan Developed   Start Date: 06/02/2020  Expected End Date: 04/12/2021  This Visit's Progress: On track  Priority: High  Note:   Current Barriers:  Knowledge Deficits related to managing acute/chronic pain Non-adherence to scheduled provider appointments Non-adherence to prescribed medication regimen Difficulty obtaining medications Chronic Disease Management support and education needs related to chronic pain Unable to independently to manage back pain and leg pain Does not  attend all scheduled provider appointments Does not maintain contact with provider office Nurse Case Manager Clinical Goal(s):  patient will verbalize understanding of plan for managing pain patient will attend all scheduled medical appointments: seeing a specialist on 06/23/20 and follow up with PCP in October 2022 patient will demonstrate use of different relaxation  skills and/or diversional activities to assist with pain reduction (distraction, imagery, relaxation, massage, acupressure, TENS, heat, and cold  application patient will report pain at a level less than 3 to 4 on a 10-10 rating scale patient will use pharmacological and nonpharmacological pain relief strategies patient will verbalize acceptable level of pain relief and ability to engage in desired activities patient will engage in desired activities without an increase in pain level Interventions:  Collaboration with Jon Billings, NP regarding development and update of comprehensive plan of care as evidenced by provider attestation and co-signature Inter-disciplinary care team collaboration (see longitudinal plan of care) - effectiveness of pharmacologic therapy monitored - medication-induced side effects managed - misuse of pain medication assessed - motivation and barriers to change assessed and addressed - pain assessed - pain treatment goals reviewed Evaluation of current treatment plan related to pain in back and bilateral legs and patient's adherence to plan as established by provider. 08-15-2020: The patient states she has her pain under control at this time. She denies any new concerns with pain. Will likely close goal if she is stable at next outreach.  Advised patient to call the office with any changes in intensity of pain Provided education to patient BS:[id number] therapies, adequate rest and working with PT.  Educated on falls prevention and safety Reviewed medications with patient and discussed compliance.  Is starting prednisone dose pack and muscle relaxer. 08-15-2020: Has completed dose pack and has no further issues. Will continue to monitor.  Discussed plans with patient for ongoing care management follow up and provided patient with direct contact information for care management team Allow patient to maintain a diary of pain ratings, timing, precipitating events, medications, treatments, and what works best to relieve pain,  Refer to support groups and self-help groups Educate patient about the use of  pharmacological interventions for pain management- antianxiety, antidepressants, NSAIDS, opioid analgesics,  Explain the importance of lifestyle modifications to effective pain management  Patient Goals/Self Care Activities:  - pain assessed- 08-15-2020: Denies pain at this time.  - pain management plan developed - pain treatment goals reviewed - patient response to treatment assessed  Self-administers medications as prescribed Attends all scheduled provider appointments Calls pharmacy for medication refills Calls provider office for new concerns or questions Follow Up Plan: Telephone follow up appointment with care management team member scheduled for:9-16-/22 at 3:45pm       Task: RN CM Partner to Develop Chronic Pain Management Plan Completed 08/15/2020  Outcome: Positive  Note:   Care Management Activities:    - pain assessed - pain management plan developed - pain treatment goals reviewed - patient response to treatment assessed    Notes:     Care Plan : RM CM Hypertension (Adult)  Updates made by Vanita Ingles since 08/15/2020 12:00 AM     Problem: RN CM HTN   Priority: Medium     Long-Range Goal: RN CM Hypertension Monitored   Start Date: 06/02/2020  Expected End Date: 08/10/2021  This Visit's Progress: On track  Priority: Medium  Note:   Objective:  Last practice recorded BP readings:  BP Readings from Last 3 Encounters:  05/19/20 127/68  03/24/20  106/67  01/10/20 (!) 147/71     Most recent eGFR/CrCl:  Lab Results  Component Value Date   EGFR 92 05/19/2020    No components found for: CRCL Current Barriers:  Knowledge Deficits related to basic understanding of hypertension pathophysiology and self care management Knowledge Deficits related to understanding of medications prescribed for management of hypertension Does not attend all scheduled provider appointments Does not maintain contact with provider office Case Manager Clinical Goal(s):  patient will  verbalize understanding of plan for hypertension management patient will attend all scheduled medical appointments: 11/11/20 with PCP patient will demonstrate improved adherence to prescribed treatment plan for hypertension as evidenced by taking all medications as prescribed, monitoring and recording blood pressure as directed, adhering to low sodium/DASH diet patient will demonstrate improved health management independence as evidenced by checking blood pressure as directed and notifying PCP if SBP>150 or DBP > 90, taking all medications as prescribe, and adhering to a low sodium diet as discussed. patient will verbalize basic understanding of hypertension disease process and self health management plan as evidenced by heart healthy diet and working with CCM team to manage health and well being Interventions:  Collaboration with Jon Billings, NP regarding development and update of comprehensive plan of care as evidenced by provider attestation and co-signature Inter-disciplinary care team collaboration (see longitudinal plan of care) Evaluation of current treatment plan related to hypertension self management and patient's adherence to plan as established by provider. 08-14-2020: The patient states her blood pressures are stable. States readings have been in normal range. Even 2 weeks ago when she had COVID. She denies any issues related to HTN Provided education to patient re: stroke prevention, s/s of heart attack and stroke, DASH diet, complications of uncontrolled blood pressure Reviewed medications with patient and discussed importance of compliance. 08-15-2020: States compliance with medications.  Discussed plans with patient for ongoing care management follow up and provided patient with direct contact information for care management team Advised patient, providing education and rationale, to monitor blood pressure daily and record, calling PCP for findings outside established parameters.   Reviewed scheduled/upcoming provider appointments including: PCP 11/09/20 Self-Care Activities: - Self administers medications as prescribed Attends all scheduled provider appointments Calls provider office for new concerns, questions, or BP outside discussed parameters Checks BP and records as discussed Follows a low sodium diet/DASH diet Patient Goals: - check blood pressure weekly - choose a place to take my blood pressure (home, clinic or office, retail store) - write blood pressure results in a log or diary - agree to work together to make changes - ask questions to understand - learn about high blood pressure - choices provided - decision-making supported - reassurance provided   Follow Up Plan: Telephone follow up appointment with care management team member scheduled for: 10-06-20 at 3:45pm    Task: RN CM Identify and Monitor Blood Pressure Elevation Completed 08/15/2020  Outcome: Positive  Note:   Care Management Activities:    - blood pressure trends reviewed - depression screen reviewed      Care Plan : RN CM HLD Manangement  Updates made by Vanita Ingles since 08/15/2020 12:00 AM     Problem: RN CM Hyperlipidemia   Priority: Medium     Long-Range Goal: RN CM Hyperlipidemia Management   Start Date: 06/02/2020  Expected End Date: 06/11/2021  This Visit's Progress: On track  Priority: Medium  Note:   Current Barriers:  Poorly controlled hyperlipidemia, complicated by DM, HTN Current antihyperlipidemic regimen: on  Pravachol 40 mg daily Most recent lipid panel:     Component Value Date/Time   CHOL 143 05/19/2020 1057   TRIG 194 (H) 05/19/2020 1057   HDL 35 (L) 05/19/2020 1057   CHOLHDL 4.1 05/19/2020 1057   LDLCALC 75 05/19/2020 1057   ASCVD risk enhancing conditions: age >15, DM, HTN, former smoker Unable to independently manage HLD Does not attend all scheduled provider appointments Does not contact provider office for questions/concerns RN Care  Manager Clinical Goal(s):  patient will work with RN Care Manager, providers, and care team towards execution of optimized self-health management plan patient will verbalize understanding of plan for effective management for HLD patient will work with RN CM and PCP to address needs related to HLD patient will attend all scheduled medical appointments: will follow up with PCP in October 2022 Interventions: Collaboration with Jon Billings, NP regarding development and update of comprehensive plan of care as evidenced by provider attestation and co-signature Inter-disciplinary care team collaboration (see longitudinal plan of care) Medication review performed; medication list updated in electronic medical record.  Inter-disciplinary care team collaboration (see longitudinal plan of care) Referred to pharmacy team for assistance with HLD medication management Evaluation of current treatment plan related to HLD and patient's adherence to plan as established by provider. 08-15-2020: The patient is doing well and denies any acute distress. The patient is eating a heart healthy/ADA diet. Says she is doing very well. Had COVID x 2 weeks ago but also doing well from that. Will continue to monitor.  Advised patient to call the office with any questions or concerns Provided education to patient re: heart healthy diet, adequate rest and medication compliance Reviewed scheduled/upcoming provider appointments including: follow up with PCP in October 2022 Patient Goals/Self-Care Activities: - keep a list of all the medicines I take; vitamins and herbals too - drink 6 to 8 glasses of water each day - eat 3 to 5 servings of fruits and vegetables each day - manage portion size - be open to making changes - learn my personal risk factors - choices provided - decision-making supported - reassurance provided   Follow Up Plan: Telephone follow up appointment with care management team member scheduled  for:10/06/20 at 3:45pm      Task: RM CM Mutually Develop and [place] of Patient Goals Completed 08/15/2020  Outcome: Positive  Note:   Care Management Activities:    - choices provided - decision-making supported - reassurance provided        Plan:Telephone follow up appointment with care management team member scheduled for:  10-06-2020 at 345 pm  [place], MSN, [facility] Mobile: [phone]

## 2020-08-24 ENCOUNTER — Other Ambulatory Visit: Payer: Self-pay | Admitting: Nurse Practitioner

## 2020-09-27 ENCOUNTER — Ambulatory Visit (INDEPENDENT_AMBULATORY_CARE_PROVIDER_SITE_OTHER): Payer: Medicare Other | Admitting: Nurse Practitioner

## 2020-09-27 ENCOUNTER — Encounter: Payer: Self-pay | Admitting: Nurse Practitioner

## 2020-09-27 ENCOUNTER — Other Ambulatory Visit: Payer: Self-pay

## 2020-09-27 VITALS — BP 127/80 | HR 92 | Temp 97.8°F | Ht 67.99 in | Wt 171.0 lb

## 2020-09-27 DIAGNOSIS — E1159 Type 2 diabetes mellitus with other circulatory complications: Secondary | ICD-10-CM | POA: Diagnosis not present

## 2020-09-27 DIAGNOSIS — R8290 Unspecified abnormal findings in urine: Secondary | ICD-10-CM

## 2020-09-27 DIAGNOSIS — I700 Atherosclerosis of aorta: Secondary | ICD-10-CM | POA: Diagnosis not present

## 2020-09-27 DIAGNOSIS — F419 Anxiety disorder, unspecified: Secondary | ICD-10-CM | POA: Diagnosis not present

## 2020-09-27 DIAGNOSIS — I152 Hypertension secondary to endocrine disorders: Secondary | ICD-10-CM | POA: Diagnosis not present

## 2020-09-27 DIAGNOSIS — E785 Hyperlipidemia, unspecified: Secondary | ICD-10-CM | POA: Diagnosis not present

## 2020-09-27 DIAGNOSIS — E1169 Type 2 diabetes mellitus with other specified complication: Secondary | ICD-10-CM | POA: Diagnosis not present

## 2020-09-27 DIAGNOSIS — R350 Frequency of micturition: Secondary | ICD-10-CM | POA: Diagnosis not present

## 2020-09-27 MED ORDER — NITROFURANTOIN MONOHYD MACRO 100 MG PO CAPS: 100.0000 mg | ORAL_CAPSULE | Freq: Two times a day (BID) | ORAL | 0 refills | Status: DC

## 2020-09-27 MED ORDER — METFORMIN HCL 500 MG PO TABS: 500.0000 mg | ORAL_TABLET | Freq: Two times a day (BID) | ORAL | 1 refills | Status: DC

## 2020-09-27 NOTE — Progress Notes (Signed)
BP 127/80   Pulse 92   Temp 97.8 F (36.6 C) (Oral)   Ht 5' 7.99" (1.727 m)   Wt 171 lb (77.6 kg)   SpO2 96%   BMI 26.01 kg/m    Subjective:    Patient ID: Marcia [name], female    DOB: 1951/01/15, 70 y.o.   MRN: [id number]  HPI: Marcia [name] is a 70 y.o. female  Chief Complaint  Patient presents with   Diabetes   Hyperlipidemia    Patient has not eaten today.   Urinary Frequency    For past 4 days,pain in left side.   HYPERTENSION / HYPERLIPIDEMIA Satisfied with current treatment? yes Duration of hypertension: years BP monitoring frequency: daily BP range: doesn't remember BP medication side effects: no Past BP meds: clonidine and HCTZ Duration of hyperlipidemia: years Cholesterol medication side effects: no Cholesterol supplements: none Past cholesterol medications: pravastatin (pravachol) Medication compliance: excellent compliance Aspirin: no Recent stressors: no Recurrent headaches: no Visual changes: no Palpitations: no Dyspnea: no Chest pain: no Lower extremity edema: no Dizzy/lightheaded: no  DIABETES Hypoglycemic episodes:no Polydipsia/polyuria: no Visual disturbance: no Chest pain: no Paresthesias: no Glucose Monitoring: yes  Accucheck frequency: Daily  Fasting glucose:130  Post prandial:  Evening:  Before meals: Taking Insulin?: no  Long acting insulin:  Short acting insulin: Blood Pressure Monitoring: daily Retinal Examination: Up to Date Foot Exam: Up to Date Diabetic Education: Not Completed Pneumovax: Up to Date Influenza: Not up to Date Aspirin: no  ANXIETY Patient states the Zoloft is working well for her anxiety.  Denies concerns at visit today.  Denies SI.   URINARY SYMPTOMS Dysuria: no Urinary frequency: yes Urgency: yes Small volume voids: yes Symptom severity: yes Urinary incontinence: no Foul odor: no Hematuria: no Abdominal pain: yes Back pain: no Suprapubic pain/pressure: yes Flank pain: no Fever:   yes, no, subjective, and low grade Vomiting: no Relief with cranberry juice: no Relief with pyridium: no stable  Relevant past medical, surgical, family and social history reviewed and updated as indicated. Interim medical history since our last visit reviewed. Allergies and medications reviewed and updated.  Review of Systems  Eyes:  Negative for visual disturbance.  Respiratory:  Negative for cough, chest tightness and shortness of breath.   Cardiovascular:  Negative for chest pain, palpitations and leg swelling.  Endocrine: Negative for polydipsia and polyuria.  Genitourinary:  Positive for frequency and urgency.       Pelvic pressure  Neurological:  Negative for dizziness, numbness and headaches.  Psychiatric/Behavioral:  Negative for suicidal ideas. The patient is not nervous/anxious.    Per HPI unless specifically indicated above     Objective:    BP 127/80   Pulse 92   Temp 97.8 F (36.6 C) (Oral)   Ht 5' 7.99" (1.727 m)   Wt 171 lb (77.6 kg)   SpO2 96%   BMI 26.01 kg/m   Wt Readings from Last 3 Encounters:  09/27/20 171 lb (77.6 kg)  05/19/20 176 lb 8 oz (80.1 kg)  05/12/20 159 lb (72.1 kg)    Physical Exam Vitals and nursing note reviewed.  Constitutional:      General: She is not in acute distress.    Appearance: Normal appearance. She is normal weight. She is not ill-appearing, toxic-appearing or diaphoretic.  HENT:     Head: Normocephalic.     Right Ear: External ear normal.     Left Ear: External ear normal.     Nose: Nose normal.  Mouth/Throat:     Mouth: Mucous membranes are moist.     Pharynx: Oropharynx is clear.  Eyes:     General:        Right eye: No discharge.        Left eye: No discharge.     Extraocular Movements: Extraocular movements intact.     Conjunctiva/sclera: Conjunctivae normal.     Pupils: Pupils are equal, round, and reactive to light.  Cardiovascular:     Rate and Rhythm: Normal rate and regular rhythm.     Heart  sounds: No murmur heard. Pulmonary:     Effort: Pulmonary effort is normal. No respiratory distress.     Breath sounds: Normal breath sounds. No wheezing or rales.  Abdominal:     General: Abdomen is flat. Bowel sounds are normal. There is no distension.     Palpations: Abdomen is soft.     Tenderness: There is no right CVA tenderness, left CVA tenderness or guarding.  Musculoskeletal:     Cervical back: Normal range of motion and neck supple.  Skin:    General: Skin is warm and dry.     Capillary Refill: Capillary refill takes less than 2 seconds.  Neurological:     General: No focal deficit present.     Mental Status: She is alert and oriented to person, place, and time. Mental status is at baseline.  Psychiatric:        Mood and Affect: Mood normal.        Behavior: Behavior normal.        Thought Content: Thought content normal.        Judgment: Judgment normal.    Results for orders placed or performed in visit on 09/27/20  Comp Met (CMET)  Result Value Ref Range   Glucose 96 65 - 99 mg/dL   BUN 16 8 - 27 mg/dL   Creatinine, Ser 0.70 0.57 - 1.00 mg/dL   eGFR 94 >59 mL/min/1.73   BUN/Creatinine Ratio 23 12 - 28   Sodium 139 134 - 144 mmol/L   Potassium 3.5 3.5 - 5.2 mmol/L   Chloride 96 96 - 106 mmol/L   CO2 29 20 - 29 mmol/L   Calcium 9.2 8.7 - 10.3 mg/dL   Total Protein 6.9 6.0 - 8.5 g/dL   Albumin 4.5 3.8 - 4.8 g/dL   Globulin, Total 2.4 1.5 - 4.5 g/dL   Albumin/Globulin Ratio 1.9 1.2 - 2.2   Bilirubin Total 0.6 0.0 - 1.2 mg/dL   Alkaline Phosphatase 66 44 - 121 IU/L   AST 30 0 - 40 IU/L   ALT 32 0 - 32 IU/L  Lipid Profile  Result Value Ref Range   Cholesterol, Total 181 100 - 199 mg/dL   Triglycerides 280 (H) 0 - 149 mg/dL   HDL 38 (L) >39 mg/dL   VLDL Cholesterol Cal 47 (H) 5 - 40 mg/dL   LDL Chol Calc (NIH) 96 0 - 99 mg/dL   Chol/HDL Ratio 4.8 (H) 0.0 - 4.4 ratio  HgB A1c  Result Value Ref Range   Hgb A1c MFr Bld 6.3 (H) 4.8 - 5.6 %   Est. average  glucose Bld gHb Est-mCnc 134 mg/dL      Assessment & Plan:   Problem List Items Addressed This Visit       Cardiovascular and Mediastinum   Hypertension associated with diabetes (HCC)    Chronic.  Controlled.  Continue with current medication regimen of Clonidine and HCTZ.  Labs  ordered today.  Return to clinic in 6 months for reevaluation.  Call sooner if concerns arise.        Relevant Medications   metFORMIN (GLUCOPHAGE) 500 MG tablet   Other Relevant Orders   Comp Met (CMET) (Completed)   Aortic atherosclerosis (HCC)    Chronic.  Controlled.  Continue with current medication regimen on Pravastatin 52m.  Labs ordered today.  Refills sent.  Return to clinic in 6 months for reevaluation.  Call sooner if concerns arise.        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  Controlled.  Continue with current medication regimen of Metformin 5048mBID.  Labs ordered today. Will make further recommendations based on lab results.  Return to clinic in 6 months for reevaluation.  Call sooner if concerns arise.        Relevant Medications   metFORMIN (GLUCOPHAGE) 500 MG tablet   Other Relevant Orders   HgB A1c (Completed)     Endocrine   Hyperlipidemia associated with type 2 diabetes mellitus (HCC)    Chronic.  Controlled.  Continue with current medication regimen of Pravastatin 4051m Labs ordered today.  Return to clinic in 6 months for reevaluation.  Call sooner if concerns arise.        Relevant Medications   metFORMIN (GLUCOPHAGE) 500 MG tablet   Other Relevant Orders   Lipid Profile (Completed)     Other   Anxiety    Chronic.  Controlled.  Continue with current medication regimen of Zoloft 47m32mLabs ordered today.  Return to clinic in 6 months for reevaluation.  Call sooner if concerns arise.        Other Visit Diagnoses     Urinary frequency       Patient unable to leave  urine sample. Will bring back tomorrow. given Macrobid to start after collecting urine sample. Will make recommendations based on labs.   Relevant Orders   Urinalysis, Routine w reflex microscopic        Follow up plan: Return in about 6 months (around 03/27/2021) for Physical and Fasting labs.

## 2020-09-28 ENCOUNTER — Other Ambulatory Visit: Payer: Medicare Other

## 2020-09-28 DIAGNOSIS — R8290 Unspecified abnormal findings in urine: Secondary | ICD-10-CM

## 2020-09-28 LAB — HEMOGLOBIN A1C
Est. average glucose Bld gHb Est-mCnc: 134 mg/dL
Hgb A1c MFr Bld: 6.3 % — ABNORMAL HIGH (ref 4.8–5.6)

## 2020-09-28 LAB — COMPREHENSIVE METABOLIC PANEL
ALT: 32 IU/L (ref 0–32)
AST: 30 IU/L (ref 0–40)
Albumin/Globulin Ratio: 1.9 (ref 1.2–2.2)
Albumin: 4.5 g/dL (ref 3.8–4.8)
Alkaline Phosphatase: 66 IU/L (ref 44–121)
BUN/Creatinine Ratio: 23 (ref 12–28)
BUN: 16 mg/dL (ref 8–27)
Bilirubin Total: 0.6 mg/dL (ref 0.0–1.2)
CO2: 29 mmol/L (ref 20–29)
Calcium: 9.2 mg/dL (ref 8.7–10.3)
Chloride: 96 mmol/L (ref 96–106)
Creatinine, Ser: 0.7 mg/dL (ref 0.57–1.00)
Globulin, Total: 2.4 g/dL (ref 1.5–4.5)
Glucose: 96 mg/dL (ref 65–99)
Potassium: 3.5 mmol/L (ref 3.5–5.2)
Sodium: 139 mmol/L (ref 134–144)
Total Protein: 6.9 g/dL (ref 6.0–8.5)
eGFR: 94 mL/min/{1.73_m2} (ref >59)

## 2020-09-28 LAB — URINALYSIS, ROUTINE W REFLEX MICROSCOPIC
Bilirubin, UA: NEGATIVE
Glucose, UA: NEGATIVE
Leukocytes,UA: NEGATIVE
Nitrite, UA: NEGATIVE
Protein,UA: NEGATIVE
RBC, UA: NEGATIVE
Specific Gravity, UA: 1.025 (ref 1.005–1.030)
Urobilinogen, Ur: 0.2 mg/dL (ref 0.2–1.0)
pH, UA: 5 (ref 5.0–7.5)

## 2020-09-28 LAB — LIPID PANEL
Chol/HDL Ratio: 4.8 ratio — ABNORMAL HIGH (ref 0.0–4.4)
Cholesterol, Total: 181 mg/dL (ref 100–199)
HDL: 38 mg/dL — ABNORMAL LOW (ref >39)
LDL Chol Calc (NIH): 96 mg/dL (ref 0–99)
Triglycerides: 280 mg/dL — ABNORMAL HIGH (ref 0–149)
VLDL Cholesterol Cal: 47 mg/dL — ABNORMAL HIGH (ref 5–40)

## 2020-09-28 NOTE — Addendum Note (Signed)
Addended by: Larae Grooms on: 09/28/2020 12:10 PM   Modules accepted: Orders

## 2020-09-28 NOTE — Assessment & Plan Note (Signed)
Chronic.  Controlled.  Continue with current medication regimen of Metformin 500mg  BID.  Labs ordered today. Will make further recommendations based on lab results.  Return to clinic in 6 months for reevaluation.  Call sooner if concerns arise.

## 2020-09-28 NOTE — Progress Notes (Signed)
Please let Marcia [name] know that her lab work looks good.  A1c remains stable at 6.3.  No changes to her medication regimen at this time.  Follow up as discussed.

## 2020-09-28 NOTE — Assessment & Plan Note (Signed)
Chronic.  Controlled.  Continue with current medication regimen of Pravastatin 40mg.  Labs ordered today.  Return to clinic in 6 months for reevaluation.  Call sooner if concerns arise.   

## 2020-09-28 NOTE — Progress Notes (Signed)
Please let patient know that her urine sample did not show any bacteria.  I have sent it for culture to make sure nothing grows.  If she is feeling better with the antibiotic she can complete the course.  I will let me know the results of the urine culture.

## 2020-09-28 NOTE — Assessment & Plan Note (Signed)
Chronic.  Controlled.  Continue with current medication regimen on Pravastatin 40mg.  Labs ordered today.  Refills sent.  Return to clinic in 6 months for reevaluation.  Call sooner if concerns arise.   

## 2020-09-28 NOTE — Assessment & Plan Note (Signed)
Chronic.  Controlled.  Continue with current medication regimen of Clonidine and HCTZ.  Labs ordered today.  Return to clinic in 6 months for reevaluation.  Call sooner if concerns arise.

## 2020-09-28 NOTE — Assessment & Plan Note (Signed)
Chronic.  Controlled.  Continue with current medication regimen of Zoloft 50mg.  Labs ordered today.  Return to clinic in 6 months for reevaluation.  Call sooner if concerns arise.   

## 2020-10-01 LAB — URINE CULTURE

## 2020-10-02 NOTE — Progress Notes (Signed)
Please let patient know that her urine did not grow any bacteria. If her symptoms persist we can discuss other options to help with this.

## 2020-10-03 ENCOUNTER — Other Ambulatory Visit: Payer: Self-pay | Admitting: Nurse Practitioner

## 2020-10-03 NOTE — Telephone Encounter (Signed)
Requested medication (s) are due for refill today:   Not on list  Requested medication (s) are on the active medication list:   Not on list  Future visit scheduled:   In 7 months doesn't say with who   Last ordered: Not on list   Requested Prescriptions  Pending Prescriptions Disp Refills   Blood Glucose Monitoring Suppl (ONE TOUCH ULTRA 2) w/Device KIT [Pharmacy Med Name: Rosalee Kaufman GLUCOSE SYST] 1 kit 0    Sig: AS DIRECTED.     Endocrinology: Diabetes - Testing Supplies Passed - 10/03/2020  8:37 AM      Passed - Valid encounter within last 12 months    Recent Outpatient Visits           6 days ago 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([place])   [facility] Jon Billings, NP   4 months ago Aortic atherosclerosis [facility])   [facility] Jon Billings, NP   6 months ago [place] Jon Billings, NP   8 months ago [facility], Henrine Screws T, NP   9 months ago Annual physical exam   [facility] Eulogio Bear, NP       Future Appointments             In 7 months [facility], [facility]

## 2020-10-05 ENCOUNTER — Ambulatory Visit (INDEPENDENT_AMBULATORY_CARE_PROVIDER_SITE_OTHER): Payer: Medicare Other

## 2020-10-05 ENCOUNTER — Telehealth: Payer: Self-pay

## 2020-10-05 DIAGNOSIS — E785 Hyperlipidemia, unspecified: Secondary | ICD-10-CM

## 2020-10-05 DIAGNOSIS — M79604 Pain in right leg: Secondary | ICD-10-CM

## 2020-10-05 DIAGNOSIS — E1159 Type 2 diabetes mellitus with other circulatory complications: Secondary | ICD-10-CM

## 2020-10-05 DIAGNOSIS — E1169 Type 2 diabetes mellitus with other specified complication: Secondary | ICD-10-CM

## 2020-10-05 DIAGNOSIS — I152 Hypertension secondary to endocrine disorders: Secondary | ICD-10-CM

## 2020-10-05 DIAGNOSIS — M545 Low back pain, unspecified: Secondary | ICD-10-CM

## 2020-10-05 NOTE — Chronic Care Management (AMB) (Signed)
Chronic Care Management   CCM RN Visit Note  10/05/2020 Name: Marcia [name] MRN: [id number] DOB: 1950-12-15  Subjective: Marcia [name] is a 70 y.o. year old female who is a primary care patient of Jon Billings, NP. The care management team was consulted for assistance with disease management and care coordination needs.    Engaged with patient by telephone for follow up visit in response to provider referral for case management and/or care coordination services.   Consent to Services:  The patient was given information about Chronic Care Management services, agreed to services, and gave verbal consent prior to initiation of services.  Please see initial visit note for detailed documentation.   Patient agreed to services and verbal consent obtained.   Assessment: Review of patient past medical history, allergies, medications, health status, including review of consultants reports, laboratory and other test data, was performed as part of comprehensive evaluation and provision of chronic care management services.   SDOH (Social Determinants of Health) assessments and interventions performed:  SDOH Interventions    Flowsheet Row Most Recent Value  SDOH Interventions   Food Insecurity Interventions Intervention Not Indicated  Financial Strain Interventions Intervention Not Indicated  Intimate Partner Violence Interventions Intervention Not Indicated  Physical Activity Interventions Other (Comments)  [uses her exercise bike]  Stress Interventions Intervention Not Indicated  Social Connections Interventions Other (Comment)  [went to her class reunion in August and had a great time]  Transportation Interventions Intervention Not Indicated        CCM Care Plan  No Known Allergies  Outpatient Encounter Medications as of 10/05/2020  Medication Sig   Blood Glucose Monitoring Suppl (ONE TOUCH ULTRA 2) w/Device KIT AS DIRECTED.   cloNIDine (CATAPRES) 0.1 MG tablet Take 1 tablet (0.1  mg total) by mouth daily.   fluticasone (FLONASE) 50 MCG/ACT nasal spray Place 2 sprays into both nostrils daily.   gabapentin (NEURONTIN) 300 MG capsule Take 1 capsule (300 mg total) by mouth 2 (two) times daily.   hydrochlorothiazide (HYDRODIURIL) 25 MG tablet Take 1 tablet (25 mg total) by mouth daily.   hydrOXYzine (VISTARIL) 25 MG capsule Take 1 capsule (25 mg total) by mouth 3 (three) times daily as needed for anxiety.   metFORMIN (GLUCOPHAGE) 500 MG tablet Take 1 tablet (500 mg total) by mouth 2 (two) times daily with a meal.   nitrofurantoin, macrocrystal-monohydrate, (MACROBID) 100 MG capsule Take 1 capsule (100 mg total) by mouth 2 (two) times daily.   pravastatin (PRAVACHOL) 40 MG tablet Take 1 tablet (40 mg total) by mouth daily.   sertraline (ZOLOFT) 50 MG tablet Take 1 tablet (50 mg total) by mouth daily.   Suvorexant (BELSOMRA) 10 MG TABS Take 10 mg by mouth at bedtime.   tiZANidine (ZANAFLEX) 4 MG tablet Take 1 tablet (4 mg total) by mouth at bedtime as needed for muscle spasms.   No facility-administered encounter medications on file as of 10/05/2020.    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    Conditions to be addressed/monitored:HTN, HLD, DMII, and Chronic Pain  Care Plan : RN CM  Diabetes Type 2 (Adult)  Updates made by Vanita Ingles, RN since 10/05/2020 12:00 AM     Problem: RN CM Glycemic Management (Diabetes, Type 2)   Priority: Medium     Long-Range Goal: RNCM: Management of DM   Start Date: 06/02/2020  Expected End Date: 04/12/2021   This Visit's Progress: On track  Recent Progress: On track  Priority: Medium  Note:   Objective:  Lab Results  Component Value Date   HGBA1C 6.3 (H) 09/27/2020    Lab Results  Component Value Date   CREATININE 0.70 09/27/2020   CREATININE 0.71 05/19/2020   CREATININE 0.84 12/10/2019   Lab Results  Component Value Date   EGFR 94 09/27/2020   Current Barriers:  Knowledge Deficits related to medications used for management of diabetes Currently taking prednisone pack 06/02/20- completed and blood sugars are WNL Does not attend all scheduled provider appointments Does not maintain contact with provider office Case Manager Clinical Goal(s):  patient will demonstrate improved adherence to prescribed treatment plan for diabetes self care/management as evidenced by: daily monitoring and recording of CBG  adherence to ADA/ carb modified diet adherence to prescribed medication regimen Interventions:  Collaboration with Jon Billings, NP regarding development and update of comprehensive plan of care as evidenced by provider attestation and co-signature Inter-disciplinary care team collaboration (see longitudinal plan of care) Provided education to patient about basic DM disease process Reviewed medications with patient and discussed importance of medication adherence. 10-05-2020 The patient states compliance with medications. Denies any medications concerns. Discussed plans with patient for ongoing care management follow up and provided patient with direct contact information for care management team Reviewed scheduled/upcoming provider appointments including: Saw pcp 09-27-2020 and will follow up with the pcp in March of 2023 Advised patient, providing education and rationale, to check cbg daily and record, calling PCP for findings outside established parameters.  08-15-2020: The patient is doing well and denies any new issues with her DM management. Readings are 128 to 152. She did have  COVID a couple of weeks ago but it was only sinus drainage. She went to the walk in clinic and was tested. She denies any issues post COVID or DM at this time. 10-05-2020: The patient states she is doing well and blood sugars are stable. Most recent A1C was 6.3 on 09-27-2020 Review of patient status, including review of consultants reports, relevant laboratory and other test results, and medications completed. Self-Care Activities - Self administers oral medications as prescribed Attends all scheduled provider appointments Checks blood sugars as prescribed and utilize hyper and hypoglycemia protocol as needed Adheres to prescribed ADA/carb modified Patient Goals: - check blood sugar at prescribed times - check blood sugar if I feel it is too high or too low - enter blood sugar readings and medication or insulin into daily log - take the blood sugar log to all doctor visits - drink 6 to 8 glasses of water each day - manage portion size - keep appointment with eye doctor.  Saw eye doctor on 05/26/20 with follow up in October 2022 - check feet daily for cuts, sores or redness - wear comfortable, well-fitting shoes   - barriers to adherence to treatment plan identified - blood glucose monitoring encouraged - blood glucose readings reviewed - use of blood glucose monitoring log promoted  Follow Up Plan: Telephone follow up appointment with care management team member scheduled for: 12-01-20 at 3:45pm      Care Plan : RN CM Chronic Pain (Adult)  Updates made by Vanita Ingles, RN since 10/05/2020 12:00 AM     Problem: RN CM Pain Management Plan (Chronic Pain)   Priority: High     Long-Range Goal: RN CM Pain Management Plan Developed   Start Date: 06/02/2020  Expected End Date: 04/12/2021  This Visit's Progress: On track  Recent Progress: On track  Priority: High  Note:   Current Barriers:  Knowledge Deficits related to managing acute/chronic pain Non-adherence to scheduled provider  appointments Non-adherence to prescribed medication regimen Difficulty obtaining medications Chronic Disease Management support and education needs related to chronic pain Unable to independently to manage back pain and leg pain Does not attend all scheduled provider appointments Does not maintain contact with provider office Nurse Case Manager Clinical Goal(s):  patient will verbalize understanding of plan for managing pain patient will attend all scheduled medical appointments: Saw pcp on 09-27-2020 and will see again in March of 2023 patient will demonstrate use of different relaxation  skills and/or diversional activities to assist with pain reduction (distraction, imagery, relaxation, massage, acupressure, TENS, heat, and cold application patient will report pain at a level less than 3 to 4 on a 10-10 rating scale patient will use pharmacological and nonpharmacological pain relief strategies patient will verbalize acceptable level of pain relief and ability to engage in desired activities patient will engage in desired activities without an increase in pain level Interventions:  Collaboration with Jon Billings, NP regarding development and update of comprehensive plan of care as evidenced by provider attestation and co-signature Inter-disciplinary care team collaboration (see longitudinal plan of care) - effectiveness of pharmacologic therapy monitored. 10-05-2020: The patient ask if the pcp could prescribe a steroid for her to take like before. States she forgot to ask since she had a UTI and was dealing with that. Will collaborate with the pcp - medication-induced side effects managed - misuse of pain medication assessed - motivation and barriers to change assessed and addressed - pain assessed- 10-05-2020: The patient states she still has pain and discomfort but she knows it is from arthritis. She is using OTC products for pain relief.  - pain treatment goals reviewed Evaluation of  current treatment plan related to pain in back and bilateral legs and patient's adherence to plan as established by provider. 08-15-2020: The patient states she has her pain under control at this time. She denies any new concerns with pain. Will likely close goal if she is stable at next outreach. 10-05-2020: The patient states she is still having pain and she is using OTC products. Education about trying heat application. She has not tried this but will try this and see if it will be helpful. The patient wanted to know if the pcp could give her a script like before when she took the prednisone. Will collaborate with the pcp and ask for recommendations.  Advised patient to call the office with any changes in intensity of pain Provided education to patient IB:[id number] therapies, adequate rest and working with PT.  Educated on falls prevention and safety Reviewed medications with patient and discussed compliance.  Is starting prednisone dose pack and muscle relaxer. 08-15-2020: Has completed dose pack and has no further issues. Will continue to monitor. 10-05-2020: Inquired about steroid dose pack. Felt it was helpful before. Will collaborate with the pcp.  Discussed plans with patient for ongoing care management follow up and provided patient with direct contact information for care management team Allow patient to maintain a diary of pain ratings, timing, precipitating  events, medications, treatments, and what works best to relieve pain,  Refer to support groups and self-help groups Educate patient about the use of pharmacological interventions for pain management- antianxiety, antidepressants, NSAIDS, opioid analgesics,  Explain the importance of lifestyle modifications to effective pain management  Patient Goals/Self Care Activities:  - pain assessed- 08-15-2020: Denies pain at this time.  - pain management plan developed - pain treatment goals reviewed - patient response to treatment assessed   Self-administers medications as prescribed Attends all scheduled provider appointments Calls pharmacy for medication refills Calls provider office for new concerns or questions Follow Up Plan: Telephone follow up appointment with care management team member scheduled for:12-01-20 at 3:45pm       Care Plan : RM CM Hypertension (Adult)  Updates made by Vanita Ingles, RN since 10/05/2020 12:00 AM     Problem: RN CM HTN   Priority: Medium     Long-Range Goal: RN CM Hypertension Monitored   Start Date: 06/02/2020  Expected End Date: 08/10/2021  This Visit's Progress: On track  Recent Progress: On track  Priority: Medium  Note:   Objective:  Last practice recorded BP readings:  BP Readings from Last 3 Encounters:  09/27/20 127/80  05/19/20 127/68  03/24/20 106/67     Most recent eGFR/CrCl:  Lab Results  Component Value Date   EGFR 94 09/27/2020    No components found for: CRCL Current Barriers:  Knowledge Deficits related to basic understanding of hypertension pathophysiology and self care management Knowledge Deficits related to understanding of medications prescribed for management of hypertension Does not attend all scheduled provider appointments Does not maintain contact with provider office Case Manager Clinical Goal(s):  patient will verbalize understanding of plan for hypertension management patient will attend all scheduled medical appointments: Saw pcp on 09-27-2020 and will follow up in March of 2023 patient will demonstrate improved adherence to prescribed treatment plan for hypertension as evidenced by taking all medications as prescribed, monitoring and recording blood pressure as directed, adhering to low sodium/DASH diet patient will demonstrate improved health management independence as evidenced by checking blood pressure as directed and notifying PCP if SBP>150 or DBP > 90, taking all medications as prescribe, and adhering to a low sodium diet as  discussed. patient will verbalize basic understanding of hypertension disease process and self health management plan as evidenced by heart healthy diet and working with CCM team to manage health and well being Interventions:  Collaboration with Jon Billings, NP regarding development and update of comprehensive plan of care as evidenced by provider attestation and co-signature Inter-disciplinary care team collaboration (see longitudinal plan of care) Evaluation of current treatment plan related to hypertension self management and patient's adherence to plan as established by provider. 10-05-2020: The patient states her blood pressures are stable. States readings have been in normal range. She denies any issues related to HTN. Feels great and talked about going to her high school reunion. The patient states she is eating well and needs to lose some weight but she is happy. Denies any new concerns. Does want to know if the pcp recommends her to get the 4th COVID booster vaccine. Will discuss with the pcp and let the patient know.  Provided education to patient re: stroke prevention, s/s of heart attack and stroke, DASH diet, complications of uncontrolled blood pressure Reviewed medications with patient and discussed importance of compliance. 10-05-2020: States compliance with medications.  Discussed plans with patient for ongoing care management follow up and provided patient with  direct contact information for care management team Advised patient, providing education and rationale, to monitor blood pressure daily and record, calling PCP for findings outside established parameters.  Reviewed scheduled/upcoming provider appointments including: saw pcp on 09-27-2020 will see again in March of 2023 Self-Care Activities: - Self administers medications as prescribed Attends all scheduled provider appointments Calls provider office for new concerns, questions, or BP outside discussed parameters Checks BP and  records as discussed Follows a low sodium diet/DASH diet Patient Goals: - check blood pressure weekly - choose a place to take my blood pressure (home, clinic or office, retail store) - write blood pressure results in a log or diary - agree to work together to make changes - ask questions to understand - learn about high blood pressure - choices provided - decision-making supported - reassurance provided   Follow Up Plan: Telephone follow up appointment with care management team member scheduled for: 12-01-20 at 3:45pm    Care Plan : RN CM HLD Manangement  Updates made by Vanita Ingles, RN since 10/05/2020 12:00 AM     Problem: RN CM Hyperlipidemia   Priority: Medium     Long-Range Goal: RN CM Hyperlipidemia Management   Start Date: 06/02/2020  Expected End Date: 06/11/2021  This Visit's Progress: On track  Recent Progress: On track  Priority: Medium  Note:   Current Barriers:  Poorly controlled hyperlipidemia, complicated by DM, HTN Current antihyperlipidemic regimen: on Pravachol 40 mg daily Most recent lipid panel:     Component Value Date/Time   CHOL 143 05/19/2020 1057   TRIG 194 (H) 05/19/2020 1057   HDL 35 (L) 05/19/2020 1057   CHOLHDL 4.1 05/19/2020 1057   [place] 75 05/19/2020 1057   ASCVD risk enhancing conditions: age >47, DM, HTN, former smoker Unable to independently manage HLD Does not attend all scheduled provider appointments Does not contact provider office for questions/concerns RN Care Manager Clinical Goal(s):  patient will work with RN Care Manager, providers, and care team towards execution of optimized self-health management plan patient will verbalize understanding of plan for effective management for HLD patient will work with RN CM and PCP to address needs related to HLD patient will attend all scheduled medical appointments: saw pcp on 09-27-2020., next appointment March of 2023 Interventions: Collaboration with Jon Billings, NP  regarding development and update of comprehensive plan of care as evidenced by provider attestation and co-signature Inter-disciplinary care team collaboration (see longitudinal plan of care) Medication review performed; medication list updated in electronic medical record.  Inter-disciplinary care team collaboration (see longitudinal plan of care) Referred to pharmacy team for assistance with HLD medication management Evaluation of current treatment plan related to HLD and patient's adherence to plan as established by provider. 08-15-2020: The patient is doing well and denies any acute distress. The patient is eating a heart healthy/ADA diet. Says she is doing very well. Had COVID x 2 weeks ago but also doing well from that. Will continue to monitor. 10-05-2020: The patient is doing well. Denies any new concerns. Was seen in the office on 09-27-2020 and treated for UTI. The patient states that she has taken all antibiotics and is doing well. Did want to inquire about the COVID booster. Will collaborate with the pcp for recommendations.  Advised patient to call the office with any questions or concerns Provided education to patient re: heart healthy diet, adequate rest and medication compliance Reviewed scheduled/upcoming provider appointments including: follow up with PCP in October 2022 Patient Goals/Self-Care Activities: -  keep a list of all the medicines I take; vitamins and herbals too - drink 6 to 8 glasses of water each day - eat 3 to 5 servings of fruits and vegetables each day - manage portion size - be open to making changes - learn my personal risk factors - choices provided - decision-making supported - reassurance provided   Follow Up Plan: Telephone follow up appointment with care management team member scheduled for:12-01-20 at 3:45pm       Plan:Telephone follow up appointment with care management team member scheduled for:  12-01-2020 at 345 pm  [place], MSN, [facility] Mobile: [phone]

## 2020-10-05 NOTE — Patient Instructions (Signed)
Visit Information  PATIENT GOALS:  Goals Addressed             This Visit's Progress    RN CM Monitor and Manage My Blood Sugar-Diabetes Type 2       Timeframe:  Long-Range Goal Priority:  High Start Date:   06/02/2020                          Expected End Date:  06/02/2021                     Follow Up Date 12/01/2020    - check blood sugar at prescribed times - check blood sugar if I feel it is too high or too low - take the blood sugar log to all doctor visits    Why is this important?   Checking your blood sugar at home helps to keep it from getting very high or very low.  Writing the results in a diary or log helps the doctor know how to care for you.  Your blood sugar log should have the time, date and the results.  Also, write down the amount of insulin or other medicine that you take.  Other information, like what you ate, exercise done and how you were feeling, will also be helpful.     Notes: Patient currently on prednisone dose pack and encouraged to monitor blood sugars closely. 08-15-2020: The patient is doing well and her blood sugars regulated. Readings are 128, 132, 152. Will continue to monitor. 10-05-2020: The patient was in the office to see pcp on 09/27/2020 A1C was 6.4%. Patient was happy that her A1C was below 7. Denies any issues with DM management at this time.         The patient verbalized understanding of instructions, educational materials, and care plan provided today and declined offer to receive copy of patient instructions, educational materials, and care plan.   Telephone follow up appointment with care management team member scheduled for: 12-01-2020 at 345 pm  Alto Denver RN, MSN, CCM Community Care Coordinator [place]  [facility] Mobile: [phone]

## 2020-10-05 NOTE — Telephone Encounter (Signed)
  Chronic Care Management   Note  10/05/2020 Name: Marcia [name] MRN: [id number] DOB: Nov 20, 1950  The patient called back and call completed. See new encounter.   Follow up plan: The care management team will reach out to the patient again over the next 30 to 60 days.   Alto Denver RN, MSN, CCM Community Care Coordinator [place]  [facility] Mobile: [phone]

## 2020-10-06 ENCOUNTER — Telehealth: Payer: Self-pay

## 2020-10-11 ENCOUNTER — Telehealth: Payer: Self-pay | Admitting: Nurse Practitioner

## 2020-10-11 NOTE — Telephone Encounter (Signed)
Medication Refill - Medication: Gabapentin  Has the patient contacted their pharmacy? Yes.   PT states that this was last refilled on 07/27/20 and that she was advised by pharmacy that she could not receive a refill before 10/27/20. She states that she is completely out of medication. Please advise.  (Agent: If no, request that the patient contact the pharmacy for the refill.) (Agent: If yes, when and what did the pharmacy advise?)  Preferred Pharmacy (with phone number or street name):  [facility] - [place], [place] - [address]  [address] [place] [address]  Phone: [phone] Fax: [phone]  Hours: Not open 24 hours   Has the patient been seen for an appointment in the last year OR does the patient have an upcoming appointment? Yes.    Agent: Please be advised that RX refills may take up to 3 business days. We ask that you follow-up with your pharmacy.

## 2020-10-12 NOTE — Telephone Encounter (Signed)
Spoke with pt, she is taking two pills a day. One in the morning and one at night.

## 2020-10-12 NOTE — Telephone Encounter (Signed)
Can you find out how patient is taking the medication?  It is only written for her to take twice daily.

## 2020-10-12 NOTE — Telephone Encounter (Signed)
Left message for patient to give our office a call back at her earliest convenience to clarification on how she is currently taking her prescription for Gabapentin.

## 2020-10-13 NOTE — Telephone Encounter (Signed)
Called and discussed with pharmacy. Last RX was picked up 08/01/20 for a 90 day supply. Pharmacy states that when they talked with the patient, the patient may have been under the impression that she was supposed to have a new RX.   Will call and discuss this further with the patient.

## 2020-10-13 NOTE — Telephone Encounter (Signed)
Pt is calling to check on the status of the medication refill. 

## 2020-10-13 NOTE — Telephone Encounter (Signed)
Called and spoke to patient. Discussed with her about the amount she was dispensed. Patient states she will just wait until around 11/01/20 to get her prescription filled.

## 2020-10-20 DIAGNOSIS — I152 Hypertension secondary to endocrine disorders: Secondary | ICD-10-CM | POA: Diagnosis not present

## 2020-10-20 DIAGNOSIS — E1159 Type 2 diabetes mellitus with other circulatory complications: Secondary | ICD-10-CM

## 2020-10-20 DIAGNOSIS — E1169 Type 2 diabetes mellitus with other specified complication: Secondary | ICD-10-CM | POA: Diagnosis not present

## 2020-10-20 DIAGNOSIS — E785 Hyperlipidemia, unspecified: Secondary | ICD-10-CM

## 2020-11-21 ENCOUNTER — Ambulatory Visit: Payer: Self-pay

## 2020-11-21 NOTE — Telephone Encounter (Signed)
Pt called to report her pain has worsened and is waking up at night. Pt changes position and turns on her other side but approximately 30 minutes later she has to turn onto the other side.  Pt c/o chronic bilateral upper thigh pain. Care advice given and pt verbalized understanding.  Pt is wanting her PCP to call a medication to help with the pain. Please call pt with PCP recommendation.        Reason for Disposition  [1] MODERATE pain (e.g., interferes with normal activities, limping) AND [2] present > 3 days  Answer Assessment - Initial Assessment Questions 1. ONSET: "When did the pain start?"      year 2. LOCATION: "Where is the pain located?"      Both of upper thighs 3. PAIN: "How bad is the pain?"    (Scale 1-10; or mild, moderate, severe)   -  MILD (1-3): doesn't interfere with normal activities    -  MODERATE (4-7): interferes with normal activities (e.g., work or school) or awakens from sleep, limping    -  SEVERE (8-10): excruciating pain, unable to do any normal activities, unable to walk     Moderate wakes up from sleep 4. WORK OR EXERCISE: "Has there been any recent work or exercise that involved this part of the body?"      No  5. CAUSE: "What do you think is causing the leg pain?"     arthritis 6. OTHER SYMPTOMS: "Do you have any other symptoms?" (e.g., chest pain, back pain, breathing difficulty, swelling, rash, fever, numbness, weakness)     no 7. PREGNANCY: "Is there any chance you are pregnant?" "When was your last menstrual period?"     N/a  Protocols used: Leg Pain-A-AH

## 2020-11-21 NOTE — Telephone Encounter (Signed)
Please see below and advise as needed. 

## 2020-11-21 NOTE — Telephone Encounter (Signed)
Last time this was discussed with the patient I placed a referral because she had injections in the past which helped with the pain.  I recommend she make an appointment with Ortho.

## 2020-11-21 NOTE — Telephone Encounter (Signed)
Routing to PCP to advise on medication. Would the patient need an appointment. Looks like sciatic nerve pain has been discussed in the past.

## 2020-11-22 ENCOUNTER — Telehealth: Payer: Self-pay | Admitting: Nurse Practitioner

## 2020-11-22 NOTE — Telephone Encounter (Signed)
Patient called in I gave her the message left in they system, that refferal was still be decided on whether Dr Caren Griffins is to call for her referral appt for ortho or should the patient call. Please call back

## 2020-11-23 ENCOUNTER — Other Ambulatory Visit: Payer: Self-pay | Admitting: Nurse Practitioner

## 2020-11-23 NOTE — Telephone Encounter (Signed)
Left message for patient notifying her that per referral coordinator Brayton Caves. Patient does not require a new referral and she should OK to reach out to [facility] to get scheduled for an appointment. Advised patient to give our office a call back if she has any questions or concerns.

## 2020-11-23 NOTE — Telephone Encounter (Signed)
Fyi.

## 2020-11-23 NOTE — Telephone Encounter (Signed)
Requested medication (s) are due for refill today:   Provider to review  Requested medication (s) are on the active medication list:   Yes  Future visit scheduled:   Yes   Last ordered: 05/19/2020 #30, 5 refills  Non delegated refill   Requested Prescriptions  Pending Prescriptions Disp Refills   BELSOMRA 10 MG TABS [Pharmacy Med Name: BELSOMRA 10 MG TABLET] 30 tablet 0    Sig: Take 1 TABLET (10 mg) by mouth at bedtime.     Off-Protocol Failed - 11/23/2020  8:42 AM      Failed - Medication not assigned to a protocol, review manually.      Passed - Valid encounter within last 12 months    Recent Outpatient Visits           1 month ago 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(HCC)   [facility] Larae Grooms, NP   6 months ago Aortic atherosclerosis [facility])   [facility] Larae Grooms, NP   8 months ago Rash   [facility] Larae Grooms, NP   10 months ago Anxiety   [facility] [place], Corrie Dandy T, NP   11 months ago Annual physical exam   [facility] Valentino Nose, NP       Future Appointments             In 5 months [facility], [facility]

## 2020-11-23 NOTE — Telephone Encounter (Signed)
Pt. Called back and given message to go back to [facility]. Verbalizes understanding.

## 2020-11-30 DIAGNOSIS — M7061 Trochanteric bursitis, right hip: Secondary | ICD-10-CM | POA: Diagnosis not present

## 2020-11-30 DIAGNOSIS — M7062 Trochanteric bursitis, left hip: Secondary | ICD-10-CM | POA: Diagnosis not present

## 2020-12-01 ENCOUNTER — Telehealth: Payer: Medicare Other | Admitting: General Practice

## 2020-12-01 ENCOUNTER — Ambulatory Visit (INDEPENDENT_AMBULATORY_CARE_PROVIDER_SITE_OTHER): Payer: Medicare Other

## 2020-12-01 DIAGNOSIS — E1159 Type 2 diabetes mellitus with other circulatory complications: Secondary | ICD-10-CM

## 2020-12-01 DIAGNOSIS — G8929 Other chronic pain: Secondary | ICD-10-CM

## 2020-12-01 DIAGNOSIS — E785 Hyperlipidemia, unspecified: Secondary | ICD-10-CM

## 2020-12-01 DIAGNOSIS — M79604 Pain in right leg: Secondary | ICD-10-CM

## 2020-12-01 DIAGNOSIS — E1169 Type 2 diabetes mellitus with other specified complication: Secondary | ICD-10-CM

## 2020-12-01 DIAGNOSIS — I152 Hypertension secondary to endocrine disorders: Secondary | ICD-10-CM

## 2020-12-01 DIAGNOSIS — M545 Low back pain, unspecified: Secondary | ICD-10-CM

## 2020-12-01 NOTE — Patient Instructions (Signed)
Visit Information  Patient will self administer medications as prescribed as evidenced by self report/primary caregiver report  Patient will attend all scheduled provider appointments as evidenced by clinician review of documented attendance to scheduled appointments and patient/caregiver report Patient will call pharmacy for medication refills as evidenced by patient report and review of pharmacy fill history as appropriate Patient will attend church or other social activities as evidenced by patient report Patient will continue to perform ADL's independently as evidenced by patient/caregiver report Patient will continue to perform IADL's independently as evidenced by patient/caregiver report Patient will call provider office for new concerns or questions as evidenced by review of documented incoming telephone call notes and patient report Patient will work with BSW to address care coordination needs and will continue to work with the clinical team to address health care and disease management related needs as evidenced by documented adherence to scheduled care management/care coordination appointments schedule appointment with eye doctor check blood sugar at prescribed times: before meals and at bedtime, when you have symptoms of low or high blood sugar, and before and after exercise check feet daily for cuts, sores or redness enter blood sugar readings and medication or insulin into daily log take the blood sugar log to all doctor visits take the blood sugar meter to all doctor visits trim toenails straight across eat fish at least once per week fill half of plate with vegetables limit fast food meals to no more than 1 per week manage portion size prepare main meal at home 3 to 5 days each week read food labels for fat, fiber, carbohydrates and portion size reduce red meat to 2 to 3 times a week keep feet up while sitting wash and dry feet carefully every day wear comfortable, cotton  socks wear comfortable, well-fitting shoes - check blood pressure daily - choose a place to take my blood pressure (home, clinic or office, retail store) - write blood pressure results in a log or diary - learn about high blood pressure - keep a blood pressure log - take blood pressure log to all doctor appointments - call doctor for signs and symptoms of high blood pressure - develop an action plan for high blood pressure - keep all doctor appointments - take medications for blood pressure exactly as prescribed - report new symptoms to your doctor - eat more whole grains, fruits and vegetables, lean meats and healthy fats - call for medicine refill 2 or 3 days before it runs out - take all medications exactly as prescribed - call doctor with any symptoms you believe are related to your medicine - call doctor when you experience any new symptoms - go to all doctor appointments as scheduled - adhere to prescribed diet: Heart healthy/ADA diet   The patient verbalized understanding of instructions, educational materials, and care plan provided today and declined offer to receive copy of patient instructions, educational materials, and care plan.   Telephone follow up appointment with care management team member scheduled for: 01-26-2021 at 345 pm  Alto Denver RN, MSN, CCM Community Care Coordinator [place]  [facility] Mobile: [phone]

## 2020-12-01 NOTE — Chronic Care Management (AMB) (Signed)
Chronic Care Management   CCM RN Visit Note  12/01/2020 Name: Marcia [name] MRN: [id number] DOB: [birth year]  Subjective: Marcia [name] is a 70 y.o. year old female who is a primary care patient of Jon Billings, NP. The care management team was consulted for assistance with disease management and care coordination needs.    Engaged with patient by telephone for follow up visit in response to provider referral for case management and/or care coordination services.   Consent to Services:  The patient was given information about Chronic Care Management services, agreed to services, and gave verbal consent prior to initiation of services.  Please see initial visit note for detailed documentation.   Patient agreed to services and verbal consent obtained.   Assessment: Review of patient past medical history, allergies, medications, health status, including review of consultants reports, laboratory and other test data, was performed as part of comprehensive evaluation and provision of chronic care management services.   SDOH (Social Determinants of Health) assessments and interventions performed:    CCM Care Plan  No Known Allergies  Outpatient Encounter Medications as of 12/01/2020  Medication Sig   Blood Glucose Monitoring Suppl (ONE TOUCH ULTRA 2) w/Device KIT AS DIRECTED.   BELSOMRA 10 MG TABS Take 1 TABLET (10 mg) by mouth at bedtime.   cloNIDine (CATAPRES) 0.1 MG tablet Take 1 tablet (0.1 mg total) by mouth daily.   fluticasone (FLONASE) 50 MCG/ACT nasal spray Place 2 sprays into both nostrils daily.   gabapentin (NEURONTIN) 300 MG capsule Take 1 capsule (300 mg total) by mouth 2 (two) times daily.   hydrochlorothiazide (HYDRODIURIL) 25 MG tablet Take 1 tablet (25 mg total) by mouth daily.   hydrOXYzine (VISTARIL) 25 MG capsule Take 1 capsule (25 mg total) by mouth 3 (three) times daily as needed for anxiety.   metFORMIN (GLUCOPHAGE) 500 MG tablet Take 1 tablet (500 mg total)  by mouth 2 (two) times daily with a meal.   nitrofurantoin, macrocrystal-monohydrate, (MACROBID) 100 MG capsule Take 1 capsule (100 mg total) by mouth 2 (two) times daily.   pravastatin (PRAVACHOL) 40 MG tablet Take 1 tablet (40 mg total) by mouth daily.   sertraline (ZOLOFT) 50 MG tablet Take 1 tablet (50 mg total) by mouth daily.   tiZANidine (ZANAFLEX) 4 MG tablet Take 1 tablet (4 mg total) by mouth at bedtime as needed for muscle spasms.   No facility-administered encounter medications on file as of 12/01/2020.    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    Conditions to be addressed/monitored:HTN, HLD, DMII, and Osteoarthritis  Care Plan : RN CM Diabetes Type 2 (Adult)  Updates made by Vanita Ingles, RN since 12/01/2020 12:00 AM  Completed 12/01/2020   Problem: RN CM Glycemic Management (Diabetes, Type 2) Resolved 12/01/2020  Priority: Medium     Long-Range Goal: RNCM: Management of DM Completed 12/01/2020  Start Date: 06/02/2020  Expected End Date: 04/12/2021  Recent Progress: On track  Priority: Medium  Note:   Objective: resolving, duplicate goal  Lab Results  Component Value Date   HGBA1C 6.3 (H)  09/27/2020    Lab Results  Component Value Date   CREATININE 0.70 09/27/2020   CREATININE 0.71 05/19/2020   CREATININE 0.84 12/10/2019   Lab Results  Component Value Date   EGFR 94 09/27/2020   Current Barriers:  Knowledge Deficits related to medications used for management of diabetes Currently taking prednisone pack 06/02/20- completed and  blood sugars are WNL Does not attend all scheduled provider appointments Does not maintain contact with provider office Case Manager Clinical Goal(s):  patient will demonstrate improved adherence to prescribed treatment plan for diabetes self care/management as evidenced by: daily monitoring and recording of CBG  adherence to ADA/ carb modified diet adherence to prescribed medication regimen Interventions:  Collaboration with Jon Billings, NP regarding development and update of comprehensive plan of care as evidenced by provider attestation and co-signature Inter-disciplinary care team collaboration (see longitudinal plan of care) Provided education to patient about basic DM disease process Reviewed medications with patient and discussed importance of medication adherence. 10-05-2020 The patient states compliance with medications. Denies any medications concerns. Discussed plans with patient for ongoing care management follow up and provided patient with direct contact information for care management team Reviewed scheduled/upcoming provider appointments including: Saw pcp 09-27-2020 and will follow up with the pcp in March of 2023 Advised patient, providing education and rationale, to check cbg daily and record, calling PCP for findings outside established parameters.  08-15-2020: The patient is doing well and denies any new issues with her DM management. Readings are 128 to 152. She did have COVID a couple of weeks ago but it was only sinus drainage. She went to the walk in clinic and was tested. She denies any issues post COVID or DM at this time. 10-05-2020: The patient states she is doing well and blood sugars are stable. Most recent A1C was 6.3 on 09-27-2020 Review of patient status, including review of consultants reports, relevant laboratory and other test results, and medications completed. Self-Care Activities - Self administers oral medications as prescribed Attends all scheduled provider  appointments Checks blood sugars as prescribed and utilize hyper and hypoglycemia protocol as needed Adheres to prescribed ADA/carb modified Patient Goals: - check blood sugar at prescribed times - check blood sugar if I feel it is too high or too low - enter blood sugar readings and medication or insulin into daily log - take the blood sugar log to all doctor visits - drink 6 to 8 glasses of water each day - manage portion size - keep appointment with eye doctor.  Saw eye doctor on 05/26/20 with follow up in October 2022 - check feet daily for cuts, sores or redness - wear comfortable, well-fitting shoes   - barriers to adherence to treatment plan identified - blood glucose monitoring encouraged - blood glucose readings reviewed - use of blood glucose monitoring log promoted  Follow Up Plan: Telephone follow up appointment with care management team member scheduled for: 12-01-20 at 3:45pm      Care Plan : RN CM Chronic Pain (Adult)  Updates made by Vanita Ingles, RN since 12/01/2020 12:00 AM  Completed 12/01/2020   Problem: RN CM Pain Management Plan (Chronic Pain) Resolved 12/01/2020  Priority: High     Long-Range Goal: RN CM Pain Management Plan Developed Completed 12/01/2020  Start Date: 06/02/2020  Expected End Date: 04/12/2021  Recent Progress: On track  Priority: High  Note:   Current Barriers: resolving, duplicate goals Knowledge Deficits related to managing acute/chronic pain Non-adherence to scheduled provider appointments Non-adherence  to prescribed medication regimen Difficulty obtaining medications Chronic Disease Management support and education needs related to chronic pain Unable to independently to manage back pain and leg pain Does not attend all scheduled provider appointments Does not maintain contact with provider office Nurse Case Manager Clinical Goal(s):  patient will verbalize understanding of plan for managing pain patient will attend all  scheduled medical appointments: Saw pcp on 09-27-2020 and will see again in March of 2023 patient will demonstrate use of different relaxation  skills and/or diversional activities to assist with pain reduction (distraction, imagery, relaxation, massage, acupressure, TENS, heat, and cold application patient will report pain at a level less than 3 to 4 on a 10-10 rating scale patient will use pharmacological and nonpharmacological pain relief strategies patient will verbalize acceptable level of pain relief and ability to engage in desired activities patient will engage in desired activities without an increase in pain level Interventions:  Collaboration with Jon Billings, NP regarding development and update of comprehensive plan of care as evidenced by provider attestation and co-signature Inter-disciplinary care team collaboration (see longitudinal plan of care) - effectiveness of pharmacologic therapy monitored. 10-05-2020: The patient ask if the pcp could prescribe a steroid for her to take like before. States she forgot to ask since she had a UTI and was dealing with that. Will collaborate with the pcp - medication-induced side effects managed - misuse of pain medication assessed - motivation and barriers to change assessed and addressed - pain assessed- 10-05-2020: The patient states she still has pain and discomfort but she knows it is from arthritis. She is using OTC products for pain relief.  - pain treatment goals reviewed Evaluation of current treatment plan related to pain in back and bilateral legs and patient's adherence to plan as established by provider. 08-15-2020: The patient states she has her pain under control at this time. She denies any new concerns with pain. Will likely close goal if she is stable at next outreach. 10-05-2020: The patient states she is still having pain and she is using OTC products. Education about trying heat application. She has not tried this but will try  this and see if it will be helpful. The patient wanted to know if the pcp could give her a script like before when she took the prednisone. Will collaborate with the pcp and ask for recommendations.  Advised patient to call the office with any changes in intensity of pain Provided education to patient [id number] therapies, adequate rest and working with PT.  Educated on falls prevention and safety Reviewed medications with patient and discussed compliance.  Is starting prednisone dose pack and muscle relaxer. 08-15-2020: Has completed dose pack and has no further issues. Will continue to monitor. 10-05-2020: Inquired about steroid dose pack. Felt it was helpful before. Will collaborate with the pcp.  Discussed plans with patient for ongoing care management follow up and provided patient with direct contact information for care management team Allow patient to maintain a diary of pain ratings, timing, precipitating events, medications, treatments, and what works best to relieve pain,  Refer to support groups and self-help groups Educate patient about the use of pharmacological interventions for pain management- antianxiety, antidepressants, NSAIDS, opioid analgesics,  Explain the importance of lifestyle modifications to effective pain management  Patient Goals/Self Care Activities:  - pain assessed- 08-15-2020: Denies pain at this time.  - pain management plan developed - pain treatment goals reviewed - patient response to treatment assessed  Self-administers medications as prescribed Attends  all scheduled provider appointments Calls pharmacy for medication refills Calls provider office for new concerns or questions Follow Up Plan: Telephone follow up appointment with care management team member scheduled for:12-01-20 at 3:45pm       Care Plan : RM CM Hypertension (Adult)  Updates made by Vanita Ingles, RN since 12/01/2020 12:00 AM  Completed 12/01/2020   Problem: RN CM HTN Resolved  12/01/2020  Priority: Medium     Long-Range Goal: RN CM Hypertension Monitored Completed 12/01/2020  Start Date: 06/02/2020  Expected End Date: 08/10/2021  Recent Progress: On track  Priority: Medium  Note:   Objective: Resolving, duplicate goal Last practice recorded BP readings:  BP Readings from Last 3 Encounters:  09/27/20 127/80  05/19/20 127/68  03/24/20 106/67     Most recent eGFR/CrCl:  Lab Results  Component Value Date   EGFR 94 09/27/2020    No components found for: CRCL Current Barriers:  Knowledge Deficits related to basic understanding of hypertension pathophysiology and self care management Knowledge Deficits related to understanding of medications prescribed for management of hypertension Does not attend all scheduled provider appointments Does not maintain contact with provider office Case Manager Clinical Goal(s):  patient will verbalize understanding of plan for hypertension management patient will attend all scheduled medical appointments: Saw pcp on 09-27-2020 and will follow up in March of 2023 patient will demonstrate improved adherence to prescribed treatment plan for hypertension as evidenced by taking all medications as prescribed, monitoring and recording blood pressure as directed, adhering to low sodium/DASH diet patient will demonstrate improved health management independence as evidenced by checking blood pressure as directed and notifying PCP if SBP>150 or DBP > 90, taking all medications as prescribe, and adhering to a low sodium diet as discussed. patient will verbalize basic understanding of hypertension disease process and self health management plan as evidenced by heart healthy diet and working with CCM team to manage health and well being Interventions:  Collaboration with Jon Billings, NP regarding development and update of comprehensive plan of care as evidenced by provider attestation and co-signature Inter-disciplinary care team  collaboration (see longitudinal plan of care) Evaluation of current treatment plan related to hypertension self management and patient's adherence to plan as established by provider. 10-05-2020: The patient states her blood pressures are stable. States readings have been in normal range. She denies any issues related to HTN. Feels great and talked about going to her high school reunion. The patient states she is eating well and needs to lose some weight but she is happy. Denies any new concerns. Does want to know if the pcp recommends her to get the 4th COVID booster vaccine. Will discuss with the pcp and let the patient know.  Provided education to patient re: stroke prevention, s/s of heart attack and stroke, DASH diet, complications of uncontrolled blood pressure Reviewed medications with patient and discussed importance of compliance. 10-05-2020: States compliance with medications.  Discussed plans with patient for ongoing care management follow up and provided patient with direct contact information for care management team Advised patient, providing education and rationale, to monitor blood pressure daily and record, calling PCP for findings outside established parameters.  Reviewed scheduled/upcoming provider appointments including: saw pcp on 09-27-2020 will see again in March of 2023 Self-Care Activities: - Self administers medications as prescribed Attends all scheduled provider appointments Calls provider office for new concerns, questions, or BP outside discussed parameters Checks BP and records as discussed Follows a low sodium diet/DASH diet Patient Goals: -  check blood pressure weekly - choose a place to take my blood pressure (home, clinic or office, retail store) - write blood pressure results in a log or diary - agree to work together to make changes - ask questions to understand - learn about high blood pressure - choices provided - decision-making supported - reassurance  provided   Follow Up Plan: Telephone follow up appointment with care management team member scheduled for: 12-01-20 at 3:45pm    Care Plan : RN CM HLD Manangement  Updates made by Vanita Ingles, RN since 12/01/2020 12:00 AM  Completed 12/01/2020   Problem: RN CM Hyperlipidemia Resolved 12/01/2020  Priority: Medium     Long-Range Goal: RN CM Hyperlipidemia Management Completed 12/01/2020  Start Date: 06/02/2020  Expected End Date: 06/11/2021  Recent Progress: On track  Priority: Medium  Note:   Current Barriers: Resolving, duplicate goal  Poorly controlled hyperlipidemia, complicated by DM, HTN Current antihyperlipidemic regimen: on Pravachol 40 mg daily Most recent lipid panel:     Component Value Date/Time   CHOL 143 05/19/2020 1057   TRIG 194 (H) 05/19/2020 1057   HDL 35 (L) 05/19/2020 1057   CHOLHDL 4.1 05/19/2020 1057   [place] 75 05/19/2020 1057   ASCVD risk enhancing conditions: age >79, DM, HTN, former smoker Unable to independently manage HLD Does not attend all scheduled provider appointments Does not contact provider office for questions/concerns RN Care Manager Clinical Goal(s):  patient will work with RN Care Manager, providers, and care team towards execution of optimized self-health management plan patient will verbalize understanding of plan for effective management for HLD patient will work with RN CM and PCP to address needs related to HLD patient will attend all scheduled medical appointments: saw pcp on 09-27-2020., next appointment March of 2023 Interventions: Collaboration with Jon Billings, NP regarding development and update of comprehensive plan of care as evidenced by provider attestation and co-signature Inter-disciplinary care team collaboration (see longitudinal plan of care) Medication review performed; medication list updated in electronic medical record.  Inter-disciplinary care team collaboration (see longitudinal plan of care) Referred  to pharmacy team for assistance with HLD medication management Evaluation of current treatment plan related to HLD and patient's adherence to plan as established by provider. 08-15-2020: The patient is doing well and denies any acute distress. The patient is eating a heart healthy/ADA diet. Says she is doing very well. Had COVID x 2 weeks ago but also doing well from that. Will continue to monitor. 10-05-2020: The patient is doing well. Denies any new concerns. Was seen in the office on 09-27-2020 and treated for UTI. The patient states that she has taken all antibiotics and is doing well. Did want to inquire about the COVID booster. Will collaborate with the pcp for recommendations.  Advised patient to call the office with any questions or concerns Provided education to patient re: heart healthy diet, adequate rest and medication compliance Reviewed scheduled/upcoming provider appointments including: follow up with PCP in October 2022 Patient Goals/Self-Care Activities: - keep a list of all the medicines I take; vitamins and herbals too - drink 6 to 8 glasses of water each day - eat 3 to 5 servings of fruits and vegetables each day - manage portion size - be open to making changes - learn my personal risk factors - choices provided - decision-making supported - reassurance provided   Follow Up Plan: Telephone follow up appointment with care management team member scheduled for:12-01-20 at 3:45pm      Care  Plan : RNCM: General Plan of Care (Adult) for Chronic Disease Management and Care Coordination Needs  Updates made by Vanita Ingles, RN since 12/01/2020 12:00 AM     Problem: RNCM: Development of Planc of Care for Chronic Disease Management (HTN, HLD, DM, Chronic Pain)   Priority: High     Long-Range Goal: RNCM: Effective Management  of Planc of Care for Chronic Disease Management (HTN, HLD, DM, Chronic Pain)   Start Date: 12/01/2020  Expected End Date: 12/01/2021  Priority: High   Note:   Current Barriers:  Knowledge Deficits related to plan of care for management of HTN, HLD, DMII, and chronic pain   Chronic Disease Management support and education needs related to HTN, HLD, DMII, and chronic pain   RNCM Clinical Goal(s):  Patient will verbalize understanding of plan for management of HTN, HLD, DMII, and Osteoarthritis as evidenced by keeping appointments, following plan of care, and working with the CCM team to effectively manage health and well being  take all medications exactly as prescribed and will call provider for medication related questions as evidenced by compliance with medications and calling for refills before running out of medications.     attend all scheduled medical appointments: needs to call the office to make an appointment with the pcp  as evidenced by seeing pcp on a regular basis and calling for an appointment that suits the patients schedule.        demonstrate improved and ongoing adherence to prescribed treatment plan for HTN, HLD, DMII, and Osteoarthritis as evidenced by stable conditions, and no acute exacerbations in conditions, compliance with medications, and dietary restrictions demonstrate a decrease in HTN, HLD, DMII, and Osteoarthritis exacerbations  as evidenced by no acute changes in chronic conditions and effective management of chronic condtions  demonstrate ongoing self health care management ability for effective management of chronic conditions  as evidenced by working with the CCM team through collaboration with Consulting civil engineer, provider, and care team.   Interventions: 1:1 collaboration with primary care provider regarding development and update of comprehensive plan of care as evidenced by provider attestation and co-signature Inter-disciplinary care team collaboration (see longitudinal plan of care) Evaluation of current treatment plan related to  self management and patient's adherence to plan as established by  provider   SDOH Barriers (Status: Goal on Track (progressing): YES.) Long Term Goal  Patient interviewed and SDOH assessment performed        Patient interviewed and appropriate assessments performed Provided patient with information about resources available and care guides to assist with changes with SDOH or new needs  Discussed plans with patient for ongoing care management follow up and provided patient with direct contact information for care management team Advised patient to call the office for changes in [place], concerns, or questions    Diabetes:  (Status: Goal on Track (progressing): YES.) Long Term Goal   Lab Results  Component Value Date   HGBA1C 6.3 (H) 09/27/2020  Assessed patient's understanding of A1c goal: <7% Provided education to patient about basic DM disease process; Reviewed medications with patient and discussed importance of medication adherence;        Reviewed prescribed diet with patient heart healthy/ADA ; Counseled on importance of regular laboratory monitoring as prescribed;        Discussed plans with patient for ongoing care management follow up and provided patient with direct contact information for care management team;      Provided patient with written educational materials related  to hypo and hyperglycemia and importance of correct treatment. 12-01-2020: Education on the patient monitoring for elevations in blood sugars due to steroid shots in bilateral hips yesterday.        Reviewed scheduled/upcoming provider appointments including: patient to call the office for an appointment ;         Advised patient, providing education and rationale, to check cbg as directed  and record        call provider for findings outside established parameters;       Review of patient status, including review of consultants reports, relevant laboratory and other test results, and medications completed;       Screening for signs and symptoms of depression related to  chronic disease state;        Assessed social determinant of health barriers;         Hyperlipidemia:  (Status: Goal on Track (progressing): YES.) Long Term Goal  Lab Results  Component Value Date   CHOL 181 09/27/2020   HDL 38 (L) 09/27/2020   LDLCALC 96 09/27/2020   TRIG 280 (H) 09/27/2020   CHOLHDL 4.8 (H) 09/27/2020     Medication review performed; medication list updated in electronic medical record.  Provider established cholesterol goals reviewed; Counseled on importance of regular laboratory monitoring as prescribed; Provided HLD educational materials; Reviewed role and benefits of statin for ASCVD risk reduction; Discussed strategies to manage statin-induced myalgias; Reviewed importance of limiting foods high in cholesterol; Reviewed exercise goals and target of 150 minutes per week;  Hypertension: (Status: Goal on Track (progressing): YES.) Last practice recorded BP readings:  BP Readings from Last 3 Encounters:  09/27/20 127/80  05/19/20 127/68  03/24/20 106/67  Most recent eGFR/CrCl:  Lab Results  Component Value Date   EGFR 94 09/27/2020    No components found for: CRCL  Evaluation of current treatment plan related to hypertension self management and patient's adherence to plan as established by provider;   Provided education to patient re: stroke prevention, s/s of heart attack and stroke; Reviewed prescribed diet heart healthy/ADA diet  Reviewed medications with patient and discussed importance of compliance;  Discussed plans with patient for ongoing care management follow up and provided patient with direct contact information for care management team; Advised patient, providing education and rationale, to monitor blood pressure daily and record, calling PCP for findings outside established parameters;  Advised patient to discuss blood pressure trends  with provider; Provided education on prescribed diet heart healthy ;  Discussed complications of poorly  controlled blood pressure such as heart disease, stroke, circulatory complications, vision complications, kidney impairment, sexual dysfunction;   Pain:  (Status: Goal on Track (progressing): YES.) Long Term Goal  Pain assessment performed. 12-01-2020: The patient had injections in bilateral hips at [facility] yesterday and states that she has "0" pain today. The patient states she slept all night last night and feels great today.  Medications reviewed. 12-01-2020: The patient had injections yesterday in bilateral hips, the provider also gave her some po pain medications to take for pain if needed.  Reviewed provider established plan for pain management; Discussed importance of adherence to all scheduled medical appointments; Counseled on the importance of reporting any/all new or changed pain symptoms or management strategies to pain management provider; Advised patient to report to care team affect of pain on daily activities; Discussed use of relaxation techniques and/or diversional activities to assist with pain reduction (distraction, imagery, relaxation, massage, acupressure, TENS, heat, and cold application; Reviewed with  patient prescribed pharmacological and nonpharmacological pain relief strategies; Advised patient to discuss changes in level of intensity of pain, or unresolved pain  with provider;  Patient Goals/Self-Care Activities: Patient will self administer medications as prescribed as evidenced by self report/primary caregiver report  Patient will attend all scheduled provider appointments as evidenced by clinician review of documented attendance to scheduled appointments and patient/caregiver report Patient will call pharmacy for medication refills as evidenced by patient report and review of pharmacy fill history as appropriate Patient will attend church or other social activities as evidenced by patient report Patient will continue to perform ADL's independently as evidenced by  patient/caregiver report Patient will continue to perform IADL's independently as evidenced by patient/caregiver report Patient will call provider office for new concerns or questions as evidenced by review of documented incoming telephone call notes and patient report Patient will work with BSW to address care coordination needs and will continue to work with the clinical team to address health care and disease management related needs as evidenced by documented adherence to scheduled care management/care coordination appointments schedule appointment with eye doctor check blood sugar at prescribed times: before meals and at bedtime, when you have symptoms of low or high blood sugar, and before and after exercise check feet daily for cuts, sores or redness enter blood sugar readings and medication or insulin into daily log take the blood sugar log to all doctor visits take the blood sugar meter to all doctor visits trim toenails straight across eat fish at least once per week fill half of plate with vegetables limit fast food meals to no more than 1 per week manage portion size prepare main meal at home 3 to 5 days each week read food labels for fat, fiber, carbohydrates and portion size reduce red meat to 2 to 3 times a week keep feet up while sitting wash and dry feet carefully every day wear comfortable, cotton socks wear comfortable, well-fitting shoes - check blood pressure daily - choose a place to take my blood pressure (home, clinic or office, retail store) - write blood pressure results in a log or diary - learn about high blood pressure - keep a blood pressure log - take blood pressure log to all doctor appointments - call doctor for signs and symptoms of high blood pressure - develop an action plan for high blood pressure - keep all doctor appointments - take medications for blood pressure exactly as prescribed - report new symptoms to your doctor - eat more whole grains,  fruits and vegetables, lean meats and healthy fats - call for medicine refill 2 or 3 days before it runs out - take all medications exactly as prescribed - call doctor with any symptoms you believe are related to your medicine - call doctor when you experience any new symptoms - go to all doctor appointments as scheduled - adhere to prescribed diet: Heart healthy/ADA diet        Plan:Telephone follow up appointment with care management team member scheduled for:  01-26-2021 at 345 pm  [place], MSN, [facility] Mobile: [phone]

## 2020-12-05 ENCOUNTER — Other Ambulatory Visit: Payer: Self-pay | Admitting: Nurse Practitioner

## 2020-12-05 NOTE — Telephone Encounter (Signed)
Requested Prescriptions  Pending Prescriptions Disp Refills  . hydrOXYzine (VISTARIL) 25 MG capsule [Pharmacy Med Name: HYDROXYZINE PAM 25 MG CAP] 90 capsule 0    Sig: TAKE 1 CAPSULE BY MOUTH THREE TIMES DAILY AS NEEDED FOR ANXIETY     Ear, Nose, and Throat:  Antihistamines Passed - 12/05/2020  8:34 AM      Passed - Valid encounter within last 12 months    Recent Outpatient Visits          2 months ago 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(HCC)   [facility] Larae Grooms, NP   6 months ago Aortic atherosclerosis (HCC)   [facility] Larae Grooms, NP   8 months ago Rash   [facility] Larae Grooms, NP   11 months ago Anxiety   [facility] [place], Corrie Dandy T, NP   12 months ago Annual physical exam   [facility] Valentino Nose, NP      Future Appointments            In 1 month Larae Grooms, NP [facility], [facility]   In 5 months  [facility], [facility]

## 2020-12-20 ENCOUNTER — Other Ambulatory Visit: Payer: Self-pay | Admitting: Nurse Practitioner

## 2020-12-20 DIAGNOSIS — E785 Hyperlipidemia, unspecified: Secondary | ICD-10-CM | POA: Diagnosis not present

## 2020-12-20 DIAGNOSIS — E1159 Type 2 diabetes mellitus with other circulatory complications: Secondary | ICD-10-CM

## 2020-12-20 DIAGNOSIS — I152 Hypertension secondary to endocrine disorders: Secondary | ICD-10-CM

## 2020-12-20 DIAGNOSIS — E1169 Type 2 diabetes mellitus with other specified complication: Secondary | ICD-10-CM | POA: Diagnosis not present

## 2020-12-20 DIAGNOSIS — Z7984 Long term (current) use of oral hypoglycemic drugs: Secondary | ICD-10-CM

## 2020-12-21 NOTE — Telephone Encounter (Signed)
Requested medications are due for refill today.  yes  Requested medications are on the active medications list.  yes  Last refill. 11/23/2020  Future visit scheduled.   yes  Notes to clinic.  Medication not assigned to a protocol.

## 2020-12-22 NOTE — Telephone Encounter (Signed)
Pt following up on refill request, as she needs to pick up today.

## 2021-01-01 ENCOUNTER — Other Ambulatory Visit: Payer: Self-pay | Admitting: Nurse Practitioner

## 2021-01-01 NOTE — Telephone Encounter (Signed)
Requested Prescriptions  Pending Prescriptions Disp Refills  . cloNIDine (CATAPRES) 0.1 MG tablet [Pharmacy Med Name: CLONIDINE HCL 0.1 MG TABLET] 90 tablet 0    Sig: Take 1 tablet (0.1 mg total) by mouth daily.     Cardiovascular:  Alpha-2 Agonists Passed - 01/01/2021  9:03 AM      Passed - Last BP in normal range    BP Readings from Last 1 Encounters:  09/27/20 127/80         Passed - Last Heart Rate in normal range    Pulse Readings from Last 1 Encounters:  09/27/20 92         Passed - Valid encounter within last 6 months    Recent Outpatient Visits          3 months ago 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(HCC)   [facility] Larae Grooms, NP   7 months ago Aortic atherosclerosis [facility])   [facility] Larae Grooms, NP   9 months ago Rash   [facility] Larae Grooms, NP   11 months ago Anxiety   [facility] [place], Corrie Dandy T, NP   1 year ago Annual physical exam   [facility] Valentino Nose, NP      Future Appointments            In 3 weeks Larae Grooms, NP [facility], [facility]   In 4 months  [facility], [facility]

## 2021-01-03 ENCOUNTER — Other Ambulatory Visit: Payer: Self-pay | Admitting: Nurse Practitioner

## 2021-01-03 NOTE — Telephone Encounter (Signed)
Requested Prescriptions  Pending Prescriptions Disp Refills   hydrOXYzine (VISTARIL) 25 MG capsule [Pharmacy Med Name: HYDROXYZINE PAM 25 MG CAP] 90 capsule 0    Sig: TAKE 1 CAPSULE BY MOUTH THREE TIMES DAILY AS NEEDED FOR ANXIETY     Ear, Nose, and Throat:  Antihistamines Passed - 01/03/2021  9:20 AM      Passed - Valid encounter within last 12 months    Recent Outpatient Visits          3 months ago 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(HCC)   [facility] Larae Grooms, NP   7 months ago Aortic atherosclerosis (HCC)   [facility] Larae Grooms, NP   9 months ago Rash   [facility] Larae Grooms, NP   11 months ago Anxiety   [facility] [place], Corrie Dandy T, NP   1 year ago Annual physical exam   [facility] Valentino Nose, NP      Future Appointments            In 3 weeks Larae Grooms, NP [facility], [facility]   In 4 months  [facility], [facility]

## 2021-01-09 ENCOUNTER — Other Ambulatory Visit: Payer: Self-pay | Admitting: Nurse Practitioner

## 2021-01-09 DIAGNOSIS — E119 Type 2 diabetes mellitus without complications: Secondary | ICD-10-CM | POA: Diagnosis not present

## 2021-01-09 DIAGNOSIS — H2513 Age-related nuclear cataract, bilateral: Secondary | ICD-10-CM | POA: Diagnosis not present

## 2021-01-09 NOTE — Telephone Encounter (Signed)
Requested Prescriptions  Pending Prescriptions Disp Refills   hydrochlorothiazide (HYDRODIURIL) 25 MG tablet [Pharmacy Med Name: HYDROCHLOROTHIAZIDE 25 MG TAB] 90 tablet 0    Sig: Take 1 tablet (25 mg total) by mouth daily.     Cardiovascular: Diuretics - Thiazide Passed - 01/09/2021  8:36 AM      Passed - Ca in normal range and within 360 days    Calcium  Date Value Ref Range Status  09/27/2020 9.2 8.7 - 10.3 mg/dL Final         Passed - Cr in normal range and within 360 days    Creatinine, Ser  Date Value Ref Range Status  09/27/2020 0.70 0.57 - 1.00 mg/dL Final         Passed - K in normal range and within 360 days    Potassium  Date Value Ref Range Status  09/27/2020 3.5 3.5 - 5.2 mmol/L Final         Passed - Na in normal range and within 360 days    Sodium  Date Value Ref Range Status  09/27/2020 139 134 - 144 mmol/L Final         Passed - Last BP in normal range    BP Readings from Last 1 Encounters:  09/27/20 127/80         Passed - Valid encounter within last 6 months    Recent Outpatient Visits          3 months ago 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(HCC)   [facility] Larae Grooms, NP   7 months ago Aortic atherosclerosis (HCC)   [facility] Larae Grooms, NP   9 months ago Rash   [facility] Larae Grooms, NP   1 year ago Anxiety   [facility] Aura Dials T, NP   1 year ago Annual physical exam   [facility] Valentino Nose, NP      Future Appointments            In 2 weeks Larae Grooms, NP [facility]   In 4 months  [facility], [facility]

## 2021-01-18 ENCOUNTER — Other Ambulatory Visit: Payer: Self-pay | Admitting: Nurse Practitioner

## 2021-01-18 NOTE — Telephone Encounter (Signed)
Requested medication (s) are due for refill today: yes  Requested medication (s) are on the active medication list: yes  Last refill:  12/22/20 #30 0 refills   Future visit scheduled: yes in 1 week   Notes to clinic:  medication not assigned to a protocol, do you want to refill Rx?     Requested Prescriptions  Pending Prescriptions Disp Refills   BELSOMRA 10 MG TABS [Pharmacy Med Name: BELSOMRA 10 MG TABLET] 30 tablet 0    Sig: Take 1 TABLET (10 mg) by mouth at bedtime.     Off-Protocol Failed - 01/18/2021 11:32 AM      Failed - Medication not assigned to a protocol, review manually.      Passed - Valid encounter within last 12 months    Recent Outpatient Visits           3 months ago 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(HCC)   [facility] Larae Grooms, NP   8 months ago Aortic atherosclerosis [facility])   [facility] Larae Grooms, NP   10 months ago Rash   [facility] Larae Grooms, NP   1 year ago Anxiety   [facility] Aura Dials T, NP   1 year ago Annual physical exam   [facility] Valentino Nose, NP       Future Appointments             In 1 week Larae Grooms, NP [facility], [facility]   In 3 months  [facility], [facility]

## 2021-01-24 ENCOUNTER — Ambulatory Visit: Payer: Medicare Other | Admitting: Nurse Practitioner

## 2021-01-25 ENCOUNTER — Other Ambulatory Visit: Payer: Self-pay

## 2021-01-25 ENCOUNTER — Telehealth: Payer: Self-pay | Admitting: Nurse Practitioner

## 2021-01-25 ENCOUNTER — Ambulatory Visit (INDEPENDENT_AMBULATORY_CARE_PROVIDER_SITE_OTHER): Payer: Medicare Other | Admitting: Nurse Practitioner

## 2021-01-25 ENCOUNTER — Encounter: Payer: Self-pay | Admitting: Nurse Practitioner

## 2021-01-25 ENCOUNTER — Other Ambulatory Visit: Payer: Self-pay | Admitting: Nurse Practitioner

## 2021-01-25 VITALS — BP 135/75 | HR 90 | Temp 98.3°F | Ht 67.0 in | Wt 168.0 lb

## 2021-01-25 DIAGNOSIS — L0390 Cellulitis, unspecified: Secondary | ICD-10-CM | POA: Diagnosis not present

## 2021-01-25 DIAGNOSIS — E1169 Type 2 diabetes mellitus with other specified complication: Secondary | ICD-10-CM

## 2021-01-25 DIAGNOSIS — E1159 Type 2 diabetes mellitus with other circulatory complications: Secondary | ICD-10-CM

## 2021-01-25 DIAGNOSIS — I700 Atherosclerosis of aorta: Secondary | ICD-10-CM | POA: Diagnosis not present

## 2021-01-25 DIAGNOSIS — I152 Hypertension secondary to endocrine disorders: Secondary | ICD-10-CM

## 2021-01-25 DIAGNOSIS — E785 Hyperlipidemia, unspecified: Secondary | ICD-10-CM | POA: Diagnosis not present

## 2021-01-25 MED ORDER — POLYMYXIN B-TRIMETHOPRIM 10000-0.1 UNIT/ML-% OP SOLN: 1.0000 [drp] | Freq: Two times a day (BID) | OPHTHALMIC | 0 refills | Status: DC

## 2021-01-25 MED ORDER — GABAPENTIN 300 MG PO CAPS
300.0000 mg | ORAL_CAPSULE | Freq: Two times a day (BID) | ORAL | 0 refills | Status: DC
Start: 2021-01-25 — End: 2021-04-19

## 2021-01-25 MED ORDER — SERTRALINE HCL 100 MG PO TABS
100.0000 mg | ORAL_TABLET | Freq: Every day | ORAL | 1 refills | Status: DC
Start: 2021-01-25 — End: 2021-06-06

## 2021-01-25 MED ORDER — MUPIROCIN 2 % EX OINT: 1.0000 "application " | TOPICAL_OINTMENT | Freq: Two times a day (BID) | CUTANEOUS | 0 refills | Status: AC

## 2021-01-25 MED ORDER — PREDNISONE 10 MG PO TABS: 10.0000 mg | ORAL_TABLET | Freq: Every day | ORAL | 0 refills | Status: DC

## 2021-01-25 MED ORDER — SULFAMETHOXAZOLE-TRIMETHOPRIM 800-160 MG PO TABS
1.0000 | ORAL_TABLET | Freq: Two times a day (BID) | ORAL | 0 refills | Status: DC
Start: 2021-01-25 — End: 2021-02-07

## 2021-01-25 NOTE — Progress Notes (Signed)
BP 135/75 Comment: home blood pressure reading   Pulse 90    Temp 98.3 F (36.8 C) (Oral)    Ht 5' 7" (1.702 m)    Wt 168 lb (76.2 kg)    SpO2 95%    BMI 26.31 kg/m    Subjective:    Patient ID: Marcia [name], female    DOB: Sep 15, 1950, 71 y.o.   MRN: [id number]  HPI: Marcia [name] is a 71 y.o. female  Chief Complaint  Patient presents with   Diabetes   Hypertension   Hyperlipidemia   Rash    Pt states she noticed a rash on her L lower leg and upper back about a week ago. States it has been itching and burning.    Eye Burn    Pt states that her R eye has been burning and red for the last 2 days. States she has woke up with it matted shut.    Depression    Pt states she feels like her medication is not working as well as it should be     HYPERTENSION / HYPERLIPIDEMIA Satisfied with current treatment? yes Duration of hypertension: years BP monitoring frequency: daily BP range: 135/80s BP medication side effects: no Past BP meds: clonidine and HCTZ Duration of hyperlipidemia: years Cholesterol medication side effects: no Cholesterol supplements: none Past cholesterol medications: pravastatin (pravachol) Medication compliance: excellent compliance Aspirin: no Recent stressors: no Recurrent headaches: no Visual changes: no Palpitations: no Dyspnea: no Chest pain: no Lower extremity edema: no Dizzy/lightheaded: no  DIABETES Hypoglycemic episodes:no Polydipsia/polyuria: no Visual disturbance: no Chest pain: no Paresthesias: no Glucose Monitoring: yes  Accucheck frequency: Daily  Fasting glucose:130  Post prandial:  Evening:  Before meals: Taking Insulin?: no  Long acting insulin:  Short acting insulin: Blood Pressure Monitoring: daily Retinal Examination: Up to Date Foot Exam: Up to Date Diabetic Education: Not Completed Pneumovax: Up to Date Influenza: Not up to Date Aspirin: no  ANXIETY Patient states the Zoloft is not working as well for her as  it was in the past.  Would like to increase the dose. Denies SI.   RASH Patient states she has a rash on her left lower leg and on her back.  The areas burn and itch.  States they have been there for about a week.  She hasn't changed any soaps, detergents or lotions.   Relevant past medical, surgical, family and social history reviewed and updated as indicated. Interim medical history since our last visit reviewed. Allergies and medications reviewed and updated.  Review of Systems  Eyes:  Negative for visual disturbance.  Respiratory:  Negative for cough, chest tightness and shortness of breath.   Cardiovascular:  Negative for chest pain, palpitations and leg swelling.  Endocrine: Negative for polydipsia and polyuria.  Genitourinary:        Pelvic pressure  Skin:  Positive for rash.  Neurological:  Negative for dizziness, numbness and headaches.  Psychiatric/Behavioral:  Negative for suicidal ideas. The patient is not nervous/anxious.    Per HPI unless specifically indicated above     Objective:    BP 135/75 Comment: home blood pressure reading   Pulse 90    Temp 98.3 F (36.8 C) (Oral)    Ht 5' 7" (1.702 m)    Wt 168 lb (76.2 kg)    SpO2 95%    BMI 26.31 kg/m   Wt Readings from Last 3 Encounters:  01/25/21 168 lb (76.2 kg)  09/27/20 171 lb (  77.6 kg)  05/19/20 176 lb 8 oz (80.1 kg)    Physical Exam Vitals and nursing note reviewed.  Constitutional:      General: She is not in acute distress.    Appearance: Normal appearance. She is normal weight. She is not ill-appearing, toxic-appearing or diaphoretic.  HENT:     Head: Normocephalic.     Right Ear: External ear normal.     Left Ear: External ear normal.     Nose: Nose normal.     Mouth/Throat:     Mouth: Mucous membranes are moist.     Pharynx: Oropharynx is clear.  Eyes:     General:        Right eye: No discharge.        Left eye: No discharge.     Extraocular Movements: Extraocular movements intact.      Conjunctiva/sclera: Conjunctivae normal.     Pupils: Pupils are equal, round, and reactive to light.  Cardiovascular:     Rate and Rhythm: Normal rate and regular rhythm.     Heart sounds: No murmur heard. Pulmonary:     Effort: Pulmonary effort is normal. No respiratory distress.     Breath sounds: Normal breath sounds. No wheezing or rales.  Abdominal:     General: Abdomen is flat. Bowel sounds are normal. There is no distension.     Palpations: Abdomen is soft.     Tenderness: There is no right CVA tenderness, left CVA tenderness or guarding.  Musculoskeletal:     Cervical back: Normal range of motion and neck supple.  Skin:    General: Skin is warm and dry.     Capillary Refill: Capillary refill takes less than 2 seconds.       Neurological:     General: No focal deficit present.     Mental Status: She is alert and oriented to person, place, and time. Mental status is at baseline.  Psychiatric:        Mood and Affect: Mood normal.        Behavior: Behavior normal.        Thought Content: Thought content normal.        Judgment: Judgment normal.    Results for orders placed or performed in visit on 01/25/21  Comp Met (CMET)  Result Value Ref Range   Glucose 99 70 - 99 mg/dL   BUN 11 8 - 27 mg/dL   Creatinine, Ser 0.75 0.57 - 1.00 mg/dL   eGFR 86 >59 mL/min/1.73   BUN/Creatinine Ratio 15 12 - 28   Sodium 141 134 - 144 mmol/L   Potassium 3.3 (L) 3.5 - 5.2 mmol/L   Chloride 97 96 - 106 mmol/L   CO2 31 (H) 20 - 29 mmol/L   Calcium 9.8 8.7 - 10.3 mg/dL   Total Protein 7.0 6.0 - 8.5 g/dL   Albumin 4.5 3.8 - 4.8 g/dL   Globulin, Total 2.5 1.5 - 4.5 g/dL   Albumin/Globulin Ratio 1.8 1.2 - 2.2   Bilirubin Total 0.5 0.0 - 1.2 mg/dL   Alkaline Phosphatase 69 44 - 121 IU/L   AST 22 0 - 40 IU/L   ALT 21 0 - 32 IU/L  HgB A1c  Result Value Ref Range   Hgb A1c MFr Bld 6.5 (H) 4.8 - 5.6 %   Est. average glucose Bld gHb Est-mCnc 140 mg/dL  Lipid Profile  Result Value Ref Range    Cholesterol, Total 163 100 - 199 mg/dL   Triglycerides  304 (H) 0 - 149 mg/dL   HDL 34 (L) >39 mg/dL   VLDL Cholesterol Cal 50 (H) 5 - 40 mg/dL   LDL Chol Calc (NIH) 79 0 - 99 mg/dL   Chol/HDL Ratio 4.8 (H) 0.0 - 4.4 ratio      Assessment & Plan:   Problem List Items Addressed This Visit       Cardiovascular and Mediastinum   Hypertension associated with diabetes ([place])    Chronic.  Controlled.  Continue with current medication regimen of Clonidine and HCTZ.  Labs ordered today.  Continue to monitor blood pressures at home.  Call if blood pressure is >140/90.  Return to clinic in 6 months for reevaluation.  Call sooner if concerns arise.        Relevant Medications   cloNIDine (CATAPRES) 0.1 MG tablet   hydrochlorothiazide (HYDRODIURIL) 25 MG tablet   metFORMIN (GLUCOPHAGE) 500 MG tablet   pravastatin (PRAVACHOL) 40 MG tablet   Other Relevant Orders   Comp Met (CMET) (Completed)   Aortic atherosclerosis (HCC)    Chronic.  Controlled.  Continue with current medication regimen on Pravastatin 45m.  Labs ordered today.  Refills sent.  Return to clinic in 6 months for reevaluation.  Call sooner if concerns arise.        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  Controlled.  Continue with current medication regimen of Metformin 5055mBID.  Labs ordered today. Will make further recommendations based on lab results. Continue to check blood pressures at home. If consistently elevated >150 call the office.  Return to clinic in 6 months for reevaluation.  Call sooner if concerns arise.        Relevant Medications   cloNIDine (CATAPRES) 0.1 MG tablet   hydrochlorothiazide (HYDRODIURIL) 25 MG tablet   metFORMIN (GLUCOPHAGE) 500 MG tablet   pravastatin (PRAVACHOL) 40 MG tablet   Other  Relevant Orders   HgB A1c (Completed)     Endocrine   Hyperlipidemia associated with type 2 diabetes mellitus (HCC)    Chronic.  Controlled.  Continue with current medication regimen of Pravastatin 402m Labs ordered today.  Return to clinic in 6 months for reevaluation.  Call sooner if concerns arise.        Relevant Medications   cloNIDine (CATAPRES) 0.1 MG tablet   hydrochlorothiazide (HYDRODIURIL) 25 MG tablet   metFORMIN (GLUCOPHAGE) 500 MG tablet   pravastatin (PRAVACHOL) 40 MG tablet   Other Relevant Orders   Lipid Profile (Completed)   Other Visit Diagnoses     Cellulitis, unspecified cellulitis site       On LLE and Back. Will treat with Prednisone and Bactrim. Can use Mupiricin ointment BID. Follow up if symptoms do not improve.        Follow up plan: Return in about 6 months (around 07/25/2021) for Physical and Fasting labs.

## 2021-01-25 NOTE — Telephone Encounter (Signed)
Pt was called and notified that rx was called into pharmacy and will be ready for pickup this evening.   [facility] called and spoke to [place], [facility] about the rx POLYTRIM eye gtts. Advised it was sent on 01/25/21 #53mL/0 refill(s). It was sent as phone in, I resent it as normal but she states that she hadn't received it yet. Went ahead and gave verbal and she stated it will be filled and ready for pick up tonight. I will call pt to let her know.

## 2021-01-25 NOTE — Telephone Encounter (Signed)
Pt was following up on this refill request, please advise pt if it will be filled.

## 2021-01-25 NOTE — Telephone Encounter (Signed)
Medication: trimethoprim-polymyxin b (POLYTRIM) ophthalmic solution [[id number]]  Pt had an ov today. Reports that the script was not sent to the below pharmacy. No pharmacy name on the script sent today. Only indicated phone in.  Has the patient contacted their pharmacy? YES (Agent: If no, request that the patient contact the pharmacy for the refill. If patient does not wish to contact the pharmacy document the reason why and proceed with request.) (Agent: If yes, when and what did the pharmacy advise?)  Preferred Pharmacy (with phone number or street name): [facility] [place] [place] - [address] [place] [address] Phone: [phone] Fax: [phone] Hours: Not open 24 hours   Has the patient been seen for an appointment in the last year OR does the patient have an upcoming appointment? YES 01/25/2021  Agent: Please be advised that RX refills may take up to 3 business days. We ask that you follow-up with your pharmacy.

## 2021-01-25 NOTE — Telephone Encounter (Signed)
Medication: gabapentin (NEURONTIN) 300 MG capsule [[id number]]   Has the patient contacted their pharmacy? YES  (Agent: If no, request that the patient contact the pharmacy for the refill. If patient does not wish to contact the pharmacy document the reason why and proceed with request.) (Agent: If yes, when and what did the pharmacy advise?)  Preferred Pharmacy (with phone number or street name): [facility] [place],  - [address] [place] [address] Phone: [phone] Fax: [phone] Hours: Not open 24 hours   Has the patient been seen for an appointment in the last year OR does the patient have an upcoming appointment? YES 01/25/21

## 2021-01-25 NOTE — Telephone Encounter (Signed)
Requested Prescriptions  Pending Prescriptions Disp Refills   gabapentin (NEURONTIN) 300 MG capsule 180 capsule 1    Sig: Take 1 capsule (300 mg total) by mouth 2 (two) times daily.     Neurology: Anticonvulsants - gabapentin Passed - 01/25/2021  6:33 PM      Passed - Valid encounter within last 12 months    Recent Outpatient Visits          Today 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[facility])   [facility] Larae Grooms, NP   4 months ago 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(HCC)   [facility] Larae Grooms, NP   8 months ago Aortic atherosclerosis (HCC)   [facility] Larae Grooms, NP   10 months ago Rash   [facility] Larae Grooms, NP   1 year ago Anxiety   [facility] [place], [place] T, NP      Future Appointments            In 3 months [facility]            Signed Prescriptions Disp Refills   trimethoprim-polymyxin b (POLYTRIM) ophthalmic solution 10 mL 0    Sig: Place 1 drop into the right eye in the morning and at bedtime for 7 days.     There is no refill protocol information for this order

## 2021-01-26 ENCOUNTER — Telehealth: Payer: Self-pay

## 2021-01-26 ENCOUNTER — Telehealth: Payer: Self-pay | Admitting: Nurse Practitioner

## 2021-01-26 ENCOUNTER — Telehealth: Payer: Medicare Other

## 2021-01-26 DIAGNOSIS — M7061 Trochanteric bursitis, right hip: Secondary | ICD-10-CM | POA: Diagnosis not present

## 2021-01-26 DIAGNOSIS — Z1231 Encounter for screening mammogram for malignant neoplasm of breast: Secondary | ICD-10-CM

## 2021-01-26 DIAGNOSIS — M7062 Trochanteric bursitis, left hip: Secondary | ICD-10-CM | POA: Diagnosis not present

## 2021-01-26 LAB — COMPREHENSIVE METABOLIC PANEL
ALT: 21 IU/L (ref 0–32)
AST: 22 IU/L (ref 0–40)
Albumin/Globulin Ratio: 1.8 (ref 1.2–2.2)
Albumin: 4.5 g/dL (ref 3.8–4.8)
Alkaline Phosphatase: 69 IU/L (ref 44–121)
BUN/Creatinine Ratio: 15 (ref 12–28)
BUN: 11 mg/dL (ref 8–27)
Bilirubin Total: 0.5 mg/dL (ref 0.0–1.2)
CO2: 31 mmol/L — ABNORMAL HIGH (ref 20–29)
Calcium: 9.8 mg/dL (ref 8.7–10.3)
Chloride: 97 mmol/L (ref 96–106)
Creatinine, Ser: 0.75 mg/dL (ref 0.57–1.00)
Globulin, Total: 2.5 g/dL (ref 1.5–4.5)
Glucose: 99 mg/dL (ref 70–99)
Potassium: 3.3 mmol/L — ABNORMAL LOW (ref 3.5–5.2)
Sodium: 141 mmol/L (ref 134–144)
Total Protein: 7 g/dL (ref 6.0–8.5)
eGFR: 86 mL/min/{1.73_m2} (ref >59)

## 2021-01-26 LAB — LIPID PANEL
Chol/HDL Ratio: 4.8 ratio — ABNORMAL HIGH (ref 0.0–4.4)
Cholesterol, Total: 163 mg/dL (ref 100–199)
HDL: 34 mg/dL — ABNORMAL LOW (ref >39)
LDL Chol Calc (NIH): 79 mg/dL (ref 0–99)
Triglycerides: 304 mg/dL — ABNORMAL HIGH (ref 0–149)
VLDL Cholesterol Cal: 50 mg/dL — ABNORMAL HIGH (ref 5–40)

## 2021-01-26 LAB — HEMOGLOBIN A1C
Est. average glucose Bld gHb Est-mCnc: 140 mg/dL
Hgb A1c MFr Bld: 6.5 % — ABNORMAL HIGH (ref 4.8–5.6)

## 2021-01-26 MED ORDER — METFORMIN HCL 500 MG PO TABS: 500.0000 mg | ORAL_TABLET | Freq: Two times a day (BID) | ORAL | 1 refills | Status: DC

## 2021-01-26 MED ORDER — HYDROXYZINE PAMOATE 25 MG PO CAPS: ORAL_CAPSULE | ORAL | 1 refills | Status: DC

## 2021-01-26 MED ORDER — POLYMYXIN B-TRIMETHOPRIM 10000-0.1 UNIT/ML-% OP SOLN: 1.0000 [drp] | Freq: Two times a day (BID) | OPHTHALMIC | 0 refills | Status: AC

## 2021-01-26 MED ORDER — HYDROCHLOROTHIAZIDE 25 MG PO TABS: 25.0000 mg | ORAL_TABLET | Freq: Every day | ORAL | 1 refills | Status: DC

## 2021-01-26 MED ORDER — CLONIDINE HCL 0.1 MG PO TABS: 0.1000 mg | ORAL_TABLET | Freq: Every day | ORAL | 1 refills | Status: DC

## 2021-01-26 MED ORDER — PRAVASTATIN SODIUM 40 MG PO TABS: 40.0000 mg | ORAL_TABLET | Freq: Every day | ORAL | 1 refills | Status: DC

## 2021-01-26 NOTE — Assessment & Plan Note (Signed)
Chronic.  Controlled.  Continue with current medication regimen on Pravastatin 40mg.  Labs ordered today.  Refills sent.  Return to clinic in 6 months for reevaluation.  Call sooner if concerns arise.   

## 2021-01-26 NOTE — Telephone Encounter (Signed)
Copied from [place] [id number]. Topic: General - Other >> Jan 25, 2021  4:52 PM Yvette Rack wrote: Reason for CRM: Pt stated she went to her pharmacy to pick up her medications but she was advised that the Rx for trimethoprim-polymyxin b (POLYTRIM) ophthalmic solution and gabapentin (NEURONTIN) 300 MG capsule had not been received. Pt asked that the Rx for both medications be sent to her pharmacy asap.

## 2021-01-26 NOTE — Progress Notes (Signed)
Please let patient know that overall her lab work looks good. Her potassium is slightly low. I would like her to come back and recheck that in two weeks to make sure it comes back to normal.  Patient's A1c is still well controlled at 6.5.  No need to change medications at this time.   Cholesterol is elevated. I recommend she decrease her processed food and refined sugar intake.  Please let me know if she has any questions.  Please make her a two week lab appt.

## 2021-01-26 NOTE — Assessment & Plan Note (Signed)
Chronic.  Controlled.  Continue with current medication regimen of Clonidine and HCTZ.  Labs ordered today.  Continue to monitor blood pressures at home.  Call if blood pressure is >140/90.  Return to clinic in 6 months for reevaluation.  Call sooner if concerns arise.  ? ?

## 2021-01-26 NOTE — Assessment & Plan Note (Signed)
Chronic.  Controlled.  Continue with current medication regimen of Metformin 500mg  BID.  Labs ordered today. Will make further recommendations based on lab results. Continue to check blood pressures at home. If consistently elevated >150 call the office.  Return to clinic in 6 months for reevaluation.  Call sooner if concerns arise.

## 2021-01-26 NOTE — Assessment & Plan Note (Signed)
Chronic.  Controlled.  Continue with current medication regimen of Pravastatin 40mg.  Labs ordered today.  Return to clinic in 6 months for reevaluation.  Call sooner if concerns arise.   

## 2021-01-26 NOTE — Telephone Encounter (Signed)
Order pended

## 2021-01-26 NOTE — Telephone Encounter (Signed)
Copied from [place] [id number]. Topic: Referral - Request for Referral >> Jan 26, 2021  8:19 AM Leward Quan A wrote: Has patient seen PCP for this complaint? Yes.   *If NO, is insurance requiring patient see PCP for this issue before PCP can refer them? Referral for which specialty: Radiology Preferred provider/office: [facility]  Reason for referral: mammogram

## 2021-01-26 NOTE — Telephone Encounter (Signed)
°  Care Management   Follow Up Note   01/26/2021 Name: Marcia [name] MRN: [id number] DOB: April 30, 1950   Referred by: Larae Grooms, NP Reason for referral : Chronic Care Management (RNCM: Follow up for Chronic Disease Management and Care Coordination Needs )   An unsuccessful telephone outreach was attempted today. The patient was referred to the case management team for assistance with care management and care coordination.   Follow Up Plan: A HIPPA compliant phone message was left for the patient providing contact information and requesting a return call.   Alto Denver RN, MSN, CCM Community Care Coordinator [place]   [facility] Mobile: [phone]

## 2021-01-26 NOTE — Telephone Encounter (Signed)
Requested Prescriptions  Pending Prescriptions Disp Refills   gabapentin (NEURONTIN) 300 MG capsule [Pharmacy Med Name: GABAPENTIN 300 MG CAPSULE] 180 capsule 0    Sig: Take 1 capsule (300 mg total) by mouth 2 (two) times daily.     Neurology: Anticonvulsants - gabapentin Passed - 01/25/2021  6:34 PM      Passed - Valid encounter within last 12 months    Recent Outpatient Visits          Yesterday 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[facility])   [facility] Larae Grooms, NP   4 months ago 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[facility])   [facility] Larae Grooms, NP   8 months ago Aortic atherosclerosis [facility])   [facility] Larae Grooms, NP   10 months ago Rash   [facility] Larae Grooms, NP   1 year ago Anxiety   [facility] [place], Dorie Rank, NP      Future Appointments            In 3 months [facility]

## 2021-01-26 NOTE — Addendum Note (Signed)
Addended by: Jon Billings on: 01/26/2021 09:56 AM   Modules accepted: Orders

## 2021-01-29 ENCOUNTER — Ambulatory Visit: Payer: Self-pay | Admitting: *Deleted

## 2021-01-29 MED ORDER — PREDNISONE 10 MG PO TABS: 10.0000 mg | ORAL_TABLET | Freq: Every day | ORAL | 0 refills | Status: DC

## 2021-01-29 NOTE — Telephone Encounter (Signed)
Spoke with patient and made patient aware of Larae Grooms, NP recommendations. Patient verbalized understanding. Advised patient to give our office a call back if she has any concerns.

## 2021-01-29 NOTE — Telephone Encounter (Signed)
I sent in an additional course of prednisone.  I don't want to send in the Bactrim (antibiotic) again if it is improving so we do not risk side effects.  She can continue with the topical cream until it resolves also.

## 2021-01-29 NOTE — Addendum Note (Signed)
Addended by: Larae Grooms on: 01/29/2021 09:13 AM   Modules accepted: Orders

## 2021-01-29 NOTE — Telephone Encounter (Signed)
Reason for Disposition  [1] Caller has URGENT medicine question about med that PCP or specialist prescribed AND [2] triager unable to answer question  Answer Assessment - Initial Assessment Questions 1. NAME of MEDICATION: "What medicine are you calling about?"     Prednisone and penicillin 2. QUESTION: "What is your question?" (e.g., double dose of medicine, side effect)     I was seen by Marcia Grooms, NP and given the above medications for breaking out.   I'm much better but still have a few places left.   Would Clydie Braun be willing to prescribe me another round of the prednisone and penicillin? 3. PRESCRIBING HCP: "Who prescribed it?" Reason: if prescribed by specialist, call should be referred to that group.     Marcia [name] 4. SYMPTOMS: "Do you have any symptoms?"     I'm much better but still have a few places I feel like an extra round of medication would clear me up. 5. SEVERITY: If symptoms are present, ask "Are they mild, moderate or severe?"     N/A 6. PREGNANCY:  "Is there any chance that you are pregnant?" "When was your last menstrual period?"     Not asked  Protocols used: Medication Question Call-A-AH She would like it called into [facility].

## 2021-01-30 ENCOUNTER — Other Ambulatory Visit: Payer: Self-pay | Admitting: Nurse Practitioner

## 2021-01-30 DIAGNOSIS — Z1231 Encounter for screening mammogram for malignant neoplasm of breast: Secondary | ICD-10-CM

## 2021-02-07 ENCOUNTER — Telehealth: Payer: Self-pay | Admitting: Nurse Practitioner

## 2021-02-07 MED ORDER — SULFAMETHOXAZOLE-TRIMETHOPRIM 800-160 MG PO TABS: 1.0000 | ORAL_TABLET | Freq: Two times a day (BID) | ORAL | 0 refills | Status: DC

## 2021-02-07 NOTE — Telephone Encounter (Signed)
Sent in repeat course of Bactrim sent in for patient. If not resolved with this course please have her make an appointment for evaluation.

## 2021-02-07 NOTE — Telephone Encounter (Signed)
Patient called to ask why is she needing the refill on Bactrim DS, advised it was prescribed for cellulitis of leg and back and noted to follow up if no improvement. She says it has improved, but not all the way gone and she wants to know if Marcia [name] would prescribe it to get rid of it completely. Advised I will send this Marcia [name] and someone will call back with her recommendation.

## 2021-02-07 NOTE — Telephone Encounter (Signed)
Patient verbalized understanding  

## 2021-02-07 NOTE — Telephone Encounter (Signed)
Copied from CRM [id number]. Topic: Quick Communication - Rx Refill/Question >> Feb 07, 2021  8:38 AM Jaquita Rector A wrote: Medication: sulfamethoxazole-trimethoprim (BACTRIM DS) 800-160 MG tablet   Has the patient contacted their pharmacy? No. Medication given without refill (Agent: If no, request that the patient contact the pharmacy for the refill. If patient does not wish to contact the pharmacy document the reason why and proceed with request.) (Agent: If yes, when and what did the pharmacy advise?)  Preferred Pharmacy (with phone number or street name): [facility] [place] [place] - [address]  Phone:  [phone] Fax:  [phone]    Has the patient been seen for an appointment in the last year OR does the patient have an upcoming appointment? Yes.    Agent: Please be advised that RX refills may take up to 3 business days. We ask that you follow-up with your pharmacy.

## 2021-02-08 ENCOUNTER — Other Ambulatory Visit: Payer: Commercial Managed Care - HMO

## 2021-02-09 ENCOUNTER — Other Ambulatory Visit: Payer: Commercial Managed Care - HMO

## 2021-02-09 ENCOUNTER — Other Ambulatory Visit: Payer: Self-pay

## 2021-02-09 DIAGNOSIS — E1159 Type 2 diabetes mellitus with other circulatory complications: Secondary | ICD-10-CM

## 2021-02-10 LAB — COMPREHENSIVE METABOLIC PANEL
ALT: 33 IU/L — ABNORMAL HIGH (ref 0–32)
AST: 21 IU/L (ref 0–40)
Albumin/Globulin Ratio: 1.6 (ref 1.2–2.2)
Albumin: 3.9 g/dL (ref 3.8–4.8)
Alkaline Phosphatase: 57 IU/L (ref 44–121)
BUN/Creatinine Ratio: 51 — ABNORMAL HIGH (ref 12–28)
BUN: 26 mg/dL (ref 8–27)
Bilirubin Total: <0.2 mg/dL (ref 0.0–1.2)
CO2: 22 mmol/L (ref 20–29)
Calcium: 9 mg/dL (ref 8.7–10.3)
Chloride: 102 mmol/L (ref 96–106)
Creatinine, Ser: 0.51 mg/dL — ABNORMAL LOW (ref 0.57–1.00)
Globulin, Total: 2.4 g/dL (ref 1.5–4.5)
Glucose: 117 mg/dL — ABNORMAL HIGH (ref 70–99)
Potassium: 3.7 mmol/L (ref 3.5–5.2)
Sodium: 143 mmol/L (ref 134–144)
Total Protein: 6.3 g/dL (ref 6.0–8.5)
eGFR: 101 mL/min/{1.73_m2} (ref >59)

## 2021-02-12 ENCOUNTER — Telehealth: Payer: Self-pay

## 2021-02-12 ENCOUNTER — Telehealth: Payer: Self-pay | Admitting: Nurse Practitioner

## 2021-02-12 NOTE — Chronic Care Management (AMB) (Signed)
°  Care Management   Note  02/12/2021 Name: Marcia [name] MRN: [id number] DOB: 1950/12/14  Marcia [name] is a 71 y.o. year old female who is a primary care patient of Larae Grooms, NP and is actively engaged with the care management team. I reached out to [name] by phone today to assist with re-scheduling a follow up visit with the RN Case Manager  Follow up plan: Unsuccessful telephone outreach attempt made. A HIPAA compliant phone message was left for the patient providing contact information and requesting a return call.  The care management team will reach out to the patient again over the next 7 days.  If patient returns call to provider office, please advise to call Embedded Care Management Care Guide Penne Lash  at [phone]  Penne Lash, RMA Care Guide, Embedded Care Coordination [facility]  [place], [place] [address] Direct Dial: [phone] [email or web address]: [email or web address]

## 2021-02-12 NOTE — Progress Notes (Signed)
Please let patient know her lab work looks good.  Remind her to drink plenty of water.  Her potassium returned to normal range.

## 2021-02-12 NOTE — Chronic Care Management (AMB) (Signed)
°  Care Management   Note  02/12/2021 Name: MEGGIN FELTZ MRN: [id number] DOB: 05-27-1950  EMERSEN KELSAY is a 71 y.o. year old female who is a primary care patient of Jon Billings, NP and is actively engaged with the care management team. I reached out to Encarnacion Chu by phone today to assist with re-scheduling a follow up visit with the RN Case Manager  Follow up plan: Telephone appointment with care management team member scheduled for:03/20/2021  Noreene Larsson, [place], [place], [place] [address] Direct Dial: [phone] [email or web address]

## 2021-02-12 NOTE — Telephone Encounter (Signed)
Patient made aware of results and verbalized understanding.  

## 2021-02-12 NOTE — Telephone Encounter (Signed)
Pt is calling for her lab results. Pt is wanting to know was her potassium increased? Please advise [phone]

## 2021-02-19 ENCOUNTER — Other Ambulatory Visit: Payer: Self-pay | Admitting: Nurse Practitioner

## 2021-02-19 NOTE — Telephone Encounter (Signed)
Requested medication (s) are due for refill today:   Provider to review  Requested medication (s) are on the active medication list:   Yes  Future visit scheduled:   Yes   Last ordered: 01/19/2021 #30, 0 refills  Returned because no protocol assigned to this medication.   Requested Prescriptions  Pending Prescriptions Disp Refills   BELSOMRA 10 MG TABS [Pharmacy Med Name: BELSOMRA 10 MG TABLET] 30 tablet 0    Sig: Take 1 TABLET (10 mg) by mouth at bedtime.     Off-Protocol Failed - 02/19/2021  8:33 AM      Failed - Medication not assigned to a protocol, review manually.      Passed - Valid encounter within last 12 months    Recent Outpatient Visits           3 weeks ago 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(HCC)   [facility] Larae [facility], NP   4 months ago 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[facility])   [facility] Larae Grooms, NP   9 months ago Aortic atherosclerosis [facility])   [facility] Larae Grooms, NP   11 months ago Rash   [facility] Larae Grooms, NP   1 year ago Anxiety   [facility] [place], Dorie Rank, NP       Future Appointments             In 2 months [facility]

## 2021-02-27 ENCOUNTER — Other Ambulatory Visit: Payer: Self-pay

## 2021-02-27 ENCOUNTER — Ambulatory Visit
Admission: RE | Admit: 2021-02-27 | Discharge: 2021-02-27 | Disposition: A | Discharge status: Home / Self Care | Payer: Medicare Other | Source: Ambulatory Visit | Attending: Nurse Practitioner | Admitting: Nurse Practitioner

## 2021-02-27 DIAGNOSIS — Z1231 Encounter for screening mammogram for malignant neoplasm of breast: Secondary | ICD-10-CM | POA: Diagnosis not present

## 2021-02-28 NOTE — Progress Notes (Signed)
Please let patient know her Mammogram did not show any evidence of a malignancy.  The recommendation is to repeat the Mammogram in 1 year.  

## 2021-03-20 ENCOUNTER — Ambulatory Visit (INDEPENDENT_AMBULATORY_CARE_PROVIDER_SITE_OTHER): Payer: Medicare Other

## 2021-03-20 ENCOUNTER — Telehealth: Payer: Self-pay

## 2021-03-20 ENCOUNTER — Telehealth: Payer: Commercial Managed Care - HMO

## 2021-03-20 DIAGNOSIS — E1159 Type 2 diabetes mellitus with other circulatory complications: Secondary | ICD-10-CM

## 2021-03-20 DIAGNOSIS — E1169 Type 2 diabetes mellitus with other specified complication: Secondary | ICD-10-CM | POA: Diagnosis not present

## 2021-03-20 DIAGNOSIS — M79604 Pain in right leg: Secondary | ICD-10-CM

## 2021-03-20 DIAGNOSIS — E785 Hyperlipidemia, unspecified: Secondary | ICD-10-CM | POA: Diagnosis not present

## 2021-03-20 DIAGNOSIS — I152 Hypertension secondary to endocrine disorders: Secondary | ICD-10-CM | POA: Diagnosis not present

## 2021-03-20 NOTE — Telephone Encounter (Signed)
°  Care Management   Follow Up Note   03/20/2021 Name: Marcia [name] MRN: [id number] DOB: April 27, 1950   Referred by: Larae Grooms, NP Reason for referral : Chronic Care Management (RNCM: Follow up for Chronic Disease Management and Care Coordination Needs)   Patient returned call to the [facility] and the call was completed. See new encounter.   Follow Up Plan: Telephone follow up appointment with care management team member scheduled for: 05-22-2021 at 230 pm  SIGNATURE

## 2021-03-20 NOTE — Chronic Care Management (AMB) (Signed)
Chronic Care Management   CCM RN Visit Note  03/20/2021 Name: Marcia [name] MRN: [id number] DOB: [birth year]  Subjective: Marcia [name] is a 71 y.o. year old female who is a primary care patient of Jon Billings, NP. The care management team was consulted for assistance with disease management and care coordination needs.    Engaged with patient by telephone for follow up visit in response to provider referral for case management and/or care coordination services.   Consent to Services:  The patient was given information about Chronic Care Management services, agreed to services, and gave verbal consent prior to initiation of services.  Please see initial visit note for detailed documentation.   Patient agreed to services and verbal consent obtained.   Assessment: Review of patient past medical history, allergies, medications, health status, including review of consultants reports, laboratory and other test data, was performed as part of comprehensive evaluation and provision of chronic care management services.   SDOH (Social Determinants of Health) assessments and interventions performed:    CCM Care Plan  No Known Allergies  Outpatient Encounter Medications as of 03/20/2021  Medication Sig   BELSOMRA 10 MG TABS Take 1 TABLET (10 mg) by mouth at bedtime.   Blood Glucose Monitoring Suppl (ONE TOUCH ULTRA 2) w/Device KIT AS DIRECTED.   cloNIDine (CATAPRES) 0.1 MG tablet Take 1 tablet (0.1 mg total) by mouth daily.   fluticasone (FLONASE) 50 MCG/ACT nasal spray Place 2 sprays into both nostrils daily.   gabapentin (NEURONTIN) 300 MG capsule Take 1 capsule (300 mg total) by mouth 2 (two) times daily.   hydrochlorothiazide (HYDRODIURIL) 25 MG tablet Take 1 tablet (25 mg total) by mouth daily.   hydrOXYzine (VISTARIL) 25 MG capsule TAKE 1 CAPSULE BY MOUTH THREE TIMES DAILY AS NEEDED FOR ANXIETY   Lancets (ONETOUCH DELICA PLUS [id number]) MISC Apply 1 each topically daily.    metFORMIN (GLUCOPHAGE) 500 MG tablet Take 1 tablet (500 mg total) by mouth 2 (two) times daily with a meal.   ONETOUCH ULTRA test strip    pravastatin (PRAVACHOL) 40 MG tablet Take 1 tablet (40 mg total) by mouth daily.   predniSONE (DELTASONE) 10 MG tablet Take 1 tablet (10 mg total) by mouth daily with breakfast. Take 6 on day 1, 5 on day 2, and decrease by 1 each day until course is complete.   sertraline (ZOLOFT) 100 MG tablet Take 1 tablet (100 mg total) by mouth daily.   sulfamethoxazole-trimethoprim (BACTRIM DS) 800-160 MG tablet Take 1 tablet by mouth 2 (two) times daily.   tiZANidine (ZANAFLEX) 4 MG tablet Take 1 tablet (4 mg total) by mouth at bedtime as needed for muscle spasms.   No facility-administered encounter medications on file as of 03/20/2021.    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    Conditions to be addressed/monitored:HTN, HLD, DMII, and Chronic pain   Care Plan : RNCM: General Plan of Care (Adult) for Chronic Disease Management and Care Coordination Needs  Updates made by Marcia Ingles, RN since 03/20/2021 12:00 AM     Problem: RNCM: Development  of Planc of Care for Chronic Disease Management (HTN, HLD, DM, Chronic Pain)   Priority: High     Long-Range Goal: RNCM: Effective Management  of Planc of Care for Chronic Disease Management (HTN, HLD, DM, Chronic Pain)   Start Date: 12/01/2020  Expected End Date: 12/01/2021  Priority: High  Note:   Current Barriers:  Knowledge Deficits related to plan of care for management of HTN, HLD,  DMII, and chronic pain   Chronic Disease Management support and education needs related to HTN, HLD, DMII, and chronic pain   RNCM Clinical Goal(s):  Patient will verbalize understanding of plan for management of HTN, HLD, DMII, and Osteoarthritis as evidenced by keeping appointments, following plan of care, and working with the CCM team to effectively manage health and well being  take all medications exactly as prescribed and will call provider for medication related questions as evidenced by compliance with medications and calling for refills before running out of medications.     attend all scheduled medical appointments: needs to call the office to make an appointment with the pcp  as evidenced by seeing pcp on a regular basis and calling for an appointment that suits the patients schedule.        demonstrate improved and ongoing adherence to prescribed treatment plan for HTN, HLD, DMII, and Osteoarthritis as evidenced by stable conditions, and no acute exacerbations in conditions, compliance with medications, and dietary restrictions demonstrate a decrease in HTN, HLD, DMII, and Osteoarthritis exacerbations  as evidenced by no acute changes in chronic conditions and effective management of chronic condtions  demonstrate ongoing self health care management ability for effective management of chronic conditions  as evidenced by working with the CCM team through collaboration with Consulting civil engineer, provider, and care team.   Interventions: 1:1 collaboration with primary care provider regarding development and update of comprehensive plan of care as evidenced by provider attestation and co-signature Inter-disciplinary care team collaboration (see longitudinal plan of care) Evaluation of current treatment plan related to  self management and patient's adherence to plan as established by provider   SDOH Barriers (Status: Goal on Track (progressing): YES.) Long Term Goal  Patient interviewed and SDOH  assessment performed        Patient interviewed and appropriate assessments performed Provided patient with information about resources available and care guides to assist with changes with SDOH or new needs  Discussed plans with patient for ongoing care management follow up and provided patient with direct contact information for care management team Advised patient to call the office for changes in [place], concerns, or questions    Diabetes:  (Status: Goal on Track (progressing): YES.) Long Term Goal   Lab Results  Component Value Date   HGBA1C 6.5 (H) 01/25/2021  Assessed patient's understanding of A1c goal: <7% Provided education to patient about basic DM disease process; Reviewed medications with patient and discussed importance of medication adherence. 03-20-2021: the patient is compliant with medications        Reviewed prescribed diet with patient heart healthy/ADA. 03-20-2021: The patient is compliant with heart healthy/ADA diet. She states that she sometimes sneaks and eats things she should not but for the most part she eats things that are good for her ; Counseled on importance of regular laboratory monitoring as prescribed. 03-20-2021: The patient has regular lab work. Last in January;        Discussed plans with patient for ongoing care management follow up and provided patient with direct contact information  for care management team;      Provided patient with written educational materials related to hypo and hyperglycemia and importance of correct treatment. 12-01-2020: Education on the patient monitoring for elevations in blood sugars due to steroid shots in bilateral hips yesterday.   03-20-2021: The patient states that she is stable with her blood sugars. Denies any real lows or highs     Reviewed scheduled/upcoming provider appointments including:03-27-2021 at 3 pm  Advised patient, providing education and rationale, to check cbg as directed  and record        call provider for  findings outside established parameters;       Review of patient status, including review of consultants reports, relevant laboratory and other test results, and medications completed;       Screening for signs and symptoms of depression related to chronic disease state;        Assessed social determinant of health barriers;    03-20-2021: Has regular eye exams. States that she will see the eye doctor in July      Hyperlipidemia:  (Status: Goal on Track (progressing): YES.) Long Term Goal  Lab Results  Component Value Date   CHOL 163 01/25/2021   HDL 34 (L) 01/25/2021   LDLCALC 79 01/25/2021   TRIG 304 (H) 01/25/2021   CHOLHDL 4.8 (H) 01/25/2021     Medication review performed; medication list updated in electronic medical record. 03-20-2021:The patient is taking Pravastatin 40 mg QD Provider established cholesterol goals reviewed. 03-20-2021: Review of cholesterol goals and dietary habits that can help with the management of cholesterol; Counseled on importance of regular laboratory monitoring as prescribed. 03-20-2021: The patient states that she has regular lab work.  Provided HLD educational materials; Reviewed role and benefits of statin for ASCVD risk reduction; Discussed strategies to manage statin-induced myalgias; Reviewed importance of limiting foods high in cholesterol; Reviewed exercise goals and target of 150 minutes per week; States she has an upcoming appointment on 03-27-2021 to get her ears cleaned out. She is not having issues but feels she has some wax build up and wants to be safe.   Hypertension: (Status: Goal on Track (progressing): YES.) Last practice recorded BP readings:  BP Readings from Last 3 Encounters:  01/25/21 135/75  09/27/20 127/80  05/19/20 127/68  Most recent eGFR/CrCl:  Lab Results  Component Value Date   EGFR 101 02/09/2021    No components found for: CRCL  Evaluation of current treatment plan related to hypertension self management and patient's  adherence to plan as established by provider. 03-20-2021: The patient states that her blood pressures at home are stable as well. The patient denies any acute findings related to HTN or heart health. Will continue to monitor for changes. She is happy today and is looking forward to her Rudene Anda on Thursday.  She states she can hardly believe she will be 71 years old.    Provided education to patient re: stroke prevention, s/s of heart attack and stroke; Reviewed prescribed diet heart healthy/ADA diet. 03-20-2021: is compliant with heart healthy/ADA diet  Reviewed medications with patient and discussed importance of compliance. 03-20-2021: Is compliant with medications  Discussed plans with patient for ongoing care management follow up and provided patient with direct contact information for care management team; Advised patient, providing education and rationale, to monitor blood pressure daily and record, calling PCP for findings outside established parameters;  Advised patient to discuss blood pressure trends  with provider; Provided education on prescribed diet heart healthy ;  Discussed complications of poorly controlled blood pressure such as heart disease, stroke, circulatory complications, vision complications, kidney impairment, sexual dysfunction;   Pain:  (Status: Goal on Track (progressing): YES.) Long Term Goal  Pain assessment performed. 12-01-2020: The patient had injections in bilateral hips at [facility] yesterday and states that she has "0" pain today. The patient states she slept all night last night and feels great today. 03-20-2021: The patient states her pain is at an 8 today on a scale of 0-10. She states that she is going to call the specialist and see about getting back in to be seen. She says that she knows it is the OA and that sometimes there is not a lot that can be done about it. Empathetic listening and support given.  Medications reviewed. 12-01-2020: The patient had  injections yesterday in bilateral hips, the provider also gave her some po pain medications to take for pain if needed.  Reviewed provider established plan for pain management. 03-20-2021: Sees the specialist when needed. Has a follow up next week with the pcp Discussed importance of adherence to all scheduled medical appointments. 03-27-2021 at 3 pm; Counseled on the importance of reporting any/all new or changed pain symptoms or management strategies to pain management provider; Advised patient to report to care team affect of pain on daily activities; Discussed use of relaxation techniques and/or diversional activities to assist with pain reduction (distraction, imagery, relaxation, massage, acupressure, TENS, heat, and cold application; Reviewed with patient prescribed pharmacological and nonpharmacological pain relief strategies; Advised patient to discuss changes in level of intensity of pain, or unresolved pain  with provider;  Patient Goals/Self-Care Activities: Patient will self administer medications as prescribed as evidenced by self report/primary caregiver report  Patient will attend all scheduled provider appointments as evidenced by clinician review of documented attendance to scheduled appointments and patient/caregiver report Patient will call pharmacy for medication refills as evidenced by patient report and review of pharmacy fill history as appropriate Patient will attend church or other social activities as evidenced by patient report Patient will continue to perform ADL's independently as evidenced by patient/caregiver report Patient will continue to perform IADL's independently as evidenced by patient/caregiver report Patient will call provider office for new concerns or questions as evidenced by review of documented incoming telephone call notes and patient report Patient will work with BSW to address care coordination needs and will continue to work with the clinical team to  address health care and disease management related needs as evidenced by documented adherence to scheduled care management/care coordination appointments schedule appointment with eye doctor check blood sugar at prescribed times: before meals and at bedtime, when you have symptoms of low or high blood sugar, and before and after exercise check feet daily for cuts, sores or redness enter blood sugar readings and medication or insulin into daily log take the blood sugar log to all doctor visits take the blood sugar meter to all doctor visits trim toenails straight across eat fish at least once per week fill half of plate with vegetables limit fast food meals to no more than 1 per week manage portion size prepare main meal at home 3 to 5 days each week read food labels for fat, fiber, carbohydrates and portion size reduce red meat to 2 to 3 times a week keep feet up while sitting wash and dry feet carefully every day wear comfortable, cotton socks wear comfortable, well-fitting shoes - check blood pressure daily - choose a place to take  my blood pressure (home, clinic or office, retail store) - write blood pressure results in a log or diary - learn about high blood pressure - keep a blood pressure log - take blood pressure log to all doctor appointments - call doctor for signs and symptoms of high blood pressure - develop an action plan for high blood pressure - keep all doctor appointments - take medications for blood pressure exactly as prescribed - report new symptoms to your doctor - eat more whole grains, fruits and vegetables, lean meats and healthy fats - call for medicine refill 2 or 3 days before it runs out - take all medications exactly as prescribed - call doctor with any symptoms you believe are related to your medicine - call doctor when you experience any new symptoms - go to all doctor appointments as scheduled - adhere to prescribed diet: Heart healthy/ADA diet         Plan:Telephone follow up appointment with care management team member scheduled for:  05-22-2021 at 230 pm  [place], MSN, [facility] Mobile: [phone]

## 2021-03-20 NOTE — Patient Instructions (Signed)
Visit Information  Thank you for taking time to visit with me today. Please don't hesitate to contact me if I can be of assistance to you before our next scheduled telephone appointment.  Following are the goals we discussed today:  RNCM Clinical Goal(s):  Patient will verbalize understanding of plan for management of HTN, HLD, DMII, and Osteoarthritis as evidenced by keeping appointments, following plan of care, and working with the CCM team to effectively manage health and well being  take all medications exactly as prescribed and will call provider for medication related questions as evidenced by compliance with medications and calling for refills before running out of medications.     attend all scheduled medical appointments: needs to call the office to make an appointment with the pcp  as evidenced by seeing pcp on a regular basis and calling for an appointment that suits the patients schedule.        demonstrate improved and ongoing adherence to prescribed treatment plan for HTN, HLD, DMII, and Osteoarthritis as evidenced by stable conditions, and no acute exacerbations in conditions, compliance with medications, and dietary restrictions demonstrate a decrease in HTN, HLD, DMII, and Osteoarthritis exacerbations  as evidenced by no acute changes in chronic conditions and effective management of chronic condtions  demonstrate ongoing self health care management ability for effective management of chronic conditions  as evidenced by working with the CCM team through collaboration with Consulting civil engineer, provider, and care team.    Interventions: 1:1 collaboration with primary care provider regarding development and update of comprehensive plan of care as evidenced by provider attestation and co-signature Inter-disciplinary care team collaboration (see longitudinal plan of care) Evaluation of current treatment plan related to  self management and patient's adherence to plan as established by  provider     SDOH Barriers (Status: Goal on Track (progressing): YES.) Long Term Goal  Patient interviewed and SDOH assessment performed        Patient interviewed and appropriate assessments performed Provided patient with information about resources available and care guides to assist with changes with SDOH or new needs  Discussed plans with patient for ongoing care management follow up and provided patient with direct contact information for care management team Advised patient to call the office for changes in [place], concerns, or questions       Diabetes:  (Status: Goal on Track (progressing): YES.) Long Term Goal         Lab Results  Component Value Date    HGBA1C 6.5 (H) 01/25/2021  Assessed patient's understanding of A1c goal: <7% Provided education to patient about basic DM disease process; Reviewed medications with patient and discussed importance of medication adherence. 03-20-2021: the patient is compliant with medications        Reviewed prescribed diet with patient heart healthy/ADA. 03-20-2021: The patient is compliant with heart healthy/ADA diet. She states that she sometimes sneaks and eats things she should not but for the most part she eats things that are good for her ; Counseled on importance of regular laboratory monitoring as prescribed. 03-20-2021: The patient has regular lab work. Last in January;        Discussed plans with patient for ongoing care management follow up and provided patient with direct contact information for care management team;      Provided patient with written educational materials related to hypo and hyperglycemia and importance of correct treatment. 12-01-2020: Education on the patient monitoring for elevations in blood sugars due to steroid shots in  bilateral hips yesterday.   03-20-2021: The patient states that she is stable with her blood sugars. Denies any real lows or highs     Reviewed scheduled/upcoming provider appointments  including:03-27-2021 at 3 pm  Advised patient, providing education and rationale, to check cbg as directed  and record        call provider for findings outside established parameters;       Review of patient status, including review of consultants reports, relevant laboratory and other test results, and medications completed;       Screening for signs and symptoms of depression related to chronic disease state;        Assessed social determinant of health barriers;    03-20-2021: Has regular eye exams. States that she will see the eye doctor in July       Hyperlipidemia:  (Status: Goal on Track (progressing): YES.) Long Term Goal       Lab Results  Component Value Date    CHOL 163 01/25/2021    HDL 34 (L) 01/25/2021    LDLCALC 79 01/25/2021    TRIG 304 (H) 01/25/2021    CHOLHDL 4.8 (H) 01/25/2021      Medication review performed; medication list updated in electronic medical record. 03-20-2021:The patient is taking Pravastatin 40 mg QD Provider established cholesterol goals reviewed. 03-20-2021: Review of cholesterol goals and dietary habits that can help with the management of cholesterol; Counseled on importance of regular laboratory monitoring as prescribed. 03-20-2021: The patient states that she has regular lab work.  Provided HLD educational materials; Reviewed role and benefits of statin for ASCVD risk reduction; Discussed strategies to manage statin-induced myalgias; Reviewed importance of limiting foods high in cholesterol; Reviewed exercise goals and target of 150 minutes per week; States she has an upcoming appointment on 03-27-2021 to get her ears cleaned out. She is not having issues but feels she has some wax build up and wants to be safe.    Hypertension: (Status: Goal on Track (progressing): YES.) Last practice recorded BP readings:     BP Readings from Last 3 Encounters:  01/25/21 135/75  09/27/20 127/80  05/19/20 127/68  Most recent eGFR/CrCl:       Lab Results   Component Value Date    EGFR 101 02/09/2021    No components found for: CRCL   Evaluation of current treatment plan related to hypertension self management and patient's adherence to plan as established by provider. 03-20-2021: The patient states that her blood pressures at home are stable as well. The patient denies any acute findings related to HTN or heart health. Will continue to monitor for changes. She is happy today and is looking forward to her Rudene Anda on Thursday.  She states she can hardly believe she will be 71 years old.    Provided education to patient re: stroke prevention, s/s of heart attack and stroke; Reviewed prescribed diet heart healthy/ADA diet. 03-20-2021: is compliant with heart healthy/ADA diet  Reviewed medications with patient and discussed importance of compliance. 03-20-2021: Is compliant with medications  Discussed plans with patient for ongoing care management follow up and provided patient with direct contact information for care management team; Advised patient, providing education and rationale, to monitor blood pressure daily and record, calling PCP for findings outside established parameters;  Advised patient to discuss blood pressure trends  with provider; Provided education on prescribed diet heart healthy ;  Discussed complications of poorly controlled blood pressure such as heart disease, stroke, circulatory complications, vision complications,  kidney impairment, sexual dysfunction;    Pain:  (Status: Goal on Track (progressing): YES.) Long Term Goal  Pain assessment performed. 12-01-2020: The patient had injections in bilateral hips at [facility] yesterday and states that she has "0" pain today. The patient states she slept all night last night and feels great today. 03-20-2021: The patient states her pain is at an 8 today on a scale of 0-10. She states that she is going to call the specialist and see about getting back in to be seen. She says that she knows it  is the OA and that sometimes there is not a lot that can be done about it. Empathetic listening and support given.  Medications reviewed. 12-01-2020: The patient had injections yesterday in bilateral hips, the provider also gave her some po pain medications to take for pain if needed.  Reviewed provider established plan for pain management. 03-20-2021: Sees the specialist when needed. Has a follow up next week with the pcp Discussed importance of adherence to all scheduled medical appointments. 03-27-2021 at 3 pm; Counseled on the importance of reporting any/all new or changed pain symptoms or management strategies to pain management provider; Advised patient to report to care team affect of pain on daily activities; Discussed use of relaxation techniques and/or diversional activities to assist with pain reduction (distraction, imagery, relaxation, massage, acupressure, TENS, heat, and cold application; Reviewed with patient prescribed pharmacological and nonpharmacological pain relief strategies; Advised patient to discuss changes in level of intensity of pain, or unresolved pain  with provider;   Patient Goals/Self-Care Activities: Patient will self administer medications as prescribed as evidenced by self report/primary caregiver report  Patient will attend all scheduled provider appointments as evidenced by clinician review of documented attendance to scheduled appointments and patient/caregiver report Patient will call pharmacy for medication refills as evidenced by patient report and review of pharmacy fill history as appropriate Patient will attend church or other social activities as evidenced by patient report Patient will continue to perform ADL's independently as evidenced by patient/caregiver report Patient will continue to perform IADL's independently as evidenced by patient/caregiver report Patient will call provider office for new concerns or questions as evidenced by review of  documented incoming telephone call notes and patient report Patient will work with BSW to address care coordination needs and will continue to work with the clinical team to address health care and disease management related needs as evidenced by documented adherence to scheduled care management/care coordination appointments schedule appointment with eye doctor check blood sugar at prescribed times: before meals and at bedtime, when you have symptoms of low or high blood sugar, and before and after exercise check feet daily for cuts, sores or redness enter blood sugar readings and medication or insulin into daily log take the blood sugar log to all doctor visits take the blood sugar meter to all doctor visits trim toenails straight across eat fish at least once per week fill half of plate with vegetables limit fast food meals to no more than 1 per week manage portion size prepare main meal at home 3 to 5 days each week read food labels for fat, fiber, carbohydrates and portion size reduce red meat to 2 to 3 times a week keep feet up while sitting wash and dry feet carefully every day wear comfortable, cotton socks wear comfortable, well-fitting shoes - check blood pressure daily - choose a place to take my blood pressure (home, clinic or office, retail store) - write blood pressure results  in a log or diary - learn about high blood pressure - keep a blood pressure log - take blood pressure log to all doctor appointments - call doctor for signs and symptoms of high blood pressure - develop an action plan for high blood pressure - keep all doctor appointments - take medications for blood pressure exactly as prescribed - report new symptoms to your doctor - eat more whole grains, fruits and vegetables, lean meats and healthy fats - call for medicine refill 2 or 3 days before it runs out - take all medications exactly as prescribed - call doctor with any symptoms you believe are  related to your medicine - call doctor when you experience any new symptoms - go to all doctor appointments as scheduled - adhere to prescribed diet: Heart healthy/ADA diet           Our next appointment is by telephone on 05-22-2021  at 230 pm  Please call the care guide team at [phone] if you need to cancel or reschedule your appointment.   If you are experiencing a Mental Health or [place] or need someone to talk to, please call the Suicide and Crisis Lifeline: 988 call the [place] National Suicide Prevention Lifeline: (514)100-6768 or TTY: 661-605-4623 TTY 250-645-0839) to talk to a trained counselor call 1-800-273-TALK (toll free, 24 hour hotline)   The patient verbalized understanding of instructions, educational materials, and care plan provided today and declined offer to receive copy of patient instructions, educational materials, and care plan.   Noreene Larsson RN, MSN, [facility] Mobile: [phone]

## 2021-03-26 DIAGNOSIS — M7062 Trochanteric bursitis, left hip: Secondary | ICD-10-CM | POA: Diagnosis not present

## 2021-03-26 DIAGNOSIS — M7061 Trochanteric bursitis, right hip: Secondary | ICD-10-CM | POA: Diagnosis not present

## 2021-03-27 ENCOUNTER — Encounter: Payer: Self-pay | Admitting: Internal Medicine

## 2021-03-27 ENCOUNTER — Ambulatory Visit (INDEPENDENT_AMBULATORY_CARE_PROVIDER_SITE_OTHER): Payer: Medicare Other | Admitting: Internal Medicine

## 2021-03-27 ENCOUNTER — Other Ambulatory Visit: Payer: Self-pay

## 2021-03-27 ENCOUNTER — Ambulatory Visit: Payer: Medicare Other | Admitting: Unknown Physician Specialty

## 2021-03-27 VITALS — BP 108/80 | HR 78 | Temp 98.3°F | Ht 67.01 in | Wt 174.4 lb

## 2021-03-27 DIAGNOSIS — H938X3 Other specified disorders of ear, bilateral: Secondary | ICD-10-CM

## 2021-03-27 MED ORDER — DEBROX 6.5 % OT SOLN: 5.0000 [drp] | Freq: Two times a day (BID) | OTIC | 0 refills | Status: DC

## 2021-03-27 NOTE — Progress Notes (Signed)
? ?BP 108/80   Pulse 78   Temp 98.3 ?F (36.8 ?C) (Oral)   Ht 5' 7.01" (1.702 m)   Wt 174 lb 6.4 oz (79.1 kg)   SpO2 96%   BMI 27.31 kg/m?   ? ?Subjective:  ? ? Patient ID: Marcia [name], female    DOB: 1951-01-10, 71 y.o.   MRN: [id number] ? ?Chief Complaint  ?Patient presents with  ?? ear issue  ?  Started 2 to 3 days ago  ? ? ?HPI: ?Marcia [name] is a 71 y.o. female ? ?Ear Fullness  ?There is pain in both ears. This is a new problem. The current episode started in the past 7 days. There has been no fever. Pertinent negatives include no abdominal pain, coughing, diarrhea, ear discharge, headaches, hearing loss, neck pain, rash, rhinorrhea, sore throat or vomiting.  ? ?Chief Complaint  ?Patient presents with  ?? ear issue  ?  Started 2 to 3 days ago  ? ? ?Relevant past medical, surgical, family and social history reviewed and updated as indicated. Interim medical history since our last visit reviewed. ?Allergies and medications reviewed and updated. ? ?Review of Systems  ?HENT:  Negative for ear discharge, hearing loss, rhinorrhea and sore throat.   ?Respiratory:  Negative for cough.   ?Gastrointestinal:  Negative for abdominal pain, diarrhea and vomiting.  ?Musculoskeletal:  Negative for neck pain.  ?Skin:  Negative for rash.  ?Neurological:  Negative for headaches.  ? ?Per HPI unless specifically indicated above ? ?   ?Objective:  ?  ?BP 108/80   Pulse 78   Temp 98.3 ?F (36.8 ?C) (Oral)   Ht 5' 7.01" (1.702 m)   Wt 174 lb 6.4 oz (79.1 kg)   SpO2 96%   BMI 27.31 kg/m?   ?Wt Readings from Last 3 Encounters:  ?03/27/21 174 lb 6.4 oz (79.1 kg)  ?01/25/21 168 lb (76.2 kg)  ?09/27/20 171 lb (77.6 kg)  ?  ?Physical Exam ?Vitals and nursing note reviewed.  ?Constitutional:   ?   General: She is not in acute distress. ?   Appearance: Normal appearance. She is not ill-appearing or diaphoretic.  ?HENT:  ?   Right Ear: External ear normal. There is impacted cerumen.  ?   Left Ear: External ear normal. There is  no impacted cerumen.  ?   Nose: No congestion or rhinorrhea.  ?   Mouth/Throat:  ?   Pharynx: No oropharyngeal exudate or posterior oropharyngeal erythema.  ?Pulmonary:  ?   Breath sounds: No rhonchi.  ?Skin: ?   General: Skin is warm and dry.  ?   Coloration: Skin is not jaundiced.  ?   Findings: No erythema.  ?Neurological:  ?   Mental Status: She is alert.  ? ?Results for orders placed or performed in visit on 02/09/21  ?Comp Met (CMET)  ?Result Value Ref Range  ? Glucose 117 (H) 70 - 99 mg/dL  ? BUN 26 8 - 27 mg/dL  ? Creatinine, Ser 0.51 (L) 0.57 - 1.00 mg/dL  ? eGFR 101 >59 mL/min/1.73  ? BUN/Creatinine Ratio 51 (H) 12 - 28  ? Sodium 143 134 - 144 mmol/L  ? Potassium 3.7 3.5 - 5.2 mmol/L  ? Chloride 102 96 - 106 mmol/L  ? CO2 22 20 - 29 mmol/L  ? Calcium 9.0 8.7 - 10.3 mg/dL  ? Total Protein 6.3 6.0 - 8.5 g/dL  ? Albumin 3.9 3.8 - 4.8 g/dL  ? Globulin, Total 2.4 1.5 -  4.5 g/dL  ? Albumin/Globulin Ratio 1.6 1.2 - 2.2  ? Bilirubin Total <0.2 0.0 - 1.2 mg/dL  ? Alkaline Phosphatase 57 44 - 121 IU/L  ? AST 21 0 - 40 IU/L  ? ALT 33 (H) 0 - 32 IU/L  ? ?   ? ? ?Current Outpatient Medications:  ??  BELSOMRA 10 MG TABS, Take 1 TABLET (10 mg) by mouth at bedtime., Disp: 30 tablet, Rfl: 2 ??  Blood Glucose Monitoring Suppl (ONE TOUCH ULTRA 2) w/Device KIT, AS DIRECTED., Disp: 1 kit, Rfl: 0 ??  carbamide peroxide (DEBROX) 6.5 % OTIC solution, Place 5 drops into both ears 2 (two) times daily., Disp: 15 mL, Rfl: 0 ??  cloNIDine (CATAPRES) 0.1 MG tablet, Take 1 tablet (0.1 mg total) by mouth daily., Disp: 90 tablet, Rfl: 1 ??  fluticasone (FLONASE) 50 MCG/ACT nasal spray, Place 2 sprays into both nostrils daily., Disp: 16 g, Rfl: 6 ??  gabapentin (NEURONTIN) 300 MG capsule, Take 1 capsule (300 mg total) by mouth 2 (two) times daily., Disp: 180 capsule, Rfl: 0 ??  hydrochlorothiazide (HYDRODIURIL) 25 MG tablet, Take 1 tablet (25 mg total) by mouth daily., Disp: 90 tablet, Rfl: 1 ??  hydrOXYzine (VISTARIL) 25 MG capsule, TAKE  1 CAPSULE BY MOUTH THREE TIMES DAILY AS NEEDED FOR ANXIETY, Disp: 90 capsule, Rfl: 1 ??  Lancets (ONETOUCH DELICA PLUS [id number]) MISC, Apply 1 each topically daily., Disp: , Rfl:  ??  metFORMIN (GLUCOPHAGE) 500 MG tablet, Take 1 tablet (500 mg total) by mouth 2 (two) times daily with a meal., Disp: 180 tablet, Rfl: 1 ??  ONETOUCH ULTRA test strip, , Disp: , Rfl:  ??  pravastatin (PRAVACHOL) 40 MG tablet, Take 1 tablet (40 mg total) by mouth daily., Disp: 90 tablet, Rfl: 1 ??  sertraline (ZOLOFT) 100 MG tablet, Take 1 tablet (100 mg total) by mouth daily., Disp: 90 tablet, Rfl: 1 ??  tiZANidine (ZANAFLEX) 4 MG tablet, Take 1 tablet (4 mg total) by mouth at bedtime as needed for muscle spasms., Disp: 30 tablet, Rfl: 2 ??  meloxicam (MOBIC) 15 MG tablet, Take 15 mg by mouth daily., Disp: , Rfl:   ? ? ?Assessment & Plan:  ?Cerumen impaction ?Will start pt on debrox otic suspension  ?If symptoms dont get better will need to fu with [place] for possible ear clean/ disimpaction. ? ? ?Problem List Items Addressed This Visit   ?None ?Visit Diagnoses   ? ? Sensation of fullness in both ears    -  Primary  ? ?  ?  ? ?No orders of the defined types were placed in this encounter. ?  ? ?Meds ordered this encounter  ?Medications  ?? carbamide peroxide (DEBROX) 6.5 % OTIC solution  ?  Sig: Place 5 drops into both ears 2 (two) times daily.  ?  Dispense:  15 mL  ?  Refill:  0  ?  ? ?Follow up plan: ?No follow-ups on file. ? ? ?

## 2021-04-04 ENCOUNTER — Telehealth: Payer: Self-pay | Admitting: Nurse Practitioner

## 2021-04-04 MED ORDER — HYDROXYZINE PAMOATE 25 MG PO CAPS: ORAL_CAPSULE | ORAL | 1 refills | Status: DC

## 2021-04-04 NOTE — Addendum Note (Signed)
Addended by: Matilde Sprang on: 04/04/2021 03:59 PM ? ? Modules accepted: Orders ? ?

## 2021-04-04 NOTE — Telephone Encounter (Addendum)
[facility] [employer] called and spoke to Vernona Rieger, Pensions consultant about the refill(s) Hydralazine requested. Advised it was sent on 01/26/21 #90/1 refill(s). She says it was received and picked up by the patient last on 03/07/21. She says they received the refusal saying too early to refill, but it's not too early for the refill. Advised a refill will be sent. ? ?Requested Prescriptions  ?Pending Prescriptions Disp Refills  ? hydrOXYzine (VISTARIL) 25 MG capsule 90 capsule 1  ?  Sig: TAKE 1 CAPSULE BY MOUTH THREE TIMES DAILY AS NEEDED FOR ANXIETY  ?  ? There is no refill protocol information for this order  ?  ?Refused Prescriptions Disp Refills  ? hydrOXYzine (VISTARIL) 25 MG capsule [Pharmacy Med Name: HYDROXYZINE PAM 25 MG CAP] 90 capsule 0  ?  Sig: TAKE 1 CAPSULE BY MOUTH THREE TIMES DAILY AS NEEDED FOR ANXIETY  ?  ? Ear, Nose, and Throat:  Antihistamines 2 Failed - 04/04/2021  8:41 AM  ?  ?  Failed - Cr in normal range and within 360 days  ?  Creatinine, Ser  ?Date Value Ref Range Status  ?02/09/2021 0.51 (L) 0.57 - 1.00 mg/dL Final  ?  Comment:  ?  **Verified by repeat analysis**  ?  ?  ?  ?  Passed - Valid encounter within last 12 months  ?  Recent Outpatient Visits   ? ?      ? 1 week ago Sensation of fullness in both ears  ? [facility] Vigg, Avanti, MD  ? 2 months ago 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(HCC)  ? [facility] Larae Grooms, NP  ? 6 months ago 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(HCC)  ? [facility] Larae Grooms, NP  ? 10 months ago Aortic atherosclerosis (HCC)  ? [facility] Larae Grooms, NP  ? 1 year ago Rash  ? [facility] Larae Grooms, NP  ? ?  ?  ?Future Appointments   ? ?        ? In 1 month [facility], [facility]   ? ?  ? ?  ?  ?  ?  ? ?

## 2021-04-04 NOTE — Telephone Encounter (Signed)
Requested medication (s) are due for refill today:  Yes ? ?Requested medication (s) are on the active medication list:   Yes ? ?Future visit scheduled:   No  Seen a mo. ago ? ? ?Last ordered: 01/26/2021 #90, 1 refill ? ?Returned because protocol criteria not met.   Cr. Is low  ? ?Requested Prescriptions  ?Pending Prescriptions Disp Refills  ? hydrOXYzine (VISTARIL) 25 MG capsule [Pharmacy Med Name: HYDROXYZINE PAM 25 MG CAP] 90 capsule 0  ?  Sig: TAKE 1 CAPSULE BY MOUTH THREE TIMES DAILY AS NEEDED FOR ANXIETY  ?  ? Ear, Nose, and Throat:  Antihistamines 2 Failed - 04/04/2021  8:41 AM  ?  ?  Failed - Cr in normal range and within 360 days  ?  Creatinine, Ser  ?Date Value Ref Range Status  ?02/09/2021 0.51 (L) 0.57 - 1.00 mg/dL Final  ?  Comment:  ?  **Verified by repeat analysis**  ?  ?  ?  ?  Passed - Valid encounter within last 12 months  ?  Recent Outpatient Visits   ? ?      ? 1 week ago Sensation of fullness in both ears  ? [facility] Vigg, Avanti, MD  ? 2 months ago 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([place])  ? [place], NP  ? 6 months ago 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([place])  ? [place], NP  ? 10 months ago Aortic atherosclerosis ([place])  ? [place], NP  ? 1 year ago Rash  ? [facility] Jon Billings, NP  ? ?  ?  ?Future Appointments   ? ?        ? In 1 month [place], [facility]   ? ?  ? ?  ?  ?  ? ?

## 2021-04-04 NOTE — Telephone Encounter (Signed)
Pt wanted to know why hydrOXYzine (VISTARIL) 25 MG capsule  says requested too soon, when she hs been taking the bills the way she sissupposed to. Please call back ?

## 2021-04-17 ENCOUNTER — Other Ambulatory Visit: Payer: Self-pay | Admitting: Nurse Practitioner

## 2021-04-19 NOTE — Telephone Encounter (Signed)
Requested Prescriptions  ?Pending Prescriptions Disp Refills  ?? gabapentin (NEURONTIN) 300 MG capsule [Pharmacy Med Name: GABAPENTIN 300 MG CAPSULE] 180 capsule 0  ?  Sig: Take 1 capsule (300 mg total) by mouth 2 (two) times daily.  ?  ? Neurology: Anticonvulsants - gabapentin Failed - 04/17/2021  2:56 PM  ?  ?  Failed - Cr in normal range and within 360 days  ?  Creatinine, Ser  ?Date Value Ref Range Status  ?02/09/2021 0.51 (L) 0.57 - 1.00 mg/dL Final  ?  Comment:  ?  **Verified by repeat analysis**  ?   ?  ?  Passed - Completed PHQ-2 or PHQ-9 in the last 360 days  ?  ?  Passed - Valid encounter within last 12 months  ?  Recent Outpatient Visits   ?      ? 3 weeks ago Sensation of fullness in both ears  ? [facility] Vigg, Avanti, MD  ? 2 months ago 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([place])  ? [place], NP  ? 6 months ago 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([place])  ? [place], NP  ? 11 months ago Aortic atherosclerosis ([place])  ? [place], NP  ? 1 year ago Rash  ? [facility] Jon Billings, NP  ?  ?  ?Future Appointments   ?        ? In 3 weeks  [facility], [facility]  ? In 1 month Jon Billings, NP [facility], [facility]  ?  ? ?  ?  ?  ? ?

## 2021-05-10 ENCOUNTER — Other Ambulatory Visit: Payer: Self-pay | Admitting: Nurse Practitioner

## 2021-05-10 NOTE — Telephone Encounter (Signed)
Requested Prescriptions  ?Pending Prescriptions Disp Refills  ?? Lancets (ONETOUCH DELICA PLUS LANCET33G) MISC [Pharmacy Med Name: Dola Argyle PLUS 33G LANCT] 100 each 0  ?  Sig: CHECK BLOOD SUGAR ONCE DAILY.  ?  ? Endocrinology: Diabetes - Testing Supplies Passed - 05/10/2021  8:47 AM  ?  ?  Passed - Valid encounter within last 12 months  ?  Recent Outpatient Visits   ?      ? 1 month ago Sensation of fullness in both ears  ? [facility] Vigg, Avanti, MD  ? 3 months ago 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(HCC)  ? [facility] Larae Grooms, NP  ? 7 months ago 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(HCC)  ? [facility] Larae Grooms, NP  ? 11 months ago Aortic atherosclerosis (HCC)  ? [facility] Larae Grooms, NP  ? 1 year ago Rash  ? [facility] Larae Grooms, NP  ?  ?  ?Future Appointments   ?        ? In 4 days  [facility]  ? In 3 weeks Larae Grooms, NP [facility], [facility]  ?  ? ?  ?  ?  ? ? ?

## 2021-05-14 ENCOUNTER — Ambulatory Visit: Payer: Medicare Other

## 2021-05-14 ENCOUNTER — Other Ambulatory Visit: Payer: Self-pay | Admitting: Nurse Practitioner

## 2021-05-14 DIAGNOSIS — M7061 Trochanteric bursitis, right hip: Secondary | ICD-10-CM | POA: Diagnosis not present

## 2021-05-14 DIAGNOSIS — M7062 Trochanteric bursitis, left hip: Secondary | ICD-10-CM | POA: Diagnosis not present

## 2021-05-15 NOTE — Telephone Encounter (Signed)
Pt is calling back to follow up on medication refill.  ?Pt stated she takes her last tablet tomorrow.  ?

## 2021-05-16 MED ORDER — BELSOMRA 10 MG PO TABS: ORAL_TABLET | ORAL | 2 refills | Status: DC

## 2021-05-16 NOTE — Telephone Encounter (Signed)
Requested medication (s) are due for refill today- yes ? ?Requested medication (s) are on the active medication list -yes ? ?Future visit scheduled --yes ? ?Last refill: 02/19/21 ? ?Notes to clinic: Duplicate request attached to pended request ? ?Requested Prescriptions  ?Pending Prescriptions Disp Refills  ? BELSOMRA 10 MG TABS [Pharmacy Med Name: BELSOMRA 10 MG TABLET] 30 tablet 0  ?  Sig: Take 1 TABLET (10 mg) by mouth at bedtime.  ?  ? Off-Protocol Failed - 05/16/2021 10:25 AM  ?  ?  Failed - Medication not assigned to a protocol, review manually.  ?  ?  Passed - Valid encounter within last 12 months  ?  Recent Outpatient Visits   ? ?      ? 1 month ago Sensation of fullness in both ears  ? [facility] Vigg, Avanti, MD  ? 3 months ago 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([place])  ? [place], NP  ? 7 months ago 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([place])  ? [place], NP  ? 12 months ago Aortic atherosclerosis ([place])  ? Schl[place]er, NP  ? 1 year ago Rash  ? [facility] Jon Billings, NP  ? ?  ?  ?Future Appointments   ? ?        ? In 3 weeks Jon Billings, NP [facility], [facility]  ? ?  ? ? ?  ?  ?  ? ? ? ?Requested Prescriptions  ?Pending Prescriptions Disp Refills  ? BELSOMRA 10 MG TABS [Pharmacy Med Name: BELSOMRA 10 MG TABLET] 30 tablet 0  ?  Sig: Take 1 TABLET (10 mg) by mouth at bedtime.  ?  ? Off-Protocol Failed - 05/16/2021 10:25 AM  ?  ?  Failed - Medication not assigned to a protocol, review manually.  ?  ?  Passed - Valid encounter within last 12 months  ?  Recent Outpatient Visits   ? ?      ? 1 month ago Sensation of fullness in both ears  ? [facility] Vigg, Avanti, MD  ? 3 months ago 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([place])  ? [place], NP  ? 7  months ago 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([place])  ? [place], NP  ? 12 months ago Aortic atherosclerosis ([place])  ? [place], NP  ? 1 year ago Rash  ? [facility] Jon Billings, NP  ? ?  ?  ?Future Appointments   ? ?        ? In 3 weeks Jon Billings, NP [facility], [facility]  ? ?  ? ? ?  ?  ?  ? ? ? ?

## 2021-05-16 NOTE — Addendum Note (Signed)
Addended by: Larae Grooms on: 05/16/2021 11:09 AM ? ? Modules accepted: Orders ? ?

## 2021-05-16 NOTE — Telephone Encounter (Signed)
Pt stated she has no more medication and would like an update on the refill request.  ?

## 2021-05-16 NOTE — Telephone Encounter (Signed)
Requested medication (s) are due for refill today: yes ? ?Requested medication (s) are on the active medication list: yes ? ?Last refill:  02/19/21 ? ?Future visit scheduled: yes ? ?Notes to clinic:  Unable to refill per protocol, cannot delegate. Medication is not attached to protocol, review manually.  ? ? ? ?  ?Requested Prescriptions  ?Pending Prescriptions Disp Refills  ? BELSOMRA 10 MG TABS [Pharmacy Med Name: BELSOMRA 10 MG TABLET] 30 tablet 0  ?  Sig: Take 1 TABLET (10 mg) by mouth at bedtime.  ?  ? Off-Protocol Failed - 05/16/2021  9:11 AM  ?  ?  Failed - Medication not assigned to a protocol, review manually.  ?  ?  Passed - Valid encounter within last 12 months  ?  Recent Outpatient Visits   ? ?      ? 1 month ago Sensation of fullness in both ears  ? [facility] Vigg, Avanti, MD  ? 3 months ago 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(HCC)  ? [facility] Holdsworth, Karen, NP  ? 7 months ago 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(HCC)  ? [facility] Holdsworth, Karen, NP  ? 12 months ago Aortic atherosclerosis (HCC)  ? [facility] Holdsworth, Karen, NP  ? 1 year ago Rash  ? [facility] Holdsworth, Karen, NP  ? ?  ?  ?Future Appointments   ? ?        ? In 3 weeks Holdsworth, Karen, NP [facility], [facility]  ? ?  ? ? ?  ?  ?  ? ? ?

## 2021-05-16 NOTE — Telephone Encounter (Signed)
Requested medication (s) are due for refill today: yes ? ?Requested medication (s) are on the active medication list: yes ? ?Last refill:  02/19/21 ? ?Future visit scheduled: yes ? ?Notes to clinic:  Unable to refill per protocol, cannot delegate. Medication is not attached to protocol, review manually.  ? ? ? ?  ?Requested Prescriptions  ?Pending Prescriptions Disp Refills  ? BELSOMRA 10 MG TABS [Pharmacy Med Name: BELSOMRA 10 MG TABLET] 30 tablet 0  ?  Sig: Take 1 TABLET (10 mg) by mouth at bedtime.  ?  ? Off-Protocol Failed - 05/16/2021  9:11 AM  ?  ?  Failed - Medication not assigned to a protocol, review manually.  ?  ?  Passed - Valid encounter within last 12 months  ?  Recent Outpatient Visits   ? ?      ? 1 month ago Sensation of fullness in both ears  ? [facility] Vigg, Avanti, MD  ? 3 months ago 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(HCC)  ? [facility] Larae Grooms, NP  ? 7 months ago 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(HCC)  ? [facility] Larae Grooms, NP  ? 12 months ago Aortic atherosclerosis (HCC)  ? [facility] Larae Grooms, NP  ? 1 year ago Rash  ? [facility] Larae Grooms, NP  ? ?  ?  ?Future Appointments   ? ?        ? In 3 weeks Larae Grooms, NP [facility], [facility]  ? ?  ? ? ?  ?  ?  ? ? ?

## 2021-05-22 ENCOUNTER — Telehealth: Payer: Medicare Other

## 2021-05-22 ENCOUNTER — Telehealth: Payer: Self-pay

## 2021-05-22 ENCOUNTER — Ambulatory Visit (INDEPENDENT_AMBULATORY_CARE_PROVIDER_SITE_OTHER): Payer: Medicare Other

## 2021-05-22 DIAGNOSIS — M79604 Pain in right leg: Secondary | ICD-10-CM

## 2021-05-22 DIAGNOSIS — E1159 Type 2 diabetes mellitus with other circulatory complications: Secondary | ICD-10-CM

## 2021-05-22 DIAGNOSIS — E1169 Type 2 diabetes mellitus with other specified complication: Secondary | ICD-10-CM

## 2021-05-22 NOTE — Telephone Encounter (Signed)
?  Care Management  ? ?Follow Up Note ? ? ?05/22/2021 ?Name: Marcia [name] MRN: [id number] DOB: 1950-10-20 ? ? ?Referred by: Jon Billings, NP ?Reason for referral : Chronic Care Management (RNCM: Follow up for Chronic Disease Management and Care Coordination Needs ) ? ? ?Patient returned call back to the [facility] and the call was completed. See new encounter.  ? ?Follow Up Plan: Telephone follow up appointment with care management team member scheduled for: 07-31-2021 at 230 pm ? ?Noreene Larsson RN, MSN, CCM ?Community Care Coordinator ?[facility] ?[place] ?Mobile: [phone]  ?

## 2021-05-22 NOTE — Chronic Care Management (AMB) (Signed)
?Chronic Care Management  ? ?CCM RN Visit Note ? ?05/22/2021 ?Name: Marcia [name] MRN: [id number] DOB: 30-Apr-1950 ? ?Subjective: ?Marcia [name] is a 71 y.o. year old female who is a primary care patient of Jon Billings, NP. The care management team was consulted for assistance with disease management and care coordination needs.   ? ?Engaged with patient by telephone for follow up visit in response to provider referral for case management and/or care coordination services.  ? ?Consent to Services:  ?The patient was given information about Chronic Care Management services, agreed to services, and gave verbal consent prior to initiation of services.  Please see initial visit note for detailed documentation.  ? ?Patient agreed to services and verbal consent obtained.  ? ?Assessment: Review of patient past medical history, allergies, medications, health status, including review of consultants reports, laboratory and other test data, was performed as part of comprehensive evaluation and provision of chronic care management services.  ? ?SDOH (Social Determinants of Health) assessments and interventions performed:   ? ?CCM Care Plan ? ?No Known Allergies ? ?Outpatient Encounter Medications as of 05/22/2021  ?Medication Sig  ? Blood Glucose Monitoring Suppl (ONE TOUCH ULTRA 2) w/Device KIT AS DIRECTED.  ? carbamide peroxide (DEBROX) 6.5 % OTIC solution Place 5 drops into both ears 2 (two) times daily.  ? cloNIDine (CATAPRES) 0.1 MG tablet Take 1 tablet (0.1 mg total) by mouth daily.  ? fluticasone (FLONASE) 50 MCG/ACT nasal spray Place 2 sprays into both nostrils daily.  ? gabapentin (NEURONTIN) 300 MG capsule Take 1 capsule (300 mg total) by mouth 2 (two) times daily.  ? hydrochlorothiazide (HYDRODIURIL) 25 MG tablet Take 1 tablet (25 mg total) by mouth daily.  ? hydrOXYzine (VISTARIL) 25 MG capsule TAKE 1 CAPSULE BY MOUTH THREE TIMES DAILY AS NEEDED FOR ANXIETY  ? Lancets (ONETOUCH DELICA PLUS [id number]) MISC CHECK  BLOOD SUGAR ONCE DAILY.  ? meloxicam (MOBIC) 15 MG tablet Take 15 mg by mouth daily.  ? metFORMIN (GLUCOPHAGE) 500 MG tablet Take 1 tablet (500 mg total) by mouth 2 (two) times daily with a meal.  ? ONETOUCH ULTRA test strip   ? pravastatin (PRAVACHOL) 40 MG tablet Take 1 tablet (40 mg total) by mouth daily.  ? sertraline (ZOLOFT) 100 MG tablet Take 1 tablet (100 mg total) by mouth daily.  ? Suvorexant (BELSOMRA) 10 MG TABS Take 1 TABLET (10 mg) by mouth at bedtime.  ? tiZANidine (ZANAFLEX) 4 MG tablet Take 1 tablet (4 mg total) by mouth at bedtime as needed for muscle spasms.  ? ?No facility-administered encounter medications on file as of 05/22/2021.  ? ? ?Patient Active Problem List  ? Diagnosis Date Noted  ? 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([place]) 78/29/5621  ? Aortic atherosclerosis ([place]) 04/18/2020  ? Colon cancer screening   ? Osteopenia 07/23/2018  ? Chronic low back pain 06/03/2018  ? Insomnia 12/09/2017  ? Anxiety 04/19/2017  ? Hypertension associated with diabetes ([place]) 11/13/2016  ? Hyperlipidemia associated with type 2 diabetes mellitus ([place]) 11/13/2016  ? 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 ? ?Conditions to be addressed/monitored:HTN, HLD, DMII, and Chronic pain and discomfort  ? ?Care Plan : RNCM: General Plan of Care (Adult) for Chronic Disease Management and Care Coordination Needs  ?Updates made by Vanita Ingles, RN since 05/22/2021 12:00 AM  ?  ? ?Problem: RNCM: Development of Planc of Care for Chronic Disease Management (HTN, HLD, DM, Chronic Pain)   ?Priority: High  ?  ? ?  Long-Range Goal: RNCM: Effective Management  of Planc of Care for Chronic Disease Management (HTN, HLD, DM, Chronic Pain)   ?Start Date: 12/01/2020  ?Expected End Date: 12/01/2021  ?Priority: High  ?Note:   ?Current Barriers:  ?Knowledge Deficits related to plan of care for management of HTN, HLD, DMII, and chronic pain   ?Chronic Disease Management support and education needs related to HTN, HLD, DMII,  and chronic pain  ? ?RNCM Clinical Goal(s):  ?Patient will verbalize understanding of plan for management of HTN, HLD, DMII, and Osteoarthritis as evidenced by keeping appointments, following plan of care, and working with the CCM team to effectively manage health and well being  ?take all medications exactly as prescribed and will call provider for medication related questions as evidenced by compliance with medications and calling for refills before running out of medications.     ?attend all scheduled medical appointments: needs to call the office to make an appointment with the pcp  as evidenced by seeing pcp on a regular basis and calling for an appointment that suits the patients schedule.        ?demonstrate improved and ongoing adherence to prescribed treatment plan for HTN, HLD, DMII, and Osteoarthritis as evidenced by stable conditions, and no acute exacerbations in conditions, compliance with medications, and dietary restrictions ?demonstrate a decrease in HTN, HLD, DMII, and Osteoarthritis exacerbations  as evidenced by no acute changes in chronic conditions and effective management of chronic condtions  ?demonstrate ongoing self health care management ability for effective management of chronic conditions  as evidenced by working with the CCM team through collaboration with Consulting civil engineer, provider, and care team.  ? ?Interventions: ?1:1 collaboration with primary care provider regarding development and update of comprehensive plan of care as evidenced by provider attestation and co-signature ?Inter-disciplinary care team collaboration (see longitudinal plan of care) ?Evaluation of current treatment plan related to  self management and patient's adherence to plan as established by provider ? ? ?SDOH Barriers (Status: Goal on Track (progressing): YES.) Long Term Goal  ?Patient interviewed and SDOH assessment performed ?       ?Patient interviewed and appropriate assessments performed ?Provided patient  with information about resources available and care guides to assist with changes with SDOH or new needs  ?Discussed plans with patient for ongoing care management follow up and provided patient with direct contact information for care management team ?Advised patient to call the office for changes in SDOH, concerns, or questions ? ? ? ?Diabetes:  (Status: Goal on Track (progressing): YES.) Long Term Goal  ? ?Lab Results  ?Component Value Date  ? HGBA1C 6.5 (H) 01/25/2021  ?Assessed patient's understanding of A1c goal: <7% ?Provided education to patient about basic DM disease process; ?Reviewed medications with patient and discussed importance of medication adherence. 05-22-2021: the patient is compliant with medications        ?Reviewed prescribed diet with patient heart healthy/ADA. 05-22-2021: The patient is compliant with heart healthy/ADA diet. She states that she sometimes sneaks and eats things she should not but for the most part she eats things that are good for her ; ?Counseled on importance of regular laboratory monitoring as prescribed. 05-22-2021: The patient has regular lab work. Last in January;        ?Discussed plans with patient for ongoing care management follow up and provided patient with direct contact information for care management team;      ?Provided patient with written educational materials related to hypo and hyperglycemia and  importance of correct treatment. 12-01-2020: Education on the patient monitoring for elevations in blood sugars due to steroid shots in bilateral hips yesterday.   05-22-2021: The patient states that she is stable with her blood sugars. Denies any real lows or highs. Did have a reading recently of 16 and she called the pharmacy and ask about it. They told her that was good. Review of sx and sx of hypoglycemia and what to look for.     ?Reviewed scheduled/upcoming provider appointments including:06-06-2021 at 340 pm  ?Advised patient, providing education and rationale, to  check cbg as directed  and record. 05-22-2021: The patient checks periodically. Denies any issues with blood sugars.        ?call provider for findings outside established parameters;       ?Review of patie

## 2021-05-22 NOTE — Patient Instructions (Signed)
Visit Information ? ?Thank you for taking time to visit with me today. Please don't hesitate to contact me if I can be of assistance to you before our next scheduled telephone appointment. ? ?Following are the goals we discussed today:  ?RNCM Clinical Goal(s):  ?Patient will verbalize understanding of plan for management of HTN, HLD, DMII, and Osteoarthritis as evidenced by keeping appointments, following plan of care, and working with the CCM team to effectively manage health and well being  ?take all medications exactly as prescribed and will call provider for medication related questions as evidenced by compliance with medications and calling for refills before running out of medications.     ?attend all scheduled medical appointments: needs to call the office to make an appointment with the pcp  as evidenced by seeing pcp on a regular basis and calling for an appointment that suits the patients schedule.        ?demonstrate improved and ongoing adherence to prescribed treatment plan for HTN, HLD, DMII, and Osteoarthritis as evidenced by stable conditions, and no acute exacerbations in conditions, compliance with medications, and dietary restrictions ?demonstrate a decrease in HTN, HLD, DMII, and Osteoarthritis exacerbations  as evidenced by no acute changes in chronic conditions and effective management of chronic condtions  ?demonstrate ongoing self health care management ability for effective management of chronic conditions  as evidenced by working with the CCM team through collaboration with Consulting civil engineer, provider, and care team.  ?  ?Interventions: ?1:1 collaboration with primary care provider regarding development and update of comprehensive plan of care as evidenced by provider attestation and co-signature ?Inter-disciplinary care team collaboration (see longitudinal plan of care) ?Evaluation of current treatment plan related to  self management and patient's adherence to plan as established by  provider ?  ?  ?SDOH Barriers (Status: Goal on Track (progressing): YES.) Long Term Goal  ?Patient interviewed and SDOH assessment performed ?       ?Patient interviewed and appropriate assessments performed ?Provided patient with information about resources available and care guides to assist with changes with SDOH or new needs  ?Discussed plans with patient for ongoing care management follow up and provided patient with direct contact information for care management team ?Advised patient to call the office for changes in SDOH, concerns, or questions ?  ?  ?  ?Diabetes:  (Status: Goal on Track (progressing): YES.) Long Term Goal  ?  ?     ?Lab Results  ?Component Value Date  ?  HGBA1C 6.5 (H) 01/25/2021  ?Assessed patient's understanding of A1c goal: <7% ?Provided education to patient about basic DM disease process; ?Reviewed medications with patient and discussed importance of medication adherence. 05-22-2021: the patient is compliant with medications        ?Reviewed prescribed diet with patient heart healthy/ADA. 05-22-2021: The patient is compliant with heart healthy/ADA diet. She states that she sometimes sneaks and eats things she should not but for the most part she eats things that are good for her ; ?Counseled on importance of regular laboratory monitoring as prescribed. 05-22-2021: The patient has regular lab work. Last in January;        ?Discussed plans with patient for ongoing care management follow up and provided patient with direct contact information for care management team;      ?Provided patient with written educational materials related to hypo and hyperglycemia and importance of correct treatment. 12-01-2020: Education on the patient monitoring for elevations in blood sugars due to steroid shots in  bilateral hips yesterday.   05-22-2021: The patient states that she is stable with her blood sugars. Denies any real lows or highs. Did have a reading recently of 105 and she called the pharmacy and ask  about it. They told her that was good. Review of sx and sx of hypoglycemia and what to look for.     ?Reviewed scheduled/upcoming provider appointments including:06-06-2021 at 340 pm  ?Advised patient, providing education and rationale, to check cbg as directed  and record. 05-22-2021: The patient checks periodically. Denies any issues with blood sugars.        ?call provider for findings outside established parameters;       ?Review of patient status, including review of consultants reports, relevant laboratory and other test results, and medications completed;       ?Screening for signs and symptoms of depression related to chronic disease state;        ?Assessed social determinant of health barriers;    ?03-20-2021: Has regular eye exams. States that she will see the eye doctor in July     ?  ?Hyperlipidemia:  (Status: Goal on Track (progressing): YES.) Long Term Goal  ?     ?Lab Results  ?Component Value Date  ?  CHOL 163 01/25/2021  ?  HDL 34 (L) 01/25/2021  ?  [place] 79 01/25/2021  ?  TRIG 304 (H) 01/25/2021  ?  CHOLHDL 4.8 (H) 01/25/2021  ?  ?  ?Medication review performed; medication list updated in electronic medical record. 05-22-2021:The patient is taking Pravastatin 40 mg QD ?Provider established cholesterol goals reviewed. 05-22-2021: Review of cholesterol goals and dietary habits that can help with the management of cholesterol; ?Counseled on importance of regular laboratory monitoring as prescribed. 05-22-2021: The patient states that she has regular lab work.  ?Provided HLD educational materials; ?Reviewed role and benefits of statin for ASCVD risk reduction; ?Discussed strategies to manage statin-induced myalgias; ?Reviewed importance of limiting foods high in cholesterol; ?Reviewed exercise goals and target of 150 minutes per week; ?States she has an upcoming appointment on 03-27-2021 to get her ears cleaned out. She is not having issues but feels she has some wax build up and wants to be safe.  ?   ?Hypertension: (Status: Goal on Track (progressing): YES.) ?Last practice recorded BP readings:  ?   ?BP Readings from Last 3 Encounters:  ?03/27/21 108/80  ?01/25/21 135/75  ?09/27/20 127/80  ?Most recent eGFR/CrCl:  ?     ?Lab Results  ?Component Value Date  ?  EGFR 101 02/09/2021  ?  No components found for: CRCL ?  ?Evaluation of current treatment plan related to hypertension self management and patient's adherence to plan as established by provider. 03-20-2021: The patient states that her blood pressures at home are stable as well. The patient denies any acute findings related to HTN or heart health. Will continue to monitor for changes. She is happy today and is looking forward to her Rudene Anda on Thursday.  She states she can hardly believe she will be 70 years old.   05-22-2021: The patient has good blood pressure readings. Denies any acute findings today. States that she is doing well and her sons took her out to eat for her birthday.  ?Provided education to patient re: stroke prevention, s/s of heart attack and stroke; ?Reviewed prescribed diet heart healthy/ADA diet. 05-22-2021: is compliant with heart healthy/ADA diet  ?Reviewed medications with patient and discussed importance of compliance. 05-22-2021: Is compliant with medications  ?Discussed plans  with patient for ongoing care management follow up and provided patient with direct contact information for care management team; ?Advised patient, providing education and rationale, to monitor blood pressure daily and record, calling PCP for findings outside established parameters;  ?Advised patient to discuss blood pressure trends  with provider; ?Provided education on prescribed diet heart healthy ;  ?Discussed complications of poorly controlled blood pressure such as heart disease, stroke, circulatory complications, vision complications, kidney impairment, sexual dysfunction;  ?  ?Pain:  (Status: Goal on Track (progressing): YES.) Long Term Goal  ?Pain  assessment performed. 12-01-2020: The patient had injections in bilateral hips at [place] yesterday and states that she has "0" pain today. The patient states she slept all night last night and feels great today. 03-20-2021: T

## 2021-06-06 ENCOUNTER — Ambulatory Visit (INDEPENDENT_AMBULATORY_CARE_PROVIDER_SITE_OTHER): Payer: Medicare Other | Admitting: Nurse Practitioner

## 2021-06-06 ENCOUNTER — Encounter: Payer: Self-pay | Admitting: Nurse Practitioner

## 2021-06-06 VITALS — BP 118/71 | HR 72 | Temp 97.9°F | Ht 67.01 in | Wt 171.8 lb

## 2021-06-06 DIAGNOSIS — E785 Hyperlipidemia, unspecified: Secondary | ICD-10-CM | POA: Diagnosis not present

## 2021-06-06 DIAGNOSIS — E1159 Type 2 diabetes mellitus with other circulatory complications: Secondary | ICD-10-CM | POA: Diagnosis not present

## 2021-06-06 DIAGNOSIS — I152 Hypertension secondary to endocrine disorders: Secondary | ICD-10-CM

## 2021-06-06 DIAGNOSIS — F419 Anxiety disorder, unspecified: Secondary | ICD-10-CM

## 2021-06-06 DIAGNOSIS — R21 Rash and other nonspecific skin eruption: Secondary | ICD-10-CM

## 2021-06-06 DIAGNOSIS — Z0000 Encounter for general adult medical examination without abnormal findings: Secondary | ICD-10-CM | POA: Diagnosis not present

## 2021-06-06 DIAGNOSIS — I700 Atherosclerosis of aorta: Secondary | ICD-10-CM

## 2021-06-06 DIAGNOSIS — E1169 Type 2 diabetes mellitus with other specified complication: Secondary | ICD-10-CM | POA: Diagnosis not present

## 2021-06-06 LAB — URINALYSIS, ROUTINE W REFLEX MICROSCOPIC
Bilirubin, UA: NEGATIVE
Glucose, UA: NEGATIVE
Ketones, UA: NEGATIVE
Nitrite, UA: NEGATIVE
Protein,UA: NEGATIVE
RBC, UA: NEGATIVE
Specific Gravity, UA: 1.01 (ref 1.005–1.030)
Urobilinogen, Ur: 0.2 mg/dL (ref 0.2–1.0)
pH, UA: 7 (ref 5.0–7.5)

## 2021-06-06 LAB — MICROSCOPIC EXAMINATION
Bacteria, UA: NONE SEEN
RBC, Urine: NONE SEEN /hpf (ref 0–2)

## 2021-06-06 MED ORDER — GABAPENTIN 300 MG PO CAPS: 300.0000 mg | ORAL_CAPSULE | Freq: Two times a day (BID) | ORAL | 1 refills | Status: DC

## 2021-06-06 MED ORDER — PRAVASTATIN SODIUM 40 MG PO TABS: 40.0000 mg | ORAL_TABLET | Freq: Every day | ORAL | 1 refills | Status: DC

## 2021-06-06 MED ORDER — CITALOPRAM HYDROBROMIDE 10 MG PO TABS: 10.0000 mg | ORAL_TABLET | Freq: Every day | ORAL | 0 refills | Status: DC

## 2021-06-06 MED ORDER — METFORMIN HCL 500 MG PO TABS: 500.0000 mg | ORAL_TABLET | Freq: Two times a day (BID) | ORAL | 1 refills | Status: DC

## 2021-06-06 MED ORDER — CLONIDINE HCL 0.1 MG PO TABS: 0.1000 mg | ORAL_TABLET | Freq: Every day | ORAL | 1 refills | Status: DC

## 2021-06-06 MED ORDER — PREDNISONE 10 MG PO TABS: 10.0000 mg | ORAL_TABLET | Freq: Every day | ORAL | 0 refills | Status: DC

## 2021-06-06 MED ORDER — HYDROCHLOROTHIAZIDE 25 MG PO TABS: 25.0000 mg | ORAL_TABLET | Freq: Every day | ORAL | 1 refills | Status: DC

## 2021-06-06 NOTE — Progress Notes (Signed)
BP 118/71   Pulse 72   Temp 97.9 F (36.6 C) (Oral)   Ht 5' 7.01" (1.702 m)   Wt 171 lb 12.8 oz (77.9 kg)   SpO2 95%   BMI 26.90 kg/m    Subjective:    Patient ID: Marcia [name], female    DOB: [birth year], 71 y.o.   MRN: [id number]  HPI: Marcia [name] is a 71 y.o. female presenting on 06/06/2021 for comprehensive medical examination. Current medical complaints include: Rash  She currently lives with: Menopausal Symptoms: no  HYPERTENSION / HYPERLIPIDEMIA Satisfied with current treatment? yes Duration of hypertension: years BP monitoring frequency: daily BP range: 120-135/80s BP medication side effects: no Past BP meds: clonidine and HCTZ Duration of hyperlipidemia: years Cholesterol medication side effects: no Cholesterol supplements: none Past cholesterol medications: pravastatin (pravachol) Medication compliance: excellent compliance Aspirin: no Recent stressors: no Recurrent headaches: no Visual changes: no Palpitations: no Dyspnea: no Chest pain: no Lower extremity edema: no Dizzy/lightheaded: no   DIABETES Hypoglycemic episodes:no Polydipsia/polyuria: no Visual disturbance: no Chest pain: no Paresthesias: no Glucose Monitoring: yes             Accucheck frequency: Daily             Fasting glucose:124             Post prandial:             Evening:             Before meals: Taking Insulin?: no             Long acting insulin:             Short acting insulin: Blood Pressure Monitoring: daily Retinal Examination: Up to Date Foot Exam: Up to Date Diabetic Education: Not Completed Pneumovax: Up to Date Influenza: Not up to Date Aspirin: no   RASH Patient states she has a rash on her left lower leg and on her back.  The areas burn and itch.  States they have been there for about a week.  She hasn't changed any soaps, detergents or lotions.  ANXIETY Patient states the Zoloft is not working well for her.  Wondering if we can switch to something  else.  States she isn't sleeping well.   Depression Screen done today and results listed below:     06/06/2021    3:30 PM 01/25/2021    3:56 PM 10/05/2020    4:30 PM 09/27/2020    3:52 PM 05/19/2020   10:29 AM  Depression screen PHQ 2/9  Decreased Interest 1 3 0 0 0  Down, Depressed, Hopeless 0 2 0 0 0  PHQ - 2 Score 1 5 0 0 0  Altered sleeping 2 0   3  Tired, decreased energy 0 3   0  Change in appetite 0 0   0  Feeling bad or failure about yourself  0 0   0  Trouble concentrating 0 0   0  Moving slowly or fidgety/restless 0 0   0  Suicidal thoughts 0 0   0  PHQ-9 Score 3 8   3   Difficult doing work/chores Not difficult at all Not difficult at all   Not difficult at all    The patient does not have a history of falls. I did complete a risk assessment for falls. A plan of care for falls was documented.   Past Medical History:  Past Medical History:  Diagnosis Date   Anxiety  Colon cancer screening    Depression    Hyperlipidemia    Hypertension    Perforated bowel ([place]) 12/06/2017    Surgical History:  Past Surgical History:  Procedure Laterality Date   BREAST BIOPSY Left 2015   CORE W/CLIP - NEG   BREAST BIOPSY Left 07/2007   neg bx    CESAREAN SECTION     COLON SURGERY     COLONOSCOPY WITH PROPOFOL N/A 02/18/2018   Procedure: COLONOSCOPY WITH PROPOFOL;  Surgeon: Jonathon Bellows, MD;  Location: [facility] ENDOSCOPY;  Service: Gastroenterology;  Laterality: N/A;   COLONOSCOPY WITH PROPOFOL N/A 03/25/2018   Procedure: COLONOSCOPY WITH PROPOFOL;  Surgeon: Jonathon Bellows, MD;  Location: [facility] END[facility]COPY;  Service: Gastroenterology;  Laterality: N/A;   COLONOSCOPY WITH PROPOFOL N/A 08/25/2018   Procedure: COLONOSCOPY WITH PROPOFOL;  Surgeon: Lin Landsman, MD;  Location: [facility] ENDOSCOPY;  Service: Gastroenterology;  Laterality: N/A;   LAPAROTOMY N/A 12/06/2017   Procedure: EXPLORATORY LAPAROTOMY, sigmoid colectomy, anastomosis;  Surgeon: Vickie Epley, MD;  Location: [facility] ORS;   Service: General;  Laterality: N/A;   TUBAL LIGATION     XI ROBOTIC ASSISTED VENTRAL HERNIA N/A 11/26/2018   Procedure: XI ROBOTIC ASSISTED VENTRAL HERNIA;  Surgeon: Jules Husbands, MD;  Location: [facility] ORS;  Service: General;  Laterality: N/A;    Medications:  Current Outpatient Medications on File Prior to Visit  Medication Sig   Blood Glucose Monitoring Suppl (ONE TOUCH ULTRA 2) w/Device KIT AS DIRECTED.   carbamide peroxide (DEBROX) 6.5 % OTIC solution Place 5 drops into both ears 2 (two) times daily.   fluticasone (FLONASE) 50 MCG/ACT nasal spray Place 2 sprays into both nostrils daily.   hydrOXYzine (VISTARIL) 25 MG capsule TAKE 1 CAPSULE BY MOUTH THREE TIMES DAILY AS NEEDED FOR ANXIETY   Lancets (ONETOUCH DELICA PLUS [id number]) MISC CHECK BLOOD SUGAR ONCE DAILY.   meloxicam (MOBIC) 15 MG tablet Take 15 mg by mouth daily.   ONETOUCH ULTRA test strip    Suvorexant (BELSOMRA) 10 MG TABS Take 1 TABLET (10 mg) by mouth at bedtime.   tiZANidine (ZANAFLEX) 4 MG tablet Take 1 tablet (4 mg total) by mouth at bedtime as needed for muscle spasms.   No current facility-administered medications on file prior to visit.    Allergies:  No Known Allergies  Social History:  Social History   Socioeconomic History   Marital status: Divorced    Spouse name: Not on file   Number of children: Not on file   Years of education: Not on file   Highest education level: High school graduate  Occupational History   Not on file  Tobacco Use   Smoking status: Former    Types: Cigarettes    Quit date: 11/18/2012    Years since quitting: 8.5   Smokeless tobacco: Never   Tobacco comments:    quit over 15 years ago   Vaping Use   Vaping Use: Every day  Substance and Sexual Activity   Alcohol use: No   Drug use: No   Sexual activity: Not Currently  Other Topics Concern   Not on file  Social History Narrative   Goes out to eat with sisters once a month   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    Goes  to walking twice a week for 30 minutes    Social Determinants of Health   Financial Resource Strain: Low Risk    Difficulty of Paying Living Expenses: Not hard at all  Food Insecurity:  No Food Insecurity   Worried About Charity fundraiser in the Last Year: Never true   Ran Out of Food in the Last Year: Never true  Transportation Needs: No Transportation Needs   Lack of Transportation (Medical): No   Lack of Transportation (Non-Medical): No  Physical Activity: Insufficiently Active   Days of Exercise per Week: 7 days   Minutes of Exercise per Session: 10 min  Stress: No Stress Concern Present   Feeling of Stress : Not at all  Social Connections: Moderately Integrated   Frequency of Communication with Friends and Family: More than three times a week   Frequency of Social Gatherings with Friends and Family: More than three times a week   Attends Religious Services: More than 4 times per year   Active Member of [employer] or Organizations: Yes   Attends Music therapist: More than 4 times per year   Marital Status: Divorced  Human resources officer Violence: Not At Risk   Fear of Current or Ex-Partner: No   Emotionally Abused: No   Physically Abused: No   Sexually Abused: No   Social History   Tobacco Use  Smoking Status Former   Types: Cigarettes   Quit date: 11/18/2012   Years since quitting: 8.5  Smokeless Tobacco Never  Tobacco Comments   quit over 15 years ago    Social History   Substance and Sexual Activity  Alcohol Use No    Family History:  Family History  Problem Relation Age of Onset   Breast cancer Mother    Liver cancer Sister    Pancreatic cancer Sister    Stomach cancer Brother    Lung cancer Brother    Brain cancer Sister    Diabetes Father    Heart disease Father    Breast cancer Sister    Heart attack Brother    COPD Neg Hx    Stroke Neg Hx     Past medical history, surgical history, medications, allergies, family history and social  history reviewed with patient today and changes made to appropriate areas of the chart.   Review of Systems  Eyes:  Negative for blurred vision and double vision.  Respiratory:  Negative for shortness of breath.   Cardiovascular:  Negative for chest pain, palpitations and leg swelling.  Skin:  Positive for rash.  Neurological:  Negative for dizziness and headaches.  Psychiatric/Behavioral:  Positive for depression. Negative for suicidal ideas. The patient is nervous/anxious.   All other ROS negative except what is listed above and in the HPI.      Objective:    BP 118/71   Pulse 72   Temp 97.9 F (36.6 C) (Oral)   Ht 5' 7.01" (1.702 m)   Wt 171 lb 12.8 oz (77.9 kg)   SpO2 95%   BMI 26.90 kg/m   Wt Readings from Last 3 Encounters:  06/06/21 171 lb 12.8 oz (77.9 kg)  03/27/21 174 lb 6.4 oz (79.1 kg)  01/25/21 168 lb (76.2 kg)    Physical Exam Vitals and nursing note reviewed.  Constitutional:      General: She is awake. She is not in acute distress.    Appearance: She is well-developed. She is not ill-appearing.  HENT:     Head: Normocephalic and atraumatic.     Right Ear: Hearing, tympanic membrane, ear canal and external ear normal. No drainage.     Left Ear: Hearing, tympanic membrane, ear canal and external ear normal. No drainage.  Nose: Nose normal.     Right Sinus: No maxillary sinus tenderness or frontal sinus tenderness.     Left Sinus: No maxillary sinus tenderness or frontal sinus tenderness.     Mouth/Throat:     Mouth: Mucous membranes are moist.     Pharynx: Oropharynx is clear. Uvula midline. No pharyngeal swelling, oropharyngeal exudate or posterior oropharyngeal erythema.  Eyes:     General: Lids are normal.        Right eye: No discharge.        Left eye: No discharge.     Extraocular Movements: Extraocular movements intact.     Conjunctiva/sclera: Conjunctivae normal.     Pupils: Pupils are equal, round, and reactive to light.     Visual Fields:  Right eye visual fields normal and left eye visual fields normal.  Neck:     Thyroid: No thyromegaly.     Vascular: No carotid bruit.     Trachea: Trachea normal.  Cardiovascular:     Rate and Rhythm: Normal rate and regular rhythm.     Heart sounds: Normal heart sounds. No murmur heard.   No gallop.  Pulmonary:     Effort: Pulmonary effort is normal. No accessory muscle usage or respiratory distress.     Breath sounds: Normal breath sounds.  Chest:  Breasts:    Right: Normal.     Left: Normal.  Abdominal:     General: Bowel sounds are normal.     Palpations: Abdomen is soft. There is no hepatomegaly or splenomegaly.     Tenderness: There is no abdominal tenderness.  Musculoskeletal:        General: Normal range of motion.     Cervical back: Normal range of motion and neck supple.     Right lower leg: No edema.     Left lower leg: No edema.  Lymphadenopathy:     Head:     Right side of head: No submental, submandibular, tonsillar, preauricular or posterior auricular adenopathy.     Left side of head: No submental, submandibular, tonsillar, preauricular or posterior auricular adenopathy.     Cervical: No cervical adenopathy.     Upper Body:     Right upper body: No supraclavicular, axillary or pectoral adenopathy.     Left upper body: No supraclavicular, axillary or pectoral adenopathy.  Skin:    General: Skin is warm and dry.     Capillary Refill: Capillary refill takes less than 2 seconds.     Findings: No rash.       Neurological:     Mental Status: She is alert and oriented to person, place, and time.     Gait: Gait is intact.     Deep Tendon Reflexes: Reflexes are normal and symmetric.     Reflex Scores:      Brachioradialis reflexes are 2+ on the right side and 2+ on the left side.      Patellar reflexes are 2+ on the right side and 2+ on the left side. Psychiatric:        Attention and Perception: Attention normal.        Mood and Affect: Mood normal.         Speech: Speech normal.        Behavior: Behavior normal. Behavior is cooperative.        Thought Content: Thought content normal.        Judgment: Judgment normal.    Results for orders placed or performed in visit  on 06/06/21  Microscopic Examination   Urine  Result Value Ref Range   WBC, UA 0-5 0 - 5 /hpf   RBC None seen 0 - 2 /hpf   Epithelial Cells (non renal) 0-10 0 - 10 /hpf   Bacteria, UA None seen None seen/Few  HgB A1c  Result Value Ref Range   Hgb A1c MFr Bld 6.6 (H) 4.8 - 5.6 %   Est. average glucose Bld gHb Est-mCnc 143 mg/dL  CBC with Differential/Platelet  Result Value Ref Range   WBC 6.3 3.4 - 10.8 x10E3/uL   RBC 4.84 3.77 - 5.28 x10E6/uL   Hemoglobin 13.9 11.1 - 15.9 g/dL   Hematocrit 42.3 34.0 - 46.6 %   MCV 87 79 - 97 fL   MCH 28.7 26.6 - 33.0 pg   MCHC 32.9 31.5 - 35.7 g/dL   RDW 13.4 11.7 - 15.4 %   Platelets 305 150 - 450 x10E3/uL   Neutrophils 53 Not Estab. %   Lymphs 35 Not Estab. %   Monocytes 9 Not Estab. %   Eos 2 Not Estab. %   Basos 1 Not Estab. %   Neutrophils Absolute 3.3 1.4 - 7.0 x10E3/uL   Lymphocytes Absolute 2.2 0.7 - 3.1 x10E3/uL   Monocytes Absolute 0.5 0.1 - 0.9 x10E3/uL   EOS (ABSOLUTE) 0.2 0.0 - 0.4 x10E3/uL   Basophils Absolute 0.1 0.0 - 0.2 x10E3/uL   Immature Granulocytes 0 Not Estab. %   Immature Grans (Abs) 0.0 0.0 - 0.1 x10E3/uL  Comprehensive metabolic panel  Result Value Ref Range   Glucose 101 (H) 70 - 99 mg/dL   BUN 20 8 - 27 mg/dL   Creatinine, Ser 0.72 0.57 - 1.00 mg/dL   eGFR 90 >59 mL/min/1.73   BUN/Creatinine Ratio 28 12 - 28   Sodium 141 134 - 144 mmol/L   Potassium 4.1 3.5 - 5.2 mmol/L   Chloride 100 96 - 106 mmol/L   CO2 27 20 - 29 mmol/L   Calcium 9.9 8.7 - 10.3 mg/dL   Total Protein 6.6 6.0 - 8.5 g/dL   Albumin 4.5 3.8 - 4.8 g/dL   Globulin, Total 2.1 1.5 - 4.5 g/dL   Albumin/Globulin Ratio 2.1 1.2 - 2.2   Bilirubin Total 0.5 0.0 - 1.2 mg/dL   Alkaline Phosphatase 54 44 - 121 IU/L   AST 25 0 - 40  IU/L   ALT 31 0 - 32 IU/L  Lipid panel  Result Value Ref Range   Cholesterol, Total 172 100 - 199 mg/dL   Triglycerides 370 (H) 0 - 149 mg/dL   HDL 33 (L) >39 mg/dL   VLDL Cholesterol Cal 60 (H) 5 - 40 mg/dL   LDL Chol Calc (NIH) 79 0 - 99 mg/dL   Chol/HDL Ratio 5.2 (H) 0.0 - 4.4 ratio  TSH  Result Value Ref Range   TSH 3.320 0.450 - 4.500 uIU/mL  Urinalysis, Routine w reflex microscopic  Result Value Ref Range   Specific Gravity, UA 1.010 1.005 - 1.030   pH, UA 7.0 5.0 - 7.5   Color, UA Yellow Yellow   Appearance Ur Clear Clear   Leukocytes,UA Trace (A) Negative   Protein,UA Negative Negative/Trace   Glucose, UA Negative Negative   Ketones, UA Negative Negative   RBC, UA Negative Negative   Bilirubin, UA Negative Negative   Urobilinogen, Ur 0.2 0.2 - 1.0 mg/dL   Nitrite, UA Negative Negative   Microscopic Examination See below:       Assessment &  Plan:   Problem List Items Addressed This Visit       Cardiovascular and Mediastinum   Hypertension associated with diabetes ([place])    Chronic.  Controlled.  Continue with current medication regimen of Clonidine and HCTZ.  Labs ordered today.  Continue to monitor blood pressures at home.  Call if blood pressure is >140/90.  Return to clinic in 6 months for reevaluation.  Call sooner if concerns arise.         Relevant Medications   cloNIDine (CATAPRES) 0.1 MG tablet   hydrochlorothiazide (HYDRODIURIL) 25 MG tablet   metFORMIN (GLUCOPHAGE) 500 MG tablet   pravastatin (PRAVACHOL) 40 MG tablet   Other Relevant Orders   Comprehensive metabolic panel (Completed)   Aortic atherosclerosis ([facility])    Chronic.  Controlled.  Continue with current medication regimen on Pravastatin 63m.  Labs ordered today.  Refills sent.  Return to clinic in 6 months for reevaluation.  Call sooner if concerns arise.         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  Controlled.  Continue with current medication regimen of Metformin 5085mBID.  Labs ordered today. Will make further recommendations based on lab results. Continue to check blood pressures at home. If consistently elevated >140 call the office.  Refills sent today.  Return to clinic in 6 months for reevaluation.  Call sooner if concerns arise.         Relevant Medications   cloNIDine (CATAPRES) 0.1 MG tablet   hydrochlorothiazide (HYDRODIURIL) 25 MG tablet   metFORMIN (GLUCOPHAGE) 500 MG tablet   pravastatin (PRAVACHOL) 40 MG tablet   Other Relevant Orders   HgB A1c (Completed)     Endocrine   Hyperlipidemia associated with type 2 diabetes mellitus ([facility])    Chronic.  Controlled.  Continue with current medication regimen of Pravastatin 406m Refills sent today.  Labs ordered today.  Return to clinic in 6 months for reevaluation.  Call sooner if concerns arise.         Relevant Medications   cloNIDine (CATAPRES) 0.1 MG tablet   hydrochlorothiazide (HYDRODIURIL) 25 MG tablet   metFORMIN (GLUCOPHAGE) 500 MG tablet   pravastatin (PRAVACHOL) 40 MG tablet   Other Relevant Orders   Lipid panel (Completed)     Other   Anxiety    Chronic.  Not well controlled.  Will change from Zoloft to Celexa 67m23mily.  Side effects and benefits of medications discussed during visit today.  Labs ordered at visit today.  Return to clinic in 1 month for reevaluation.  Call sooner if concerns arise.        Relevant Medications   citalopram (CELEXA) 10 MG tablet   Other Visit Diagnoses     Annual physical exam    -  Primary   Health maintenance reviewed during visit today. Labs ordered at visit today.  Up to date on vaccines.    Relevant Orders   HgB A1c (Completed)   CBC with Differential/Platelet (Completed)   Lipid panel (Completed)   TSH (Completed)   Urinalysis, Routine w reflex  microscopic (Completed)   Rash       Complete course of steroids. Can use mupirocin cream to help with symptoms. Follow up if symptoms do not improve.        Follow up  plan: Return in about 1 month (around 07/07/2021) for Depression/Anxiety FU.   LABORATORY TESTING:  - Pap smear: not applicable  IMMUNIZATIONS:   - Tdap: Tetanus vaccination status reviewed: last tetanus booster within 10 years. - Influenza: Up to date - Pneumovax: Up to date - Prevnar: Up to date - COVID: Up to date - HPV: Not applicable - Shingrix vaccine: Up to date  SCREENING: -Mammogram: Up to date  - Colonoscopy: Up to date  - Bone Density: Not applicable  -Hearing Test: Not applicable  -Spirometry: Not applicable   PATIENT COUNSELING:   Advised to take 1 mg of folate supplement per day if capable of pregnancy.   Sexuality: Discussed sexually transmitted diseases, partner selection, use of condoms, avoidance of unintended pregnancy  and contraceptive alternatives.   Advised to avoid cigarette smoking.  I discussed with the patient that most people either abstain from alcohol or drink within safe limits (<=14/week and <=4 drinks/occasion for males, <=7/weeks and <= 3 drinks/occasion for females) and that the risk for alcohol disorders and other health effects rises proportionally with the number of drinks per week and how often a drinker exceeds daily limits.  Discussed cessation/primary prevention of drug use and availability of treatment for abuse.   Diet: Encouraged to adjust caloric intake to maintain  or achieve ideal body weight, to reduce intake of dietary saturated fat and total fat, to limit sodium intake by avoiding high sodium foods and not adding table salt, and to maintain adequate dietary potassium and calcium preferably from fresh fruits, vegetables, and low-fat dairy products.    stressed the importance of regular exercise  Injury prevention: Discussed safety belts, safety helmets, smoke  detector, smoking near bedding or upholstery.   Dental health: Discussed importance of regular tooth brushing, flossing, and dental visits.    NEXT PREVENTATIVE PHYSICAL DUE IN 1 YEAR. Return in about 1 month (around 07/07/2021) for Depression/Anxiety FU.

## 2021-06-06 NOTE — Assessment & Plan Note (Signed)
Chronic.  Controlled.  Continue with current medication regimen of Clonidine and HCTZ.  Labs ordered today.  Continue to monitor blood pressures at home.  Call if blood pressure is >140/90.  Return to clinic in 6 months for reevaluation.  Call sooner if concerns arise.  ? ?

## 2021-06-06 NOTE — Assessment & Plan Note (Signed)
Chronic.  Controlled.  Continue with current medication regimen on Pravastatin 40mg.  Labs ordered today.  Refills sent.  Return to clinic in 6 months for reevaluation.  Call sooner if concerns arise.   

## 2021-06-07 ENCOUNTER — Telehealth: Payer: Self-pay | Admitting: Nurse Practitioner

## 2021-06-07 LAB — COMPREHENSIVE METABOLIC PANEL
ALT: 31 IU/L (ref 0–32)
AST: 25 IU/L (ref 0–40)
Albumin/Globulin Ratio: 2.1 (ref 1.2–2.2)
Albumin: 4.5 g/dL (ref 3.8–4.8)
Alkaline Phosphatase: 54 IU/L (ref 44–121)
BUN/Creatinine Ratio: 28 (ref 12–28)
BUN: 20 mg/dL (ref 8–27)
Bilirubin Total: 0.5 mg/dL (ref 0.0–1.2)
CO2: 27 mmol/L (ref 20–29)
Calcium: 9.9 mg/dL (ref 8.7–10.3)
Chloride: 100 mmol/L (ref 96–106)
Creatinine, Ser: 0.72 mg/dL (ref 0.57–1.00)
Globulin, Total: 2.1 g/dL (ref 1.5–4.5)
Glucose: 101 mg/dL — ABNORMAL HIGH (ref 70–99)
Potassium: 4.1 mmol/L (ref 3.5–5.2)
Sodium: 141 mmol/L (ref 134–144)
Total Protein: 6.6 g/dL (ref 6.0–8.5)
eGFR: 90 mL/min/{1.73_m2} (ref >59)

## 2021-06-07 LAB — CBC WITH DIFFERENTIAL/PLATELET
Basophils Absolute: 0.1 10*3/uL (ref 0.0–0.2)
Basos: 1 %
EOS (ABSOLUTE): 0.2 10*3/uL (ref 0.0–0.4)
Eos: 2 %
Hematocrit: 42.3 % (ref 34.0–46.6)
Hemoglobin: 13.9 g/dL (ref 11.1–15.9)
Immature Grans (Abs): 0 10*3/uL (ref 0.0–0.1)
Immature Granulocytes: 0 %
Lymphocytes Absolute: 2.2 10*3/uL (ref 0.7–3.1)
Lymphs: 35 %
MCH: 28.7 pg (ref 26.6–33.0)
MCHC: 32.9 g/dL (ref 31.5–35.7)
MCV: 87 fL (ref 79–97)
Monocytes Absolute: 0.5 10*3/uL (ref 0.1–0.9)
Monocytes: 9 %
Neutrophils Absolute: 3.3 10*3/uL (ref 1.4–7.0)
Neutrophils: 53 %
Platelets: 305 10*3/uL (ref 150–450)
RBC: 4.84 x10E6/uL (ref 3.77–5.28)
RDW: 13.4 % (ref 11.7–15.4)
WBC: 6.3 10*3/uL (ref 3.4–10.8)

## 2021-06-07 LAB — LIPID PANEL
Chol/HDL Ratio: 5.2 ratio — ABNORMAL HIGH (ref 0.0–4.4)
Cholesterol, Total: 172 mg/dL (ref 100–199)
HDL: 33 mg/dL — ABNORMAL LOW (ref >39)
LDL Chol Calc (NIH): 79 mg/dL (ref 0–99)
Triglycerides: 370 mg/dL — ABNORMAL HIGH (ref 0–149)
VLDL Cholesterol Cal: 60 mg/dL — ABNORMAL HIGH (ref 5–40)

## 2021-06-07 LAB — HEMOGLOBIN A1C
Est. average glucose Bld gHb Est-mCnc: 143 mg/dL
Hgb A1c MFr Bld: 6.6 % — ABNORMAL HIGH (ref 4.8–5.6)

## 2021-06-07 LAB — TSH: TSH: 3.32 u[IU]/mL (ref 0.450–4.500)

## 2021-06-07 NOTE — Assessment & Plan Note (Signed)
Chronic.  Not well controlled.  Will change from Zoloft to Celexa 10mg  daily.  Side effects and benefits of medications discussed during visit today.  Labs ordered at visit today.  Return to clinic in 1 month for reevaluation.  Call sooner if concerns arise.

## 2021-06-07 NOTE — Telephone Encounter (Signed)
See result note. Patient was given results this morning.

## 2021-06-07 NOTE — Assessment & Plan Note (Addendum)
Chronic.  Controlled.  Continue with current medication regimen of Metformin 500mg  BID.  Labs ordered today. Will make further recommendations based on lab results. Continue to check blood pressures at home. If consistently elevated >140 call the office.  Refills sent today.  Return to clinic in 6 months for reevaluation.  Call sooner if concerns arise.

## 2021-06-07 NOTE — Progress Notes (Signed)
Please let patient know that her lab work looks good.  Her A1c is in the diabetic range.  She is trended up from 6.0 this time last year to 6.6.  I recommend she decrease her carbohydrate intake to make sure this doesn't continue to increase. Her cholesterol shows that her triglycerides are elevated.  I recommend decreasing processed food and refined sugar intake. Otherwise her blood work looks good.  No other concerns at this time.

## 2021-06-07 NOTE — Assessment & Plan Note (Signed)
Chronic.  Controlled.  Continue with current medication regimen of Pravastatin 40mg.  Refills sent today.  Labs ordered today.  Return to clinic in 6 months for reevaluation.  Call sooner if concerns arise.   

## 2021-06-07 NOTE — Telephone Encounter (Signed)
Copied from CRM [id number]. Topic: General - Other >> Jun 07, 2021  8:52 AM Marcia [name] wrote: Reason for CRM: pt called in for her lab results.   Please assist pt further.

## 2021-06-13 ENCOUNTER — Other Ambulatory Visit: Payer: Self-pay | Admitting: Nurse Practitioner

## 2021-06-13 NOTE — Telephone Encounter (Signed)
Pt stated she has 3 strips left and is calling back requesting an update.     Please advise.

## 2021-06-14 NOTE — Telephone Encounter (Signed)
Pt has called and has no strips left and as of 8:40 states that pharmacy has not received any scripts for her strips. [phone]

## 2021-06-19 ENCOUNTER — Ambulatory Visit: Payer: Self-pay

## 2021-06-19 NOTE — Telephone Encounter (Signed)
    Chief Complaint: Abdominal pain over the weekend, gone now. Has black stools. Symptoms: Black stools Frequency: Saturday Pertinent Negatives: Patient denies any pain today Disposition: [] ED /[] Urgent Care (no appt availability in office) / [x] Appointment(In office/virtual)/ []  [place] [facility]/ [] Home Care/ [] Refused Recommended Disposition /[] [place] [facility]/ []  Follow-up with PCP Additional Notes: Instructed to call back for worsening of symptoms.  Reason for Disposition  [1] MILD pain (e.g., does not interfere with normal activities) AND [2] pain comes and goes (cramps) AND [3] present > 48 hours  (Exception: this same abdominal pain is a chronic symptom recurrent or ongoing AND present > 4 weeks)  Answer Assessment - Initial Assessment Questions 1. LOCATION: "Where does it hurt?"      No pain today 2. RADIATION: "Does the pain shoot anywhere else?" (e.g., chest, back)     No 3. ONSET: "When did the pain begin?" (e.g., minutes, hours or days ago)      Saturday 4. SUDDEN: "Gradual or sudden onset?"     Sudden 5. PATTERN "Does the pain come and go, or is it constant?"    - If constant: "Is it getting better, staying the same, or worsening?"      (Note: Constant means the pain never goes away completely; most serious pain is constant and it progresses)     - If intermittent: "How long does it last?" "Do you have pain now?"     (Note: Intermittent means the pain goes away completely between bouts)     Gone now 6. SEVERITY: "How bad is the pain?"  (e.g., Scale 1-10; mild, moderate, or severe)   - MILD (1-3): doesn't interfere with normal activities, abdomen soft and not tender to touch    - MODERATE (4-7): interferes with normal activities or awakens from sleep, abdomen tender to touch    - SEVERE (8-10): excruciating pain, doubled over, unable to do any normal activities      None 7. RECURRENT SYMPTOM: "Have you ever had this type of stomach pain before?" If Yes, ask:  "When was the last time?" and "What happened that time?"      No 8. CAUSE: "What do you think is causing the stomach pain?"     Unsure 9. RELIEVING/AGGRAVATING FACTORS: "What makes it better or worse?" (e.g., movement, antacids, bowel movement)     No 10. OTHER SYMPTOMS: "Do you have any other symptoms?" (e.g., back pain, diarrhea, fever, urination pain, vomiting)       Black stools 11. PREGNANCY: "Is there any chance you are pregnant?" "When was your last menstrual period?"       No  Protocols used: Abdominal Pain - [facility]

## 2021-06-20 ENCOUNTER — Ambulatory Visit (INDEPENDENT_AMBULATORY_CARE_PROVIDER_SITE_OTHER): Payer: Medicare Other | Admitting: Nurse Practitioner

## 2021-06-20 ENCOUNTER — Encounter: Payer: Self-pay | Admitting: Nurse Practitioner

## 2021-06-20 VITALS — HR 85 | Temp 98.5°F | Wt 170.2 lb

## 2021-06-20 DIAGNOSIS — I10 Essential (primary) hypertension: Secondary | ICD-10-CM

## 2021-06-20 DIAGNOSIS — E1169 Type 2 diabetes mellitus with other specified complication: Secondary | ICD-10-CM

## 2021-06-20 DIAGNOSIS — K921 Melena: Secondary | ICD-10-CM | POA: Diagnosis not present

## 2021-06-20 DIAGNOSIS — Z7984 Long term (current) use of oral hypoglycemic drugs: Secondary | ICD-10-CM

## 2021-06-20 DIAGNOSIS — E785 Hyperlipidemia, unspecified: Secondary | ICD-10-CM

## 2021-06-20 LAB — CBC WITH DIFFERENTIAL/PLATELET
Hematocrit: 40 % (ref 34.0–46.6)
Hemoglobin: 13.7 g/dL (ref 11.1–15.9)
Lymphocytes Absolute: 2.9 10*3/uL (ref 0.7–3.1)
Lymphs: 32 %
MCH: 29.2 pg (ref 26.6–33.0)
MCHC: 34.3 g/dL (ref 31.5–35.7)
MCV: 85 fL (ref 79–97)
MID (Absolute): 1 10*3/uL (ref 0.1–1.6)
MID: 11 %
Neutrophils Absolute: 5 10*3/uL (ref 1.4–7.0)
Neutrophils: 57 %
Platelets: 305 10*3/uL (ref 150–450)
RBC: 4.69 x10E6/uL (ref 3.77–5.28)
RDW: 14.4 % (ref 11.7–15.4)
WBC: 8.9 10*3/uL (ref 3.4–10.8)

## 2021-06-20 NOTE — Progress Notes (Signed)
Called and gave results to patient over the phone.

## 2021-06-20 NOTE — Assessment & Plan Note (Signed)
Ongoing x 3 days. CBC in office showed h/h of 13.7 and 40.  Due to patient being asymptomatic likely related to something she ate.  Symptoms are improving.  Will order IFOB and stool culture to rule out blood and infection.  Will make recommendations based on results.  May need to see GI.

## 2021-06-20 NOTE — Progress Notes (Signed)
Pulse 85   Temp 98.5 F (36.9 C) (Oral)   Wt 170 lb 3.2 oz (77.2 kg)   SpO2 97%   BMI 26.65 kg/m    Subjective:    Patient ID: Marcia [name], female    DOB: 1950-09-29, 71 y.o.   MRN: [id number]  HPI: Marcia [name] is a 71 y.o. female  Chief Complaint  Patient presents with   Melena    Black stools x 3 days s/p eating some grapes and cheese. Pt states the stools are not that dark as the days have gone by    Patient states she has been having black stools for about 3 days.  She had a pimento cheese sandwich and grapes on Saturday and has stomach pains and 20 minutes later she had a large bowel movement that was black like tar.  She had another one before bed which was also black.  Sunday had another one that was black.  Monday the stools improved and today they improved also.  She feels fine.  Denies stomach pain, fever, fatigue, SOB, blood in stool, blood in urine, cold intolerance.   Relevant past medical, surgical, family and social history reviewed and updated as indicated. Interim medical history since our last visit reviewed. Allergies and medications reviewed and updated.  Review of Systems  Constitutional:  Negative for fatigue and fever.  Respiratory:  Negative for shortness of breath.   Gastrointestinal:  Negative for abdominal pain, blood in stool and diarrhea.  Endocrine: Negative for cold intolerance.   Per HPI unless specifically indicated above     Objective:    Pulse 85   Temp 98.5 F (36.9 C) (Oral)   Wt 170 lb 3.2 oz (77.2 kg)   SpO2 97%   BMI 26.65 kg/m   Wt Readings from Last 3 Encounters:  06/20/21 170 lb 3.2 oz (77.2 kg)  06/06/21 171 lb 12.8 oz (77.9 kg)  03/27/21 174 lb 6.4 oz (79.1 kg)    Physical Exam Vitals and nursing note reviewed.  Constitutional:      General: She is not in acute distress.    Appearance: Normal appearance. She is normal weight. She is not ill-appearing, toxic-appearing or diaphoretic.  HENT:     Head:  Normocephalic.     Right Ear: External ear normal.     Left Ear: External ear normal.     Nose: Nose normal.     Mouth/Throat:     Mouth: Mucous membranes are moist.     Pharynx: Oropharynx is clear.  Eyes:     General:        Right eye: No discharge.        Left eye: No discharge.     Extraocular Movements: Extraocular movements intact.     Conjunctiva/sclera: Conjunctivae normal.     Pupils: Pupils are equal, round, and reactive to light.  Cardiovascular:     Rate and Rhythm: Normal rate and regular rhythm.     Heart sounds: No murmur heard. Pulmonary:     Effort: Pulmonary effort is normal. No respiratory distress.     Breath sounds: Normal breath sounds. No wheezing or rales.  Abdominal:     General: Abdomen is flat. Bowel sounds are normal. There is no distension.     Palpations: Abdomen is soft.     Tenderness: There is no abdominal tenderness. There is no right CVA tenderness, left CVA tenderness or guarding.  Musculoskeletal:     Cervical back: Normal range of  motion and neck supple.  Skin:    General: Skin is warm and dry.     Capillary Refill: Capillary refill takes less than 2 seconds.  Neurological:     General: No focal deficit present.     Mental Status: She is alert and oriented to person, place, and time. Mental status is at baseline.  Psychiatric:        Mood and Affect: Mood normal.        Behavior: Behavior normal.        Thought Content: Thought content normal.        Judgment: Judgment normal.    Results for orders placed or performed in visit on 06/20/21  CBC With Differential/Platelet (STAT)  Result Value Ref Range   WBC 8.9 3.4 - 10.8 x10E3/uL   RBC 4.69 3.77 - 5.28 x10E6/uL   Hemoglobin 13.7 11.1 - 15.9 g/dL   Hematocrit 40.0 34.0 - 46.6 %   MCV 85 79 - 97 fL   MCH 29.2 26.6 - 33.0 pg   MCHC 34.3 31.5 - 35.7 g/dL   RDW 14.4 11.7 - 15.4 %   Platelets 305 150 - 450 x10E3/uL   Neutrophils 57 Not Estab. %   Lymphs 32 Not Estab. %   MID 11 Not  Estab. %   Neutrophils Absolute 5.0 1.4 - 7.0 x10E3/uL   Lymphocytes Absolute 2.9 0.7 - 3.1 x10E3/uL   MID (Absolute) 1.0 0.1 - 1.6 X10E3/uL      Assessment & Plan:   Problem List Items Addressed This Visit       Other   Black stools - Primary    Ongoing x 3 days. CBC in office showed h/h of 13.7 and 40.  Due to patient being asymptomatic likely related to something she ate.  Symptoms are improving.  Will order IFOB and stool culture to rule out blood and infection.  Will make recommendations based on results.  May need to see GI.        Relevant Orders   CBC With Differential/Platelet (STAT) (Completed)   Fecal occult blood, imunochemical   Stool Culture     Follow up plan: Return if symptoms worsen or fail to improve.

## 2021-06-22 DIAGNOSIS — K921 Melena: Secondary | ICD-10-CM | POA: Diagnosis not present

## 2021-06-25 ENCOUNTER — Telehealth: Payer: Self-pay | Admitting: Nurse Practitioner

## 2021-06-25 NOTE — Telephone Encounter (Signed)
Copied from CRM [id number]. Topic: General - Other >> Jun 25, 2021  9:07 AM Glean Salen wrote: Pt called in to go over labs, only shows read right now.

## 2021-06-25 NOTE — Progress Notes (Signed)
Please let patient know that so far her stool test is negative.  If something changes I will let her know.

## 2021-06-25 NOTE — Telephone Encounter (Signed)
Patient aware of lab results, no further questions.  

## 2021-06-27 LAB — STOOL CULTURE: E coli, Shiga toxin Assay: NEGATIVE

## 2021-06-27 LAB — FECAL OCCULT BLOOD, IMMUNOCHEMICAL: Fecal Occult Bld: NEGATIVE

## 2021-07-04 ENCOUNTER — Ambulatory Visit: Payer: Self-pay

## 2021-07-04 NOTE — Telephone Encounter (Signed)
  Chief Complaint: Back pain  starting yesterday Symptoms: lower back pain Frequency: yesterday Pertinent Negatives: Patient denies Fever, pain with urination , loss of bladder or bowel control Disposition: [] ED /[] Urgent Care (no appt availability in office) / [] Appointment(In office/virtual)/ []  [place] [facility]/ [x] Home Care/ [] Refused Recommended Disposition /[] [place] [facility]/ []  Follow-up with PCP Additional Notes: Pt is having lower back pain since yesterday. Pt will try ice & IBU. PT will call back if needed. Summary: hurt lower back   Pt called in hurt lower back and asking for med t be sent in to [facility] - [place], [place] - [address]  Phone: [phone]  Fax:   [phone].       She didn't want an appt or speak with NT.      Reason for Disposition  Caused by a twisting, bending, or lifting injury  Answer Assessment - Initial Assessment Questions 1. ONSET: "When did the pain begin?"      Yesterday 2. LOCATION: "Where does it hurt?" (upper, mid or lower back)     Lower back 3. SEVERITY: "How bad is the pain?"  (e.g., Scale 1-10; mild, moderate, or severe)   - MILD (1-3): doesn't interfere with normal activities    - MODERATE (4-7): interferes with normal activities or awakens from sleep    - SEVERE (8-10): excruciating pain, unable to do any normal activities      7/10 4. PATTERN: "Is the pain constant?" (e.g., yes, no; constant, intermittent)      Comes and goes 5. RADIATION: "Does the pain shoot into your legs or elsewhere?"     no 6. CAUSE:  "What do you think is causing the back pain?"      Unsure - Bent wrong 7. BACK OVERUSE:  "Any recent lifting of heavy objects, strenuous work or exercise?"     no 8. MEDICATIONS: "What have you taken so far for the pain?" (e.g., nothing, acetaminophen, NSAIDS)     no 9. NEUROLOGIC SYMPTOMS: "Do you have any weakness, numbness, or problems with bowel/bladder control?"     no 10. OTHER SYMPTOMS: "Do  you have any other symptoms?" (e.g., fever, abdominal pain, burning with urination, blood in urine)       no 11. PREGNANCY: "Is there any chance you are pregnant?" (e.g., yes, no; LMP)       na  Protocols used: Back Pain-A-AH

## 2021-07-04 NOTE — Telephone Encounter (Signed)
FYI to provider

## 2021-07-04 NOTE — Telephone Encounter (Signed)
Patient will have to have an appt. 

## 2021-07-04 NOTE — Telephone Encounter (Signed)
Pt called in hurt lower back and asking for med t be sent in to [facility] - [place], [place] - [address]  Phone: [phone]  Fax:   [phone].       She didn't want an appt or speak with NT.   Left message to call back about symptoms.

## 2021-07-06 ENCOUNTER — Telehealth: Payer: Self-pay | Admitting: Nurse Practitioner

## 2021-07-06 NOTE — Telephone Encounter (Signed)
Patient called to update the provider that the medication trial for citalopram 10 mg is working good. Patient states she received a call from the office to reschedule her appointment. She would like to continue the medication if that is ok with the provider. She will be out of the medicine on Saturday June 17th.  Please assist patient further

## 2021-07-09 ENCOUNTER — Ambulatory Visit: Payer: Medicare Other | Admitting: Nurse Practitioner

## 2021-07-09 ENCOUNTER — Ambulatory Visit: Payer: Self-pay

## 2021-07-09 MED ORDER — CITALOPRAM HYDROBROMIDE 10 MG PO TABS: 10.0000 mg | ORAL_TABLET | Freq: Every day | ORAL | 0 refills | Status: DC

## 2021-07-09 NOTE — Telephone Encounter (Signed)
Routing to provider. Patient requesting to have refill on medication.

## 2021-07-09 NOTE — Telephone Encounter (Signed)
See other phone encounter.  

## 2021-07-09 NOTE — Telephone Encounter (Signed)
    Pt. States Citalopram "really helped me and I need a refill. They cancelled my follow up appointment with her today." Please advise pt.  Answer Assessment - Initial Assessment Questions 1. DRUG NAME: "What medicine do you need to have refilled?"     Citalopram  2. REFILLS REMAINING: "How many refills are remaining?" (Note: The label on the medicine or pill bottle will show how many refills are remaining. If there are no refills remaining, then a renewal may be needed.)     0 3. EXPIRATION DATE: "What is the expiration date?" (Note: The label states when the prescription will expire, and thus can no longer be refilled.)     N/a 4. PRESCRIBING HCP: "Who prescribed it?" Reason: If prescribed by specialist, call should be referred to that group.     [place] 5. SYMPTOMS: "Do you have any symptoms?"     N/a 6. PREGNANCY: "Is there any chance that you are pregnant?" "When was your last menstrual period?"     N/a  Protocols used: Medication Refill and Renewal Call-A-AH

## 2021-07-09 NOTE — Telephone Encounter (Signed)
Attemped to call patient to inform her of Marcia [name]'s message regarding rx. LVMTRC.   OK for PEC/Nurse triage to give information if pt calls back.

## 2021-07-10 ENCOUNTER — Other Ambulatory Visit: Payer: Self-pay | Admitting: Nurse Practitioner

## 2021-07-10 NOTE — Telephone Encounter (Signed)
Requested medications are due for refill today.  unsure  Requested medications are on the active medications list.  no  Last refill. 01/25/2021 #90 1 refill  Future visit scheduled.   yes  Notes to clinic.  Medication is not on med list. Medication was discontinued 06/06/2021.    Requested Prescriptions  Pending Prescriptions Disp Refills   sertraline (ZOLOFT) 100 MG tablet [Pharmacy Med Name: SERTRALINE HCL 100 MG TABLET] 90 tablet 0    Sig: Take 1 tablet (100 mg total) by mouth daily.     Psychiatry:  Antidepressants - SSRI - sertraline Passed - 07/10/2021 10:39 AM      Passed - AST in normal range and within 360 days    AST  Date Value Ref Range Status  06/06/2021 25 0 - 40 IU/L Final         Passed - ALT in normal range and within 360 days    ALT  Date Value Ref Range Status  06/06/2021 31 0 - 32 IU/L Final         Passed - Completed PHQ-2 or PHQ-9 in the last 360 days      Passed - Valid encounter within last 6 months    Recent Outpatient Visits           2 weeks ago Black stools   [facility] Larae Grooms, NP   1 month ago Annual physical exam   [facility] Larae Grooms, NP   3 months ago Sensation of fullness in both ears   [facility] Vigg, Avanti, MD   5 months ago 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[facility])   [facility] Larae Grooms, NP   9 months ago 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[facility])   [facility] Larae Grooms, NP       Future Appointments             In 2 weeks Larae Grooms, NP [facility], [facility]

## 2021-07-16 ENCOUNTER — Other Ambulatory Visit: Payer: Self-pay | Admitting: Nurse Practitioner

## 2021-07-30 ENCOUNTER — Ambulatory Visit: Payer: Medicare Other | Admitting: Nurse Practitioner

## 2021-07-31 ENCOUNTER — Telehealth: Payer: Self-pay

## 2021-07-31 ENCOUNTER — Telehealth: Payer: Medicare Other

## 2021-07-31 NOTE — Telephone Encounter (Signed)
  Care Management   Follow Up Note   07/31/2021 Name: Marcia [name] MRN: [id number] DOB: 04/22/1950   Referred by: Jon Billings, NP Reason for referral : Chronic Care Management (RNCM: Follow up/Case Closure for Chronic Disease Management and Care Coordination Needs )   An unsuccessful telephone outreach was attempted today. The patient was referred to the case management team for assistance with care management and care coordination. Goals of care have been met and the care plan has been closed.  Follow Up Plan: A HIPPA compliant phone message was left for the patient providing contact information and requesting a return call. No further follow up required. The patient  has met the goals of care.   Noreene Larsson RN, MSN, [facility] Mobile: [phone]

## 2021-08-01 ENCOUNTER — Other Ambulatory Visit: Payer: Self-pay | Admitting: Nurse Practitioner

## 2021-08-01 NOTE — Telephone Encounter (Signed)
Requested Prescriptions  Pending Prescriptions Disp Refills  . Lancets (ONETOUCH DELICA PLUS LANCET33G) MISC [Pharmacy Med Name: ONETOUCH DELICA PLUS 33G LANCT] 100 each 0    Sig: CHECK BLOOD SUGAR ONCE DAILY.     Endocrinology: Diabetes - Testing Supplies Passed - 08/01/2021  8:58 AM      Passed - Valid encounter within last 12 months    Recent Outpatient Visits          1 month ago Black stools   [facility] Larae Grooms, NP   1 month ago Annual physical exam   [facility] Larae Grooms, NP   4 months ago Sensation of fullness in both ears   [facility] Vigg, Avanti, MD   6 months ago 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[facility])   [facility] Larae Grooms, NP   10 months ago 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[facility])   [facility] Larae Grooms, NP      Future Appointments            In 1 week Larae Grooms, NP [facility], [facility]

## 2021-08-02 ENCOUNTER — Other Ambulatory Visit: Payer: Self-pay | Admitting: Nurse Practitioner

## 2021-08-02 NOTE — Telephone Encounter (Signed)
Requested Prescriptions  Pending Prescriptions Disp Refills  . ONETOUCH ULTRA test strip [employer] Med Name: ONETOUCH ULTRA BLUE TEST STRP] 50 strip 0    Sig: CHECK BLOOD SUGAR ONCE DAILY.     Endocrinology: Diabetes - Testing Supplies Passed - 08/02/2021  2:43 PM      Passed - Valid encounter within last 12 months    Recent Outpatient Visits          1 month ago Black stools   [facility] Larae Grooms, NP   1 month ago Annual physical exam   [facility] Larae Grooms, NP   4 months ago Sensation of fullness in both ears   [facility] Vigg, Avanti, MD   6 months ago 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[facility])   [facility] Larae Grooms, NP   10 months ago 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[facility])   [facility] Larae Grooms, NP      Future Appointments            In 1 week Larae Grooms, NP [facility], [facility]

## 2021-08-06 ENCOUNTER — Other Ambulatory Visit: Payer: Self-pay | Admitting: Nurse Practitioner

## 2021-08-07 NOTE — Telephone Encounter (Signed)
Requested medications are due for refill today.  yes  Requested medications are on the active medications list.  yes  Last refill. 05/16/2021 #30 2 refills  Future visit scheduled.   yes  Notes to clinic.  No protocol for refill. Please review.    Requested Prescriptions  Pending Prescriptions Disp Refills   BELSOMRA 10 MG TABS [Pharmacy Med Name: BELSOMRA 10 MG TABLET] 30 tablet 0    Sig: Take 1 TABLET (10 mg) by mouth at bedtime.     Off-Protocol Failed - 08/06/2021  8:36 AM      Failed - Medication not assigned to a protocol, review manually.      Passed - Valid encounter within last 12 months    Recent Outpatient Visits           1 month ago Black stools   [facility] Larae Grooms, NP   2 months ago Annual physical exam   [facility] Larae Grooms, NP   4 months ago Sensation of fullness in both ears   [facility] Vigg, Avanti, MD   6 months ago 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[facility])   [facility] Larae Grooms, NP   10 months ago 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[facility])   [facility] Larae Grooms, NP       Future Appointments             In 2 days Larae Grooms, NP [facility], [facility]

## 2021-08-09 ENCOUNTER — Ambulatory Visit: Payer: Medicare Other | Admitting: Nurse Practitioner

## 2021-08-09 NOTE — Progress Notes (Deleted)
   There were no vitals taken for this visit.   Subjective:    Patient ID: Marcia [name], female    DOB: Oct 25, 1950, 71 y.o.   MRN: [id number]  HPI: Marcia [name] is a 71 y.o. female  No chief complaint on file.  MOOD  Flowsheet Row Office Visit from 06/20/2021 in [facility]  PHQ-9 Total Score 2         06/20/2021    3:57 PM 06/06/2021    3:31 PM 01/25/2021    3:57 PM 05/19/2020   10:30 AM  GAD 7 : Generalized Anxiety Score  Nervous, Anxious, on Edge 1 2 0 0  Control/stop worrying 1 0 0 0  Worry too much - different things 1 0 0 0  Trouble relaxing 1 2 0 0  Restless 0 0 0 0  Easily annoyed or irritable 0 0 0 0  Afraid - awful might happen 0 0 0 0  Total GAD 7 Score 4 4 0 0  Anxiety Difficulty Not difficult at all Not difficult at all Not difficult at all Not difficult at all      Relevant past medical, surgical, family and social history reviewed and updated as indicated. Interim medical history since our last visit reviewed. Allergies and medications reviewed and updated.  Review of Systems  Per HPI unless specifically indicated above     Objective:    There were no vitals taken for this visit.  Wt Readings from Last 3 Encounters:  06/20/21 170 lb 3.2 oz (77.2 kg)  06/06/21 171 lb 12.8 oz (77.9 kg)  03/27/21 174 lb 6.4 oz (79.1 kg)    Physical Exam  Results for orders placed or performed in visit on 06/20/21  Fecal occult blood, imunochemical   Specimen: Stool   ST     CD- [id number] V  Result Value Ref Range   Fecal Occult Bld Negative Negative  Stool Culture   Specimen: Stool   ST     CD- [id number] V  Result Value Ref Range   Salmonella/Shigella Screen Final report    Stool Culture result 1 (RSASHR) Comment    Campylobacter Culture Final report    Stool Culture result 1 (CMPCXR) Comment    E coli, Shiga toxin Assay Negative Negative  CBC With Differential/Platelet (STAT)  Result Value Ref Range   WBC 8.9 3.4 - 10.8 x10E3/uL   RBC  4.69 3.77 - 5.28 x10E6/uL   Hemoglobin 13.7 11.1 - 15.9 g/dL   Hematocrit 24.2 35.3 - 46.6 %   MCV 85 79 - 97 fL   MCH 29.2 26.6 - 33.0 pg   MCHC 34.3 31.5 - 35.7 g/dL   RDW 61.4 43.1 - 54.0 %   Platelets 305 150 - 450 x10E3/uL   Neutrophils 57 Not Estab. %   Lymphs 32 Not Estab. %   MID 11 Not Estab. %   Neutrophils Absolute 5.0 1.4 - 7.0 x10E3/uL   Lymphocytes Absolute 2.9 0.7 - 3.1 x10E3/uL   MID (Absolute) 1.0 0.1 - 1.6 X10E3/uL      Assessment & Plan:   Problem List Items Addressed This Visit       Other   Anxiety - Primary     Follow up plan: No follow-ups on file.

## 2021-08-13 ENCOUNTER — Ambulatory Visit: Payer: Medicare Other | Admitting: Physician Assistant

## 2021-08-15 ENCOUNTER — Encounter: Payer: Self-pay | Admitting: Nurse Practitioner

## 2021-08-15 ENCOUNTER — Ambulatory Visit (INDEPENDENT_AMBULATORY_CARE_PROVIDER_SITE_OTHER): Payer: Medicare Other | Admitting: Nurse Practitioner

## 2021-08-15 VITALS — BP 148/79 | HR 97 | Temp 98.4°F | Wt 171.2 lb

## 2021-08-15 DIAGNOSIS — R232 Flushing: Secondary | ICD-10-CM

## 2021-08-15 DIAGNOSIS — F419 Anxiety disorder, unspecified: Secondary | ICD-10-CM

## 2021-08-15 MED ORDER — TRIAMCINOLONE ACETONIDE 0.1 % EX CREA: 1.0000 | TOPICAL_CREAM | Freq: Two times a day (BID) | CUTANEOUS | 0 refills | Status: DC

## 2021-08-15 MED ORDER — GABAPENTIN 100 MG PO CAPS: 100.0000 mg | ORAL_CAPSULE | Freq: Every day | ORAL | 0 refills | Status: DC

## 2021-08-15 MED ORDER — CITALOPRAM HYDROBROMIDE 20 MG PO TABS
20.0000 mg | ORAL_TABLET | Freq: Every day | ORAL | 0 refills | Status: DC
Start: 2021-08-15 — End: 2021-11-08

## 2021-08-15 NOTE — Progress Notes (Signed)
BP (!) 148/79   Pulse 97   Temp 98.4 F (36.9 C) (Oral)   Wt 171 lb 3.2 oz (77.7 kg)   SpO2 96%   BMI 26.81 kg/m    Subjective:    Patient ID: Marcia [name], female    DOB: Nov 13, 1950, 71 y.o.   MRN: [id number]  HPI: Marcia [name] is a 71 y.o. female  Chief Complaint  Patient presents with   Medication Management    Patient states the medication she was put on for her anxiety is working.    Rash    Patient reports she still has problems with the rash on LLE.   Diabetic Eye Exam    Patient has upcoming eye appt 09/20/21 at [facility].    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   She feels like the Celexa is working well for her.  She feels like she would benefit from increasing the dose.    Flowsheet Row Office Visit from 08/15/2021 in [facility]  PHQ-9 Total Score 0         08/15/2021    4:12 PM 06/20/2021    3:57 PM 06/06/2021    3:31 PM 01/25/2021    3:57 PM  GAD 7 : Generalized Anxiety Score  Nervous, Anxious, on Edge 2 1 2  0  Control/stop worrying 1 1 0 0  Worry too much - different things 1 1 0 0  Trouble relaxing 1 1 2  0  Restless 1 0 0 0  Easily annoyed or irritable 1 0 0 0  Afraid - awful might happen 1 0 0 0  Total GAD 7 Score 8 4 4  0  Anxiety Difficulty Not difficult at all Not difficult at all Not difficult at all Not difficult at all   Patient states she has been having hot flashes during the day.  She takes the Gabapentin at night which helps with her symptoms. The Gabapentin makes her sleepy some.    Has a few places from her most recent rash that are itching as they are healing.     Relevant past medical, surgical, family and social history reviewed and updated as indicated. Interim medical history since our last visit reviewed. Allergies and medications reviewed and updated.  Review of Systems  Endocrine: Positive for heat intolerance.  Psychiatric/Behavioral:   Positive for dysphoric mood. Negative for suicidal ideas. The patient is nervous/anxious.     Per HPI unless specifically indicated above     Objective:    BP (!) 148/79   Pulse 97   Temp 98.4 F (36.9 C) (Oral)   Wt 171 lb 3.2 oz (77.7 kg)   SpO2 96%   BMI 26.81 kg/m   Wt Readings from Last 3 Encounters:  08/15/21 171 lb 3.2 oz (77.7 kg)  06/20/21 170 lb 3.2 oz (77.2 kg)  06/06/21 171 lb 12.8 oz (77.9 kg)    Physical Exam Vitals and nursing note reviewed.  Constitutional:      General: She is not in acute distress.    Appearance: Normal appearance. She is normal weight. She is not ill-appearing, toxic-appearing or diaphoretic.  HENT:     Head: Normocephalic.     Right Ear: External ear normal.     Left Ear: External ear normal.     Nose: Nose normal.     Mouth/Throat:     Mouth: Mucous membranes are moist.  Pharynx: Oropharynx is clear.  Eyes:     General:        Right eye: No discharge.        Left eye: No discharge.     Extraocular Movements: Extraocular movements intact.     Conjunctiva/sclera: Conjunctivae normal.     Pupils: Pupils are equal, round, and reactive to light.  Cardiovascular:     Rate and Rhythm: Normal rate and regular rhythm.     Heart sounds: No murmur heard. Pulmonary:     Effort: Pulmonary effort is normal. No respiratory distress.     Breath sounds: Normal breath sounds. No wheezing or rales.  Musculoskeletal:     Cervical back: Normal range of motion and neck supple.  Skin:    General: Skin is warm and dry.     Capillary Refill: Capillary refill takes less than 2 seconds.  Neurological:     General: No focal deficit present.     Mental Status: She is alert and oriented to person, place, and time. Mental status is at baseline.  Psychiatric:        Mood and Affect: Mood normal.        Behavior: Behavior normal.        Thought Content: Thought content normal.        Judgment: Judgment normal.     Results for orders placed or  performed in visit on 06/20/21  Fecal occult blood, imunochemical   Specimen: Stool   ST     CD- [id number] V  Result Value Ref Range   Fecal Occult Bld Negative Negative  Stool Culture   Specimen: Stool   ST     CD- [id number] V  Result Value Ref Range   Salmonella/Shigella Screen Final report    Stool Culture result 1 (RSASHR) Comment    Campylobacter Culture Final report    Stool Culture result 1 (CMPCXR) Comment    E coli, Shiga toxin Assay Negative Negative  CBC With Differential/Platelet (STAT)  Result Value Ref Range   WBC 8.9 3.4 - 10.8 x10E3/uL   RBC 4.69 3.77 - 5.28 x10E6/uL   Hemoglobin 13.7 11.1 - 15.9 g/dL   Hematocrit 29.4 76.5 - 46.6 %   MCV 85 79 - 97 fL   MCH 29.2 26.6 - 33.0 pg   MCHC 34.3 31.5 - 35.7 g/dL   RDW 46.5 03.5 - 46.5 %   Platelets 305 150 - 450 x10E3/uL   Neutrophils 57 Not Estab. %   Lymphs 32 Not Estab. %   MID 11 Not Estab. %   Neutrophils Absolute 5.0 1.4 - 7.0 x10E3/uL   Lymphocytes Absolute 2.9 0.7 - 3.1 x10E3/uL   MID (Absolute) 1.0 0.1 - 1.6 X10E3/uL      Assessment & Plan:   Problem List Items Addressed This Visit       Other   Anxiety - Primary    Chronic. Improved from prior visit. Will increase Celexa to 20mg  daily.  Follow up in 3 months for reevaluation.  Call sooner if concerns arise.       Relevant Medications   citalopram (CELEXA) 20 MG tablet   Other Visit Diagnoses     Hot flashes       Will start Gabapentin 100mg  daily to help with hot flashes. Continue Gabapentin 300mg  nightly.  Follow up if symptoms do not improve.        Follow up plan: Return in about 3 months (around 11/15/2021) for HTN, HLD, DM2 FU.

## 2021-08-15 NOTE — Assessment & Plan Note (Addendum)
Chronic. Improved from prior visit. Will increase Celexa to 20mg  daily.  Follow up in 3 months for reevaluation.  Call sooner if concerns arise.

## 2021-08-21 DIAGNOSIS — M7062 Trochanteric bursitis, left hip: Secondary | ICD-10-CM | POA: Diagnosis not present

## 2021-08-21 DIAGNOSIS — M7061 Trochanteric bursitis, right hip: Secondary | ICD-10-CM | POA: Diagnosis not present

## 2021-09-06 ENCOUNTER — Telehealth: Payer: Self-pay | Admitting: Nurse Practitioner

## 2021-09-07 NOTE — Telephone Encounter (Signed)
Pharmacy called to request refill for Belsorma 10 MG.

## 2021-09-07 NOTE — Telephone Encounter (Signed)
Pt checking status of refill request  Pt states she takes her last tablet tomorrow  Please assist further

## 2021-09-07 NOTE — Telephone Encounter (Signed)
Requested medication (s) are due for refill today - yes  Requested medication (s) are on the active medication list -yes  Future visit scheduled -yes  Last refill: 08/08/21 #30  Notes to clinic: medication not assigned protocol- provider review   Requested Prescriptions  Pending Prescriptions Disp Refills   BELSOMRA 10 MG TABS [Pharmacy Med Name: BELSOMRA 10 MG TABLET] 30 tablet 0    Sig: Take 1 TABLET (10 mg) by mouth at bedtime.     Off-Protocol Failed - 09/06/2021 11:03 AM      Failed - Medication not assigned to a protocol, review manually.      Passed - Valid encounter within last 12 months    Recent Outpatient Visits           3 weeks ago Anxiety   [facility] Larae Grooms, NP   2 months ago Black stools   [facility] Larae Grooms, NP   3 months ago Annual physical exam   [facility] Larae Grooms, NP   5 months ago Sensation of fullness in both ears   [facility] Vigg, Avanti, MD   7 months ago 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[facility])   [facility] Larae Grooms, NP       Future Appointments             In 2 months Larae Grooms, NP [facility]   In 4 months Larae Grooms, NP [facility]               Requested Prescriptions  Pending Prescriptions Disp Refills   BELSOMRA 10 MG TABS [Pharmacy Med Name: BELSOMRA 10 MG TABLET] 30 tablet 0    Sig: Take 1 TABLET (10 mg) by mouth at bedtime.     Off-Protocol Failed - 09/06/2021 11:03 AM      Failed - Medication not assigned to a protocol, review manually.      Passed - Valid encounter within last 12 months    Recent Outpatient Visits           3 weeks ago Anxiety   [facility] Larae Grooms, NP   2 months ago Black stools   [facility] Larae Grooms, NP   3 months ago Annual physical exam   [facility]  Larae Grooms, NP   5 months ago Sensation of fullness in both ears   [facility] Vigg, Avanti, MD   7 months ago 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[facility])   [facility] Larae Grooms, NP       Future Appointments             In 2 months Larae Grooms, NP [facility]   In 4 months Larae Grooms, NP [facility]

## 2021-09-10 NOTE — Telephone Encounter (Signed)
Pt is calling to report that she is completely out of this medication she took her last pill Saturday afternoon. Pt was advised that Clydie Braun was out of the office. And to get another script patient would need to see another provider. Pt reports that she does not have another day off to come in. Pt reports that she has been on this medication a long.  Cb- [phone]  [facility]

## 2021-09-10 NOTE — Telephone Encounter (Signed)
Called and notified patient that RX was sent in for her.

## 2021-10-10 ENCOUNTER — Other Ambulatory Visit: Payer: Self-pay | Admitting: Nurse Practitioner

## 2021-10-10 NOTE — Telephone Encounter (Signed)
Med refill request for Belsomra #30 0RF, last seen in office 08/15/2021. Scheduled next office visit for 11/08/2021. Please advise.

## 2021-10-10 NOTE — Telephone Encounter (Signed)
Requested medication (s) are due for refill today - yes  Requested medication (s) are on the active medication list -yes  Future visit scheduled -yes  Last refill: 09/10/21 #30  Notes to clinic: medication not assigned protocol- provider review   Requested Prescriptions  Pending Prescriptions Disp Refills   [place] 10 MG TABS [Pharmacy Med Name: BELSOMRA 10 MG TABLET] 30 tablet 0    Sig: Take 1 TABLET (10 mg) by mouth at bedtime.     Off-Protocol Failed - 10/10/2021  8:37 AM      Failed - Medication not assigned to a protocol, review manually.      Passed - Valid encounter within last 12 months    Recent Outpatient Visits           1 month ago [place], NP   3 months ago Black stools   [place], NP   4 months ago Annual physical exam   [facility] Jon Billings, NP   6 months ago Sensation of fullness in both ears   [place], MD   8 months ago 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[facility])   [place], Karen, NP       Future Appointments             In 4 weeks Jon Billings, NP [facility], [place]   In 3 months Jon Billings, NP [place], [place]               Requested Prescriptions  Pending Prescriptions Disp Refills   [place] 10 MG TABS [Pharmacy Med Name: BELSOMRA 10 MG TABLET] 30 tablet 0    Sig: Take 1 TABLET (10 mg) by mouth at bedtime.     Off-Protocol Failed - 10/10/2021  8:37 AM      Failed - Medication not assigned to a protocol, review manually.      Passed - Valid encounter within last 12 months    Recent Outpatient Visits           1 month ago [place], NP   3 months ago Black stools   [facility] Jon Billings, NP   4 months ago Annual physical exam   [facility]  Jon Billings, NP   6 months ago Sensation of fullness in both ears   [place], MD   8 months ago 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[facility])   [facility] Jon Billings, NP       Future Appointments             In 4 weeks Jon Billings, NP [facility], [place]   In 3 months Jon Billings, NP [facility], [place]

## 2021-10-19 ENCOUNTER — Ambulatory Visit: Payer: Self-pay

## 2021-10-19 NOTE — Telephone Encounter (Signed)
   Chief Complaint: Right foot, middle toe pain and swelling. No known injury. Declines appointment today. Symptoms:  Above Frequency: Yesterday Pertinent Negatives: Patient denies Redness Disposition: [] ED /[] Urgent Care (no appt availability in office) / [x] Appointment(In office/virtual)/ []  [place] [facility]/ [] Home Care/ [] Refused Recommended Disposition /[] [place] [facility]/ []  Follow-up with PCP Additional Notes: Go to UC for worsening of symptoms.  Reason for Disposition  [1] MODERATE pain (e.g., interferes with normal activities, limping) AND [2] present > 3 days  Answer Assessment - Initial Assessment Questions 1. ONSET: "When did the pain start?"      Yesterday 2. LOCATION: "Where is the pain located?"      Right middle toe 3. PAIN: "How bad is the pain?"    (Scale 1-10; or mild, moderate, severe)  - MILD (1-3): doesn't interfere with normal activities.   - MODERATE (4-7): interferes with normal activities (e.g., work or school) or awakens from sleep, limping.   - SEVERE (8-10): excruciating pain, unable to do any normal activities, unable to walk.      Moderate 4. WORK OR EXERCISE: "Has there been any recent work or exercise that involved this part of the body?"      nO 5. CAUSE: "What do you think is causing the foot pain?"     Unsure 6. OTHER SYMPTOMS: "Do you have any other symptoms?" (e.g., leg pain, rash, fever, numbness)     No 7. PREGNANCY: "Is there any chance you are pregnant?" "When was your last menstrual period?"     No  Protocols used: Foot Pain-A-[facility]

## 2021-10-22 ENCOUNTER — Encounter: Payer: Self-pay | Admitting: Physician Assistant

## 2021-10-22 ENCOUNTER — Ambulatory Visit (INDEPENDENT_AMBULATORY_CARE_PROVIDER_SITE_OTHER): Payer: Medicare Other | Admitting: Physician Assistant

## 2021-10-22 VITALS — BP 131/71 | HR 111 | Temp 98.5°F | Ht 67.01 in | Wt 170.5 lb

## 2021-10-22 DIAGNOSIS — M25571 Pain in right ankle and joints of right foot: Secondary | ICD-10-CM

## 2021-10-22 MED ORDER — COLCHICINE 0.6 MG PO TABS: 0.6000 mg | ORAL_TABLET | Freq: Every day | ORAL | 0 refills | Status: DC

## 2021-10-22 NOTE — Progress Notes (Signed)
Acute Office Visit   Patient: Marcia [name]   DOB: [birth year]   71 y.o. Female  MRN: [id number] Visit Date: 10/22/2021  Today's healthcare provider: Dani Gobble Safire Gordin, PA-C  Introduced myself to the patient as a Journalist, newspaper and provided education on APPs in clinical practice.    Chief Complaint  Patient presents with   Toe Pain    Right  middle toe pain, started about 3 to 4 days ago. Having a sharp pain with some tingling.    Subjective    HPI HPI     Toe Pain    Additional comments: Right  middle toe pain, started about 3 to 4 days ago. Having a sharp pain with some tingling.       Last edited by Almon Register, PA-C on 10/23/2021  1:08 PM.        Right middle toe pain Onset: sudden Duration: Started a few days ago Alleviating: Nothing  Aggravating: Curling toes, placing pressure on it, Dorsiflexion  Interventions: She has taken Ibuprofen She denies trauma or injury to the toe She reports she can't wear closed toe shoes due to pain and discomfort  She thinks it may be swollen     Medications: Outpatient Medications Prior to Visit  Medication Sig   BELSOMRA 10 MG TABS Take 1 TABLET (10 mg) by mouth at bedtime.   Blood Glucose Monitoring Suppl (ONE TOUCH ULTRA 2) w/Device KIT AS DIRECTED.   citalopram (CELEXA) 20 MG tablet Take 1 tablet (20 mg total) by mouth daily.   cloNIDine (CATAPRES) 0.1 MG tablet Take 1 tablet (0.1 mg total) by mouth daily.   fluticasone (FLONASE) 50 MCG/ACT nasal spray Place 2 sprays into both nostrils daily.   gabapentin (NEURONTIN) 100 MG capsule Take 1 capsule (100 mg total) by mouth daily.   gabapentin (NEURONTIN) 300 MG capsule Take 1 capsule (300 mg total) by mouth 2 (two) times daily.   hydrochlorothiazide (HYDRODIURIL) 25 MG tablet Take 1 tablet (25 mg total) by mouth daily.   hydrOXYzine (VISTARIL) 25 MG capsule TAKE 1 CAPSULE BY MOUTH THREE TIMES DAILY AS NEEDED FOR ANXIETY   Lancets (ONETOUCH DELICA PLUS [id number]) MISC CHECK  BLOOD SUGAR ONCE DAILY.   meloxicam (MOBIC) 15 MG tablet Take 15 mg by mouth daily.   metFORMIN (GLUCOPHAGE) 500 MG tablet Take 1 tablet (500 mg total) by mouth 2 (two) times daily with a meal.   ONETOUCH ULTRA test strip CHECK BLOOD SUGAR ONCE DAILY.   pravastatin (PRAVACHOL) 40 MG tablet Take 1 tablet (40 mg total) by mouth daily.   tiZANidine (ZANAFLEX) 4 MG tablet Take 1 tablet (4 mg total) by mouth at bedtime as needed for muscle spasms.   triamcinolone cream (KENALOG) 0.1 % Apply 1 Application topically 2 (two) times daily.   carbamide peroxide (DEBROX) 6.5 % OTIC solution Place 5 drops into both ears 2 (two) times daily. (Patient not taking: Reported on 10/22/2021)   No facility-administered medications prior to visit.    Review of Systems  Musculoskeletal:  Positive for arthralgias.       Pain of right middle toe        Objective    BP 131/71   Pulse (!) 111   Temp 98.5 F (36.9 C) (Oral)   Ht 5' 7.01" (1.702 m)   Wt 170 lb 8 oz (77.3 kg)   SpO2 94%   BMI 26.70 kg/m    Physical Exam Vitals reviewed.  Constitutional:      General: She is awake.     Appearance: Normal appearance. She is well-developed, well-groomed and normal weight.  HENT:     Head: Normocephalic and atraumatic.  Musculoskeletal:     Right foot: Normal range of motion. Tenderness and bony tenderness present. No swelling, deformity, laceration or crepitus.     Left foot: Normal range of motion. No swelling, deformity or tenderness.       Feet:  Feet:     Right foot:     Skin integrity: Skin breakdown, callus and dry skin present.     Toenail Condition: Right toenails are abnormally thick and long. Fungal disease present.    Left foot:     Toenail Condition: Left toenails are abnormally thick and long. Fungal disease present. Neurological:     Mental Status: She is alert.  Psychiatric:        Behavior: Behavior is cooperative.       Results for orders placed or performed in visit on  10/22/21  Comp Met (CMET)  Result Value Ref Range   Glucose 111 (H) 70 - 99 mg/dL   BUN 14 8 - 27 mg/dL   Creatinine, Ser 0.84 0.57 - 1.00 mg/dL   eGFR 75 >59 mL/min/1.73   BUN/Creatinine Ratio 17 12 - 28   Sodium 143 134 - 144 mmol/L   Potassium 3.9 3.5 - 5.2 mmol/L   Chloride 100 96 - 106 mmol/L   CO2 24 20 - 29 mmol/L   Calcium 10.1 8.7 - 10.3 mg/dL   Total Protein 6.9 6.0 - 8.5 g/dL   Albumin 4.6 3.9 - 4.9 g/dL   Globulin, Total 2.3 1.5 - 4.5 g/dL   Albumin/Globulin Ratio 2.0 1.2 - 2.2   Bilirubin Total 0.2 0.0 - 1.2 mg/dL   Alkaline Phosphatase 63 44 - 121 IU/L   AST 25 0 - 40 IU/L   ALT 28 0 - 32 IU/L  Uric acid  Result Value Ref Range   Uric Acid WILL FOLLOW     Assessment & Plan      No follow-ups on file.     Problem List Items Addressed This Visit   None Visit Diagnoses     Arthralgia of toe of right foot    -  Primary Acute, new concern Reports she has had pain in middle toe of right foot for a few days- states it is painful to wear closed shoes, to bend or wiggle toe at all Suspect gout based on patient's symptoms and lack of injury or trauma Will check CMP, uric acid levels  Recommend trial of Colchicine to assist with management  If pain persists will order imaging to rule out acute injury  Follow up as needed for persistent or progressing symptoms     Relevant Medications   colchicine 0.6 MG tablet   Other Relevant Orders   Comp Met (CMET) (Completed)   Uric acid (Completed)        No follow-ups on file.   I, Payal Stanforth E Javar Eshbach, PA-C, have reviewed all documentation for this visit. The documentation on 10/23/21 for the exam, diagnosis, procedures, and orders are all accurate and complete.   Talitha Givens, MHS, PA-C [facility]

## 2021-10-23 LAB — COMPREHENSIVE METABOLIC PANEL
ALT: 28 IU/L (ref 0–32)
AST: 25 IU/L (ref 0–40)
Albumin/Globulin Ratio: 2 (ref 1.2–2.2)
Albumin: 4.6 g/dL (ref 3.9–4.9)
Alkaline Phosphatase: 63 IU/L (ref 44–121)
BUN/Creatinine Ratio: 17 (ref 12–28)
BUN: 14 mg/dL (ref 8–27)
Bilirubin Total: 0.2 mg/dL (ref 0.0–1.2)
CO2: 24 mmol/L (ref 20–29)
Calcium: 10.1 mg/dL (ref 8.7–10.3)
Chloride: 100 mmol/L (ref 96–106)
Creatinine, Ser: 0.84 mg/dL (ref 0.57–1.00)
Globulin, Total: 2.3 g/dL (ref 1.5–4.5)
Glucose: 111 mg/dL — ABNORMAL HIGH (ref 70–99)
Potassium: 3.9 mmol/L (ref 3.5–5.2)
Sodium: 143 mmol/L (ref 134–144)
Total Protein: 6.9 g/dL (ref 6.0–8.5)
eGFR: 75 mL/min/{1.73_m2} (ref >59)

## 2021-10-23 LAB — URIC ACID: Uric Acid: 5.1 mg/dL (ref 3.0–7.2)

## 2021-10-24 ENCOUNTER — Telehealth: Payer: Self-pay | Admitting: Nurse Practitioner

## 2021-10-24 NOTE — Telephone Encounter (Signed)
Patient made aware of results and verbalized understanding.  

## 2021-10-24 NOTE — Progress Notes (Signed)
Labs are overall normal and stable at this time. Uric acid levels are within normal range. If desired she can try using the colchicine that was prescribed on Monday or she can use Ibuprofen and tylenol for pain. If this persists over the next 1-2 weeks we can send her for imaging to rule out bony injury.

## 2021-10-24 NOTE — Telephone Encounter (Signed)
Copied from [place] [phone]. Topic: General - Other >> Oct 23, 2021  4:41 PM Oley Balm E wrote: Reason for CRM: Pt called wanting to discuss labs from gout in her toe, pt wants to discuss this with office. Please advise Best contact: [phone]

## 2021-10-26 DIAGNOSIS — M25551 Pain in right hip: Secondary | ICD-10-CM | POA: Diagnosis not present

## 2021-10-26 DIAGNOSIS — M25552 Pain in left hip: Secondary | ICD-10-CM | POA: Diagnosis not present

## 2021-10-29 ENCOUNTER — Telehealth: Payer: Self-pay | Admitting: Nurse Practitioner

## 2021-10-29 NOTE — Telephone Encounter (Signed)
Pt got a letter that she has been called for jury duty on Nov. 27.  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 Pt will bring a copy of the letter by the office, b/c she doesn't have it in front of her now to give the juror information.

## 2021-11-08 ENCOUNTER — Encounter: Payer: Self-pay | Admitting: Nurse Practitioner

## 2021-11-08 ENCOUNTER — Ambulatory Visit (INDEPENDENT_AMBULATORY_CARE_PROVIDER_SITE_OTHER): Payer: Medicare Other | Admitting: Nurse Practitioner

## 2021-11-08 VITALS — BP 124/75 | HR 103 | Temp 98.6°F | Wt 170.9 lb

## 2021-11-08 DIAGNOSIS — E1169 Type 2 diabetes mellitus with other specified complication: Secondary | ICD-10-CM | POA: Diagnosis not present

## 2021-11-08 DIAGNOSIS — E785 Hyperlipidemia, unspecified: Secondary | ICD-10-CM | POA: Diagnosis not present

## 2021-11-08 DIAGNOSIS — I152 Hypertension secondary to endocrine disorders: Secondary | ICD-10-CM

## 2021-11-08 DIAGNOSIS — E1159 Type 2 diabetes mellitus with other circulatory complications: Secondary | ICD-10-CM

## 2021-11-08 DIAGNOSIS — Z7189 Other specified counseling: Secondary | ICD-10-CM

## 2021-11-08 DIAGNOSIS — I700 Atherosclerosis of aorta: Secondary | ICD-10-CM | POA: Diagnosis not present

## 2021-11-08 DIAGNOSIS — Z0000 Encounter for general adult medical examination without abnormal findings: Secondary | ICD-10-CM | POA: Diagnosis not present

## 2021-11-08 DIAGNOSIS — F419 Anxiety disorder, unspecified: Secondary | ICD-10-CM

## 2021-11-08 LAB — MICROALBUMIN, URINE WAIVED
Creatinine, Urine Waived: 50 mg/dL (ref 10–300)
Microalb, Ur Waived: 10 mg/L (ref 0–19)
Microalb/Creat Ratio: <30 mg/g (ref <30)

## 2021-11-08 MED ORDER — HYDROXYZINE PAMOATE 25 MG PO CAPS: ORAL_CAPSULE | ORAL | 2 refills | Status: DC

## 2021-11-08 MED ORDER — GABAPENTIN 300 MG PO CAPS: 300.0000 mg | ORAL_CAPSULE | Freq: Two times a day (BID) | ORAL | 1 refills | Status: DC

## 2021-11-08 MED ORDER — CLONIDINE HCL 0.1 MG PO TABS: 0.1000 mg | ORAL_TABLET | Freq: Every day | ORAL | 1 refills | Status: DC

## 2021-11-08 MED ORDER — GABAPENTIN 100 MG PO CAPS: 100.0000 mg | ORAL_CAPSULE | Freq: Every day | ORAL | 1 refills | Status: DC

## 2021-11-08 MED ORDER — METFORMIN HCL 500 MG PO TABS: 500.0000 mg | ORAL_TABLET | Freq: Two times a day (BID) | ORAL | 1 refills | Status: DC

## 2021-11-08 MED ORDER — CITALOPRAM HYDROBROMIDE 20 MG PO TABS: 20.0000 mg | ORAL_TABLET | Freq: Every day | ORAL | 1 refills | Status: DC

## 2021-11-08 MED ORDER — HYDROCHLOROTHIAZIDE 25 MG PO TABS: 25.0000 mg | ORAL_TABLET | Freq: Every day | ORAL | 1 refills | Status: DC

## 2021-11-08 MED ORDER — PRAVASTATIN SODIUM 40 MG PO TABS: 40.0000 mg | ORAL_TABLET | Freq: Every day | ORAL | 1 refills | Status: DC

## 2021-11-08 NOTE — Progress Notes (Signed)
BP 124/75   Pulse (!) 103   Temp 98.6 F (37 C) (Oral)   Wt 170 lb 14.4 oz (77.5 kg)   SpO2 95%   BMI 26.76 kg/m    Subjective:    Patient ID: Marcia [name], female    DOB: 06/08/1950, 71 y.o.   MRN: [id number]  HPI: Marcia [name] is a 71 y.o. female presenting on 11/08/2021 for comprehensive medical examination. Current medical complaints include: vaccines  She currently lives with: Menopausal Symptoms: no  HYPERTENSION / HYPERLIPIDEMIA Satisfied with current treatment? yes Duration of hypertension: years BP monitoring frequency: daily BP range: 120-135/80s BP medication side effects: no Past BP meds: clonidine and HCTZ Duration of hyperlipidemia: years Cholesterol medication side effects: no Cholesterol supplements: none Past cholesterol medications: pravastatin (pravachol) Medication compliance: excellent compliance Aspirin: no Recent stressors: no Recurrent headaches: no Visual changes: no Palpitations: no Dyspnea: no Chest pain: no Lower extremity edema: no Dizzy/lightheaded: no   DIABETES Hypoglycemic episodes:no Polydipsia/polyuria: no Visual disturbance: no Chest pain: no Paresthesias: no Glucose Monitoring: yes             Accucheck frequency: Daily             Fasting glucose: 120-170             Post prandial:             Evening:             Before meals: Taking Insulin?: no             Long acting insulin:             Short acting insulin: Blood Pressure Monitoring: daily Retinal Examination: Up to Date- has an appt in November Foot Exam: Up to Date Diabetic Education: Not Completed Pneumovax: Up to Date Influenza: up to date Aspirin: no    Functional Status Survey:       11/08/2021    3:56 PM 08/15/2021    4:12 PM 06/20/2021    3:57 PM 06/06/2021    3:30 PM 01/25/2021    3:56 PM  [place] in the past year? 0 0 0 0 0  Number falls in past yr: 0 0 0 0 0  Injury with Fall? 0 0 0 0 0  Risk for fall due to : No Fall  Risks No Fall Risks No Fall Risks No Fall Risks No Fall Risks  Follow up Falls evaluation completed Falls evaluation completed Falls evaluation completed Falls evaluation completed Falls evaluation completed    Depression Screen    11/08/2021    3:56 PM 08/15/2021    4:12 PM 06/20/2021    3:57 PM 06/06/2021    3:30 PM 01/25/2021    3:56 PM  Depression screen PHQ 2/9  Decreased Interest 0 0 0 1 3  Down, Depressed, Hopeless 0 0 0 0 2  PHQ - 2 Score 0 0 0 1 5  Altered sleeping 0 0 2 2 0  Tired, decreased energy 0 0 0 0 3  Change in appetite 0 0 0 0 0  Feeling bad or failure about yourself  0 0 0 0 0  Trouble concentrating 0 0 0 0 0  Moving slowly or fidgety/restless 0 0 0 0 0  Suicidal thoughts 0 0 0 0 0  PHQ-9 Score 0 0 2 3 8   Difficult doing work/chores Not difficult at all Not difficult at all Not difficult at all Not difficult at all  Not difficult at all     Advanced Directives Does patient have a HCPOA?    no If yes, name and contact information:  Does patient have a living will or MOST form?  no  Past Medical History:  Past Medical History:  Diagnosis Date   Anxiety    Colon cancer screening    Depression    Hyperlipidemia    Hypertension    Perforated bowel ([place]) 12/06/2017    Surgical History:  Past Surgical History:  Procedure Laterality Date   BREAST BIOPSY Left 2015   CORE W/CLIP - NEG   BREAST BIOPSY Left 07/2007   neg bx    CESAREAN SECTION     COLON SURGERY     COLONOSCOPY WITH PROPOFOL N/A 02/18/2018   Procedure: COLONOSCOPY WITH PROPOFOL;  Surgeon: Jonathon Bellows, MD;  Location: [facility] ENDOSCOPY;  Service: Gastroenterology;  Laterality: N/A;   COLONOSCOPY WITH PROPOFOL N/A 03/25/2018   Procedure: COLONOSCOPY WITH PROPOFOL;  Surgeon: Jonathon Bellows, MD;  Location: [facility] ENDOSCOPY;  Service: Gastroenterology;  Laterality: N/A;   COLONOSCOPY WITH PROPOFOL N/A 08/25/2018   Procedure: COLONOSCOPY WITH PROPOFOL;  Surgeon: Lin Landsman, MD;  Location: [facility]  ENDOSCOPY;  Service: Gastroenterology;  Laterality: N/A;   LAPAROTOMY N/A 12/06/2017   Procedure: EXPLORATORY LAPAROTOMY, sigmoid colectomy, anastomosis;  Surgeon: Vickie Epley, MD;  Location: [facility] ORS;  Service: General;  Laterality: N/A;   TUBAL LIGATION     XI ROBOTIC ASSISTED VENTRAL HERNIA N/A 11/26/2018   Procedure: XI ROBOTIC ASSISTED VENTRAL HERNIA;  Surgeon: Jules Husbands, MD;  Location: [facility] ORS;  Service: General;  Laterality: N/A;    Medications:  Current Outpatient Medications on File Prior to Visit  Medication Sig   BELSOMRA 10 MG TABS Take 1 TABLET (10 mg) by mouth at bedtime.   Blood Glucose Monitoring Suppl (ONE TOUCH ULTRA 2) w/Device KIT AS DIRECTED.   colchicine 0.6 MG tablet Take 1 tablet (0.6 mg total) by mouth daily.   fluticasone (FLONASE) 50 MCG/ACT nasal spray Place 2 sprays into both nostrils daily.   Lancets (ONETOUCH DELICA PLUS [id number]) MISC CHECK BLOOD SUGAR ONCE DAILY.   meloxicam (MOBIC) 15 MG tablet Take 15 mg by mouth daily.   ONETOUCH ULTRA test strip CHECK BLOOD SUGAR ONCE DAILY.   tiZANidine (ZANAFLEX) 4 MG tablet Take 1 tablet (4 mg total) by mouth at bedtime as needed for muscle spasms.   triamcinolone cream (KENALOG) 0.1 % Apply 1 Application topically 2 (two) times daily.   No current facility-administered medications on file prior to visit.    Allergies:  No Known Allergies  Social History:  Social History   Socioeconomic History   Marital status: Divorced    Spouse name: Not on file   Number of children: Not on file   Years of education: Not on file   Highest education level: High school graduate  Occupational History   Not on file  Tobacco Use   Smoking status: Former    Types: Cigarettes    Quit date: 11/18/2012    Years since quitting: 8.9   Smokeless tobacco: Never   Tobacco comments:    quit over 15 years ago   Vaping Use   Vaping Use: Every day  Substance and Sexual Activity   Alcohol use: No   Drug use: No    Sexual activity: Not Currently  Other Topics Concern   Not on file  Social History Narrative   Goes out to eat with sisters once a month  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    Goes to walking twice a week for 30 minutes    Social Determinants of Health   Financial Resource Strain: Low Risk  (10/05/2020)   Overall Financial Resource Strain (CARDIA)    Difficulty of Paying Living Expenses: Not hard at all  Food Insecurity: No Food Insecurity (10/05/2020)   Hunger Vital Sign    Worried About Running Out of Food in the Last Year: Never true    Ran Out of Food in the Last Year: Never true  Transportation Needs: No Transportation Needs (10/05/2020)   PRAPARE - Hydrologist (Medical): No    Lack of Transportation (Non-Medical): No  Physical Activity: Insufficiently Active (10/05/2020)   Exercise Vital Sign    Days of Exercise per Week: 7 days    Minutes of Exercise per Session: 10 min  Stress: No Stress Concern Present (10/05/2020)   [place]    Feeling of Stress : Not at all  Social Connections: Moderately Integrated (10/05/2020)   Social Connection and Isolation Panel [NHANES]    Frequency of Communication with Friends and Family: More than three times a week    Frequency of Social Gatherings with Friends and Family: More than three times a week    Attends Religious Services: More than 4 times per year    Active Member of [employer] or Organizations: Yes    Attends Music therapist: More than 4 times per year    Marital Status: Divorced  Intimate Partner Violence: Not At Risk (10/05/2020)   Humiliation, Afraid, Rape, and Kick questionnaire    Fear of Current or Ex-Partner: No    Emotionally Abused: No    Physically Abused: No    Sexually Abused: No   Social History   Tobacco Use  Smoking Status Former   Types: Cigarettes   Quit date: 11/18/2012   Years since quitting: 8.9   Smokeless Tobacco Never  Tobacco Comments   quit over 15 years ago    Social History   Substance and Sexual Activity  Alcohol Use No    Family History:  Family History  Problem Relation Age of Onset   Breast cancer Mother    Liver cancer Sister    Pancreatic cancer Sister    Stomach cancer Brother    Lung cancer Brother    Brain cancer Sister    Diabetes Father    Heart disease Father    Breast cancer Sister    Heart attack Brother    COPD Neg Hx    Stroke Neg Hx     Past medical history, surgical history, medications, allergies, family history and social history reviewed with patient today and changes made to appropriate areas of the chart.   Review of Systems  Eyes:  Negative for blurred vision and double vision.  Respiratory:  Negative for shortness of breath.   Cardiovascular:  Negative for chest pain, palpitations and leg swelling.  Neurological:  Negative for dizziness and headaches.    All other ROS negative except what is listed above and in the HPI.      Objective:    BP 124/75   Pulse (!) 103   Temp 98.6 F (37 C) (Oral)   Wt 170 lb 14.4 oz (77.5 kg)   SpO2 95%   BMI 26.76 kg/m   Wt Readings from Last 3 Encounters:  11/08/21 170 lb 14.4 oz (77.5 kg)  10/22/21 170 lb 8 oz (77.3 kg)  08/15/21 171 lb 3.2 oz (77.7 kg)    No results found.  Physical Exam Vitals and nursing note reviewed.  Constitutional:      General: She is awake. She is not in acute distress.    Appearance: Normal appearance. She is well-developed and normal weight. She is not ill-appearing.  HENT:     Head: Normocephalic and atraumatic.     Right Ear: Hearing, tympanic membrane, ear canal and external ear normal. No drainage.     Left Ear: Hearing, tympanic membrane, ear canal and external ear normal. No drainage.     Nose: Nose normal.     Right Sinus: No maxillary sinus tenderness or frontal sinus tenderness.     Left Sinus: No maxillary sinus tenderness or frontal sinus  tenderness.     Mouth/Throat:     Mouth: Mucous membranes are moist.     Pharynx: Oropharynx is clear. Uvula midline. No pharyngeal swelling, oropharyngeal exudate or posterior oropharyngeal erythema.  Eyes:     General: Lids are normal.        Right eye: No discharge.        Left eye: No discharge.     Extraocular Movements: Extraocular movements intact.     Conjunctiva/sclera: Conjunctivae normal.     Pupils: Pupils are equal, round, and reactive to light.     Visual Fields: Right eye visual fields normal and left eye visual fields normal.  Neck:     Thyroid: No thyromegaly.     Vascular: No carotid bruit.     Trachea: Trachea normal.  Cardiovascular:     Rate and Rhythm: Normal rate and regular rhythm.     Heart sounds: Normal heart sounds. No murmur heard.    No gallop.  Pulmonary:     Effort: Pulmonary effort is normal. No accessory muscle usage or respiratory distress.     Breath sounds: Normal breath sounds.  Chest:  Breasts:    Right: Normal.     Left: Normal.  Abdominal:     General: Bowel sounds are normal.     Palpations: Abdomen is soft. There is no hepatomegaly or splenomegaly.     Tenderness: There is no abdominal tenderness.  Musculoskeletal:        General: Normal range of motion.     Cervical back: Normal range of motion and neck supple.     Right lower leg: No edema.     Left lower leg: No edema.  Lymphadenopathy:     Head:     Right side of head: No submental, submandibular, tonsillar, preauricular or posterior auricular adenopathy.     Left side of head: No submental, submandibular, tonsillar, preauricular or posterior auricular adenopathy.     Cervical: No cervical adenopathy.     Upper Body:     Right upper body: No supraclavicular, axillary or pectoral adenopathy.     Left upper body: No supraclavicular, axillary or pectoral adenopathy.  Skin:    General: Skin is warm and dry.     Capillary Refill: Capillary refill takes less than 2 seconds.      Findings: No rash.  Neurological:     Mental Status: She is alert and oriented to person, place, and time.     Gait: Gait is intact.     Deep Tendon Reflexes: Reflexes are normal and symmetric.     Reflex Scores:      Brachioradialis reflexes are 2+ on the right side  and 2+ on the left side.      Patellar reflexes are 2+ on the right side and 2+ on the left side. Psychiatric:        Attention and Perception: Attention normal.        Mood and Affect: Mood normal.        Speech: Speech normal.        Behavior: Behavior normal. Behavior is cooperative.        Thought Content: Thought content normal.        Judgment: Judgment normal.        05/12/2020    2:38 PM 04/01/2018    1:33 PM 11/13/2016    2:04 PM  6CIT Screen  What Year? 0 points 0 points 0 points  What month? 0 points 0 points 0 points  What time? 0 points 0 points 0 points  Count back from 20 0 points 0 points 0 points  Months in reverse 0 points 0 points 0 points  Repeat phrase 4 points 2 points 0 points  Total Score 4 points 2 points 0 points    Cognitive Testing - 6-CIT  Correct? Score   What year is it? yes 0 Yes = 0    No = 4  What month is it? yes 0 Yes = 0    No = 3  Remember:     [address]ia [address], [place], [place]     What time is it? yes 0 Yes = 0    No = 3  Count backwards from 20 to 1 yes 0 Correct = 0    1 error = 2   More than 1 error = 4  Say the months of the year in reverse. yes 0 Correct = 0    1 error = 2   More than 1 error = 4  What address did I ask you to remember? yes 2 Correct = 0  1 error = 2    2 error = 4    3 error = 6    4 error = 8    All wrong = 10       TOTAL SCORE  2/28   Interpretation:  Normal  Normal (0-7) Abnormal (8-28)   Results for orders placed or performed in visit on 11/08/21  CBC with Differential/Platelet  Result Value Ref Range   WBC 8.1 3.4 - 10.8 x10E3/uL   RBC 4.70 3.77 - 5.28 x10E6/uL   Hemoglobin 14.1 11.1 - 15.9 g/dL   Hematocrit 41.6 34.0 - 46.6 %   MCV  89 79 - 97 fL   MCH 30.0 26.6 - 33.0 pg   MCHC 33.9 31.5 - 35.7 g/dL   RDW 13.0 11.7 - 15.4 %   Platelets 287 150 - 450 x10E3/uL   Neutrophils 58 Not Estab. %   Lymphs 28 Not Estab. %   Monocytes 7 Not Estab. %   Eos 6 Not Estab. %   Basos 1 Not Estab. %   Neutrophils Absolute 4.7 1.4 - 7.0 x10E3/uL   Lymphocytes Absolute 2.2 0.7 - 3.1 x10E3/uL   Monocytes Absolute 0.6 0.1 - 0.9 x10E3/uL   EOS (ABSOLUTE) 0.5 (H) 0.0 - 0.4 x10E3/uL   Basophils Absolute 0.1 0.0 - 0.2 x10E3/uL   Immature Granulocytes 0 Not Estab. %   Immature Grans (Abs) 0.0 0.0 - 0.1 x10E3/uL  Comprehensive metabolic panel  Result Value Ref Range   Glucose 121 (H) 70 - 99 mg/dL  BUN 15 8 - 27 mg/dL   Creatinine, Ser 0.67 0.57 - 1.00 mg/dL   eGFR 94 >59 mL/min/1.73   BUN/Creatinine Ratio 22 12 - 28   Sodium 146 (H) 134 - 144 mmol/L   Potassium 3.7 3.5 - 5.2 mmol/L   Chloride 101 96 - 106 mmol/L   CO2 30 (H) 20 - 29 mmol/L   Calcium 9.6 8.7 - 10.3 mg/dL   Total Protein 6.6 6.0 - 8.5 g/dL   Albumin 4.4 3.9 - 4.9 g/dL   Globulin, Total 2.2 1.5 - 4.5 g/dL   Albumin/Globulin Ratio 2.0 1.2 - 2.2   Bilirubin Total <0.2 0.0 - 1.2 mg/dL   Alkaline Phosphatase 65 44 - 121 IU/L   AST 26 0 - 40 IU/L   ALT 38 (H) 0 - 32 IU/L  Lipid panel  Result Value Ref Range   Cholesterol, Total 219 (H) 100 - 199 mg/dL   Triglycerides 700 (HH) 0 - 149 mg/dL   HDL 35 (L) >39 mg/dL   VLDL Cholesterol Cal 110 (H) 5 - 40 mg/dL   LDL Chol Calc (NIH) 74 0 - 99 mg/dL   Chol/HDL Ratio 6.3 (H) 0.0 - 4.4 ratio  HgB A1c  Result Value Ref Range   Hgb A1c MFr Bld 6.5 (H) 4.8 - 5.6 %   Est. average glucose Bld gHb Est-mCnc 140 mg/dL  Microalbumin, Urine Waived  Result Value Ref Range   Microalb, Ur Waived 10 0 - 19 mg/L   Creatinine, Urine Waived 50 10 - 300 mg/dL   Microalb/Creat Ratio <30 <30 mg/g      Assessment & Plan:   Problem List Items Addressed This Visit       Cardiovascular and Mediastinum   Hypertension associated with  diabetes (HCC)    Chronic.  Controlled.  Continue with current medication regimen of HCTZ and Clonidine.  Labs ordered today. Will make further recommendations based on lab results. Continue to check blood pressures at home. If consistently elevated >140 call the office.  Refills sent today.  Return to clinic in 6 months for reevaluation.  Call sooner if concerns arise.        Relevant Medications   cloNIDine (CATAPRES) 0.1 MG tablet   hydrochlorothiazide (HYDRODIURIL) 25 MG tablet   metFORMIN (GLUCOPHAGE) 500 MG tablet   pravastatin (PRAVACHOL) 40 MG tablet   Other Relevant Orders   CBC with Differential/Platelet (Completed)   Aortic atherosclerosis (HCC)    Chronic.  Controlled.  Continue with current medication regimen on Pravastatin 32m.  Labs ordered today.  Refills sent.  Return to clinic in 6 months for reevaluation.  Call sooner if concerns arise.        Relevant Medications   cloNIDine (CATAPRES) 0.1 MG tablet   hydrochlorothiazide (HYDRODIURIL) 25 MG tablet   pravastatin (PRAVACHOL) 40 MG tablet   Other Relevant Orders   CBC with Differential/Platelet (Completed)   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(HCC)    Chronic.  Controlled.  Last A1c 6.6.  Continue with current medication regimen of Metformin 5015mBID.  Labs ordered today. Will make further recommendations based on lab results. Continue to check blood pressures at home. If consistently elevated >140 call the office.  Microalbumin obtained today. Refills sent today.  Return to clinic in 6 months for reevaluation.  Call sooner if concerns arise.        Relevant Medications   cloNIDine (CATAPRES) 0.1 MG tablet   hydrochlorothiazide (HYDRODIURIL) 25 MG tablet  metFORMIN (GLUCOPHAGE) 500 MG tablet   pravastatin (PRAVACHOL) 40 MG tablet   Other Relevant Orders   CBC with Differential/Platelet (Completed)   Comprehensive metabolic panel (Completed)   HgB A1c (Completed)   Urine Microalbumin  w/creat. ratio     Endocrine   Hyperlipidemia associated with type 2 diabetes mellitus (HCC)    Chronic.  Controlled.  Continue with current medication regimen of Pravastatin 22m.  Refills sent today.  Labs ordered today.  Return to clinic in 6 months for reevaluation.  Call sooner if concerns arise.        Relevant Medications   cloNIDine (CATAPRES) 0.1 MG tablet   hydrochlorothiazide (HYDRODIURIL) 25 MG tablet   metFORMIN (GLUCOPHAGE) 500 MG tablet   pravastatin (PRAVACHOL) 40 MG tablet   Other Relevant Orders   CBC with Differential/Platelet (Completed)   Lipid panel (Completed)   Lipid Profile     Other   Anxiety    Chronic. Controlled. Will increase Celexa to 260mdaily.  Continue with Hydroxyzine as needed.  Refills sent today.  Follow up in 6 months for reevaluation.  Call sooner if concerns arise.       Relevant Medications   hydrOXYzine (VISTARIL) 25 MG capsule   citalopram (CELEXA) 20 MG tablet   Advanced care planning/counseling discussion    A voluntary discussion about advance care planning including the explanation and discussion of advance directives was extensively discussed  with the patient for 10 minutes with patient.  Explanation about the health care proxy and Living will was reviewed and packet with forms with explanation of how to fill them out was given.  During this discussion, the patient was able to identify a health care proxy as her sister and plans plan to fill out the paperwork required.  Patient was offered a separate [place] for further assistance with forms.         Other Visit Diagnoses     Encounter for Medicare annual wellness exam    -  Primary        Preventative Services:  AAA screening: NA Health Risk Assessment and Personalized Prevention Plan: Assessed during visit Bone Mass Measurements: Up to date Breast Cancer Screening: Up to date CVD Screening: NA Cervical Cancer Screening: NA Colon Cancer Screening: Up  to date Depression Screening: Up to date Diabetes Screening: Up to date Glaucoma Screening: Up to date Hepatitis B vaccine:NA Hepatitis C screening: up to date  HIV Screening: Up to date Flu Vaccine: Up to date Lung cancer Screening: NA Obesity Screening: Up to date Pneumonia Vaccines (2): Up to date STI Screening: NA  Follow up plan: No follow-ups on file.   LABORATORY TESTING:  - Pap smear: not applicable  IMMUNIZATIONS:   - Tdap: Tetanus vaccination status reviewed: last tetanus booster within 10 years. - Influenza: Up to date - Pneumovax: Up to date - Prevnar: Up to date - Zostavax vaccine: Up to date  SCREENING: -Mammogram: Up to date  - Colonoscopy: Up to date  - Bone Density: Not applicable  -Hearing Test: Not applicable  -Spirometry: Not applicable   PATIENT COUNSELING:   Advised to take 1 mg of folate supplement per day if capable of pregnancy.   Sexuality: Discussed sexually transmitted diseases, partner selection, use of condoms, avoidance of unintended pregnancy  and contraceptive alternatives.   Advised to avoid cigarette smoking.  I discussed with the patient that most people either abstain from alcohol or drink within safe limits (<=14/week and <=4 drinks/occasion for  males, <=7/weeks and <= 3 drinks/occasion for females) and that the risk for alcohol disorders and other health effects rises proportionally with the number of drinks per week and how often a drinker exceeds daily limits.  Discussed cessation/primary prevention of drug use and availability of treatment for abuse.   Diet: Encouraged to adjust caloric intake to maintain  or achieve ideal body weight, to reduce intake of dietary saturated fat and total fat, to limit sodium intake by avoiding high sodium foods and not adding table salt, and to maintain adequate dietary potassium and calcium preferably from fresh fruits, vegetables, and low-fat dairy products.    stressed the importance of  regular exercise  Injury prevention: Discussed safety belts, safety helmets, smoke detector, smoking near bedding or upholstery.   Dental health: Discussed importance of regular tooth brushing, flossing, and dental visits.    NEXT PREVENTATIVE PHYSICAL DUE IN 1 YEAR. No follow-ups on file.

## 2021-11-09 DIAGNOSIS — Z7189 Other specified counseling: Secondary | ICD-10-CM | POA: Insufficient documentation

## 2021-11-09 LAB — CBC WITH DIFFERENTIAL/PLATELET
Basophils Absolute: 0.1 10*3/uL (ref 0.0–0.2)
Basos: 1 %
EOS (ABSOLUTE): 0.5 10*3/uL — ABNORMAL HIGH (ref 0.0–0.4)
Eos: 6 %
Hematocrit: 41.6 % (ref 34.0–46.6)
Hemoglobin: 14.1 g/dL (ref 11.1–15.9)
Immature Grans (Abs): 0 10*3/uL (ref 0.0–0.1)
Immature Granulocytes: 0 %
Lymphocytes Absolute: 2.2 10*3/uL (ref 0.7–3.1)
Lymphs: 28 %
MCH: 30 pg (ref 26.6–33.0)
MCHC: 33.9 g/dL (ref 31.5–35.7)
MCV: 89 fL (ref 79–97)
Monocytes Absolute: 0.6 10*3/uL (ref 0.1–0.9)
Monocytes: 7 %
Neutrophils Absolute: 4.7 10*3/uL (ref 1.4–7.0)
Neutrophils: 58 %
Platelets: 287 10*3/uL (ref 150–450)
RBC: 4.7 x10E6/uL (ref 3.77–5.28)
RDW: 13 % (ref 11.7–15.4)
WBC: 8.1 10*3/uL (ref 3.4–10.8)

## 2021-11-09 LAB — COMPREHENSIVE METABOLIC PANEL
ALT: 38 IU/L — ABNORMAL HIGH (ref 0–32)
AST: 26 IU/L (ref 0–40)
Albumin/Globulin Ratio: 2 (ref 1.2–2.2)
Albumin: 4.4 g/dL (ref 3.9–4.9)
Alkaline Phosphatase: 65 IU/L (ref 44–121)
BUN/Creatinine Ratio: 22 (ref 12–28)
BUN: 15 mg/dL (ref 8–27)
Bilirubin Total: <0.2 mg/dL (ref 0.0–1.2)
CO2: 30 mmol/L — ABNORMAL HIGH (ref 20–29)
Calcium: 9.6 mg/dL (ref 8.7–10.3)
Chloride: 101 mmol/L (ref 96–106)
Creatinine, Ser: 0.67 mg/dL (ref 0.57–1.00)
Globulin, Total: 2.2 g/dL (ref 1.5–4.5)
Glucose: 121 mg/dL — ABNORMAL HIGH (ref 70–99)
Potassium: 3.7 mmol/L (ref 3.5–5.2)
Sodium: 146 mmol/L — ABNORMAL HIGH (ref 134–144)
Total Protein: 6.6 g/dL (ref 6.0–8.5)
eGFR: 94 mL/min/{1.73_m2} (ref >59)

## 2021-11-09 LAB — HEMOGLOBIN A1C
Est. average glucose Bld gHb Est-mCnc: 140 mg/dL
Hgb A1c MFr Bld: 6.5 % — ABNORMAL HIGH (ref 4.8–5.6)

## 2021-11-09 LAB — LIPID PANEL
Chol/HDL Ratio: 6.3 ratio — ABNORMAL HIGH (ref 0.0–4.4)
Cholesterol, Total: 219 mg/dL — ABNORMAL HIGH (ref 100–199)
HDL: 35 mg/dL — ABNORMAL LOW (ref >39)
LDL Chol Calc (NIH): 74 mg/dL (ref 0–99)
Triglycerides: 700 mg/dL (ref 0–149)
VLDL Cholesterol Cal: 110 mg/dL — ABNORMAL HIGH (ref 5–40)

## 2021-11-09 NOTE — Progress Notes (Signed)
Please let patient know that her lab work shows that her cholesterol, specifically triglycerides, are significantly elevated from prior.  I recommend she decreased processed foods and refined sugar intake.   Otherwise, her a1c is well controlled at 6.5.  Everything else looks good.  No other concerns at this time.  I would like her to come back in fasting and recheck the cholesterol is 1 month.  I placed the order, please make her a lab appt.

## 2021-11-09 NOTE — Assessment & Plan Note (Signed)
Chronic.  Controlled.  Continue with current medication regimen on Pravastatin 40mg.  Labs ordered today.  Refills sent.  Return to clinic in 6 months for reevaluation.  Call sooner if concerns arise.   

## 2021-11-09 NOTE — Assessment & Plan Note (Signed)
Chronic.  Controlled.  Continue with current medication regimen of HCTZ and Clonidine.  Labs ordered today. Will make further recommendations based on lab results. Continue to check blood pressures at home. If consistently elevated >140 call the office.  Refills sent today.  Return to clinic in 6 months for reevaluation.  Call sooner if concerns arise.

## 2021-11-09 NOTE — Assessment & Plan Note (Signed)
Chronic.  Controlled.  Continue with current medication regimen of Pravastatin 40mg .  Refills sent today.  Labs ordered today.  Return to clinic in 6 months for reevaluation.  Call sooner if concerns arise.

## 2021-11-09 NOTE — Assessment & Plan Note (Signed)
Chronic. Controlled. Will increase Celexa to 20mg  daily.  Continue with Hydroxyzine as needed.  Refills sent today.  Follow up in 6 months for reevaluation.  Call sooner if concerns arise.

## 2021-11-09 NOTE — Assessment & Plan Note (Signed)
A voluntary discussion about advance care planning including the explanation and discussion of advance directives was extensively discussed  with the patient for 10 minutes with patient.  Explanation about the health care proxy and Living will was reviewed and packet with forms with explanation of how to fill them out was given.  During this discussion, the patient was able to identify a health care proxy as her sister and plans plan to fill out the paperwork required.  Patient was offered a separate [place] visit for further assistance with forms.

## 2021-11-09 NOTE — Assessment & Plan Note (Signed)
Chronic.  Controlled.  Last A1c 6.6.  Continue with current medication regimen of Metformin 500mg  BID.  Labs ordered today. Will make further recommendations based on lab results. Continue to check blood pressures at home. If consistently elevated >140 call the office.  Microalbumin obtained today. Refills sent today.  Return to clinic in 6 months for reevaluation.  Call sooner if concerns arise.

## 2021-12-09 ENCOUNTER — Other Ambulatory Visit: Payer: Self-pay

## 2021-12-09 ENCOUNTER — Emergency Department
Admission: EM | Admit: 2021-12-09 | Discharge: 2021-12-09 | Disposition: A | Discharge status: Home / Self Care | Payer: Medicare Other | Attending: Emergency Medicine | Admitting: Emergency Medicine

## 2021-12-09 ENCOUNTER — Emergency Department: Payer: Medicare Other

## 2021-12-09 DIAGNOSIS — M546 Pain in thoracic spine: Secondary | ICD-10-CM | POA: Insufficient documentation

## 2021-12-09 DIAGNOSIS — R0602 Shortness of breath: Secondary | ICD-10-CM | POA: Diagnosis not present

## 2021-12-09 DIAGNOSIS — Z743 Need for continuous supervision: Secondary | ICD-10-CM | POA: Diagnosis not present

## 2021-12-09 DIAGNOSIS — R531 Weakness: Secondary | ICD-10-CM | POA: Insufficient documentation

## 2021-12-09 DIAGNOSIS — R0689 Other abnormalities of breathing: Secondary | ICD-10-CM | POA: Diagnosis not present

## 2021-12-09 DIAGNOSIS — I10 Essential (primary) hypertension: Secondary | ICD-10-CM | POA: Insufficient documentation

## 2021-12-09 DIAGNOSIS — E1159 Type 2 diabetes mellitus with other circulatory complications: Secondary | ICD-10-CM | POA: Insufficient documentation

## 2021-12-09 DIAGNOSIS — R5383 Other fatigue: Secondary | ICD-10-CM | POA: Insufficient documentation

## 2021-12-09 DIAGNOSIS — J439 Emphysema, unspecified: Secondary | ICD-10-CM | POA: Diagnosis not present

## 2021-12-09 DIAGNOSIS — Z20822 Contact with and (suspected) exposure to covid-19: Secondary | ICD-10-CM | POA: Insufficient documentation

## 2021-12-09 DIAGNOSIS — I499 Cardiac arrhythmia, unspecified: Secondary | ICD-10-CM | POA: Diagnosis not present

## 2021-12-09 LAB — URINALYSIS, ROUTINE W REFLEX MICROSCOPIC
Bacteria, UA: NONE SEEN
Bilirubin Urine: NEGATIVE
Glucose, UA: NEGATIVE mg/dL
Hgb urine dipstick: NEGATIVE
Ketones, ur: NEGATIVE mg/dL
Nitrite: NEGATIVE
Protein, ur: NEGATIVE mg/dL
Specific Gravity, Urine: 1.014 (ref 1.005–1.030)
pH: 6 (ref 5.0–8.0)

## 2021-12-09 LAB — BASIC METABOLIC PANEL
Anion gap: 10 (ref 5–15)
BUN: 18 mg/dL (ref 8–23)
CO2: 27 mmol/L (ref 22–32)
Calcium: 9.6 mg/dL (ref 8.9–10.3)
Chloride: 105 mmol/L (ref 98–111)
Creatinine, Ser: 0.7 mg/dL (ref 0.44–1.00)
GFR, Estimated: >60 mL/min (ref >60)
Glucose, Bld: 173 mg/dL — ABNORMAL HIGH (ref 70–99)
Potassium: 3.7 mmol/L (ref 3.5–5.1)
Sodium: 142 mmol/L (ref 135–145)

## 2021-12-09 LAB — RESP PANEL BY RT-PCR (FLU A&B, COVID) ARPGX2
Influenza A by PCR: NEGATIVE
Influenza B by PCR: NEGATIVE
SARS Coronavirus 2 by RT PCR: NEGATIVE

## 2021-12-09 LAB — CBC
HCT: 46.8 % — ABNORMAL HIGH (ref 36.0–46.0)
Hemoglobin: 15.6 g/dL — ABNORMAL HIGH (ref 12.0–15.0)
MCH: 29.1 pg (ref 26.0–34.0)
MCHC: 33.3 g/dL (ref 30.0–36.0)
MCV: 87.3 fL (ref 80.0–100.0)
Platelets: 381 10*3/uL (ref 150–400)
RBC: 5.36 MIL/uL — ABNORMAL HIGH (ref 3.87–5.11)
RDW: 13.1 % (ref 11.5–15.5)
WBC: 7.1 10*3/uL (ref 4.0–10.5)
nRBC: 0 % (ref 0.0–0.2)

## 2021-12-09 NOTE — Discharge Instructions (Addendum)
Your blood work urine sample EKG and chest x-ray were all reassuring.  Your COVID and influenza test were negative.  I suspect that you have a viral illness.  Please make sure you are staying hydrated and rest over the next several days.  If you develop fevers chest pain shortness of breath or any new symptoms that are concerning to you please return to the emergency department.

## 2021-12-09 NOTE — ED Provider Notes (Signed)
[facility] Provider Note    Event Date/Time   First MD Initiated Contact with Patient 12/09/21 [phone]     (approximate)   History   Weakness   HPI  Marcia [name] is a 71 y.o. female  with pmh DM, HTN, HLD who p/w weakness and frequent bowel movements.  Patient tells me she had 7-8 bowel movements yesterday however was not diarrhea with formed stool.  No blood in the stool.  Denies abdominal pain nausea vomiting.  She does feel generally weak and fatigued.  Denies lightheadedness dizziness or syncope.  Denies urinary symptoms fevers chills cough congestion.  Does have some mid back pain which is typical for her.  Denies sick contacts.  She has been eating less but still drinking.  Denies nausea vomiting.     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   Aortic atherosclerosis (HCC) 04/18/2020   Colon cancer screening    Osteopenia 07/23/2018   Chronic low back pain 06/03/2018   Insomnia 12/09/2017   Anxiety 04/19/2017   Hypertension associated with diabetes (HCC) 11/13/2016   Hyperlipidemia associated with type 2 diabetes mellitus (HCC) 11/13/2016   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 --      Excl. in GC? --     Most recent vital signs: Vitals:   12/09/21 0800 12/09/21 0819  BP: (!) 160/73   Pulse: 90   Resp: 18   Temp:  98.1 F (36.7 C)  SpO2: 93%      General: Awake, no distress.  CV:  Good peripheral perfusion.  Resp:  Normal effort.  No increased work of breathing lungs are clear Abd:  No distention.  Abdomen is soft and nontender throughout Neuro:             Awake, Alert, Oriented x 3  Other:  No peripheral edema No CVA tenderness, mild upper lumbar paraspinal tenderness bilaterally   ED Results / Procedures / Treatments  Labs (all labs ordered are listed, but only abnormal results are displayed) Labs Reviewed  BASIC METABOLIC PANEL - Abnormal; Notable for the following components:      Result Value   Glucose, Bld 173 (*)    All other components within normal limits  CBC - Abnormal; Notable for the following components:   RBC 5.36 (*)    Hemoglobin 15.6 (*)  HCT 46.8 (*)    All other components within normal limits  URINALYSIS, ROUTINE W REFLEX MICROSCOPIC - Abnormal; Notable for the following components:   Color, Urine YELLOW (*)    APPearance CLEAR (*)    Leukocytes,Ua TRACE (*)    All other components within normal limits  RESP PANEL BY RT-PCR (FLU A&B, COVID) ARPGX2  CBG MONITORING, ED     EKG  EKG interpretation performed by myself: NSR, nml axis, nml intervals, no acute ischemic changes    RADIOLOGY I reviewed and interpreted the CXR which does not show any acute cardiopulmonary process    PROCEDURES:  Critical Care performed: No  Procedures  The patient is on the cardiac monitor to evaluate for evidence of arrhythmia and/or significant heart rate changes.   MEDICATIONS ORDERED IN ED: Medications - No data to display   IMPRESSION / MDM / ASSESSMENT AND PLAN / ED COURSE  I reviewed the triage vital signs and the nursing notes.                              Patient's presentation is most  consistent with acute complicated illness / injury requiring diagnostic workup.  Differential diagnosis includes, but is not limited to, gastroenteritis or other viral illness including COVID-19 influenza, anemia, hypoglycemia hyperglycemia electrolyte abnormality AKI and pneumonia UTI  The patient is a 71 year old female who presents with generalized weakness and more frequent bowel movements over the last day.  She has had 7-8 bowel movements but not diarrhea yesterday 2 episodes today nonbloody without associate abdominal pain nausea vomiting.  She also just feels generally weak but she is not having any chest pain shortness of breath her only other complaint is some mid to low back pain which is more of a chronic issue for her she has no urinary symptoms we will check urinalysis.  Plan to check EKG chest x-ray and some basic labs.  We will test COVID and influenza as well.  Patient's CBC and CMP are reassuring.  EKG nonischemic.  Chest x-ray is clear.  COVID and influenza testing is negative.  UA is clear.  Patient feeling improved after p.o. hydration.  Low suspicion for acute life-threatening process at this time.  Discussed with patient that I suspect this is likely viral but discussed return precautions.  She appropriate for discharge.       FINAL CLINICAL IMPRESSION(S) / ED DIAGNOSES   Final diagnoses:  Weakness     Rx / DC Orders   ED Discharge Orders     None        Note:  This document was prepared using Dragon voice recognition software and may include unintentional dictation errors.   Georga Hacking, MD 12/09/21 [phone]

## 2021-12-09 NOTE — ED Triage Notes (Signed)
Pt with ems reports weakness and increased bowel movements. Pt denies diarrhea, vomiting, pain, fever.

## 2021-12-09 NOTE — ED Notes (Signed)
Pt ambulated to toilet well with no assist with steady gait and denies dizziness. Pt hooked back up to monitor and relaxing in bed.

## 2021-12-10 ENCOUNTER — Other Ambulatory Visit: Payer: Medicare Other

## 2021-12-17 ENCOUNTER — Other Ambulatory Visit: Payer: Medicare Other

## 2021-12-17 DIAGNOSIS — E119 Type 2 diabetes mellitus without complications: Secondary | ICD-10-CM | POA: Diagnosis not present

## 2021-12-17 DIAGNOSIS — H2513 Age-related nuclear cataract, bilateral: Secondary | ICD-10-CM | POA: Diagnosis not present

## 2021-12-17 LAB — HM DIABETES EYE EXAM

## 2021-12-18 ENCOUNTER — Telehealth: Payer: Self-pay

## 2021-12-18 ENCOUNTER — Encounter: Payer: Self-pay | Admitting: Nurse Practitioner

## 2021-12-18 NOTE — Telephone Encounter (Signed)
        Patient  visited [place] on 11/19    Telephone encounter attempt :  1st  A HIPAA compliant voice message was left requesting a return call.  Instructed patient to call back    Lenard Forth [facility] Guide, [facility], Care Management  [phone] [address] [place], [place], [place] [address] Phone: [phone] Email: [email or web address]

## 2021-12-19 ENCOUNTER — Telehealth: Payer: Self-pay

## 2021-12-19 NOTE — Telephone Encounter (Signed)
        Patient  visited [place] on 11/19   Telephone encounter attempt :  2nd  A HIPAA compliant voice message was left requesting a return call.  Instructed patient to call back    Lenard Forth [facility] Guide, [facility], Care Management  [phone] [address] [place], [place], [place] [address] Phone: [phone] Email: [email or web address]

## 2021-12-24 ENCOUNTER — Other Ambulatory Visit: Payer: Medicare Other

## 2021-12-25 ENCOUNTER — Ambulatory Visit: Payer: Medicare Other

## 2021-12-25 ENCOUNTER — Other Ambulatory Visit: Payer: Medicare Other

## 2021-12-27 ENCOUNTER — Other Ambulatory Visit: Payer: Medicare Other

## 2022-01-01 DIAGNOSIS — M7062 Trochanteric bursitis, left hip: Secondary | ICD-10-CM | POA: Diagnosis not present

## 2022-01-01 DIAGNOSIS — M7061 Trochanteric bursitis, right hip: Secondary | ICD-10-CM | POA: Diagnosis not present

## 2022-01-03 ENCOUNTER — Other Ambulatory Visit: Payer: Self-pay | Admitting: Nurse Practitioner

## 2022-01-03 NOTE — Telephone Encounter (Signed)
Requested Prescriptions  Pending Prescriptions Disp Refills   ONETOUCH ULTRA test strip [Pharmacy Med Name: ONETOUCH ULTRA BLUE TEST STRP] 100 strip 1    Sig: CHECK BLOOD SUGAR ONCE DAILY.     Endocrinology: Diabetes - Testing Supplies Passed - 01/03/2022  8:35 AM      Passed - Valid encounter within last 12 months    Recent Outpatient Visits           1 month ago Encounter for Medicare annual wellness exam   [facility] Larae Grooms, NP   2 months ago Arthralgia of toe of right foot   [facility] Mecum, Oswaldo Conroy, PA-C   4 months ago Anxiety   [place] Larae Grooms, NP   6 months ago Black stools   [facility] Larae Grooms, NP   7 months ago Annual physical exam   [facility] Larae Grooms, NP       Future Appointments             In 4 weeks Larae Grooms, NP [facility], [facility]

## 2022-01-03 NOTE — Telephone Encounter (Signed)
Unable to refill per protocol, last refill by provider 01/03/22 for 100 and 1 refill. Will refuse duplicate request.  Requested Prescriptions  Pending Prescriptions Disp Refills   ONETOUCH ULTRA test strip [Pharmacy Med Name: ONETOUCH ULTRA BLUE TEST STRP] 50 strip 0    Sig: CHECK BLOOD SUGAR ONCE DAILY.     Endocrinology: Diabetes - Testing Supplies Passed - 01/03/2022 12:07 PM      Passed - Valid encounter within last 12 months    Recent Outpatient Visits           1 month ago Encounter for Medicare annual wellness exam   [facility] Larae Grooms, NP   2 months ago Arthralgia of toe of right foot   [facility] Mecum, Oswaldo Conroy, PA-C   4 months ago Anxiety   [facility] Larae Grooms, NP   6 months ago Black stools   [facility] Larae Grooms, NP   7 months ago Annual physical exam   [facility] Larae Grooms, NP       Future Appointments             In 4 weeks Larae Grooms, NP [facility], [facility]

## 2022-01-04 ENCOUNTER — Other Ambulatory Visit: Payer: Medicare Other

## 2022-01-04 DIAGNOSIS — E1169 Type 2 diabetes mellitus with other specified complication: Secondary | ICD-10-CM | POA: Diagnosis not present

## 2022-01-04 DIAGNOSIS — E785 Hyperlipidemia, unspecified: Secondary | ICD-10-CM | POA: Diagnosis not present

## 2022-01-05 LAB — LIPID PANEL
Chol/HDL Ratio: 4.4 ratio (ref 0.0–4.4)
Cholesterol, Total: 164 mg/dL (ref 100–199)
HDL: 37 mg/dL — ABNORMAL LOW (ref >39)
LDL Chol Calc (NIH): 65 mg/dL (ref 0–99)
Triglycerides: 402 mg/dL — ABNORMAL HIGH (ref 0–149)
VLDL Cholesterol Cal: 62 mg/dL — ABNORMAL HIGH (ref 5–40)

## 2022-01-07 ENCOUNTER — Telehealth: Payer: Self-pay

## 2022-01-07 NOTE — Telephone Encounter (Signed)
Pt returned our call. Shared provider's note.   Larae Grooms, NP 01/07/2022  7:55 AM EST     Please let patient know that her cholesterol improved with her fasting.  However, her Triglycerides are still elevated.  I recommend decreasing processed foods and refined sugar intake.      No questions at this time.

## 2022-01-07 NOTE — Progress Notes (Signed)
Please let patient know that her cholesterol improved with her fasting.  However, her Triglycerides are still elevated.  I recommend decreasing processed foods and refined sugar intake.

## 2022-01-18 ENCOUNTER — Other Ambulatory Visit: Payer: Self-pay | Admitting: Nurse Practitioner

## 2022-01-19 NOTE — Telephone Encounter (Signed)
Requested medication (s) are due for refill today: yes  Requested medication (s) are on the active medication list: yes  Last refill:  10/10/21  Future visit scheduled: yes  Notes to clinic:  Medication not assigned to a protocol, review manually.      Requested Prescriptions  Pending Prescriptions Disp Refills   BELSOMRA 10 MG TABS [Pharmacy Med Name: BELSOMRA 10 MG TABLET] 30 tablet 0    Sig: Take 1 TABLET (10 mg) by mouth at bedtime.     Off-Protocol Failed - 01/18/2022  9:32 AM      Failed - Medication not assigned to a protocol, review manually.      Passed - Valid encounter within last 12 months    Recent Outpatient Visits           2 months ago Encounter for [employer] annual wellness exam   [facility] Larae Grooms, NP   2 months ago Arthralgia of toe of right foot   [facility] Mecum, Oswaldo Conroy, PA-C   5 months ago Anxiety   [facility] Larae Grooms, NP   7 months ago Black stools   [facility] Larae Grooms, NP   7 months ago Annual physical exam   [facility] Larae Grooms, NP       Future Appointments             In 1 week Mecum, Oswaldo Conroy, PA-C [facility], [facility]

## 2022-01-31 ENCOUNTER — Ambulatory Visit: Payer: 59 | Admitting: Physician Assistant

## 2022-02-06 ENCOUNTER — Ambulatory Visit: Payer: Self-pay

## 2022-02-06 ENCOUNTER — Encounter: Payer: Self-pay | Admitting: Family Medicine

## 2022-02-06 ENCOUNTER — Telehealth (INDEPENDENT_AMBULATORY_CARE_PROVIDER_SITE_OTHER): Payer: 59 | Admitting: Family Medicine

## 2022-02-06 DIAGNOSIS — R112 Nausea with vomiting, unspecified: Secondary | ICD-10-CM | POA: Diagnosis not present

## 2022-02-06 MED ORDER — ONDANSETRON HCL 4 MG PO TABS: 4.0000 mg | ORAL_TABLET | Freq: Three times a day (TID) | ORAL | 0 refills | Status: DC | PRN

## 2022-02-06 NOTE — Telephone Encounter (Signed)
Pt scheduled virtually

## 2022-02-06 NOTE — Progress Notes (Signed)
Virtual Visit via Video Note  I connected with Encarnacion Chu on 02/06/22 at 11:20 AM EST by a video enabled telemedicine application and verified that I am speaking with the correct person using two identifiers.  Location: Patient: home Provider: home   I discussed the limitations of evaluation and management by telemedicine and the availability of in person appointments. The patient expressed understanding and agreed to proceed.  History of Present Illness:  UPPER RESPIRATORY TRACT INFECTION - symptom onset Sunday  - nausea, chills, loss of appetite, fatigue, fever - hasn't tested for COVID  Fever: yes Cough: no Shortness of breath: no Chest pain: no Chest congestion: no Nasal congestion: no Vomiting: yes, this morning last time. Fatigue: yes Sick contacts: no Context: stable Treatments attempted: none     Observations/Objective:  Tired appearing, in mild acute distress. Speaks in full sentences. Comfortable WOB on RA. No resp distress.    Assessment and Plan:  VIRAL ILLNESS Moderate sx. Will test for COVID, flu, would be a candidate for antiviral treatment if positive. Reviewed OTC symptom relief, self-quarantine guidelines, and emergency precautions.     I discussed the assessment and treatment plan with the patient. The patient was provided an opportunity to ask questions and all were answered. The patient agreed with the plan and demonstrated an understanding of the instructions.   The patient was advised to call back or seek an in-person evaluation if the symptoms worsen or if the condition fails to improve as anticipated.  I provided 7 minutes of non-face-to-face time during this encounter.   Myles Gip, DO

## 2022-02-06 NOTE — Patient Instructions (Signed)
It was great to see you!  Our plans for today:  - Take the zofran as needed for nausea, vomiting. - Come to the clinic this afternoon for testing for COVID and flu. - If you are positive for COVID, we will treat you with an antiviral paxlovid. See [email or web address] for more information about the medication. - See below for self-isolation guidelines. You may end your quarantine if your test is negative, or if positive, once you are 10 days from symptom onset and fever free for 24 hours without use of tylenol or ibuprofen. Wear a well-fitting N95 mask if you have to go out and about. - Certainly, if you are having difficulties breathing or unable to keep down fluids, go to the Emergency Department.   Take care and seek immediate care sooner if you develop any concerns.   Dr. Ky Barban

## 2022-02-06 NOTE — Telephone Encounter (Signed)
Pt says she feels sick and does not want to come in, says she has a fever, sore, sick on her stomach, no appetite. Seeking abx      Chief Complaint: Fever, 102, nausea. Asking to be worked in today for virtual visit. Symptoms: Above Frequency: This week. Pertinent Negatives: Patient denies  Disposition: [] ED /[] Urgent Care (no appt availability in office) / [] Appointment(In office/virtual)/ []  [facility]/ [] Home Care/ [] Refused Recommended Disposition /[] [facility]/ [x]  Follow-up with PCP Additional Notes: Please advis ept.   Answer Assessment - Initial Assessment Questions 1. TEMPERATURE: "What is the most recent temperature?"  "How was it measured?"      102 2. ONSET: "When did the fever start?"      This week 3. CHILLS: "Do you have chills?" If yes: "How bad are they?"  (e.g., none, mild, moderate, severe)   - NONE: no chills   - MILD: feeling cold   - MODERATE: feeling very cold, some shivering (feels better under a thick blanket)   - SEVERE: feeling extremely cold with shaking chills (general body shaking, rigors; even under a thick blanket)      Mild 4. OTHER SYMPTOMS: "Do you have any other symptoms besides the fever?"  (e.g., abdomen pain, cough, diarrhea, earache, headache, sore throat, urination pain)     Nausea, body aches 5. CAUSE: If there are no symptoms, ask: "What do you think is causing the fever?"      Unsure 6. CONTACTS: "Does anyone else in the family have an infection?"     No 7. TREATMENT: "What have you done so far to treat this fever?" (e.g., medications)     Tylenol 8. IMMUNOCOMPROMISE: "Do you have of the following: diabetes, HIV positive, splenectomy, cancer chemotherapy, chronic steroid treatment, transplant patient, etc."     No 9. PREGNANCY: "Is there any chance you are pregnant?" "When was your last menstrual period?"     No 10. TRAVEL: "Have you traveled out of the country in the last month?" (e.g., travel history,  exposures)       No  Protocols used: [facility]

## 2022-02-07 ENCOUNTER — Ambulatory Visit: Payer: 59 | Admitting: Physician Assistant

## 2022-03-01 ENCOUNTER — Ambulatory Visit: Payer: Self-pay

## 2022-03-01 NOTE — Telephone Encounter (Signed)
  Chief Complaint: constipation Symptoms: hard stools 2-3x/day Frequency: a few days  Pertinent Negatives: Patient denies abdominal pain, rectal pain, nausea or bleeding  Disposition: [] ED /[] Urgent Care (no appt availability in office) / [] Appointment(In office/virtual)/ []  [facility]/ [x] Home Care/ [] Refused Recommended Disposition /[] [facility]/ []  Follow-up with PCP Additional Notes: advised pt to try to increase fiber and try Colace or Senna to soften stools and if no improvement by Monday to call back and schedule appt. Pt verbalized understanding.   Summary: Rx request with symptoms   Pt says she has been sick all week, has chills every now and then. Not sick on her stomach, she has frequent bowel movements. More than usual, not diarrhea. She says she does not have a way to get to the doctor and she does not drive  [place], [place] [address] Phone: [phone] Fax: [phone]  Asking to receive anything that will help treat her symptoms     Reason for Disposition  MILD constipation  Answer Assessment - Initial Assessment Questions 1. STOOL PATTERN OR FREQUENCY: "How often do you have a bowel movement (BM)?"  (Normal range: 3 times a day to every 3 days)  "When was your last BM?"       2-3 per day  3. RECTAL PAIN: "Does your rectum hurt when the stool comes out?" If Yes, ask: "Do you have hemorrhoids? How bad is the pain?"  (Scale 1-10; or mild, moderate, severe)     no 4. STOOL COMPOSITION: "Are the stools hard?"      yes 5. BLOOD ON STOOLS: "Has there been any blood on the toilet tissue or on the surface of the BM?" If Yes, ask: "When was the last time?"     no 7. CHANGES IN DIET OR HYDRATION: "Have there been any recent changes in your diet?" "How much fluids are you drinking on a daily basis?"  "How much have you had to drink today?"     No drinking plenty of fluids  9. LAXATIVES: "Have  you been using any stool softeners, laxatives, or enemas?"  If Yes, ask "What, how often, and when was the last time?"     no 12. OTHER SYMPTOMS: "Do you have any other symptoms?" (e.g., abdomen pain, bloating, fever, vomiting)       no  Protocols used: Constipation-A-AH

## 2022-03-11 ENCOUNTER — Encounter: Payer: Self-pay | Admitting: Nurse Practitioner

## 2022-03-11 ENCOUNTER — Ambulatory Visit (INDEPENDENT_AMBULATORY_CARE_PROVIDER_SITE_OTHER): Payer: 59 | Admitting: Nurse Practitioner

## 2022-03-11 VITALS — BP 135/71 | HR 101 | Temp 98.0°F | Ht 69.0 in | Wt 156.9 lb

## 2022-03-11 DIAGNOSIS — R21 Rash and other nonspecific skin eruption: Secondary | ICD-10-CM

## 2022-03-11 DIAGNOSIS — R5383 Other fatigue: Secondary | ICD-10-CM

## 2022-03-11 DIAGNOSIS — E1169 Type 2 diabetes mellitus with other specified complication: Secondary | ICD-10-CM | POA: Diagnosis not present

## 2022-03-11 DIAGNOSIS — F419 Anxiety disorder, unspecified: Secondary | ICD-10-CM

## 2022-03-11 DIAGNOSIS — E785 Hyperlipidemia, unspecified: Secondary | ICD-10-CM

## 2022-03-11 DIAGNOSIS — E1159 Type 2 diabetes mellitus with other circulatory complications: Secondary | ICD-10-CM

## 2022-03-11 DIAGNOSIS — I152 Hypertension secondary to endocrine disorders: Secondary | ICD-10-CM

## 2022-03-11 DIAGNOSIS — I700 Atherosclerosis of aorta: Secondary | ICD-10-CM

## 2022-03-11 MED ORDER — PREDNISONE 10 MG PO TABS: 10.0000 mg | ORAL_TABLET | Freq: Every day | ORAL | 0 refills | Status: DC

## 2022-03-11 MED ORDER — BUPROPION HCL ER (XL) 150 MG PO TB24: 150.0000 mg | ORAL_TABLET | Freq: Every day | ORAL | 0 refills | Status: DC

## 2022-03-11 NOTE — Assessment & Plan Note (Signed)
Chronic.  Controlled.  Continue with current medication regimen on Pravastatin 56m.  Labs ordered today.  Refills sent.  Return to clinic in 6 months for reevaluation.  Call sooner if concerns arise.

## 2022-03-11 NOTE — Progress Notes (Signed)
BP 135/71 (BP Location: Left Arm, Cuff Size: Normal)   Pulse (!) 101   Temp 98 F (36.7 C) (Oral)   Ht 5' 9"$  (1.753 m)   Wt 156 lb 14.4 oz (71.2 kg)   SpO2 92%   BMI 23.17 kg/m    Subjective:    Patient ID: Marcia [name], female    DOB: 06-20-1950, 71 y.o.   MRN: [id number]  HPI: Marcia [name] is a 72 y.o. female  Chief Complaint  Patient presents with   Diabetes   Hyperlipidemia   Hypertension   Eye Problem    Patient says her R eye is itching and has a redness around the eyelid. Patient declines any OTC medications.    Rash    Patient says she noticed a rash on her L lower leg and now on her neck. Patient declines taking any medications to help with.    HYPERTENSION / HYPERLIPIDEMIA Satisfied with current treatment? yes Duration of hypertension: years BP monitoring frequency: daily BP range: 135/80s BP medication side effects: no Past BP meds: clonidine and HCTZ Duration of hyperlipidemia: years Cholesterol medication side effects: no Cholesterol supplements: none Past cholesterol medications: pravastatin (pravachol) Medication compliance: excellent compliance Aspirin: no Recent stressors: no Recurrent headaches: no Visual changes: no Palpitations: no Dyspnea: no Chest pain: no Lower extremity edema: no Dizzy/lightheaded: no  DIABETES Hypoglycemic episodes:no Polydipsia/polyuria: no Visual disturbance: no Chest pain: no Paresthesias: no Glucose Monitoring: yes  Accucheck frequency: Daily  Fasting glucose:130  Post prandial:  Evening:  Before meals: Taking Insulin?: no  Long acting insulin:  Short acting insulin: Blood Pressure Monitoring: daily Retinal Examination: Up to Date Foot Exam: Up to Date Diabetic Education: Not Completed Pneumovax: Up to Date Influenza: Not up to Date Aspirin: no  ANXIETY Patient states the Zoloft is not working as well for her as it was in the past.  Would like to increase the dose. Denies SI. Patient states  she is tired and doesn't have any energy.  She feels depressed.    RASH Patient states she has a rash on her left lower leg and on her neck.  The areas burn and itch.  States they have been there for about a week.  She hasn't changed any soaps, detergents or lotions.    Relevant past medical, surgical, family and social history reviewed and updated as indicated. Interim medical history since our last visit reviewed. Allergies and medications reviewed and updated.  Review of Systems  Eyes:  Negative for visual disturbance.  Respiratory:  Negative for cough, chest tightness and shortness of breath.   Cardiovascular:  Negative for chest pain, palpitations and leg swelling.  Endocrine: Negative for polydipsia and polyuria.  Genitourinary:        Pelvic pressure  Skin:  Positive for rash.  Neurological:  Negative for dizziness, numbness and headaches.  Psychiatric/Behavioral:  Positive for dysphoric mood. Negative for suicidal ideas. The patient is nervous/anxious.     Per HPI unless specifically indicated above     Objective:    BP 135/71 (BP Location: Left Arm, Cuff Size: Normal)   Pulse (!) 101   Temp 98 F (36.7 C) (Oral)   Ht 5' 9"$  (1.753 m)   Wt 156 lb 14.4 oz (71.2 kg)   SpO2 92%   BMI 23.17 kg/m   Wt Readings from Last 3 Encounters:  03/11/22 156 lb 14.4 oz (71.2 kg)  12/09/21 160 lb (72.6 kg)  11/08/21 170 lb 14.4 oz (77.5 kg)  Physical Exam Vitals and nursing note reviewed.  Constitutional:      General: She is not in acute distress.    Appearance: Normal appearance. She is normal weight. She is not ill-appearing, toxic-appearing or diaphoretic.  HENT:     Head: Normocephalic.     Right Ear: External ear normal.     Left Ear: External ear normal.     Nose: Nose normal.     Mouth/Throat:     Mouth: Mucous membranes are moist.     Pharynx: Oropharynx is clear.  Eyes:     General:        Right eye: No discharge.        Left eye: No discharge.      Extraocular Movements: Extraocular movements intact.     Conjunctiva/sclera: Conjunctivae normal.     Pupils: Pupils are equal, round, and reactive to light.  Cardiovascular:     Rate and Rhythm: Normal rate and regular rhythm.     Heart sounds: No murmur heard. Pulmonary:     Effort: Pulmonary effort is normal. No respiratory distress.     Breath sounds: Normal breath sounds. No wheezing or rales.  Abdominal:     General: Abdomen is flat. Bowel sounds are normal. There is no distension.     Palpations: Abdomen is soft.     Tenderness: There is no right CVA tenderness, left CVA tenderness or guarding.  Musculoskeletal:     Cervical back: Normal range of motion and neck supple.  Skin:    General: Skin is warm and dry.     Capillary Refill: Capillary refill takes less than 2 seconds.       Neurological:     General: No focal deficit present.     Mental Status: She is alert and oriented to person, place, and time. Mental status is at baseline.  Psychiatric:        Mood and Affect: Mood is depressed.        Behavior: Behavior normal.        Thought Content: Thought content normal.        Judgment: Judgment normal.     Results for orders placed or performed in visit on 01/04/22  Lipid Profile  Result Value Ref Range   Cholesterol, Total 164 100 - 199 mg/dL   Triglycerides 402 (H) 0 - 149 mg/dL   HDL 37 (L) >39 mg/dL   VLDL Cholesterol Cal 62 (H) 5 - 40 mg/dL   LDL Chol Calc (NIH) 65 0 - 99 mg/dL   Chol/HDL Ratio 4.4 0.0 - 4.4 ratio      Assessment & Plan:   Problem List Items Addressed This Visit       Cardiovascular and Mediastinum   Hypertension associated with diabetes ([place])    Chronic.  Controlled.  Continue with current medication regimen of HCTZ and Clonidine.  Labs ordered today. Will make further recommendations based on lab results. Continue to check blood pressures at home. If consistently elevated >140 call the office.  Return to clinic in 6 months for  reevaluation.  Call sooner if concerns arise.        Relevant Orders   Comp Met (CMET)   Aortic atherosclerosis ([place]) - Primary    Chronic.  Controlled.  Continue with current medication regimen on Pravastatin 38m.  Labs ordered today.  Refills sent.  Return to clinic in 6 months for reevaluation.  Call sooner if concerns arise.        Type  2 diabetes mellitus with other circulatory complications (HCC)   Relevant Orders   HgB A1c     Endocrine   Hyperlipidemia associated with type 2 diabetes mellitus ([place])   Relevant Orders   Lipid Profile     Other   Anxiety    Chronic.  Not well controlled.  Continue with Celexa.  Will add Wellbutrin 136m daily.  Side effects and benefits of medication discussed during visit.  Labs ordered today.  Will work to wean down Celexa if Wellbutrin improves patient's mood.  Follow up in 1 month.  Call sooner if concerns arise.       Relevant Medications   buPROPion (WELLBUTRIN XL) 150 MG 24 hr tablet   Other Visit Diagnoses     Other fatigue       Labs ordered at visit today. Will make recommendations based on lab results.   Relevant Orders   Vitamin D (25 hydroxy)   B12   Rheumatoid factor   ANA   ANA+ENA+DNA/DS+Antich+Centr   CK   Uric acid   Ehrlichia antibody panel   CBC with Differential/Platelet   Rash       Will treat with prednisone.  Rheumatology labs ordered at visit today.  Will make recommendations based on labs results.   Relevant Orders   Vitamin D (25 hydroxy)   B12   Rheumatoid factor   ANA   ANA+ENA+DNA/DS+Antich+Centr   CK   Uric acid   Ehrlichia antibody panel   CBC with Differential/Platelet   Spotted Fever Group Antibodies        Follow up plan: Return in about 1 month (around 04/09/2022) for Mood and Rash.

## 2022-03-11 NOTE — Assessment & Plan Note (Signed)
Chronic.  Not well controlled.  Continue with Celexa.  Will add Wellbutrin 162m daily.  Side effects and benefits of medication discussed during visit.  Labs ordered today.  Will work to wean down Celexa if Wellbutrin improves patient's mood.  Follow up in 1 month.  Call sooner if concerns arise.

## 2022-03-11 NOTE — Assessment & Plan Note (Signed)
Chronic.  Controlled.  Continue with current medication regimen of HCTZ and Clonidine.  Labs ordered today. Will make further recommendations based on lab results. Continue to check blood pressures at home. If consistently elevated >140 call the office.  Return to clinic in 6 months for reevaluation.  Call sooner if concerns arise.

## 2022-03-12 ENCOUNTER — Telehealth: Payer: Self-pay

## 2022-03-12 NOTE — Telephone Encounter (Signed)
Copied from [place] [id number]. Topic: General - Other >> Mar 12, 2022 12:31 PM Leone Payor F wrote: Reason for CRM: Patient is calling in to receive her lab results. Please follow up with patient.

## 2022-03-12 NOTE — Telephone Encounter (Signed)
Error

## 2022-03-13 ENCOUNTER — Other Ambulatory Visit: Payer: Self-pay

## 2022-03-13 ENCOUNTER — Ambulatory Visit: Payer: Self-pay | Admitting: *Deleted

## 2022-03-13 ENCOUNTER — Other Ambulatory Visit: Payer: Self-pay | Admitting: Nurse Practitioner

## 2022-03-13 ENCOUNTER — Telehealth: Payer: Self-pay

## 2022-03-13 DIAGNOSIS — Z1231 Encounter for screening mammogram for malignant neoplasm of breast: Secondary | ICD-10-CM

## 2022-03-13 LAB — COMPREHENSIVE METABOLIC PANEL
ALT: 27 IU/L (ref 0–32)
AST: 22 IU/L (ref 0–40)
Albumin/Globulin Ratio: 1.9 (ref 1.2–2.2)
Albumin: 4.4 g/dL (ref 3.9–4.9)
Alkaline Phosphatase: 61 IU/L (ref 44–121)
BUN/Creatinine Ratio: 25 (ref 12–28)
BUN: 15 mg/dL (ref 8–27)
Bilirubin Total: 0.4 mg/dL (ref 0.0–1.2)
CO2: 26 mmol/L (ref 20–29)
Calcium: 9.9 mg/dL (ref 8.7–10.3)
Chloride: 100 mmol/L (ref 96–106)
Creatinine, Ser: 0.61 mg/dL (ref 0.57–1.00)
Globulin, Total: 2.3 g/dL (ref 1.5–4.5)
Glucose: 113 mg/dL — ABNORMAL HIGH (ref 70–99)
Potassium: 3.2 mmol/L — ABNORMAL LOW (ref 3.5–5.2)
Sodium: 143 mmol/L (ref 134–144)
Total Protein: 6.7 g/dL (ref 6.0–8.5)
eGFR: 96 mL/min/{1.73_m2} (ref >59)

## 2022-03-13 LAB — EHRLICHIA ANTIBODY PANEL
E. Chaffeensis (HME) IgM Titer: NEGATIVE
E.Chaffeensis (HME) IgG: NEGATIVE
HGE IgG Titer: NEGATIVE
HGE IgM Titer: NEGATIVE

## 2022-03-13 LAB — LIPID PANEL
Chol/HDL Ratio: 4.3 ratio (ref 0.0–4.4)
Cholesterol, Total: 167 mg/dL (ref 100–199)
HDL: 39 mg/dL — ABNORMAL LOW (ref >39)
LDL Chol Calc (NIH): 77 mg/dL (ref 0–99)
Triglycerides: 314 mg/dL — ABNORMAL HIGH (ref 0–149)
VLDL Cholesterol Cal: 51 mg/dL — ABNORMAL HIGH (ref 5–40)

## 2022-03-13 LAB — CBC WITH DIFFERENTIAL/PLATELET
Basophils Absolute: 0.1 10*3/uL (ref 0.0–0.2)
Basos: 1 %
EOS (ABSOLUTE): 0.2 10*3/uL (ref 0.0–0.4)
Eos: 3 %
Hematocrit: 46.6 % (ref 34.0–46.6)
Hemoglobin: 15.6 g/dL (ref 11.1–15.9)
Immature Grans (Abs): 0 10*3/uL (ref 0.0–0.1)
Immature Granulocytes: 0 %
Lymphocytes Absolute: 2 10*3/uL (ref 0.7–3.1)
Lymphs: 26 %
MCH: 28.6 pg (ref 26.6–33.0)
MCHC: 33.5 g/dL (ref 31.5–35.7)
MCV: 86 fL (ref 79–97)
Monocytes Absolute: 0.6 10*3/uL (ref 0.1–0.9)
Monocytes: 8 %
Neutrophils Absolute: 4.7 10*3/uL (ref 1.4–7.0)
Neutrophils: 62 %
Platelets: 349 10*3/uL (ref 150–450)
RBC: 5.45 x10E6/uL — ABNORMAL HIGH (ref 3.77–5.28)
RDW: 12.8 % (ref 11.7–15.4)
WBC: 7.6 10*3/uL (ref 3.4–10.8)

## 2022-03-13 LAB — SPOTTED FEVER GROUP ANTIBODIES
Spotted Fever Group IgG: <1:64 {titer}
Spotted Fever Group IgM: <1:64 {titer}

## 2022-03-13 LAB — ANA+ENA+DNA/DS+ANTICH+CENTR
ANA Titer 1: NEGATIVE
Anti JO-1: <0.2 AI (ref 0.0–0.9)
Centromere Ab Screen: <0.2 AI (ref 0.0–0.9)
Chromatin Ab SerPl-aCnc: <0.2 AI (ref 0.0–0.9)
ENA RNP Ab: <0.2 AI (ref 0.0–0.9)
ENA SM Ab Ser-aCnc: <0.2 AI (ref 0.0–0.9)
ENA SSA (RO) Ab: <0.2 AI (ref 0.0–0.9)
ENA SSB (LA) Ab: <0.2 AI (ref 0.0–0.9)
Scleroderma (Scl-70) (ENA) Antibody, IgG: <0.2 AI (ref 0.0–0.9)
dsDNA Ab: <1 IU/mL (ref 0–9)

## 2022-03-13 LAB — ANA: Anti Nuclear Antibody (ANA): NEGATIVE

## 2022-03-13 LAB — URIC ACID: Uric Acid: 5.1 mg/dL (ref 3.0–7.2)

## 2022-03-13 LAB — HEMOGLOBIN A1C
Est. average glucose Bld gHb Est-mCnc: 140 mg/dL
Hgb A1c MFr Bld: 6.5 % — ABNORMAL HIGH (ref 4.8–5.6)

## 2022-03-13 LAB — RHEUMATOID FACTOR: Rheumatoid fact SerPl-aCnc: 10.9 IU/mL (ref <14.0)

## 2022-03-13 LAB — VITAMIN D 25 HYDROXY (VIT D DEFICIENCY, FRACTURES): Vit D, 25-Hydroxy: 56.2 ng/mL (ref 30.0–100.0)

## 2022-03-13 LAB — VITAMIN B12: Vitamin B-12: 641 pg/mL (ref 232–1245)

## 2022-03-13 LAB — CK: Total CK: 79 U/L (ref 32–182)

## 2022-03-13 NOTE — Telephone Encounter (Signed)
Requested Prescriptions  Pending Prescriptions Disp Refills   hydrOXYzine (VISTARIL) 25 MG capsule [Pharmacy Med Name: HYDROXYZINE PAM 25 MG CAP] 270 capsule 0    Sig: TAKE 1 CAPSULE BY MOUTH THREE TIMES DAILY AS NEEDED FOR ANXIETY     Ear, Nose, and Throat:  Antihistamines 2 Passed - 03/13/2022  8:50 AM      Passed - Cr in normal range and within 360 days    Creatinine, Ser  Date Value Ref Range Status  03/11/2022 0.61 0.57 - 1.00 mg/dL Final         Passed - Valid encounter within last 12 months    Recent Outpatient Visits           2 days ago Aortic atherosclerosis [facility])   [place], Karen, NP   1 month ago Nausea and vomiting, unspecified vomiting type   [place] Myles Gip, DO   4 months ago Encounter for [employer] annual wellness exam   [place] Jon Billings, NP   4 months ago Arthralgia of toe of right foot   [place], PA-C   7 months ago [place] Jon Billings, NP       Future Appointments             In 3 weeks Jon Billings, NP [place], [facility]

## 2022-03-13 NOTE — Telephone Encounter (Signed)
Pt given lab results per notes of K. Mathis Dad, NP  on 03/13/22. Pt verbalized understanding. Patient reports she will schedule mammogram and f/u with a dermatologist.

## 2022-03-13 NOTE — Progress Notes (Signed)
Please let patient know that her lab work is unremarkable.  It does not explain the reason for her recurrent rash.  I would like her to see Dermatology for further evaluation and management.  Her A1c is well controlled. Liver, kidneys and electrolytes look good.

## 2022-03-13 NOTE — Telephone Encounter (Signed)
Called and LVM asking for patient to please return my call. See result note for lab results. Mammogram order has been placed.   OK for [facility] to speak to patient if she calls back.

## 2022-03-13 NOTE — Telephone Encounter (Signed)
Order for mammogram placed. Routing to provider for lab results, not resulted yet.

## 2022-03-13 NOTE — Telephone Encounter (Signed)
Order has been placed.

## 2022-03-13 NOTE — Telephone Encounter (Signed)
Copied from [place] [id number]. Topic: General - Inquiry >> Mar 13, 2022  7:56 AM Marcellus Scott wrote: Reason for CRM: Pt stated she received a letter from the hospital saying it was time for her mammogram. Pt is asking for an order to be sent so she can schedule her mammogram.  Pt is requesting a callback to discuss recent labs.   Please advice.

## 2022-03-22 ENCOUNTER — Other Ambulatory Visit: Payer: Self-pay | Admitting: Nurse Practitioner

## 2022-03-22 NOTE — Telephone Encounter (Signed)
Requested medication (s) are due for refill today - provider review   Requested medication (s) are on the active medication list -yes  Future visit scheduled - yes The patient wants the provider to know she is going to go to the dermatologist in two weeks and that the prednisone has really helped. Please assist patient further   Last refill: 03/11/22 #21  Notes to clinic: non delegated Rx  Requested Prescriptions  Pending Prescriptions Disp Refills   predniSONE (DELTASONE) 10 MG tablet 21 tablet 0    Sig: Take 1 tablet (10 mg total) by mouth daily with breakfast. Take 6 today, 5 tomorrow, and decrease by 1 each day.     Not Delegated - Endocrinology:  Oral Corticosteroids Failed - 03/22/2022  2:21 PM      Failed - This refill cannot be delegated      Failed - Manual Review: Eye exam for IOP if prolonged treatment      Failed - Glucose (serum) in normal range and within 180 days    Glucose  Date Value Ref Range Status  03/11/2022 113 (H) 70 - 99 mg/dL Final   Glucose, Bld  Date Value Ref Range Status  12/09/2021 173 (H) 70 - 99 mg/dL Final    Comment:    Glucose reference range applies only to samples taken after fasting for at least 8 hours.         Failed - K in normal range and within 180 days    Potassium  Date Value Ref Range Status  03/11/2022 3.2 (L) 3.5 - 5.2 mmol/L Final         Failed - Bone Mineral Density or Dexa Scan completed in the last 2 years      Passed - Na in normal range and within 180 days    Sodium  Date Value Ref Range Status  03/11/2022 143 134 - 144 mmol/L Final         Passed - Last BP in normal range    BP Readings from Last 1 Encounters:  03/11/22 135/71         Passed - Valid encounter within last 6 months    Recent Outpatient Visits           1 week ago Aortic atherosclerosis [facility])   [place] Jon Billings, NP   1 month ago Nausea and vomiting, unspecified vomiting type   [place] Myles Gip, DO   4 months ago Encounter for [employer] annual wellness exam   [place] Jon Billings, NP   5 months ago Arthralgia of toe of right foot   [place], PA-C   7 months ago [place], NP       Future Appointments             In 2 weeks Jon Billings, NP [place], [facility]               Requested Prescriptions  Pending Prescriptions Disp Refills   predniSONE (DELTASONE) 10 MG tablet 21 tablet 0    Sig: Take 1 tablet (10 mg total) by mouth daily with breakfast. Take 6 today, 5 tomorrow, and decrease by 1 each day.     Not Delegated - Endocrinology:  Oral Corticosteroids Failed - 03/22/2022  2:21 PM      Failed - This refill  cannot be delegated      Failed - Manual Review: Eye exam for IOP if prolonged treatment      Failed - Glucose (serum) in normal range and within 180 days    Glucose  Date Value Ref Range Status  03/11/2022 113 (H) 70 - 99 mg/dL Final   Glucose, Bld  Date Value Ref Range Status  12/09/2021 173 (H) 70 - 99 mg/dL Final    Comment:    Glucose reference range applies only to samples taken after fasting for at least 8 hours.         Failed - K in normal range and within 180 days    Potassium  Date Value Ref Range Status  03/11/2022 3.2 (L) 3.5 - 5.2 mmol/L Final         Failed - Bone Mineral Density or Dexa Scan completed in the last 2 years      Passed - Na in normal range and within 180 days    Sodium  Date Value Ref Range Status  03/11/2022 143 134 - 144 mmol/L Final         Passed - Last BP in normal range    BP Readings from Last 1 Encounters:  03/11/22 135/71         Passed - Valid encounter within last 6 months    Recent Outpatient Visits           1 week ago Aortic atherosclerosis [facility])   [place] Jon Billings, NP   1  month ago Nausea and vomiting, unspecified vomiting type   [place] Myles Gip, DO   4 months ago Encounter for [employer] annual wellness exam   [place] Jon Billings, NP   5 months ago Arthralgia of toe of right foot   [place], PA-C   7 months ago [place] Jon Billings, NP       Future Appointments             In 2 weeks Jon Billings, NP [place], [facility]

## 2022-03-22 NOTE — Telephone Encounter (Signed)
Medication Refill - Medication: predniSONE (DELTASONE) 10 MG tablet   Has the patient contacted their pharmacy? No   Preferred Pharmacy (with phone number or street name):  [place], [place] Phone: [phone]  Fax: [phone]     Has the patient been seen for an appointment in the last year OR does the patient have an upcoming appointment? Yes.    The patient wants the provider to know she is going to go to the dermatologist in two weeks and that the prednisone has really helped. Please assist patient further

## 2022-03-25 MED ORDER — PREDNISONE 10 MG PO TABS: 10.0000 mg | ORAL_TABLET | Freq: Every day | ORAL | 0 refills | Status: DC

## 2022-03-25 NOTE — Telephone Encounter (Signed)
See message from the patient in the request.

## 2022-03-25 NOTE — Telephone Encounter (Signed)
Can you find out if patient is still having symptoms?

## 2022-03-25 NOTE — Telephone Encounter (Signed)
Patient states she is still having some symptoms, Prednisone helped but symptoms didn't completely go away.

## 2022-04-08 ENCOUNTER — Other Ambulatory Visit: Payer: Self-pay | Admitting: Nurse Practitioner

## 2022-04-08 NOTE — Telephone Encounter (Signed)
Requested Prescriptions  Pending Prescriptions Disp Refills   gabapentin (NEURONTIN) 100 MG capsule [Pharmacy Med Name: GABAPENTIN 100 MG CAPSULE] 90 capsule 0    Sig: Take 1 capsule (100 mg total) by mouth daily.     Neurology: Anticonvulsants - gabapentin Passed - 04/08/2022  8:39 AM      Passed - Cr in normal range and within 360 days    Creatinine, Ser  Date Value Ref Range Status  03/11/2022 0.61 0.57 - 1.00 mg/dL Final         Passed - Completed PHQ-2 or PHQ-9 in the last 360 days      Passed - Valid encounter within last 12 months    Recent Outpatient Visits           4 weeks ago Aortic atherosclerosis [facility])   [place] Jon Billings, NP   2 months ago Nausea and vomiting, unspecified vomiting type   [place], DO   5 months ago Encounter for [employer] annual wellness exam   [place] Jon Billings, NP   5 months ago Arthralgia of toe of right foot   [place], PA-C   7 months ago [place], NP       Future Appointments             Tomorrow Jon Billings, NP [place], [facility]

## 2022-04-09 ENCOUNTER — Ambulatory Visit: Payer: 59 | Admitting: Nurse Practitioner

## 2022-04-09 ENCOUNTER — Other Ambulatory Visit: Payer: Self-pay | Admitting: Nurse Practitioner

## 2022-04-10 NOTE — Telephone Encounter (Signed)
Requested Prescriptions  Pending Prescriptions Disp Refills   cloNIDine (CATAPRES) 0.1 MG tablet [Pharmacy Med Name: CLONIDINE HCL 0.1 MG TABLET] 90 tablet 0    Sig: Take 1 tablet (0.1 mg total) by mouth daily.     Cardiovascular:  Alpha-2 Agonists Passed - 04/09/2022  1:15 PM      Passed - Last BP in normal range    BP Readings from Last 1 Encounters:  03/11/22 135/71         Passed - Last Heart Rate in normal range    Pulse Readings from Last 1 Encounters:  03/11/22 (!) 101         Passed - Valid encounter within last 6 months    Recent Outpatient Visits           1 month ago Aortic atherosclerosis ([place])   [place], [place], NP   2 months ago Nausea and vomiting, unspecified vomiting type   [place] Myles Gip, DO   5 months ago Encounter for [employer] annual wellness exam   [place] Jon Billings, NP   5 months ago Arthralgia of toe of right foot   [place], PA-C   7 months ago [place] Jon Billings, NP       Future Appointments             In 2 weeks Jon Billings, NP [place], [facility]             hydrOXYzine (VISTARIL) 25 MG capsule [Pharmacy Med Name: HYDROXYZINE PAM 25 MG CAP] 90 capsule 0    Sig: TAKE 1 CAPSULE BY MOUTH THREE TIMES DAILY AS NEEDED FOR ANXIETY     Ear, Nose, and Throat:  Antihistamines 2 Passed - 04/09/2022  1:15 PM      Passed - Cr in normal range and within 360 days    Creatinine, Ser  Date Value Ref Range Status  03/11/2022 0.61 0.57 - 1.00 mg/dL Final         Passed - Valid encounter within last 12 months    Recent Outpatient Visits           1 month ago Aortic atherosclerosis ([place])   [place] Jon Billings, NP   2 months ago Nausea and vomiting, unspecified vomiting type   [place], [place], DO   5 months ago Encounter for [employer] annual wellness exam   [place] Jon Billings, NP   5 months ago Arthralgia of toe of right foot   [place], PA-C   7 months ago [place] Jon Billings, NP       Future Appointments             In 2 weeks Jon Billings, NP [place], [facility]

## 2022-04-16 ENCOUNTER — Ambulatory Visit
Admission: RE | Admit: 2022-04-16 | Discharge: 2022-04-16 | Disposition: A | Discharge status: Home / Self Care | Payer: 59 | Source: Ambulatory Visit | Attending: Nurse Practitioner | Admitting: Nurse Practitioner

## 2022-04-16 DIAGNOSIS — Z1231 Encounter for screening mammogram for malignant neoplasm of breast: Secondary | ICD-10-CM | POA: Insufficient documentation

## 2022-04-18 NOTE — Progress Notes (Signed)
Please let patient know her Mammogram did not show any evidence of a malignancy.  The recommendation is to repeat the Mammogram in 1 year.  

## 2022-04-24 ENCOUNTER — Ambulatory Visit: Payer: 59 | Admitting: Nurse Practitioner

## 2022-04-29 ENCOUNTER — Ambulatory Visit: Payer: 59 | Admitting: Nurse Practitioner

## 2022-05-02 ENCOUNTER — Other Ambulatory Visit: Payer: Self-pay | Admitting: Nurse Practitioner

## 2022-05-02 NOTE — Telephone Encounter (Signed)
Requested Prescriptions  Pending Prescriptions Disp Refills   citalopram (CELEXA) 20 MG tablet [Pharmacy Med Name: CITALOPRAM HBR 20 MG TABLET] 90 tablet 0    Sig: Take 1 tablet (20 mg total) by mouth daily.     Psychiatry:  Antidepressants - SSRI Passed - 05/02/2022  1:10 PM      Passed - Valid encounter within last 6 months    Recent Outpatient Visits           1 month ago Aortic atherosclerosis [facility])   [place] [facility] Larae Grooms, NP   2 months ago Nausea and vomiting, unspecified vomiting type   [place] [facility] Caro [place], DO   5 months ago Encounter for [employer] annual wellness exam   [facility] Larae Grooms, NP   6 months ago Arthralgia of toe of right foot   [place] [facility] Mecum, Oswaldo Conroy, PA-C   8 months ago Anxiety   [place] [facility] Larae Grooms, NP       Future Appointments             In 1 week Larae Grooms, NP  [facility], [facility]

## 2022-05-06 ENCOUNTER — Other Ambulatory Visit: Payer: Self-pay | Admitting: Nurse Practitioner

## 2022-05-07 NOTE — Telephone Encounter (Signed)
Requested medications are due for refill today.  Provider to decide  Requested medications are on the active medications list.  yes  Last refill. 01/22/2022 #30 2 rf  Future visit scheduled.   yes  Notes to clinic.  No protocol assigned. Please review for refill.    Requested Prescriptions  Pending Prescriptions Disp Refills   BELSOMRA 10 MG TABS [Pharmacy Med Name: BELSOMRA 10 MG TABLET] 30 tablet 0    Sig: Take 1 TABLET (10 mg) by mouth at bedtime.     Off-Protocol Failed - 05/06/2022  3:18 PM      Failed - Medication not assigned to a protocol, review manually.      Passed - Valid encounter within last 12 months    Recent Outpatient Visits           1 month ago Aortic atherosclerosis [facility])   [place] [facility] Larae Grooms, NP   3 months ago Nausea and vomiting, unspecified vomiting type   [place] [facility] Caro Laroche, DO   6 months ago Encounter for [employer] annual wellness exam   [place] [facility] Larae Grooms, NP   6 months ago Arthralgia of toe of right foot   [place] [facility] Mecum, Oswaldo Conroy, PA-C   8 months ago Anxiety   [place] Larae Grooms, NP       Future Appointments             In 1 week Larae Grooms, NP [facility], [facility]

## 2022-05-14 ENCOUNTER — Ambulatory Visit (INDEPENDENT_AMBULATORY_CARE_PROVIDER_SITE_OTHER): Payer: 59 | Admitting: Nurse Practitioner

## 2022-05-14 ENCOUNTER — Encounter: Payer: Self-pay | Admitting: Nurse Practitioner

## 2022-05-14 VITALS — BP 104/66 | HR 74 | Temp 98.0°F

## 2022-05-14 DIAGNOSIS — F419 Anxiety disorder, unspecified: Secondary | ICD-10-CM

## 2022-05-14 DIAGNOSIS — G8929 Other chronic pain: Secondary | ICD-10-CM | POA: Diagnosis not present

## 2022-05-14 DIAGNOSIS — M545 Low back pain, unspecified: Secondary | ICD-10-CM

## 2022-05-14 MED ORDER — TIZANIDINE HCL 4 MG PO TABS: 4.0000 mg | ORAL_TABLET | Freq: Every evening | ORAL | 2 refills | Status: AC | PRN

## 2022-05-14 MED ORDER — CITALOPRAM HYDROBROMIDE 20 MG PO TABS: 20.0000 mg | ORAL_TABLET | Freq: Every day | ORAL | 1 refills | Status: DC

## 2022-05-14 MED ORDER — TRAMADOL HCL 50 MG PO TABS: 50.0000 mg | ORAL_TABLET | Freq: Three times a day (TID) | ORAL | 0 refills | Status: AC | PRN

## 2022-05-14 NOTE — Progress Notes (Signed)
BP 104/66   Pulse 74   Temp 98 F (36.7 C) (Oral)   SpO2 95%    Subjective:    Patient ID: Marcia [name], female    DOB: Jul 16, 1950, 72 y.o.   MRN: [id number]  HPI: Marcia [name] is a 72 y.o. female  Chief Complaint  Patient presents with   Depression   Back Pain    Pt states she has been having bilateral low back pain since yesterday. States she thinks she pulled something while moving some chairs. States she has been taking ibuprofen that hasn't really helped.     ANXIETY Patient states she has been taking the Celexa and feels like it is working well for her.   She feels like her anxiety is better controlled.  Would like to continue the medication.  Does need a refill.  Flowsheet Row Office Visit from 05/14/2022 in [facility]  PHQ-9 Total Score 0         05/14/2022    3:11 PM 11/08/2021    3:56 PM 08/15/2021    4:12 PM 06/20/2021    3:57 PM  GAD 7 : Generalized Anxiety Score  Nervous, Anxious, on Edge 0 0 2 1  Control/stop worrying 0 0 1 1  Worry too much - different things 0 0 1 1  Trouble relaxing 0 0 1 1  Restless 0 0 1 0  Easily annoyed or irritable 0 0 1 0  Afraid - awful might happen 0 0 1 0  Total GAD 7 Score 0 0 8 4  Anxiety Difficulty Not difficult at all Not difficult at all Not difficult at all Not difficult at all       BACK PAIN Patient states she lifted a chair that was too heavy for her. She has been having back pain.   Duration: months Mechanism of injury: lifting Location: low back Onset: sudden Severity: 8/10 Quality: sharp and aching Frequency: constant Radiation: none Aggravating factors: movement Alleviating factors: NSAIDs Status: stable Treatments attempted: ibuprofen  Relief with NSAIDs?: mild Nighttime pain:  no Paresthesias / decreased sensation:  no Bowel / bladder incontinence:  no Fevers:  no Dysuria / urinary frequency:  no   Relevant past medical, surgical, family and social history  reviewed and updated as indicated. Interim medical history since our last visit reviewed. Allergies and medications reviewed and updated.  Review of Systems  Eyes:  Negative for visual disturbance.  Respiratory:  Negative for cough, chest tightness and shortness of breath.   Cardiovascular:  Negative for chest pain, palpitations and leg swelling.  Endocrine: Negative for polydipsia and polyuria.  Genitourinary:        Pelvic pressure  Musculoskeletal:  Positive for back pain.  Skin:  Positive for rash.  Neurological:  Negative for dizziness, numbness and headaches.  Psychiatric/Behavioral:  Positive for dysphoric mood. Negative for suicidal ideas. The patient is nervous/anxious.     Per HPI unless specifically indicated above     Objective:    BP 104/66   Pulse 74   Temp 98 F (36.7 C) (Oral)   SpO2 95%   Wt Readings from Last 3 Encounters:  03/11/22 156 lb 14.4 oz (71.2 kg)  12/09/21 160 lb (72.6 kg)  11/08/21 170 lb 14.4 oz (77.5 kg)    Physical Exam Vitals and nursing note reviewed.  Constitutional:      General: She is not in acute distress.    Appearance: Normal appearance. She is normal weight.  She is not ill-appearing, toxic-appearing or diaphoretic.  HENT:     Head: Normocephalic.     Right Ear: External ear normal.     Left Ear: External ear normal.     Nose: Nose normal.     Mouth/Throat:     Mouth: Mucous membranes are moist.     Pharynx: Oropharynx is clear.  Eyes:     General:        Right eye: No discharge.        Left eye: No discharge.     Extraocular Movements: Extraocular movements intact.     Conjunctiva/sclera: Conjunctivae normal.     Pupils: Pupils are equal, round, and reactive to light.  Cardiovascular:     Rate and Rhythm: Normal rate and regular rhythm.     Heart sounds: No murmur heard. Pulmonary:     Effort: Pulmonary effort is normal. No respiratory distress.     Breath sounds: Normal breath sounds. No wheezing or rales.   Musculoskeletal:     Cervical back: Normal range of motion and neck supple.  Skin:    General: Skin is warm and dry.     Capillary Refill: Capillary refill takes less than 2 seconds.  Neurological:     General: No focal deficit present.     Mental Status: She is alert and oriented to person, place, and time. Mental status is at baseline.  Psychiatric:        Mood and Affect: Mood normal.        Behavior: Behavior normal.        Thought Content: Thought content normal.        Judgment: Judgment normal.     Results for orders placed or performed in visit on 03/11/22  Ehrlichia antibody panel  Result Value Ref Range   E.Chaffeensis (HME) IgG Negative Neg:<1:64   E. Chaffeensis (HME) IgM Titer Negative Neg:<1:20   HGE IgG Titer Negative Neg:<1:64   HGE IgM Titer Negative Neg:<1:20   Result Comment: Comment   Spotted Fever Group Antibodies  Result Value Ref Range   Spotted Fever Group IgG <1:64 Neg:<1:64   Spotted Fever Group IgM <1:64 Neg:<1:64   Result Comment Comment   ANA+ENA+DNA/DS+Antich+Centr  Result Value Ref Range   ANA Titer 1 Negative    dsDNA Ab <1 0 - 9 IU/mL   ENA RNP Ab <0.2 0.0 - 0.9 AI   ENA SM Ab Ser-aCnc <0.2 0.0 - 0.9 AI   Scleroderma (Scl-70) (ENA) Antibody, IgG <0.2 0.0 - 0.9 AI   ENA SSA (RO) Ab <0.2 0.0 - 0.9 AI   ENA SSB (LA) Ab <0.2 0.0 - 0.9 AI   Chromatin Ab SerPl-aCnc <0.2 0.0 - 0.9 AI   Anti JO-1 <0.2 0.0 - 0.9 AI   Centromere Ab Screen <0.2 0.0 - 0.9 AI   See below: Comment   CBC with Differential/Platelet  Result Value Ref Range   WBC 7.6 3.4 - 10.8 x10E3/uL   RBC 5.45 (H) 3.77 - 5.28 x10E6/uL   Hemoglobin 15.6 11.1 - 15.9 g/dL   Hematocrit 16.1 09.6 - 46.6 %   MCV 86 79 - 97 fL   MCH 28.6 26.6 - 33.0 pg   MCHC 33.5 31.5 - 35.7 g/dL   RDW 04.5 40.9 - 81.1 %   Platelets 349 150 - 450 x10E3/uL   Neutrophils 62 Not Estab. %   Lymphs 26 Not Estab. %   Monocytes 8 Not Estab. %   Eos 3 Not Estab. %  Basos 1 Not Estab. %   Neutrophils  Absolute 4.7 1.4 - 7.0 x10E3/uL   Lymphocytes Absolute 2.0 0.7 - 3.1 x10E3/uL   Monocytes Absolute 0.6 0.1 - 0.9 x10E3/uL   EOS (ABSOLUTE) 0.2 0.0 - 0.4 x10E3/uL   Basophils Absolute 0.1 0.0 - 0.2 x10E3/uL   Immature Granulocytes 0 Not Estab. %   Immature Grans (Abs) 0.0 0.0 - 0.1 x10E3/uL  Comprehensive metabolic panel  Result Value Ref Range   Glucose 113 (H) 70 - 99 mg/dL   BUN 15 8 - 27 mg/dL   Creatinine, Ser 6.96 0.57 - 1.00 mg/dL   eGFR 96 >29 BM/WUX/3.24   BUN/Creatinine Ratio 25 12 - 28   Sodium 143 134 - 144 mmol/L   Potassium 3.2 (L) 3.5 - 5.2 mmol/L   Chloride 100 96 - 106 mmol/L   CO2 26 20 - 29 mmol/L   Calcium 9.9 8.7 - 10.3 mg/dL   Total Protein 6.7 6.0 - 8.5 g/dL   Albumin 4.4 3.9 - 4.9 g/dL   Globulin, Total 2.3 1.5 - 4.5 g/dL   Albumin/Globulin Ratio 1.9 1.2 - 2.2   Bilirubin Total 0.4 0.0 - 1.2 mg/dL   Alkaline Phosphatase 61 44 - 121 IU/L   AST 22 0 - 40 IU/L   ALT 27 0 - 32 IU/L  Lipid panel  Result Value Ref Range   Cholesterol, Total 167 100 - 199 mg/dL   Triglycerides 401 (H) 0 - 149 mg/dL   HDL 39 (L) >02 mg/dL   VLDL Cholesterol Cal 51 (H) 5 - 40 mg/dL   LDL Chol Calc (NIH) 77 0 - 99 mg/dL   Chol/HDL Ratio 4.3 0.0 - 4.4 ratio  Hemoglobin A1c  Result Value Ref Range   Hgb A1c MFr Bld 6.5 (H) 4.8 - 5.6 %   Est. average glucose Bld gHb Est-mCnc 140 mg/dL  VITAMIN D 25 Hydroxy (Vit-D Deficiency, Fractures)  Result Value Ref Range   Vit D, 25-Hydroxy 56.2 30.0 - 100.0 ng/mL  Rheumatoid factor  Result Value Ref Range   Rheumatoid fact SerPl-aCnc 10.9 <14.0 IU/mL  Uric acid  Result Value Ref Range   Uric Acid 5.1 3.0 - 7.2 mg/dL  ANA  Result Value Ref Range   Anti Nuclear Antibody (ANA) Negative Negative  Vitamin B12  Result Value Ref Range   Vitamin B-12 641 232 - 1,245 pg/mL  CK  Result Value Ref Range   Total CK 79 32 - 182 U/L      Assessment & Plan:   Problem List Items Addressed This Visit       Other   Anxiety - Primary     Chronic.  Controlled.  Continue with current medication regimen of Celexa 20mg  daily.  Refills sent today.  Return to clinic in 2 months for reevaluation.  Call sooner if concerns arise.        Relevant Medications   citalopram (CELEXA) 20 MG tablet   Chronic low back pain    Acute.  Occurred a couple of days ago.  Has been taking NSAIDS without relief.  Will send in Tizanidine for patient.  Will also send in short supply of Tramadol.  Follow up if symptoms not improved.       Relevant Medications   tiZANidine (ZANAFLEX) 4 MG tablet   traMADol (ULTRAM) 50 MG tablet   citalopram (CELEXA) 20 MG tablet     Follow up plan: Return in about 2 months (around 07/14/2022) for HTN, HLD, DM2 FU.

## 2022-05-14 NOTE — Assessment & Plan Note (Signed)
Acute.  Occurred a couple of days ago.  Has been taking NSAIDS without relief.  Will send in Tizanidine for patient.  Will also send in short supply of Tramadol.  Follow up if symptoms not improved.

## 2022-05-14 NOTE — Assessment & Plan Note (Signed)
Chronic.  Controlled.  Continue with current medication regimen of Celexa  daily.  Refills sent today.  Return to clinic in 2 months for reevaluation.  Call sooner if concerns arise.

## 2022-05-15 ENCOUNTER — Other Ambulatory Visit: Payer: Self-pay | Admitting: Nurse Practitioner

## 2022-05-15 MED ORDER — BUPROPION HCL ER (XL) 150 MG PO TB24: 150.0000 mg | ORAL_TABLET | Freq: Every day | ORAL | 1 refills | Status: DC

## 2022-05-15 NOTE — Telephone Encounter (Signed)
Medication Refill - Medication: buPROPion (WELLBUTRIN XL) 150 MG 24 hr tablet   Has the patient contacted their pharmacy? Yes.   No, more refills.   (Agent: If yes, when and what did the pharmacy advise?)  Preferred Pharmacy (with phone number or street name):  [facility] - [place], [place] - [address]  [address] [place] [address]  Phone: [phone] Fax: [phone]  Hours: Not open 24 hours   Has the patient been seen for an appointment in the last year OR does the patient have an upcoming appointment? Yes.    Agent: Please be advised that RX refills may take up to 3 business days. We ask that you follow-up with your pharmacy.

## 2022-05-15 NOTE — Telephone Encounter (Signed)
Requested medication (s) are due for refill today: na   Requested medication (s) are on the active medication list: yes   Last refill:  03/11/22 #60 0 refills  Future visit scheduled: no last OV yesterday   Notes to clinic:  no refills remain. OV note 05/14/22 patient taking celexa. Do you want to refill Rx for wellbutrin?     Requested Prescriptions  Pending Prescriptions Disp Refills   buPROPion (WELLBUTRIN XL) 150 MG 24 hr tablet 60 tablet 0    Sig: Take 1 tablet (150 mg total) by mouth daily.     Psychiatry: Antidepressants - bupropion Passed - 05/15/2022  9:35 AM      Passed - Cr in normal range and within 360 days    Creatinine, Ser  Date Value Ref Range Status  03/11/2022 0.61 0.57 - 1.00 mg/dL Final         Passed - AST in normal range and within 360 days    AST  Date Value Ref Range Status  03/11/2022 22 0 - 40 IU/L Final         Passed - ALT in normal range and within 360 days    ALT  Date Value Ref Range Status  03/11/2022 27 0 - 32 IU/L Final         Passed - Last BP in normal range    BP Readings from Last 1 Encounters:  05/14/22 104/66         Passed - Valid encounter within last 6 months    Recent Outpatient Visits           Yesterday Anxiety   [place] [facility] Larae Grooms, NP   2 months ago Aortic atherosclerosis [facility])   [place] [facility] Larae Grooms, NP   3 months ago Nausea and vomiting, unspecified vomiting type   [place] [facility] Caro Laroche, DO   6 months ago Encounter for [employer] annual wellness exam   [place] [facility] Larae Grooms, NP   6 months ago Arthralgia of toe of right foot   [place] [facility] Mecum, Oswaldo Conroy, PA-C

## 2022-06-03 ENCOUNTER — Other Ambulatory Visit: Payer: Self-pay | Admitting: Nurse Practitioner

## 2022-06-03 NOTE — Telephone Encounter (Signed)
Requested medication (s) are due for refill today: yes  Requested medication (s) are on the active medication list: yes  Last refill:  04/10/22 #90 0 refills  Future visit scheduled: no  Notes to clinic:  no refills remain. Do you want to refill Rx?     Requested Prescriptions  Pending Prescriptions Disp Refills   hydrOXYzine (VISTARIL) 25 MG capsule [Pharmacy Med Name: HYDROXYZINE PAM 25 MG CAP] 90 capsule 0    Sig: TAKE 1 CAPSULE BY MOUTH THREE TIMES DAILY AS NEEDED FOR ANXIETY     Ear, Nose, and Throat:  Antihistamines 2 Passed - 06/03/2022  8:43 AM      Passed - Cr in normal range and within 360 days    Creatinine, Ser  Date Value Ref Range Status  03/11/2022 0.61 0.57 - 1.00 mg/dL Final         Passed - Valid encounter within last 12 months    Recent Outpatient Visits           2 weeks ago Anxiety   [place] [facility] Larae Grooms, NP   2 months ago Aortic atherosclerosis [facility])   [place] [facility] Larae Grooms, NP   3 months ago Nausea and vomiting, unspecified vomiting type   [place] [facility] Caro Laroche, DO   6 months ago Encounter for [employer] annual wellness exam   [place] [facility] Larae Grooms, NP   7 months ago Arthralgia of toe of right foot   [facility] Mecum, Oswaldo Conroy, PA-C

## 2022-06-06 ENCOUNTER — Other Ambulatory Visit: Payer: Self-pay | Admitting: Nurse Practitioner

## 2022-06-06 NOTE — Telephone Encounter (Signed)
Requested Prescriptions  Pending Prescriptions Disp Refills   gabapentin (NEURONTIN) 300 MG capsule [Pharmacy Med Name: GABAPENTIN 300 MG CAPSULE] 180 capsule 0    Sig: Take 1 capsule (300 mg total) by mouth 2 (two) times daily.     Neurology: Anticonvulsants - gabapentin Passed - 06/06/2022 11:47 AM      Passed - Cr in normal range and within 360 days    Creatinine, Ser  Date Value Ref Range Status  03/11/2022 0.61 0.57 - 1.00 mg/dL Final         Passed - Completed PHQ-2 or PHQ-9 in the last 360 days      Passed - Valid encounter within last 12 months    Recent Outpatient Visits           3 weeks ago Anxiety   [place] [facility] Larae Grooms, NP   2 months ago Aortic atherosclerosis [facility])   [place] [facility] Larae Grooms, NP   4 months ago Nausea and vomiting, unspecified vomiting type   [place] [facility] Caro Laroche, DO   7 months ago Encounter for [employer] annual wellness exam   [place] [facility] Larae Grooms, NP   7 months ago Arthralgia of toe of right foot   [place] [facility] Mecum, Oswaldo Conroy, PA-C       Future Appointments             In 1 month Larae Grooms, NP [place] [facility]

## 2022-07-04 ENCOUNTER — Other Ambulatory Visit: Payer: Self-pay | Admitting: Nurse Practitioner

## 2022-07-04 NOTE — Telephone Encounter (Signed)
Requested medication (s) are due for refill today: yes   Requested medication (s) are on the active medication list: yes   Last refill:  06/04/22 #90 0 refills  Future visit scheduled: yes in 5 days   Notes to clinic:  no refills remain. Do you want to refill Rx?     Requested Prescriptions  Pending Prescriptions Disp Refills   hydrOXYzine (VISTARIL) 25 MG capsule [Pharmacy Med Name: HYDROXYZINE PAM 25 MG CAP] 90 capsule 0    Sig: TAKE 1 CAPSULE BY MOUTH THREE TIMES DAILY AS NEEDED FOR ANXIETY     Ear, Nose, and Throat:  Antihistamines 2 Passed - 07/04/2022  9:15 AM      Passed - Cr in normal range and within 360 days    Creatinine, Ser  Date Value Ref Range Status  03/11/2022 0.61 0.57 - 1.00 mg/dL Final         Passed - Valid encounter within last 12 months    Recent Outpatient Visits           1 month ago Anxiety   [place] [facility] Larae Grooms, NP   3 months ago Aortic atherosclerosis [facility])   [place] [facility] Larae Grooms, NP   4 months ago Nausea and vomiting, unspecified vomiting type   [place] [facility] Caro Laroche, DO   7 months ago Encounter for [employer] annual wellness exam   [place] [facility] Larae Grooms, NP   8 months ago Arthralgia of toe of right foot   [place] [facility] Mecum, Oswaldo Conroy, PA-C       Future Appointments             In 5 days Larae Grooms, NP [place] [facility]

## 2022-07-09 ENCOUNTER — Encounter: Payer: Self-pay | Admitting: Nurse Practitioner

## 2022-07-09 ENCOUNTER — Ambulatory Visit (INDEPENDENT_AMBULATORY_CARE_PROVIDER_SITE_OTHER): Payer: 59 | Admitting: Nurse Practitioner

## 2022-07-09 VITALS — BP 138/81 | HR 96 | Temp 98.0°F | Wt 165.0 lb

## 2022-07-09 DIAGNOSIS — F419 Anxiety disorder, unspecified: Secondary | ICD-10-CM | POA: Diagnosis not present

## 2022-07-09 DIAGNOSIS — E1169 Type 2 diabetes mellitus with other specified complication: Secondary | ICD-10-CM

## 2022-07-09 DIAGNOSIS — I152 Hypertension secondary to endocrine disorders: Secondary | ICD-10-CM

## 2022-07-09 DIAGNOSIS — Z7984 Long term (current) use of oral hypoglycemic drugs: Secondary | ICD-10-CM

## 2022-07-09 DIAGNOSIS — E1159 Type 2 diabetes mellitus with other circulatory complications: Secondary | ICD-10-CM

## 2022-07-09 DIAGNOSIS — E785 Hyperlipidemia, unspecified: Secondary | ICD-10-CM

## 2022-07-09 DIAGNOSIS — I700 Atherosclerosis of aorta: Secondary | ICD-10-CM | POA: Diagnosis not present

## 2022-07-09 MED ORDER — METFORMIN HCL 500 MG PO TABS: 500.0000 mg | ORAL_TABLET | Freq: Two times a day (BID) | ORAL | 1 refills | Status: DC

## 2022-07-09 MED ORDER — CITALOPRAM HYDROBROMIDE 20 MG PO TABS: 20.0000 mg | ORAL_TABLET | Freq: Every day | ORAL | 1 refills | Status: DC

## 2022-07-09 MED ORDER — CLONIDINE HCL 0.1 MG PO TABS: 0.1000 mg | ORAL_TABLET | Freq: Every day | ORAL | 1 refills | Status: DC

## 2022-07-09 MED ORDER — BUPROPION HCL ER (XL) 150 MG PO TB24: 150.0000 mg | ORAL_TABLET | Freq: Every day | ORAL | 1 refills | Status: DC

## 2022-07-09 MED ORDER — PRAVASTATIN SODIUM 40 MG PO TABS: 40.0000 mg | ORAL_TABLET | Freq: Every day | ORAL | 1 refills | Status: DC

## 2022-07-09 MED ORDER — GABAPENTIN 100 MG PO CAPS: 100.0000 mg | ORAL_CAPSULE | Freq: Every day | ORAL | 1 refills | Status: DC

## 2022-07-09 MED ORDER — GABAPENTIN 300 MG PO CAPS: 300.0000 mg | ORAL_CAPSULE | Freq: Two times a day (BID) | ORAL | 1 refills | Status: DC

## 2022-07-09 MED ORDER — BELSOMRA 10 MG PO TABS: ORAL_TABLET | ORAL | 2 refills | Status: DC

## 2022-07-09 MED ORDER — HYDROCHLOROTHIAZIDE 25 MG PO TABS: 25.0000 mg | ORAL_TABLET | Freq: Every day | ORAL | 1 refills | Status: DC

## 2022-07-09 NOTE — Assessment & Plan Note (Signed)
Chronic.  Controlled.  Last A1c 6.5%.  Continue with current medication regimen of Metformin 500mg  BID.  Labs ordered today. Will make further recommendations based on lab results. Continue to check blood pressures at home. If consistently elevated >140 call the office.  Refills sent today.  Return to clinic in 4 months for reevaluation.  Call sooner if concerns arise.

## 2022-07-09 NOTE — Assessment & Plan Note (Signed)
Chronic.  Controlled.  Continue with current medication regimen on Pravastatin 40mg .  Refills sent today.  Labs ordered today.  Return to clinic in 4 months for reevaluation.  Call sooner if concerns arise.

## 2022-07-09 NOTE — Assessment & Plan Note (Signed)
Chronic.  Controlled.  Continue with current medication regimen of Celexa 20mg  and Wellbutrin 150mg  daily.  Refills sent today.  Return to clinic in 4 months for reevaluation.  Call sooner if concerns arise.

## 2022-07-09 NOTE — Assessment & Plan Note (Signed)
Chronic.  Controlled.  Continue with current medication regimen of Pravastatin 40mg .  Refills sent today.  Labs ordered today.  Return to clinic in 4 months for reevaluation.  Call sooner if concerns arise.

## 2022-07-09 NOTE — Progress Notes (Signed)
BP 138/81   Pulse 96   Temp 98 F (36.7 C) (Oral)   Wt 165 lb (74.8 kg)   SpO2 97%   BMI 24.37 kg/m    Subjective:    Patient ID: Marcia [name], female    DOB: 03-31-1950, 72 y.o.   MRN: [id number]  HPI: Marcia [name] is a 72 y.o. female  Chief Complaint  Patient presents with   Anxiety   HYPERTENSION / HYPERLIPIDEMIA Satisfied with current treatment? yes Duration of hypertension: years BP monitoring frequency: daily BP range: 135/80s BP medication side effects: no Past BP meds: clonidine and HCTZ Duration of hyperlipidemia: years Cholesterol medication side effects: no Cholesterol supplements: none Past cholesterol medications: pravastatin (pravachol) Medication compliance: excellent compliance Aspirin: no Recent stressors: no Recurrent headaches: no Visual changes: no Palpitations: no Dyspnea: no Chest pain: no Lower extremity edema: no Dizzy/lightheaded: no  DIABETES Hypoglycemic episodes:no Polydipsia/polyuria: no Visual disturbance: no Chest pain: no Paresthesias: no Glucose Monitoring: yes  Accucheck frequency: Daily  Fasting glucose:130  Post prandial:  Evening:  Before meals: Taking Insulin?: no  Long acting insulin:  Short acting insulin: Blood Pressure Monitoring: daily Retinal Examination: Up to Date Foot Exam: Up to Date Diabetic Education: Not Completed Pneumovax: Up to Date Influenza: Not up to Date Aspirin: no  ANXIETY Patient states the Wellbutrin 150mg  and Celexa are working well for her.  Denies concerns at visit today.  Likes the current doses of medications.      Relevant past medical, surgical, family and social history reviewed and updated as indicated. Interim medical history since our last visit reviewed. Allergies and medications reviewed and updated.  Review of Systems  Eyes:  Negative for visual disturbance.  Respiratory:  Negative for cough, chest tightness and shortness of breath.   Cardiovascular:   Negative for chest pain, palpitations and leg swelling.  Endocrine: Negative for polydipsia and polyuria.  Genitourinary:        Pelvic pressure  Neurological:  Negative for dizziness, numbness and headaches.  Psychiatric/Behavioral:  Positive for dysphoric mood. Negative for suicidal ideas. The patient is nervous/anxious.     Per HPI unless specifically indicated above     Objective:    BP 138/81   Pulse 96   Temp 98 F (36.7 C) (Oral)   Wt 165 lb (74.8 kg)   SpO2 97%   BMI 24.37 kg/m   Wt Readings from Last 3 Encounters:  07/09/22 165 lb (74.8 kg)  03/11/22 156 lb 14.4 oz (71.2 kg)  12/09/21 160 lb (72.6 kg)    Physical Exam Vitals and nursing note reviewed.  Constitutional:      General: She is not in acute distress.    Appearance: Normal appearance. She is normal weight. She is not ill-appearing, toxic-appearing or diaphoretic.  HENT:     Head: Normocephalic.     Right Ear: External ear normal.     Left Ear: External ear normal.     Nose: Nose normal.     Mouth/Throat:     Mouth: Mucous membranes are moist.     Pharynx: Oropharynx is clear.  Eyes:     General:        Right eye: No discharge.        Left eye: No discharge.     Extraocular Movements: Extraocular movements intact.     Conjunctiva/sclera: Conjunctivae normal.     Pupils: Pupils are equal, round, and reactive to light.  Cardiovascular:     Rate and Rhythm:  Normal rate and regular rhythm.     Heart sounds: No murmur heard. Pulmonary:     Effort: Pulmonary effort is normal. No respiratory distress.     Breath sounds: Normal breath sounds. No wheezing or rales.  Musculoskeletal:     Cervical back: Normal range of motion and neck supple.  Skin:    General: Skin is warm and dry.     Capillary Refill: Capillary refill takes less than 2 seconds.  Neurological:     General: No focal deficit present.     Mental Status: She is alert and oriented to person, place, and time. Mental status is at baseline.   Psychiatric:        Mood and Affect: Mood normal.        Behavior: Behavior normal.        Thought Content: Thought content normal.        Judgment: Judgment normal.     Results for orders placed or performed in visit on 03/11/22  Ehrlichia antibody panel  Result Value Ref Range   E.Chaffeensis (HME) IgG Negative Neg:<1:64   E. Chaffeensis (HME) IgM Titer Negative Neg:<1:20   HGE IgG Titer Negative Neg:<1:64   HGE IgM Titer Negative Neg:<1:20   Result Comment: Comment   Spotted Fever Group Antibodies  Result Value Ref Range   Spotted Fever Group IgG <1:64 Neg:<1:64   Spotted Fever Group IgM <1:64 Neg:<1:64   Result Comment Comment   ANA+ENA+DNA/DS+Antich+Centr  Result Value Ref Range   ANA Titer 1 Negative    dsDNA Ab <1 0 - 9 IU/mL   ENA RNP Ab <0.2 0.0 - 0.9 AI   ENA SM Ab Ser-aCnc <0.2 0.0 - 0.9 AI   Scleroderma (Scl-70) (ENA) Antibody, IgG <0.2 0.0 - 0.9 AI   ENA SSA (RO) Ab <0.2 0.0 - 0.9 AI   ENA SSB (LA) Ab <0.2 0.0 - 0.9 AI   Chromatin Ab SerPl-aCnc <0.2 0.0 - 0.9 AI   Anti JO-1 <0.2 0.0 - 0.9 AI   Centromere Ab Screen <0.2 0.0 - 0.9 AI   See below: Comment   CBC with Differential/Platelet  Result Value Ref Range   WBC 7.6 3.4 - 10.8 x10E3/uL   RBC 5.45 (H) 3.77 - 5.28 x10E6/uL   Hemoglobin 15.6 11.1 - 15.9 g/dL   Hematocrit 16.1 09.6 - 46.6 %   MCV 86 79 - 97 fL   MCH 28.6 26.6 - 33.0 pg   MCHC 33.5 31.5 - 35.7 g/dL   RDW 04.5 40.9 - 81.1 %   Platelets 349 150 - 450 x10E3/uL   Neutrophils 62 Not Estab. %   Lymphs 26 Not Estab. %   Monocytes 8 Not Estab. %   Eos 3 Not Estab. %   Basos 1 Not Estab. %   Neutrophils Absolute 4.7 1.4 - 7.0 x10E3/uL   Lymphocytes Absolute 2.0 0.7 - 3.1 x10E3/uL   Monocytes Absolute 0.6 0.1 - 0.9 x10E3/uL   EOS (ABSOLUTE) 0.2 0.0 - 0.4 x10E3/uL   Basophils Absolute 0.1 0.0 - 0.2 x10E3/uL   Immature Granulocytes 0 Not Estab. %   Immature Grans (Abs) 0.0 0.0 - 0.1 x10E3/uL  Comprehensive metabolic panel  Result Value Ref  Range   Glucose 113 (H) 70 - 99 mg/dL   BUN 15 8 - 27 mg/dL   Creatinine, Ser 9.14 0.57 - 1.00 mg/dL   eGFR 96 >78 GN/FAO/1.30   BUN/Creatinine Ratio 25 12 - 28   Sodium 143 134 - 144 mmol/L  Potassium 3.2 (L) 3.5 - 5.2 mmol/L   Chloride 100 96 - 106 mmol/L   CO2 26 20 - 29 mmol/L   Calcium 9.9 8.7 - 10.3 mg/dL   Total Protein 6.7 6.0 - 8.5 g/dL   Albumin 4.4 3.9 - 4.9 g/dL   Globulin, Total 2.3 1.5 - 4.5 g/dL   Albumin/Globulin Ratio 1.9 1.2 - 2.2   Bilirubin Total 0.4 0.0 - 1.2 mg/dL   Alkaline Phosphatase 61 44 - 121 IU/L   AST 22 0 - 40 IU/L   ALT 27 0 - 32 IU/L  Lipid panel  Result Value Ref Range   Cholesterol, Total 167 100 - 199 mg/dL   Triglycerides 161 (H) 0 - 149 mg/dL   HDL 39 (L) >09 mg/dL   VLDL Cholesterol Cal 51 (H) 5 - 40 mg/dL   LDL Chol Calc (NIH) 77 0 - 99 mg/dL   Chol/HDL Ratio 4.3 0.0 - 4.4 ratio  Hemoglobin A1c  Result Value Ref Range   Hgb A1c MFr Bld 6.5 (H) 4.8 - 5.6 %   Est. average glucose Bld gHb Est-mCnc 140 mg/dL  VITAMIN D 25 Hydroxy (Vit-D Deficiency, Fractures)  Result Value Ref Range   Vit D, 25-Hydroxy 56.2 30.0 - 100.0 ng/mL  Rheumatoid factor  Result Value Ref Range   Rheumatoid fact SerPl-aCnc 10.9 <14.0 IU/mL  Uric acid  Result Value Ref Range   Uric Acid 5.1 3.0 - 7.2 mg/dL  ANA  Result Value Ref Range   Anti Nuclear Antibody (ANA) Negative Negative  Vitamin B12  Result Value Ref Range   Vitamin B-12 641 232 - 1,245 pg/mL  CK  Result Value Ref Range   Total CK 79 32 - 182 U/L      Assessment & Plan:   Problem List Items Addressed This Visit       Cardiovascular and Mediastinum   Hypertension associated with diabetes (HCC)    Chronic.  Controlled.  Continue with current medication regimen of HCTZ and Clonidine.  Refills sent today.  Labs ordered today. Will make further recommendations based on lab results. Continue to check blood pressures at home. If consistently elevated >140 call the office.  Return to clinic in 4  months for reevaluation.  Call sooner if concerns arise.        Relevant Medications   cloNIDine (CATAPRES) 0.1 MG tablet   hydrochlorothiazide (HYDRODIURIL) 25 MG tablet   metFORMIN (GLUCOPHAGE) 500 MG tablet   pravastatin (PRAVACHOL) 40 MG tablet   Other Relevant Orders   HgB A1c   Aortic atherosclerosis (HCC)    Chronic.  Controlled.  Continue with current medication regimen on Pravastatin 40mg .  Refills sent today.  Labs ordered today.  Return to clinic in 4 months for reevaluation.  Call sooner if concerns arise.        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  Controlled.  Last A1c 6.5%.  Continue with current medication regimen of Metformin 500mg  BID.  Labs ordered today. Will make further recommendations based on lab results. Continue to check blood pressures at home. If consistently elevated >140 call the office.  Refills sent today.  Return to clinic in 4 months for reevaluation.  Call sooner if concerns arise.        Relevant Medications   cloNIDine (  CATAPRES) 0.1 MG tablet   hydrochlorothiazide (HYDRODIURIL) 25 MG tablet   metFORMIN (GLUCOPHAGE) 500 MG tablet   pravastatin (PRAVACHOL) 40 MG tablet   Other Relevant Orders   Comp Met (CMET)   HgB A1c     Endocrine   Hyperlipidemia associated with type 2 diabetes mellitus (HCC)    Chronic.  Controlled.  Continue with current medication regimen of Pravastatin 40mg .  Refills sent today.  Labs ordered today.  Return to clinic in 4 months for reevaluation.  Call sooner if concerns arise.        Relevant Medications   cloNIDine (CATAPRES) 0.1 MG tablet   hydrochlorothiazide (HYDRODIURIL) 25 MG tablet   metFORMIN (GLUCOPHAGE) 500 MG tablet   pravastatin (PRAVACHOL) 40 MG tablet   Other Relevant Orders   Lipid Profile      Other   Anxiety    Chronic.  Controlled.  Continue with current medication regimen of Celexa 20mg  and Wellbutrin 150mg  daily.  Refills sent today.  Return to clinic in 4 months for reevaluation.  Call sooner if concerns arise.        Relevant Medications   buPROPion (WELLBUTRIN XL) 150 MG 24 hr tablet   citalopram (CELEXA) 20 MG tablet     Follow up plan: Return in about 4 months (around 11/08/2022) for HTN, HLD, DM2 FU.

## 2022-07-09 NOTE — Assessment & Plan Note (Signed)
Chronic.  Controlled.  Continue with current medication regimen of HCTZ and Clonidine.  Refills sent today.  Labs ordered today. Will make further recommendations based on lab results. Continue to check blood pressures at home. If consistently elevated >140 call the office.  Return to clinic in 4 months for reevaluation.  Call sooner if concerns arise.

## 2022-07-10 ENCOUNTER — Telehealth: Payer: Self-pay

## 2022-07-10 DIAGNOSIS — E1159 Type 2 diabetes mellitus with other circulatory complications: Secondary | ICD-10-CM

## 2022-07-10 LAB — LIPID PANEL
Chol/HDL Ratio: 4.7 ratio — ABNORMAL HIGH (ref 0.0–4.4)
Cholesterol, Total: 149 mg/dL (ref 100–199)
HDL: 32 mg/dL — ABNORMAL LOW (ref >39)
LDL Chol Calc (NIH): 41 mg/dL (ref 0–99)
Triglycerides: 534 mg/dL — ABNORMAL HIGH (ref 0–149)
VLDL Cholesterol Cal: 76 mg/dL — ABNORMAL HIGH (ref 5–40)

## 2022-07-10 LAB — COMPREHENSIVE METABOLIC PANEL
ALT: 44 IU/L — ABNORMAL HIGH (ref 0–32)
AST: 28 IU/L (ref 0–40)
Albumin: 4.2 g/dL (ref 3.8–4.8)
Alkaline Phosphatase: 66 IU/L (ref 44–121)
BUN/Creatinine Ratio: 17 (ref 12–28)
BUN: 12 mg/dL (ref 8–27)
Bilirubin Total: 0.3 mg/dL (ref 0.0–1.2)
CO2: 24 mmol/L (ref 20–29)
Calcium: 9.4 mg/dL (ref 8.7–10.3)
Chloride: 100 mmol/L (ref 96–106)
Creatinine, Ser: 0.69 mg/dL (ref 0.57–1.00)
Globulin, Total: 2.2 g/dL (ref 1.5–4.5)
Glucose: 194 mg/dL — ABNORMAL HIGH (ref 70–99)
Potassium: 3.2 mmol/L — ABNORMAL LOW (ref 3.5–5.2)
Sodium: 143 mmol/L (ref 134–144)
Total Protein: 6.4 g/dL (ref 6.0–8.5)
eGFR: 93 mL/min/{1.73_m2} (ref >59)

## 2022-07-10 LAB — HEMOGLOBIN A1C
Est. average glucose Bld gHb Est-mCnc: 169 mg/dL
Hgb A1c MFr Bld: 7.5 % — ABNORMAL HIGH (ref 4.8–5.6)

## 2022-07-10 MED ORDER — METFORMIN HCL 500 MG PO TABS: 1000.0000 mg | ORAL_TABLET | Freq: Two times a day (BID) | ORAL | 1 refills | Status: DC

## 2022-07-10 NOTE — Addendum Note (Signed)
Addended by: Larae Grooms on: 07/10/2022 01:35 PM   Modules accepted: Orders

## 2022-07-10 NOTE — Telephone Encounter (Signed)
Pt given lab results per notes of PCP on 07/10/22. Pt verbalized understanding. Pt is ok with icreasing dose of Metformin. Lab appt ordered. Please enter order for potassium in chart as per note.

## 2022-07-10 NOTE — Progress Notes (Signed)
Please let patient know that her lab work shows that her potassium is low.  I recommend she increase her intake of potassium rich foods such as bananas, citrus and potatoes.  I want her to come back in 1 month as a lab visit and have this repeated.  Her Triglycerides are higher than they have been.  I recommend she decrease processed foods and refined sugar intake.   Her A1c increased to 7.5%.  I recommend she increase her Metformin to 1000mg  twice daily.  I can change the prescription for her if she agrees.

## 2022-07-10 NOTE — Telephone Encounter (Signed)
Medication sent to the pharmacy.

## 2022-07-25 ENCOUNTER — Encounter: Payer: Self-pay | Admitting: Emergency Medicine

## 2022-07-25 ENCOUNTER — Emergency Department
Admission: EM | Admit: 2022-07-25 | Discharge: 2022-07-25 | Disposition: A | Discharge status: Home / Self Care | Payer: 59 | Attending: Student in an Organized Health Care Education/Training Program | Admitting: Student in an Organized Health Care Education/Training Program

## 2022-07-25 ENCOUNTER — Other Ambulatory Visit: Payer: Self-pay

## 2022-07-25 ENCOUNTER — Emergency Department: Payer: 59

## 2022-07-25 DIAGNOSIS — A020 Salmonella enteritis: Secondary | ICD-10-CM | POA: Insufficient documentation

## 2022-07-25 DIAGNOSIS — I10 Essential (primary) hypertension: Secondary | ICD-10-CM | POA: Insufficient documentation

## 2022-07-25 DIAGNOSIS — R109 Unspecified abdominal pain: Secondary | ICD-10-CM | POA: Diagnosis not present

## 2022-07-25 DIAGNOSIS — A071 Giardiasis [lambliasis]: Secondary | ICD-10-CM | POA: Diagnosis not present

## 2022-07-25 DIAGNOSIS — E876 Hypokalemia: Secondary | ICD-10-CM | POA: Insufficient documentation

## 2022-07-25 DIAGNOSIS — Z20822 Contact with and (suspected) exposure to covid-19: Secondary | ICD-10-CM | POA: Insufficient documentation

## 2022-07-25 DIAGNOSIS — R197 Diarrhea, unspecified: Secondary | ICD-10-CM | POA: Diagnosis present

## 2022-07-25 LAB — GASTROINTESTINAL PANEL BY PCR, STOOL (REPLACES STOOL CULTURE)
Adenovirus F40/41: NOT DETECTED
Astrovirus: NOT DETECTED
Campylobacter species: NOT DETECTED
Cryptosporidium: NOT DETECTED
Cyclospora cayetanensis: NOT DETECTED
Entamoeba histolytica: NOT DETECTED
Enteroaggregative E coli (EAEC): NOT DETECTED
Enteropathogenic E coli (EPEC): NOT DETECTED
Enterotoxigenic E coli (ETEC): NOT DETECTED
Giardia lamblia: DETECTED — AB
Norovirus GI/GII: NOT DETECTED
Plesimonas shigelloides: NOT DETECTED
Rotavirus A: NOT DETECTED
Salmonella species: DETECTED — AB
Sapovirus (I, II, IV, and V): NOT DETECTED
Shiga like toxin producing E coli (STEC): NOT DETECTED
Shigella/Enteroinvasive E coli (EIEC): NOT DETECTED
Vibrio cholerae: NOT DETECTED
Vibrio species: NOT DETECTED
Yersinia enterocolitica: NOT DETECTED

## 2022-07-25 LAB — COMPREHENSIVE METABOLIC PANEL
ALT: 35 U/L (ref 0–44)
AST: 28 U/L (ref 15–41)
Albumin: 4.3 g/dL (ref 3.5–5.0)
Alkaline Phosphatase: 47 U/L (ref 38–126)
Anion gap: 12 (ref 5–15)
BUN: 20 mg/dL (ref 8–23)
CO2: 27 mmol/L (ref 22–32)
Calcium: 8.9 mg/dL (ref 8.9–10.3)
Chloride: 96 mmol/L — ABNORMAL LOW (ref 98–111)
Creatinine, Ser: 0.77 mg/dL (ref 0.44–1.00)
GFR, Estimated: >60 mL/min (ref >60)
Glucose, Bld: 117 mg/dL — ABNORMAL HIGH (ref 70–99)
Potassium: 3.2 mmol/L — ABNORMAL LOW (ref 3.5–5.1)
Sodium: 135 mmol/L (ref 135–145)
Total Bilirubin: 0.9 mg/dL (ref 0.3–1.2)
Total Protein: 7.6 g/dL (ref 6.5–8.1)

## 2022-07-25 LAB — CBC
HCT: 45.6 % (ref 36.0–46.0)
Hemoglobin: 15.2 g/dL — ABNORMAL HIGH (ref 12.0–15.0)
MCH: 28.4 pg (ref 26.0–34.0)
MCHC: 33.3 g/dL (ref 30.0–36.0)
MCV: 85.2 fL (ref 80.0–100.0)
Platelets: 323 10*3/uL (ref 150–400)
RBC: 5.35 MIL/uL — ABNORMAL HIGH (ref 3.87–5.11)
RDW: 13.2 % (ref 11.5–15.5)
WBC: 7.1 10*3/uL (ref 4.0–10.5)
nRBC: 0 % (ref 0.0–0.2)

## 2022-07-25 LAB — URINALYSIS, ROUTINE W REFLEX MICROSCOPIC
Bilirubin Urine: NEGATIVE
Glucose, UA: NEGATIVE mg/dL
Hgb urine dipstick: NEGATIVE
Ketones, ur: NEGATIVE mg/dL
Leukocytes,Ua: NEGATIVE
Nitrite: NEGATIVE
Protein, ur: NEGATIVE mg/dL
Specific Gravity, Urine: >1.046 — ABNORMAL HIGH (ref 1.005–1.030)
pH: 5 (ref 5.0–8.0)

## 2022-07-25 LAB — C DIFFICILE QUICK SCREEN W PCR REFLEX
C Diff antigen: NEGATIVE
C Diff interpretation: NOT DETECTED
C Diff toxin: NEGATIVE

## 2022-07-25 LAB — SARS CORONAVIRUS 2 BY RT PCR: SARS Coronavirus 2 by RT PCR: NEGATIVE

## 2022-07-25 LAB — LIPASE, BLOOD: Lipase: 34 U/L (ref 11–51)

## 2022-07-25 MED ORDER — LEVOFLOXACIN 750 MG PO TABS: 750.0000 mg | ORAL_TABLET | Freq: Every day | ORAL | 0 refills | Status: AC

## 2022-07-25 MED ORDER — SODIUM CHLORIDE 0.9 % IV BOLUS
500.0000 mL | Freq: Once | INTRAVENOUS | Status: AC
  Administered 2022-07-25: 500 mL via INTRAVENOUS

## 2022-07-25 MED ORDER — SODIUM CHLORIDE 0.9 % IV BOLUS
1000.0000 mL | Freq: Once | INTRAVENOUS | Status: AC
  Administered 2022-07-25: 1000 mL via INTRAVENOUS

## 2022-07-25 MED ORDER — ONDANSETRON 4 MG PO TBDP: 4.0000 mg | ORAL_TABLET | Freq: Three times a day (TID) | ORAL | 0 refills | Status: DC | PRN

## 2022-07-25 MED ORDER — SODIUM CHLORIDE 0.9 % IV SOLN
2.0000 g | Freq: Once | INTRAVENOUS | Status: AC
  Administered 2022-07-25: 2 g via INTRAVENOUS
  Filled 2022-07-25: qty 20

## 2022-07-25 MED ORDER — POTASSIUM CHLORIDE CRYS ER 20 MEQ PO TBCR
20.0000 meq | EXTENDED_RELEASE_TABLET | Freq: Once | ORAL | Status: AC
  Administered 2022-07-25: 20 meq via ORAL
  Filled 2022-07-25: qty 1

## 2022-07-25 MED ORDER — IOHEXOL 300 MG/ML  SOLN
100.0000 mL | Freq: Once | INTRAMUSCULAR | Status: AC | PRN
  Administered 2022-07-25: 100 mL via INTRAVENOUS

## 2022-07-25 MED ORDER — METRONIDAZOLE 500 MG/100ML IV SOLN
500.0000 mg | Freq: Once | INTRAVENOUS | Status: AC
  Administered 2022-07-25: 500 mg via INTRAVENOUS
  Filled 2022-07-25: qty 100

## 2022-07-25 MED ORDER — METRONIDAZOLE 500 MG PO TABS: 500.0000 mg | ORAL_TABLET | Freq: Three times a day (TID) | ORAL | 0 refills | Status: AC

## 2022-07-25 NOTE — ED Provider Notes (Signed)
[facility] Provider Note    Event Date/Time   First MD Initiated Contact with Patient 07/25/22 [phone]     (approximate)   History   Diarrhea   HPI  Marcia [name] is a 72 y.o. female with history of hypertension, perforated bowel, hyperlipidemia and anxiety presents emergency department with concerns of diarrhea for 1 week.  States that she was seen at urgent care and was told to stop taking Pepto-Bismol as the stool was very dark in color.  States that his started to lighten up and is more brown now.  Patient does have some body aches and just feels very weak secondary to losing so much fluid.  She states she is gone 3 times today.  Multiple times every day for 1 week.  No recent antibiotics.  No recent travel.  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  States she is unaware if they have been sick or if there has been an outbreak in the facility for C. difficile      Physical Exam   Triage Vital Signs: ED Triage Vitals [07/25/22 0855]  Enc Vitals Group     BP 137/75     Pulse Rate 92     Resp 18     Temp 98.8 F (37.1 C)     Temp src      SpO2 93 %     Weight 150 lb (68 kg)     Height 5\' 8"  (1.727 m)     Head Circumference      Peak Flow      Pain Score      Pain Loc      Pain Edu?      Excl. in GC?     Most recent vital signs: Vitals:   07/25/22 0855 07/25/22 1246  BP: 137/75 130/70  Pulse: 92 88  Resp: 18 18  Temp: 98.8 F (37.1 C) 98 F (36.7 C)  SpO2: 93% 96%     General: Awake, no distress.   CV:  Good peripheral perfusion. regular rate and  rhythm Resp:  Normal effort. Lungs cta Abd:  No distention.  Nontender Other:     ED Results / Procedures / Treatments   Labs (all labs ordered are listed, but only abnormal results are displayed) Labs Reviewed  GASTROINTESTINAL PANEL BY PCR, STOOL (REPLACES STOOL CULTURE) - Abnormal; Notable for the following components:      Result Value    Salmonella species DETECTED (*)    Giardia lamblia DETECTED (*)    All other components within normal limits  COMPREHENSIVE METABOLIC PANEL - Abnormal; Notable for the following components:   Potassium 3.2 (*)    Chloride 96 (*)    Glucose, Bld 117 (*)    All other components within normal limits  CBC - Abnormal; Notable for the following components:   RBC 5.35 (*)    Hemoglobin 15.2 (*)    All other components within normal limits  URINALYSIS, ROUTINE W REFLEX MICROSCOPIC - Abnormal; Notable for the following components:   Color, Urine STRAW (*)    APPearance CLEAR (*)    Specific Gravity, Urine >1.046 (*)    Bacteria, UA RARE (*)    All other components within normal limits  C DIFFICILE QUICK SCREEN W PCR REFLEX    SARS CORONAVIRUS 2 BY RT PCR  LIPASE, BLOOD  MISCELLANEOUS TEST     EKG     RADIOLOGY CT abdomen  pelvis IV contrast    PROCEDURES:   Procedures   MEDICATIONS ORDERED IN ED: Medications  sodium chloride 0.9 % bolus 1,000 mL (0 mLs Intravenous Stopped 07/25/22 1109)  iohexol (OMNIPAQUE) 300 MG/ML solution 100 mL (100 mLs Intravenous Contrast Given 07/25/22 1117)  sodium chloride 0.9 % bolus 500 mL (0 mLs Intravenous Stopped 07/25/22 1236)  cefTRIAXone (ROCEPHIN) 2 g in sodium chloride 0.9 % 100 mL IVPB (0 g Intravenous Stopped 07/25/22 1236)  metroNIDAZOLE (FLAGYL) IVPB 500 mg (500 mg Intravenous New Bag/Given 07/25/22 1242)  potassium chloride SA (KLOR-CON M) CR tablet 20 mEq (20 mEq Oral Given 07/25/22 1305)     IMPRESSION / MDM / ASSESSMENT AND PLAN / ED COURSE  I reviewed the triage vital signs and the nursing notes.                              Differential diagnosis includes, but is not limited to, gastroenteritis, colitis, enteritis, diverticulitis, covid, C. difficile, dehydration  Patient's presentation is most consistent with acute presentation with potential threat to life or bodily function.   Labs, fluids ordered   Stool culture is positive  for Salmonella and Giardia, this would most likely be the cause of her diarrhea  Remainder of the patient's labs are reassuring.  Her potassium is at 3.2 which has been this way for 4 months.  However due to the amount of diarrhea we will go ahead and give her 20 mEq of potassium while here in the ED.  She is also given Rocephin IV and Flagyl IV prior to discharge.  At discharge she was given a prescription for Levaquin and Flagyl.  She was given strict instructions to return the emergency department if worsening.  Follow-up with her regular doctor if not improving in 3 days.  Use over-the-counter Imodium A-D for diarrhea.  Encouraged her to drink plenty of fluids.  She is in agreement treatment plan.  Discharged in stable condition.   FINAL CLINICAL IMPRESSION(S) / ED DIAGNOSES   Final diagnoses:  Salmonella enteritis  Giardial enteritis     Rx / DC Orders   ED Discharge Orders          Ordered    levofloxacin (LEVAQUIN) 750 MG tablet  Daily        07/25/22 1220    metroNIDAZOLE (FLAGYL) 500 MG tablet  3 times daily        07/25/22 1220    ondansetron (ZOFRAN-ODT) 4 MG disintegrating tablet  Every 8 hours PRN        07/25/22 1220             Note:  This document was prepared using Dragon voice recognition software and may include unintentional dictation errors.    Faythe Ghee, PA-C 07/25/22 1346    Willy Eddy, MD 07/25/22 [phone]

## 2022-07-25 NOTE — Discharge Instructions (Signed)
Follow up with your regular doctor if not improving in 3 days Return if worsening Take the medication as prescribed 

## 2022-07-25 NOTE — ED Triage Notes (Signed)
Pt states coming in with diarrhea x3 days. Pt seen at urgent care 3 days ago and told to come to the ED. Pt states it got better and then worse. Pt states diarrhea was dark in color 3 days ago, but is now brown in color, and liquid. Pt denies pain

## 2022-08-05 ENCOUNTER — Other Ambulatory Visit: Payer: Self-pay

## 2022-08-05 ENCOUNTER — Other Ambulatory Visit: Payer: Self-pay | Admitting: Nurse Practitioner

## 2022-08-05 ENCOUNTER — Emergency Department
Admission: EM | Admit: 2022-08-05 | Discharge: 2022-08-05 | Disposition: A | Discharge status: Home / Self Care | Payer: 59 | Source: Home / Self Care | Attending: Emergency Medicine | Admitting: Emergency Medicine

## 2022-08-05 ENCOUNTER — Other Ambulatory Visit: Payer: 59

## 2022-08-05 ENCOUNTER — Ambulatory Visit: Payer: Self-pay | Admitting: *Deleted

## 2022-08-05 ENCOUNTER — Telehealth: Payer: Self-pay

## 2022-08-05 DIAGNOSIS — R718 Other abnormality of red blood cells: Secondary | ICD-10-CM | POA: Diagnosis not present

## 2022-08-05 DIAGNOSIS — E876 Hypokalemia: Secondary | ICD-10-CM | POA: Insufficient documentation

## 2022-08-05 DIAGNOSIS — K529 Noninfective gastroenteritis and colitis, unspecified: Secondary | ICD-10-CM

## 2022-08-05 DIAGNOSIS — I10 Essential (primary) hypertension: Secondary | ICD-10-CM | POA: Diagnosis not present

## 2022-08-05 DIAGNOSIS — R197 Diarrhea, unspecified: Secondary | ICD-10-CM | POA: Insufficient documentation

## 2022-08-05 DIAGNOSIS — R531 Weakness: Secondary | ICD-10-CM | POA: Insufficient documentation

## 2022-08-05 LAB — CBC
HCT: 49.3 % — ABNORMAL HIGH (ref 36.0–46.0)
Hemoglobin: 17 g/dL — ABNORMAL HIGH (ref 12.0–15.0)
MCH: 28.8 pg (ref 26.0–34.0)
MCHC: 34.5 g/dL (ref 30.0–36.0)
MCV: 83.4 fL (ref 80.0–100.0)
Platelets: 375 10*3/uL (ref 150–400)
RBC: 5.91 MIL/uL — ABNORMAL HIGH (ref 3.87–5.11)
RDW: 13.4 % (ref 11.5–15.5)
WBC: 7.8 10*3/uL (ref 4.0–10.5)
nRBC: 0 % (ref 0.0–0.2)

## 2022-08-05 LAB — URINALYSIS, ROUTINE W REFLEX MICROSCOPIC
Bilirubin Urine: NEGATIVE
Glucose, UA: NEGATIVE mg/dL
Hgb urine dipstick: NEGATIVE
Ketones, ur: 5 mg/dL — AB
Nitrite: NEGATIVE
Protein, ur: 30 mg/dL — AB
Specific Gravity, Urine: 1.021 (ref 1.005–1.030)
pH: 5 (ref 5.0–8.0)

## 2022-08-05 LAB — COMPREHENSIVE METABOLIC PANEL
ALT: 47 U/L — ABNORMAL HIGH (ref 0–44)
AST: 35 U/L (ref 15–41)
Albumin: 4.3 g/dL (ref 3.5–5.0)
Alkaline Phosphatase: 37 U/L — ABNORMAL LOW (ref 38–126)
Anion gap: 12 (ref 5–15)
BUN: 13 mg/dL (ref 8–23)
CO2: 26 mmol/L (ref 22–32)
Calcium: 9.2 mg/dL (ref 8.9–10.3)
Chloride: 102 mmol/L (ref 98–111)
Creatinine, Ser: 0.7 mg/dL (ref 0.44–1.00)
GFR, Estimated: >60 mL/min (ref >60)
Glucose, Bld: 162 mg/dL — ABNORMAL HIGH (ref 70–99)
Potassium: 2.9 mmol/L — ABNORMAL LOW (ref 3.5–5.1)
Sodium: 140 mmol/L (ref 135–145)
Total Bilirubin: 1 mg/dL (ref 0.3–1.2)
Total Protein: 7.3 g/dL (ref 6.5–8.1)

## 2022-08-05 LAB — MAGNESIUM: Magnesium: 2 mg/dL (ref 1.7–2.4)

## 2022-08-05 LAB — LIPASE, BLOOD: Lipase: 38 U/L (ref 11–51)

## 2022-08-05 MED ORDER — ONDANSETRON HCL 4 MG PO TABS: 4.0000 mg | ORAL_TABLET | Freq: Four times a day (QID) | ORAL | 0 refills | Status: AC | PRN

## 2022-08-05 MED ORDER — POTASSIUM CHLORIDE CRYS ER 20 MEQ PO TBCR: 20.0000 meq | EXTENDED_RELEASE_TABLET | Freq: Two times a day (BID) | ORAL | 0 refills | Status: DC

## 2022-08-05 MED ORDER — POTASSIUM CHLORIDE 10 MEQ/100ML IV SOLN
10.0000 meq | INTRAVENOUS | Status: AC
  Administered 2022-08-05 (×2): 10 meq via INTRAVENOUS
  Filled 2022-08-05: qty 100

## 2022-08-05 MED ORDER — SODIUM CHLORIDE 0.9 % IV BOLUS
1000.0000 mL | Freq: Once | INTRAVENOUS | Status: AC
  Administered 2022-08-05: 1000 mL via INTRAVENOUS

## 2022-08-05 MED ORDER — POTASSIUM CHLORIDE CRYS ER 20 MEQ PO TBCR
40.0000 meq | EXTENDED_RELEASE_TABLET | Freq: Once | ORAL | Status: AC
  Administered 2022-08-05: 40 meq via ORAL
  Filled 2022-08-05: qty 2

## 2022-08-05 MED ORDER — ONDANSETRON HCL 4 MG/2ML IJ SOLN
4.0000 mg | Freq: Once | INTRAMUSCULAR | Status: AC
  Administered 2022-08-05: 4 mg via INTRAVENOUS
  Filled 2022-08-05: qty 2

## 2022-08-05 NOTE — Discharge Instructions (Signed)
You were seen in the emergency department today for evaluation of your frequent stools.  Your blood work showed that your potassium level was low and that you are likely dehydrated.  I am glad you are feeling better now.  Please use the below prescriptions as needed.  Follow-up your primary care doctor within a few days.  Return to the ER for any new or worsening symptoms.

## 2022-08-05 NOTE — ED Triage Notes (Signed)
Pt comes with c/o diarrhea for few days. Pt states she was seen here back on July 4th for same and had food poisoning. Pt denies any belly pain.

## 2022-08-05 NOTE — ED Provider Notes (Signed)
[facility] Provider Note    Event Date/Time   First MD Initiated Contact with Patient 08/05/22 1012     (approximate)   History   Diarrhea   HPI  Marcia [name] is a 72 y.o. female with history of hypertension, recently diagnosed with Salmonella and Giardia enteritis presenting to the emergency department for evaluation of frequent stools.  Patient was seen on July 4 for multiple episodes of diarrhea.  Is positive for Salmonella and Giardia.  She was discharged on Levaquin and Flagyl.  She reports that she had improvement in her diarrhea following this, but has continued to have some decreased p.o. intake due to limited appetite.  Yesterday, she had 5 formed bowel movements with some increased weakness.  In the setting of this, she presents to the ER.  Denies headache or abdominal pain.  No blood in stool.  No vomiting.    Physical Exam   Triage Vital Signs: ED Triage Vitals [08/05/22 0846]  Encounter Vitals Group     BP (!) 134/58     Systolic BP Percentile      Diastolic BP Percentile      Pulse Rate (!) 111     Resp 18     Temp 98.6 F (37 C)     Temp src      SpO2 97 %     Weight      Height      Head Circumference      Peak Flow      Pain Score 0     Pain Loc      Pain Education      Exclude from Growth Chart     Most recent vital signs: Vitals:   08/05/22 1130 08/05/22 1300  BP: (!) 143/59 139/70  Pulse: 86 93  Resp:  20  Temp:  98.1 F (36.7 C)  SpO2: 97% 96%     General: Awake, interactive  CV:  Regular rate, good peripheral perfusion.  Resp:  Lungs clear, unlabored respirations.  Abd:  Soft, nondistended, no tenderness to light or deep palpation throughout the abdomen  Neuro:  Symmetric facial movement, fluid speech   ED Results / Procedures / Treatments   Labs (all labs ordered are listed, but only abnormal results are displayed) Labs Reviewed  COMPREHENSIVE METABOLIC PANEL - Abnormal; Notable for the following  components:      Result Value   Potassium 2.9 (*)    Glucose, Bld 162 (*)    ALT 47 (*)    Alkaline Phosphatase 37 (*)    All other components within normal limits  CBC - Abnormal; Notable for the following components:   RBC 5.91 (*)    Hemoglobin 17.0 (*)    HCT 49.3 (*)    All other components within normal limits  URINALYSIS, ROUTINE W REFLEX MICROSCOPIC - Abnormal; Notable for the following components:   Color, Urine YELLOW (*)    APPearance HAZY (*)    Ketones, ur 5 (*)    Protein, ur 30 (*)    Leukocytes,Ua SMALL (*)    Bacteria, UA FEW (*)    Non Squamous Epithelial PRESENT (*)    All other components within normal limits  LIPASE, BLOOD  MAGNESIUM     EKG EKG independently reviewed interpreted by myself (ER attending) demonstrates:    RADIOLOGY Imaging independently reviewed and interpreted by myself demonstrates:    PROCEDURES:  Critical Care performed: No  Procedures   MEDICATIONS ORDERED  IN ED: Medications  potassium chloride 10 mEq in 100 mL IVPB (10 mEq Intravenous New Bag/Given 08/05/22 1159)  sodium chloride 0.9 % bolus 1,000 mL (1,000 mLs Intravenous New Bag/Given 08/05/22 1053)  ondansetron (ZOFRAN) injection 4 mg (4 mg Intravenous Given 08/05/22 1053)  potassium chloride SA (KLOR-CON M) CR tablet 40 mEq (40 mEq Oral Given 08/05/22 1150)     IMPRESSION / MDM / ASSESSMENT AND PLAN / ED COURSE  I reviewed the triage vital signs and the nursing notes.  Differential diagnosis includes, but is not limited to, electrolyte abnormality, medication side effect, anemia, dehydration, lower suspicion for acute intra-abdominal process given reassuring abdominal exam, recent negative CT at onset of symptoms  Patient's presentation is most consistent with acute presentation with potential threat to life or bodily function.  72-year female presenting to the emergency department for evaluation for increased bowel frequency with associated weakness, diarrhea now  resolved.  Tachycardic on presentation here, improved prior to my evaluation.  Lab work with elevated hemoglobin, ketones in urine with hyaline cast, suspect component of dehydration.  Normal white blood cell count.  CMP notable for hypokalemia with a K of 2.9.  Patient is tolerating p.o. intake.  Will provide IV fluids, Zofran, potassium repletion and reevaluate.  If patient is feeling improved, do think she will be stable for discharge home.  On reevaluation, patient does feel improved.  Her appetite is better.  She is tolerating p.o. intake.  She is comfortable with discharge home.  Will DC with a few days of potassium repletion, does report she has a history of low potassium.  Will also DC with a prescription for Zofran.  Strict return precautions provided.  Patient discharged in stable condition.     FINAL CLINICAL IMPRESSION(S) / ED DIAGNOSES   Final diagnoses:  Frequent stools  Hypokalemia     Rx / DC Orders   ED Discharge Orders          Ordered    potassium chloride SA (KLOR-CON M) 20 MEQ tablet  2 times daily        08/05/22 1338    ondansetron (ZOFRAN) 4 MG tablet  Every 6 hours PRN        08/05/22 1338             Note:  This document was prepared using Dragon voice recognition software and may include unintentional dictation errors.   [name], MD 08/05/22 [phone]

## 2022-08-05 NOTE — Telephone Encounter (Signed)
-----   Message from [facility] R sent at 07/29/2022  9:32 AM EDT ----- Patient needs TOC call completed please.

## 2022-08-05 NOTE — Chronic Care Management (AMB) (Signed)
   08/05/2022  Marcia [name] August 20, 1950 [id number]   Patient not currently participating in chronic care management services (CCM), status changed to previously enrolled.  [facility], BSN RN Case Manager [phone]

## 2022-08-05 NOTE — Transitions of Care (Post Inpatient/ED Visit) (Signed)
08/05/2022  Name: Marcia [name] MRN: [id number] DOB: [birth year]  Today's TOC FU Call Status: Today's TOC FU Call Status:: Successful TOC FU Call Competed TOC FU Call Complete Date: 07/25/22  Transition Care Management Follow-up Telephone Call Date of Discharge: 07/25/22 Discharge Facility: [facility]) Type of Discharge: Emergency Department Reason for ED Visit: Other: How have you been since you were released from the hospital?: Same Any questions or concerns?: No  Items Reviewed: Did you receive and understand the discharge instructions provided?: Yes Medications obtained,verified, and reconciled?: Yes (Medications Reviewed) Any new allergies since your discharge?: No Dietary orders reviewed?: No Do you have support at home?: Yes  Medications Reviewed Today: Medications Reviewed Today     Reviewed by Malen Gauze, CMA (Certified Medical Assistant) on 08/05/22 at 1319  Med List Status: <None>   Medication Order Taking? Sig Documenting Provider Last Dose Status Informant  Blood Glucose Monitoring Suppl (ONE TOUCH ULTRA 2) w/Device KIT [id number] No AS DIRECTED. Larae Grooms, NP Taking Active   buPROPion (WELLBUTRIN XL) 150 MG 24 hr tablet [id number]  Take 1 tablet (150 mg total) by mouth daily. Larae Grooms, NP  Active   citalopram (CELEXA) 20 MG tablet [id number]  Take 1 tablet (20 mg total) by mouth daily. Larae Grooms, NP  Active   cloNIDine (CATAPRES) 0.1 MG tablet [id number]  Take 1 tablet (0.1 mg total) by mouth daily. Larae Grooms, NP  Active   colchicine 0.6 MG tablet [id number] No Take 1 tablet (0.6 mg total) by mouth daily. Mecum, Oswaldo Conroy, PA-C Taking Active   fluticasone (FLONASE) 50 MCG/ACT nasal spray [id number] No Place 2 sprays into both nostrils daily. Larae Grooms, NP Taking Active   gabapentin (NEURONTIN) 100 MG capsule [id number]  Take 1 capsule (100 mg total) by mouth daily. Larae Grooms, NP  Active    gabapentin (NEURONTIN) 300 MG capsule [id number]  Take 1 capsule (300 mg total) by mouth 2 (two) times daily. Larae Grooms, NP  Active   glucose blood (ONETOUCH ULTRA) test strip [id number] No CHECK BLOOD SUGAR ONCE DAILY. Larae Grooms, NP Taking Active   hydrochlorothiazide (HYDRODIURIL) 25 MG tablet [id number]  Take 1 tablet (25 mg total) by mouth daily. Larae Grooms, NP  Active   hydrOXYzine (VISTARIL) 25 MG capsule [id number] No TAKE 1 CAPSULE BY MOUTH THREE TIMES DAILY AS NEEDED FOR ANXIETY Larae Grooms, NP Taking Active   hyoscyamine (LEVSIN) 0.125 MG tablet [id number]  Take 0.125 mg by mouth every 6 (six) hours as needed. [provider]  Active   Lancets [facility] DELICA PLUS [place]) MISC [id number] No CHECK BLOOD SUGAR ONCE DAILY. Larae Grooms, NP Taking Active   meloxicam (MOBIC) 15 MG tablet [id number]  Take 15 mg by mouth daily. [provider]  Active   metFORMIN (GLUCOPHAGE) 500 MG tablet [id number]  Take 2 tablets (1,000 mg total) by mouth 2 (two) times daily with a meal. Larae Grooms, NP  Active   omeprazole (PRILOSEC) 40 MG capsule [id number]  Take 40 mg by mouth daily. [provider]  Active   ondansetron (ZOFRAN) 4 MG tablet [id number] No Take 1 tablet (4 mg total) by mouth every 8 (eight) hours as needed for nausea or vomiting. Caro Laroche, DO Taking Active   ondansetron (ZOFRAN-ODT) 4 MG disintegrating tablet [id number]  Take 1 tablet (4 mg total) by mouth every 8 (eight) hours as needed. Faythe Ghee, PA-C  Active   pravastatin (PRAVACHOL) 40 MG tablet [id number]  Take 1  tablet (40 mg total) by mouth daily. Larae Grooms, NP  Active   Suvorexant [facility]) 10 MG TABS [id number]  Take at bedtime as needed Larae Grooms, NP  Active   tiZANidine (ZANAFLEX) 4 MG tablet [id number] No Take 1 tablet (4 mg total) by mouth at bedtime as needed for muscle spasms. Larae Grooms, NP Taking Active   triamcinolone cream  (KENALOG) 0.1 % [id number] No Apply 1 Application topically 2 (two) times daily. Larae Grooms, NP Taking Active             Home Care and Equipment/Supplies: Were Home Health Services Ordered?: No Any new equipment or medical supplies ordered?: No  Functional Questionnaire: Do you need assistance with bathing/showering or dressing?: No Do you need assistance with meal preparation?: No Do you need assistance with eating?: No Do you have difficulty maintaining continence: No Do you need assistance with getting out of bed/getting out of a chair/moving?: No Do you have difficulty managing or taking your medications?: No  Follow up appointments reviewed: PCP Follow-up appointment confirmed?: No MD Provider Line Number:[phone] Given: Yes Specialist Hospital Follow-up appointment confirmed?: NA Do you need transportation to your follow-up appointment?: No Do you understand care options if your condition(s) worsen?: Yes-patient verbalized understanding   Spoke with patient during patient's Transition of Care call and she says she was back in the ER currently, as she has not gotten better from 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 Patient says she would reach back out to our office to get ER follow up visit scheduled.    SIGNATURE Malen Gauze, CMA

## 2022-08-06 ENCOUNTER — Other Ambulatory Visit: Payer: Self-pay | Admitting: Nurse Practitioner

## 2022-08-06 NOTE — Telephone Encounter (Signed)
Medication Refill - Medication: hydrOXYzine (VISTARIL) 25 MG capsule [Pharmacy Med Name: HYDROXYZINE PAM 25 MG CAP]   Has the patient contacted their pharmacy? Yes.   (   Preferred Pharmacy (with phone number or street name):  [facility] - [place], [place] - [address], [place] [address] Phone: [phone]  Fax: [phone]   Has the patient been seen for an appointment in the last year OR does the patient have an upcoming appointment?   Yes   Agent: Please be advised that RX refills may take up to 3 business days. We ask that you follow-up with your pharmacy.

## 2022-08-06 NOTE — Telephone Encounter (Signed)
Requested Prescriptions  Pending Prescriptions Disp Refills   hydrOXYzine (VISTARIL) 25 MG capsule [Pharmacy Med Name: HYDROXYZINE PAM 25 MG CAP] 270 capsule 1    Sig: TAKE 1 CAPSULE BY MOUTH THREE TIMES DAILY AS NEEDED FOR ANXIETY     Ear, Nose, and Throat:  Antihistamines 2 Passed - 08/05/2022  5:40 PM      Passed - Cr in normal range and within 360 days    Creatinine, Ser  Date Value Ref Range Status  08/05/2022 0.70 0.44 - 1.00 mg/dL Final         Passed - Valid encounter within last 12 months    Recent Outpatient Visits           4 weeks ago 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[facility])   [place] [facility] Larae Grooms, NP   2 months ago Anxiety   [place] [facility] Larae Grooms, NP   4 months ago Aortic atherosclerosis [facility])   [place] [facility] Larae Grooms, NP   6 months ago Nausea and vomiting, unspecified vomiting type   [facility] Caro Laroche, DO   9 months ago Encounter for [employer] annual wellness exam   [place] [facility] Larae Grooms, NP

## 2022-08-07 ENCOUNTER — Telehealth: Payer: Self-pay

## 2022-08-07 NOTE — Telephone Encounter (Signed)
-----   Message from [facility] R sent at 08/07/2022  8:01 AM EDT ----- Patient needs TOC call completed please.

## 2022-08-07 NOTE — Transitions of Care (Post Inpatient/ED Visit) (Signed)
08/07/2022  Name: Marcia [name] MRN: [id number] DOB: [birth year]  Today's TOC FU Call Status: Today's TOC FU Call Status:: Successful TOC FU Call Competed TOC FU Call Complete Date: 08/07/22  Transition Care Management Follow-up Telephone Call Date of Discharge: 08/05/22 Discharge Facility: [facility]) Type of Discharge: Emergency Department Reason for ED Visit: Other: How have you been since you were released from the hospital?: Better Any questions or concerns?: No  Items Reviewed: Did you receive and understand the discharge instructions provided?: Yes Medications obtained,verified, and reconciled?: Yes (Medications Reviewed) Any new allergies since your discharge?: No Dietary orders reviewed?: NA Do you have support at home?: No  Medications Reviewed Today: Medications Reviewed Today     Reviewed by Malen Gauze, CMA (Certified Medical Assistant) on 08/05/22 at 1319  Med List Status: <None>   Medication Order Taking? Sig Documenting Provider Last Dose Status Informant  Blood Glucose Monitoring Suppl (ONE TOUCH ULTRA 2) w/Device KIT [id number] No AS DIRECTED. Larae Grooms, NP Taking Active   buPROPion (WELLBUTRIN XL) 150 MG 24 hr tablet [id number]  Take 1 tablet (150 mg total) by mouth daily. Larae Grooms, NP  Active   citalopram (CELEXA) 20 MG tablet [id number]  Take 1 tablet (20 mg total) by mouth daily. Larae Grooms, NP  Active   cloNIDine (CATAPRES) 0.1 MG tablet [id number]  Take 1 tablet (0.1 mg total) by mouth daily. Larae Grooms, NP  Active   colchicine 0.6 MG tablet [id number] No Take 1 tablet (0.6 mg total) by mouth daily. Mecum, Oswaldo Conroy, PA-C Taking Active   fluticasone (FLONASE) 50 MCG/ACT nasal spray [id number] No Place 2 sprays into both nostrils daily. Larae Grooms, NP Taking Active   gabapentin (NEURONTIN) 100 MG capsule [id number]  Take 1 capsule (100 mg total) by mouth daily. Larae Grooms, NP  Active    gabapentin (NEURONTIN) 300 MG capsule [id number]  Take 1 capsule (300 mg total) by mouth 2 (two) times daily. Larae Grooms, NP  Active   glucose blood (ONETOUCH ULTRA) test strip [id number] No CHECK BLOOD SUGAR ONCE DAILY. Larae Grooms, NP Taking Active   hydrochlorothiazide (HYDRODIURIL) 25 MG tablet [id number]  Take 1 tablet (25 mg total) by mouth daily. Larae Grooms, NP  Active   hydrOXYzine (VISTARIL) 25 MG capsule [id number] No TAKE 1 CAPSULE BY MOUTH THREE TIMES DAILY AS NEEDED FOR ANXIETY Larae Grooms, NP Taking Active   hyoscyamine (LEVSIN) 0.125 MG tablet [id number]  Take 0.125 mg by mouth every 6 (six) hours as needed. [provider]  Active   Lancets [facility] DELICA PLUS [place]) MISC [id number] No CHECK BLOOD SUGAR ONCE DAILY. Larae Grooms, NP Taking Active   meloxicam (MOBIC) 15 MG tablet [id number]  Take 15 mg by mouth daily. [provider]  Active   metFORMIN (GLUCOPHAGE) 500 MG tablet [id number]  Take 2 tablets (1,000 mg total) by mouth 2 (two) times daily with a meal. Larae Grooms, NP  Active   omeprazole (PRILOSEC) 40 MG capsule [id number]  Take 40 mg by mouth daily. [provider]  Active   ondansetron (ZOFRAN) 4 MG tablet [id number] No Take 1 tablet (4 mg total) by mouth every 8 (eight) hours as needed for nausea or vomiting. Caro Laroche, DO Taking Active   ondansetron (ZOFRAN-ODT) 4 MG disintegrating tablet [id number]  Take 1 tablet (4 mg total) by mouth every 8 (eight) hours as needed. Faythe Ghee, PA-C  Active   pravastatin (PRAVACHOL) 40 MG tablet [id number]  Take 1  tablet (40 mg total) by mouth daily. Larae Grooms, NP  Active   Suvorexant [facility]) 10 MG TABS [id number]  Take at bedtime as needed Larae Grooms, NP  Active   tiZANidine (ZANAFLEX) 4 MG tablet [id number] No Take 1 tablet (4 mg total) by mouth at bedtime as needed for muscle spasms. Larae Grooms, NP Taking Active   triamcinolone cream  (KENALOG) 0.1 % [id number] No Apply 1 Application topically 2 (two) times daily. Larae Grooms, NP Taking Active             Home Care and Equipment/Supplies: Were Home Health Services Ordered?: NA Any new equipment or medical supplies ordered?: NA  Functional Questionnaire: Do you need assistance with bathing/showering or dressing?: No Do you need assistance with meal preparation?: No Do you need assistance with eating?: No Do you have difficulty maintaining continence: No Do you need assistance with getting out of bed/getting out of a chair/moving?: No Do you have difficulty managing or taking your medications?: No  Follow up appointments reviewed: PCP Follow-up appointment confirmed?: Yes MD Provider Line Number:[phone] Given: Yes Date of PCP follow-up appointment?: 08/15/22 Follow-up Provider: Jacquelin Hawking, PA Specialist Hospital Follow-up appointment confirmed?: NA Do you need transportation to your follow-up appointment?: No Do you understand care options if your condition(s) worsen?: Yes-patient verbalized understanding    SIGNATURE Malen Gauze, CMA

## 2022-08-15 ENCOUNTER — Ambulatory Visit: Payer: 59 | Admitting: Physician Assistant

## 2022-08-19 ENCOUNTER — Ambulatory Visit (INDEPENDENT_AMBULATORY_CARE_PROVIDER_SITE_OTHER): Payer: 59 | Admitting: Physician Assistant

## 2022-08-19 VITALS — BP 130/78 | HR 93 | Ht 68.0 in | Wt 155.4 lb

## 2022-08-19 DIAGNOSIS — N760 Acute vaginitis: Secondary | ICD-10-CM

## 2022-08-19 DIAGNOSIS — R300 Dysuria: Secondary | ICD-10-CM | POA: Diagnosis not present

## 2022-08-19 DIAGNOSIS — G4700 Insomnia, unspecified: Secondary | ICD-10-CM

## 2022-08-19 DIAGNOSIS — R29898 Other symptoms and signs involving the musculoskeletal system: Secondary | ICD-10-CM

## 2022-08-19 DIAGNOSIS — G252 Other specified forms of tremor: Secondary | ICD-10-CM

## 2022-08-19 DIAGNOSIS — B9689 Other specified bacterial agents as the cause of diseases classified elsewhere: Secondary | ICD-10-CM

## 2022-08-19 MED ORDER — BELSOMRA 10 MG PO TABS
20.0000 mg | ORAL_TABLET | Freq: Every day | ORAL | 2 refills | Status: DC
Start: 2022-08-19 — End: 2022-08-23

## 2022-08-19 NOTE — Progress Notes (Unsigned)
Acute Office Visit   Patient: Marcia [name]   DOB: February 01, 1950   72 y.o. Female  MRN: [id number] Visit Date: 08/19/2022  Today's healthcare provider: Oswaldo Conroy Rett Stehlik, PA-C  Introduced myself to the patient as a Secondary school teacher and provided education on APPs in clinical practice.    Chief Complaint  Patient presents with   Hospitalization Follow-up   Diarrhea   Tremors    Patient says she has noticed Tremors/Shakes on Friday and she says she just noticed it. Patient says it is not always, it comes and goes.    Leg Problem    Patient says when she walks she noticed her right leg feels as if it is about to give out on her. Patient says she also first noticed the leg issue, when she first noticed the tremors and shakes.    Dizziness    Patient says when she stands up she will notice some dizziness and says she has to stand there for a little while. Patient says when she sits up from laying down, she will noticed the dizzy spells.    Subjective    HPI HPI     Tremors    Additional comments: Patient says she has noticed Tremors/Shakes on Friday and she says she just noticed it. Patient says it is not always, it comes and goes.         Leg Problem    Additional comments: Patient says when she walks she noticed her right leg feels as if it is about to give out on her. Patient says she also first noticed the leg issue, when she first noticed the tremors and shakes.         Dizziness    Additional comments: Patient says when she stands up she will notice some dizziness and says she has to stand there for a little while. Patient says when she sits up from laying down, she will noticed the dizzy spells.       Last edited by Providence Crosby, PA-C on 08/19/2022  3:53 PM.      Patient is here with her niece   Emergency room follow up Patient was treated for Salmonella enteritis and giardia on 07/25/22 and was seen again in ED on 08/05/22 for frequent stools  She reports she is feeling much  better at this time  She denies fevers, chills, weakness, GI upset, diarrhea  She reports she is eating regularly and denies recent falls or injuries   Tremors She reports tremors starting on Friday  She and her niece state that they seemed to be getting worse over the weekend but have improved today  She reports tremors do not get worse if she is doing something   She also reports her right leg sometimes feels like it will give out when she is climbing stairs She feels like it will not support all of her weight She states she first noticed the tremors and weakness on Friday   She reports she has not been sleeping completely through the night despite taking Belsomra She states she wakes up several times throughout the night and feels wide awake   Medications: Outpatient Medications Prior to Visit  Medication Sig   Blood Glucose Monitoring Suppl (ONE TOUCH ULTRA 2) w/Device KIT AS DIRECTED.   buPROPion (WELLBUTRIN XL) 150 MG 24 hr tablet Take 1 tablet (150 mg total) by mouth daily.   citalopram (CELEXA) 20 MG tablet Take 1 tablet (  20 mg total) by mouth daily.   cloNIDine (CATAPRES) 0.1 MG tablet Take 1 tablet (0.1 mg total) by mouth daily.   colchicine 0.6 MG tablet Take 1 tablet (0.6 mg total) by mouth daily.   fluticasone (FLONASE) 50 MCG/ACT nasal spray Place 2 sprays into both nostrils daily.   gabapentin (NEURONTIN) 100 MG capsule Take 1 capsule (100 mg total) by mouth daily.   gabapentin (NEURONTIN) 300 MG capsule Take 1 capsule (300 mg total) by mouth 2 (two) times daily.   glucose blood (ONETOUCH ULTRA) test strip CHECK BLOOD SUGAR ONCE DAILY.   hydrochlorothiazide (HYDRODIURIL) 25 MG tablet Take 1 tablet (25 mg total) by mouth daily.   hydrOXYzine (VISTARIL) 25 MG capsule TAKE 1 CAPSULE BY MOUTH THREE TIMES DAILY AS NEEDED FOR ANXIETY   hyoscyamine (LEVSIN) 0.125 MG tablet Take 0.125 mg by mouth every 6 (six) hours as needed.   Lancets (ONETOUCH DELICA PLUS LANCET33G) MISC  CHECK BLOOD SUGAR ONCE DAILY.   meloxicam (MOBIC) 15 MG tablet Take 15 mg by mouth daily.   metFORMIN (GLUCOPHAGE) 500 MG tablet Take 2 tablets (1,000 mg total) by mouth 2 (two) times daily with a meal.   omeprazole (PRILOSEC) 40 MG capsule Take 40 mg by mouth daily.   ondansetron (ZOFRAN) 4 MG tablet Take 1 tablet (4 mg total) by mouth every 8 (eight) hours as needed for nausea or vomiting.   ondansetron (ZOFRAN-ODT) 4 MG disintegrating tablet Take 1 tablet (4 mg total) by mouth every 8 (eight) hours as needed.   pravastatin (PRAVACHOL) 40 MG tablet Take 1 tablet (40 mg total) by mouth daily.   tiZANidine (ZANAFLEX) 4 MG tablet Take 1 tablet (4 mg total) by mouth at bedtime as needed for muscle spasms.   triamcinolone cream (KENALOG) 0.1 % Apply 1 Application topically 2 (two) times daily.   [DISCONTINUED] Suvorexant (BELSOMRA) 10 MG TABS Take at bedtime as needed   potassium chloride SA (KLOR-CON M) 20 MEQ tablet Take 1 tablet (20 mEq total) by mouth 2 (two) times daily for 3 days.   No facility-administered medications prior to visit.    Review of Systems  Constitutional:  Negative for chills, fatigue and fever.  HENT:  Positive for hearing loss.   Gastrointestinal:  Negative for blood in stool, constipation, diarrhea, nausea and vomiting.  Genitourinary:  Positive for dysuria.  Neurological:  Positive for tremors and weakness. Negative for dizziness, facial asymmetry, speech difficulty, light-headedness, numbness and headaches.         Objective    BP 130/78   Pulse 93   Ht 5\' 8"  (1.727 m)   Wt 155 lb 6.4 oz (70.5 kg)   SpO2 93%   BMI 23.63 kg/m      Physical Exam Vitals reviewed.  Constitutional:      General: She is awake.     Appearance: Normal appearance. She is well-developed and well-groomed.  HENT:     Head: Normocephalic and atraumatic.  Cardiovascular:     Rate and Rhythm: Normal rate and regular rhythm.     Pulses: Normal pulses.          Radial pulses  are 2+ on the right side and 2+ on the left side.       Dorsalis pedis pulses are 2+ on the right side and 2+ on the left side.     Heart sounds: Normal heart sounds. No murmur heard.    No friction rub. No gallop.  Pulmonary:     Effort:  Pulmonary effort is normal.     Breath sounds: Normal breath sounds. No decreased air movement. No decreased breath sounds, wheezing, rhonchi or rales.  Musculoskeletal:     Cervical back: Normal range of motion and neck supple.     Right lower leg: No edema.     Left lower leg: No edema.  Neurological:     General: No focal deficit present.     Mental Status: She is alert and oriented to person, place, and time.     GCS: GCS eye subscore is 4. GCS verbal subscore is 5. GCS motor subscore is 6.     Cranial Nerves: Cranial nerves 2-12 are intact. No dysarthria or facial asymmetry.     Motor: Tremor present. No weakness or atrophy.     Gait: Gait is intact.     Deep Tendon Reflexes:     Reflex Scores:      Patellar reflexes are 1+ on the right side and 1+ on the left side.    Comments: Resting tremor with worsening intensity with intention actions- increased intensity while testing grip strength and dorsiflexion/plantar flexion strength   Comments: MENTAL STATUS: AAOx3, memory intact, fund of knowledge appropriate   LANG/SPEECH: Naming and repetition intact, fluent, no dysarthria, follows 3-step commands, answers questions appropriately     CRANIAL NERVES:   II: Pupils equal and reactive, no RAPD   III, IV, VI: EOM intact, no gaze preference or deviation, no nystagmus.   V: normal sensation in V1, V2, and V3 segments bilaterally   VII: no asymmetry, no nasolabial fold flattening   VIII: normal hearing to speech   IX, X: normal palatal elevation, no uvular deviation   XI: 5/5 head turn and 5/5 shoulder shrug bilaterally   XII: midline tongue protrusion   MOTOR:  5/5 bilateral grip strength 5/5 strength dorsiflexion/plantarflexion b/l  5/5  bilateral hip flexion   SENSORY:  Normal to light touch Romberg absent   COORD: tremor, no dysmetria   STATION: normal stance   GAIT: Normal   Psychiatric:        Behavior: Behavior is cooperative.       Results for orders placed or performed in visit on 08/19/22  WET PREP FOR TRICH, YEAST, CLUE   Specimen: Urine   Urine  Result Value Ref Range   Trichomonas Exam Negative Negative   Yeast Exam Negative Negative   Clue Cell Exam Positive (A) Negative  Microscopic Examination   Urine  Result Value Ref Range   WBC, UA 0-5 0 - 5 /hpf   RBC, Urine 0-2 0 - 2 /hpf   Epithelial Cells (non renal) 0-10 0 - 10 /hpf   Mucus, UA Present (A) Not Estab.   Bacteria, UA None seen None seen/Few  Comp Met (CMET)  Result Value Ref Range   Glucose 81 70 - 99 mg/dL   BUN 16 8 - 27 mg/dL   Creatinine, Ser 1.61 0.57 - 1.00 mg/dL   eGFR 82 >09 UE/AVW/0.98   BUN/Creatinine Ratio 21 12 - 28   Sodium 141 134 - 144 mmol/L   Potassium 4.2 3.5 - 5.2 mmol/L   Chloride 101 96 - 106 mmol/L   CO2 25 20 - 29 mmol/L   Calcium 9.7 8.7 - 10.3 mg/dL   Total Protein 7.0 6.0 - 8.5 g/dL   Albumin 4.4 3.8 - 4.8 g/dL   Globulin, Total 2.6 1.5 - 4.5 g/dL   Bilirubin Total 0.4 0.0 - 1.2 mg/dL   Alkaline  Phosphatase 47 44 - 121 IU/L   AST 26 0 - 40 IU/L   ALT 32 0 - 32 IU/L  CBC w/Diff  Result Value Ref Range   WBC 7.0 3.4 - 10.8 x10E3/uL   RBC 5.02 3.77 - 5.28 x10E6/uL   Hemoglobin 14.4 11.1 - 15.9 g/dL   Hematocrit 52.8 41.3 - 46.6 %   MCV 88 79 - 97 fL   MCH 28.7 26.6 - 33.0 pg   MCHC 32.7 31.5 - 35.7 g/dL   RDW 24.4 01.0 - 27.2 %   Platelets 333 150 - 450 x10E3/uL   Neutrophils 49 Not Estab. %   Lymphs 35 Not Estab. %   Monocytes 9 Not Estab. %   Eos 6 Not Estab. %   Basos 1 Not Estab. %   Neutrophils Absolute 3.5 1.4 - 7.0 x10E3/uL   Lymphocytes Absolute 2.4 0.7 - 3.1 x10E3/uL   Monocytes Absolute 0.6 0.1 - 0.9 x10E3/uL   EOS (ABSOLUTE) 0.5 (H) 0.0 - 0.4 x10E3/uL   Basophils Absolute  0.0 0.0 - 0.2 x10E3/uL   Immature Granulocytes 0 Not Estab. %   Immature Grans (Abs) 0.0 0.0 - 0.1 x10E3/uL  Vitamin B1  Result Value Ref Range   Thiamine WILL FOLLOW   B12  Result Value Ref Range   Vitamin B-12 >2000 (H) 232 - 1245 pg/mL  Urinalysis, Routine w reflex microscopic  Result Value Ref Range   Specific Gravity, UA 1.025 1.005 - 1.030   pH, UA 7.0 5.0 - 7.5   Color, UA Yellow Yellow   Appearance Ur Clear Clear   Leukocytes,UA 1+ (A) Negative   Protein,UA Negative Negative/Trace   Glucose, UA Negative Negative   Ketones, UA Trace (A) Negative   RBC, UA Negative Negative   Bilirubin, UA Negative Negative   Urobilinogen, Ur 0.2 0.2 - 1.0 mg/dL   Nitrite, UA Negative Negative   Microscopic Examination See below:     Assessment & Plan      Return in about 2 months (around 10/20/2022) for tremors, insomnia .      Problem List Items Addressed This Visit       Nervous and Auditory   Weakness of right leg    Acute, new concern She reports feeling like her right leg is going to give out on her over the past few days - seemed to start around the same time as tremors did Will check CMP, CBC, B12, thiamine, magnesium - results to dictate further management Will refer to Neurology given new onset of tremors with weakness Follow up in 2 months or sooner if concerns arise.          Other   Insomnia    Chronic, historic condition, reports issues with sleep maintenance at this time She reports she is waking up several times per night despite taking medications She is taking Belsomra 10 mg PO every night Will try increasing to Belsomra 20 mg PO at bedtime for now  Recommend sleep hygiene measures to further assist with management Follow up in 2 months or sooner if concerns arise       Relevant Medications   Suvorexant (BELSOMRA) 10 MG TABS   Coarse tremors - Primary    Acute, new concern Reports new onset of tremors for the past few days  Tremors appear to be  present at rest and intensify with intention  Will send to Neurology for evaluation  Discussed various etiologies - essential tremor, hypoglycemia, vitamin/electrolyte abnormality, Parkinson's  Will  check CBC, CMP, B12, thiamine, Magnesium for evaluation- results to dictate further management Recommend follow up in about 2 months or sooner if concerns arise.        Relevant Orders   Comp Met (CMET) (Completed)   CBC w/Diff (Completed)   Magnesium   Vitamin B1 (Completed)   B12 (Completed)   Ambulatory referral to Neurology   Other Visit Diagnoses     Dysuria     Acute, new concern UA was positive for trace leukocytes today - will send for urine culture. Results to dictate further management Wet prep was positive for BV so will treat while waiting for culture results as this may have contaminated UA  Follow up as needed for persistent or progressing symptoms     Relevant Orders   Urinalysis, Routine w reflex microscopic (Completed)   WET PREP FOR TRICH, YEAST, CLUE (Completed)   Urine Culture   Microscopic Examination (Completed)   BV (bacterial vaginosis)       Relevant Medications   metroNIDAZOLE (FLAGYL) 500 MG tablet        Return in about 2 months (around 10/20/2022) for tremors, insomnia .   I, Pope Brunty E Frederick Marro, PA-C, have reviewed all documentation for this visit. The documentation on 08/20/22 for the exam, diagnosis, procedures, and orders are all accurate and complete.   Jacquelin Hawking, MHS, PA-C [facility]

## 2022-08-20 ENCOUNTER — Telehealth: Payer: Self-pay

## 2022-08-20 ENCOUNTER — Ambulatory Visit: Payer: Self-pay | Admitting: *Deleted

## 2022-08-20 ENCOUNTER — Telehealth: Payer: Self-pay | Admitting: Nurse Practitioner

## 2022-08-20 DIAGNOSIS — G252 Other specified forms of tremor: Secondary | ICD-10-CM | POA: Insufficient documentation

## 2022-08-20 DIAGNOSIS — R29898 Other symptoms and signs involving the musculoskeletal system: Secondary | ICD-10-CM | POA: Insufficient documentation

## 2022-08-20 MED ORDER — METRONIDAZOLE 500 MG PO TABS: 500.0000 mg | ORAL_TABLET | Freq: Two times a day (BID) | ORAL | 0 refills | Status: DC

## 2022-08-20 NOTE — Assessment & Plan Note (Signed)
Acute, new concern She reports feeling like her right leg is going to give out on her over the past few days - seemed to start around the same time as tremors did Will check CMP, CBC, B12, thiamine, magnesium - results to dictate further management Will refer to Neurology given new onset of tremors with weakness Follow up in 2 months or sooner if concerns arise.

## 2022-08-20 NOTE — Telephone Encounter (Signed)
Copied from CRM [id number]. Topic: General - Other >> Aug 20, 2022  2:58 PM Ja-Kwan M wrote: Reason for CRM: Pt requests call back to go over her most recent lab results. Cb# [phone]

## 2022-08-20 NOTE — Progress Notes (Signed)
Your cervicovaginal swab was positive for bacterial vaginosis.  I have sent in a script for Flagyl to be taken by mouth twice per day for 7 days. Please finish the entire course unless you are instructed to stop or develop an allergic reaction. Please note that this medication can cause severe nausea and vomiting if alcohol is consumed while you are taking it so please refrain from this during the week you are on it. Please let us know if you have further questions or concerns.   Your urinalysis was positive for some white blood cells but this may have been contamination from the BV. I have sent a sample for culture and this will determine further management. Your CBC was normal- no evidence of anemia at this time. Your electrolytes, liver and kidney function were normal.  Your B12 was high. We are still waiting on your Magnesium and thiamine results. We will keep you updated on those results once they are available.

## 2022-08-20 NOTE — Assessment & Plan Note (Signed)
Chronic, historic condition, reports issues with sleep maintenance at this time She reports she is waking up several times per night despite taking medications She is taking Belsomra 10 mg PO every night Will try increasing to Belsomra 20 mg PO at bedtime for now  Recommend sleep hygiene measures to further assist with management Follow up in 2 months or sooner if concerns arise

## 2022-08-20 NOTE — Telephone Encounter (Signed)
hemorrhoid concerns / rx req   Per agent: "The patient would like to be prescribed something for their hemorrhoid concerns that they've experienced for roughly 3 days  The patient was seen recently and forgot to mention these concerns  Please contact the patient further when possible"       Chief Complaint: Hemorrhoids Symptoms: Reports one external hemorrhoid, smear of blood with wiping only. Painful and itching. Frequency: 3 days Pertinent Negatives: Patient denies  Disposition: [] ED /[] Urgent Care (no appt availability in office) / [] Appointment(In office/virtual)/ []  [facility]/ [x] Home Care/ [] Refused Recommended Disposition /[] [facility]/ []  Follow-up with PCP Additional Notes: Has not attempted OTC meds. Home care advise provided, advise to call back for worsening symptoms. Assured would alert practice per her request. Seen yesterday, states forgot to mention it. Reason for Disposition  Mild rectal pain  Answer Assessment - Initial Assessment Questions 1. SYMPTOM:  "What's the main symptom you're concerned about?" (e.g., pain, itching, swelling, rash)     Painful, itching 2. ONSET: "When did the   start?"     3 days ago 3. RECTAL PAIN: "Do you have any pain around your rectum?" "How bad is the pain?"  (Scale 0-10; or mild, moderate, severe)   - NONE (0): no pain   - MILD (1-3): doesn't interfere with normal activities    - MODERATE (4-7): interferes with normal activities or awakens from sleep, limping    - SEVERE (8-10): excruciating pain, unable to have a bowel movement      severe 4. RECTAL ITCHING: "Do you have any itching in this area?" "How bad is the itching?"  (Scale 0-10; or mild, moderate, severe)   - NONE: no itching   - MILD: doesn't interfere with normal activities    - MODERATE-SEVERE: interferes with normal activities or awakens from sleep     varies 5. CONSTIPATION: "Do you have constipation?" If Yes, ask: "How bad is it?"      No 6. CAUSE: "What do you think is causing the anus symptoms?"     Hemorrhoids. 7. OTHER SYMPTOMS: "Do you have any other symptoms?"  (e.g., abdomen pain, fever, rectal bleeding, vomiting)     Little smear when wiping  Protocols used: Rectal Symptoms-A-AH

## 2022-08-20 NOTE — Assessment & Plan Note (Signed)
Acute, new concern Reports new onset of tremors for the past few days  Tremors appear to be present at rest and intensify with intention  Will send to Neurology for evaluation  Discussed various etiologies - essential tremor, hypoglycemia, vitamin/electrolyte abnormality, Parkinson's  Will check CBC, CMP, B12, thiamine, Magnesium for evaluation- results to dictate further management Recommend follow up in about 2 months or sooner if concerns arise.

## 2022-08-20 NOTE — Telephone Encounter (Signed)
Pt called for lab results. Shared provider's note.  Oswaldo Conroy Mecum, PA-C 08/20/2022  3:47 PM EDT Back to Top    Your cervicovaginal swab was positive for bacterial vaginosis.  I have sent in a script for Flagyl to be taken by mouth twice per day for 7 days. Please finish the entire course unless you are instructed to stop or develop an allergic reaction. Please note that this medication can cause severe nausea and vomiting if alcohol is consumed while you are taking it so please refrain from this during the week you are on it. Please let us know if you have further questions or concerns.   Your urinalysis was positive for some white blood cells but this may have been contamination from the BV. I have sent a sample for culture and this will determine further management. Your CBC was normal- no evidence of anemia at this time. Your electrolytes, liver and kidney function were normal. Your B12 was high. We are still waiting on your Magnesium and thiamine results. We will keep you updated on those results once they are available.    Pt is still experiencing tremors, and she would like to know next steps in plan for diagnosis and treatment.

## 2022-08-20 NOTE — Telephone Encounter (Signed)
See other phone encounter.  

## 2022-08-21 NOTE — Telephone Encounter (Signed)
Patient was made aware of recent lab results and made aware that we are still waiting on two of the blood test results. Patient was made aware of Erin's recommendations regarding the hemorrhoids. Patient verbalized understanding and says she will pick up her prescription today that was sent to her local pharmacy to start.

## 2022-08-22 ENCOUNTER — Telehealth: Payer: Self-pay

## 2022-08-22 NOTE — Progress Notes (Signed)
Thiamine levels are above normal but this is fine. No changes recommended

## 2022-08-22 NOTE — Telephone Encounter (Signed)
Received a faxed from patient's pharmacy [facility] stating the patient insurance will only cover the patient to take 1 tablet instead of the 2 tablets. They are requesting a new prescription be sent over to patient's pharmacy of [address], [address]. Please advise?

## 2022-08-23 ENCOUNTER — Other Ambulatory Visit: Payer: Self-pay | Admitting: Physician Assistant

## 2022-08-23 DIAGNOSIS — G4700 Insomnia, unspecified: Secondary | ICD-10-CM

## 2022-08-23 MED ORDER — BELSOMRA 20 MG PO TABS: 20.0000 mg | ORAL_TABLET | Freq: Every evening | ORAL | 2 refills | Status: DC | PRN

## 2022-08-23 NOTE — Telephone Encounter (Signed)
Pt is calling to report that the insurance will not pay for 2 pills. However they will pay for 1 tablet of 20mg  of Belsomra. Please advise

## 2022-08-23 NOTE — Telephone Encounter (Signed)
I'll change to one 20 mg tablet when I have a chance to send in new script

## 2022-08-26 ENCOUNTER — Emergency Department
Admission: EM | Admit: 2022-08-26 | Discharge: 2022-08-26 | Disposition: A | Discharge status: Home / Self Care | Payer: 59 | Attending: Emergency Medicine | Admitting: Emergency Medicine

## 2022-08-26 ENCOUNTER — Encounter: Payer: Self-pay | Admitting: Emergency Medicine

## 2022-08-26 ENCOUNTER — Other Ambulatory Visit: Payer: Self-pay

## 2022-08-26 DIAGNOSIS — A0472 Enterocolitis due to Clostridium difficile, not specified as recurrent: Secondary | ICD-10-CM | POA: Diagnosis not present

## 2022-08-26 DIAGNOSIS — K625 Hemorrhage of anus and rectum: Secondary | ICD-10-CM | POA: Diagnosis present

## 2022-08-26 DIAGNOSIS — D72829 Elevated white blood cell count, unspecified: Secondary | ICD-10-CM | POA: Insufficient documentation

## 2022-08-26 DIAGNOSIS — I10 Essential (primary) hypertension: Secondary | ICD-10-CM | POA: Diagnosis not present

## 2022-08-26 LAB — C DIFFICILE QUICK SCREEN W PCR REFLEX
C Diff antigen: POSITIVE — AB
C Diff toxin: NEGATIVE

## 2022-08-26 LAB — GASTROINTESTINAL PANEL BY PCR, STOOL (REPLACES STOOL CULTURE)

## 2022-08-26 LAB — COMPREHENSIVE METABOLIC PANEL
ALT: 57 U/L — ABNORMAL HIGH (ref 0–44)
AST: 48 U/L — ABNORMAL HIGH (ref 15–41)
Albumin: 4.2 g/dL (ref 3.5–5.0)
Alkaline Phosphatase: 41 U/L (ref 38–126)
Anion gap: 11 (ref 5–15)
BUN: 14 mg/dL (ref 8–23)
CO2: 23 mmol/L (ref 22–32)
Calcium: 9.6 mg/dL (ref 8.9–10.3)
Chloride: 105 mmol/L (ref 98–111)
Creatinine, Ser: 0.7 mg/dL (ref 0.44–1.00)
GFR, Estimated: >60 mL/min (ref >60)
Glucose, Bld: 119 mg/dL — ABNORMAL HIGH (ref 70–99)
Potassium: 3.8 mmol/L (ref 3.5–5.1)
Sodium: 139 mmol/L (ref 135–145)
Total Bilirubin: 0.4 mg/dL (ref 0.3–1.2)
Total Protein: 7.1 g/dL (ref 6.5–8.1)

## 2022-08-26 LAB — CBC WITH DIFFERENTIAL/PLATELET
Abs Immature Granulocytes: 0.02 10*3/uL (ref 0.00–0.07)
Basophils Absolute: 0.1 10*3/uL (ref 0.0–0.1)
Basophils Relative: 1 %
Eosinophils Absolute: 0.1 10*3/uL (ref 0.0–0.5)
Eosinophils Relative: 2 %
HCT: 47.4 % — ABNORMAL HIGH (ref 36.0–46.0)
Hemoglobin: 15.9 g/dL — ABNORMAL HIGH (ref 12.0–15.0)
Immature Granulocytes: 0 %
Lymphocytes Relative: 26 %
Lymphs Abs: 2 10*3/uL (ref 0.7–4.0)
MCH: 28.6 pg (ref 26.0–34.0)
MCHC: 33.5 g/dL (ref 30.0–36.0)
MCV: 85.4 fL (ref 80.0–100.0)
Monocytes Absolute: 0.5 10*3/uL (ref 0.1–1.0)
Monocytes Relative: 6 %
Neutro Abs: 5.2 10*3/uL (ref 1.7–7.7)
Neutrophils Relative %: 65 %
Platelets: 338 10*3/uL (ref 150–400)
RBC: 5.55 MIL/uL — ABNORMAL HIGH (ref 3.87–5.11)
RDW: 13.8 % (ref 11.5–15.5)
WBC: 8 10*3/uL (ref 4.0–10.5)
nRBC: 0 % (ref 0.0–0.2)

## 2022-08-26 LAB — PROTIME-INR
INR: 1.1 (ref 0.8–1.2)
Prothrombin Time: 14.1 seconds (ref 11.4–15.2)

## 2022-08-26 LAB — MAGNESIUM: Magnesium: 1.9 mg/dL (ref 1.7–2.4)

## 2022-08-26 LAB — CLOSTRIDIUM DIFFICILE BY PCR, REFLEXED: Toxigenic C. Difficile by PCR: POSITIVE — AB

## 2022-08-26 MED ORDER — ONDANSETRON 4 MG PO TBDP: 4.0000 mg | ORAL_TABLET | Freq: Three times a day (TID) | ORAL | 0 refills | Status: AC | PRN

## 2022-08-26 MED ORDER — ONDANSETRON HCL 4 MG/2ML IJ SOLN
4.0000 mg | Freq: Once | INTRAMUSCULAR | Status: AC
  Administered 2022-08-26: 4 mg via INTRAVENOUS
  Filled 2022-08-26: qty 2

## 2022-08-26 MED ORDER — LACTATED RINGERS IV BOLUS
1000.0000 mL | Freq: Once | INTRAVENOUS | Status: AC
  Administered 2022-08-26: 1000 mL via INTRAVENOUS

## 2022-08-26 MED ORDER — VANCOMYCIN HCL 125 MG PO CAPS
125.0000 mg | ORAL_CAPSULE | Freq: Once | ORAL | Status: AC
  Administered 2022-08-26: 125 mg via ORAL
  Filled 2022-08-26: qty 1

## 2022-08-26 MED ORDER — VANCOMYCIN HCL 125 MG PO CAPS: 125.0000 mg | ORAL_CAPSULE | Freq: Four times a day (QID) | ORAL | 0 refills | Status: AC

## 2022-08-26 NOTE — Discharge Instructions (Addendum)
You were seen in the emergency department for diarrhea.  You tested positive for C. difficile.  You were given your first dose of oral antibiotics in the emergency department.  You are given a prescription for oral antibiotics with vancomycin that you need to take 4 times a day.  Follow-up closely with your primary care physician.  Stay hydrated and drink plenty of fluids.  Return to the emergency department for any worsening symptoms or signs of dehydration.  zofran (ondansetron) - nausea medication, take 1 tablet every 8 hours as needed for nausea/vomiting.

## 2022-08-26 NOTE — ED Provider Notes (Signed)
[facility] Provider Note    Event Date/Time   First MD Initiated Contact with Patient 08/26/22 [id number]     (approximate)   History   Rectal Bleeding   HPI  Marcia [name] is a 72 y.o. female past medical history significant for hypertension, presents to the emergency department for rectal bleeding.  Patient states that she woke up this morning and noted 3 episodes of dark stool.  States that this was similar to episodes whenever she had a diagnosis of Salmonella and Giardia that was 4 weeks ago.  States that her symptoms resolved after treatment with antibiotics.  Also recently started on antibiotic for bacterial vaginosis with primary care physician.  Endorses nausea but no episodes of vomiting.  Denies any fever or chills.  Denies any abdominal pain.  Not on anticoagulation.  No recent travel.  Recently on antibiotics with Levaquin and Flagyl.     Physical Exam   Triage Vital Signs: ED Triage Vitals  Encounter Vitals Group     BP 08/26/22 0815 (!) 143/74     Systolic BP Percentile --      Diastolic BP Percentile --      Pulse Rate 08/26/22 0814 100     Resp 08/26/22 0814 18     Temp 08/26/22 0814 98.3 F (36.8 C)     Temp Source 08/26/22 0814 Oral     SpO2 08/26/22 0814 98 %     Weight 08/26/22 0814 150 lb (68 kg)     Height 08/26/22 0814 5\' 8"  (1.727 m)     Head Circumference --      Peak Flow --      Pain Score 08/26/22 0814 8     Pain Loc --      Pain Education --      Exclude from Growth Chart --     Most recent vital signs: Vitals:   08/26/22 0900 08/26/22 0930  BP: (!) 124/59 (!) 141/66  Pulse: 88 93  Resp: 14 17  Temp:    SpO2: 97% 98%    Physical Exam Constitutional:      Appearance: She is well-developed.  HENT:     Head: Atraumatic.  Eyes:     Conjunctiva/sclera: Conjunctivae normal.  Cardiovascular:     Rate and Rhythm: Regular rhythm. Tachycardia present.  Pulmonary:     Effort: No respiratory distress.  Abdominal:      General: There is no distension.     Tenderness: There is no abdominal tenderness.  Genitourinary:    Comments: DRE with no gross blood or melena Musculoskeletal:        General: Normal range of motion.     Cervical back: Normal range of motion.  Skin:    General: Skin is warm.  Neurological:     Mental Status: She is alert. Mental status is at baseline.     IMPRESSION / MDM / ASSESSMENT AND PLAN / ED COURSE  I reviewed the triage vital signs and the nursing notes.  On chart review patient had a recent C. difficile and GI pathogen panel that was obtained.  She has positive for Giardia and Salmonella on 07/25/2022.  She was treated with Levaquin and Flagyl.  Differential diagnosis including infectious diarrhea, inflammatory diarrhea, C. difficile, GI bleed, dehydration.  No left lower quadrant or abdominal tenderness palpation have a low suspicion for intra-abdominal abscess or need for CT scan.  Low suspicion for acute diverticulitis.  Type and screened.  Mild  tachycardia on arrival  Sinus tachycardia while on cardiac telemetry.   LABS (all labs ordered are listed, but only abnormal results are displayed) Labs interpreted as -    Labs Reviewed  C DIFFICILE QUICK SCREEN W PCR REFLEX   - Abnormal; Notable for the following components:      Result Value   C Diff antigen POSITIVE (*)    All other components within normal limits  CBC WITH DIFFERENTIAL/PLATELET - Abnormal; Notable for the following components:   RBC 5.55 (*)    Hemoglobin 15.9 (*)    HCT 47.4 (*)    All other components within normal limits  COMPREHENSIVE METABOLIC PANEL - Abnormal; Notable for the following components:   Glucose, Bld 119 (*)    AST 48 (*)    ALT 57 (*)    All other components within normal limits  GASTROINTESTINAL PANEL BY PCR, STOOL (REPLACES STOOL CULTURE)  CLOSTRIDIUM DIFFICILE BY PCR, REFLEXED  PROTIME-INR  MAGNESIUM  TYPE AND SCREEN  TYPE AND SCREEN     MDM  C. difficile  came back positive.  Mild leukocytosis.  Creatinine at baseline.  Stable hemoglobin level.  Most likely the etiology of her diarrhea.  No significant findings of a GI bleed.  No severe leukocytosis.  No fever.  Hemodynamically stable in the emergency department.  Tachycardia resolved with 1 L of IV fluids.  Able to tolerate p.o.  Given first dose of p.o. vancomycin in the emergency department and tolerated well.  Will start the patient on p.o. vancomycin and patient states that she would be able to go pick up this medication today.  Given a prescription for antiemetics.  Discussed staying hydrated and increasing oral hydration.  Discussed calling primary care physician to schedule close follow-up appointment and return precautions to the emergency department.  No questions at time of discharge.     PROCEDURES:  Critical Care performed: No  Procedures  Patient's presentation is most consistent with acute complicated illness / injury requiring diagnostic workup.   MEDICATIONS ORDERED IN ED: Medications  lactated ringers bolus 1,000 mL (0 mLs Intravenous Stopped 08/26/22 0947)  ondansetron (ZOFRAN) injection 4 mg (4 mg Intravenous Given 08/26/22 0838)  vancomycin (VANCOCIN) capsule 125 mg (125 mg Oral Given 08/26/22 1008)    FINAL CLINICAL IMPRESSION(S) / ED DIAGNOSES   Final diagnoses:  C. difficile colitis     Rx / DC Orders   ED Discharge Orders          Ordered    ondansetron (ZOFRAN-ODT) 4 MG disintegrating tablet  Every 8 hours PRN        08/26/22 1011    vancomycin (VANCOCIN) 125 MG capsule  4 times daily        08/26/22 1011             Note:  This document was prepared using Dragon voice recognition software and may include unintentional dictation errors.   Corena Herter, MD 08/26/22 1013

## 2022-08-26 NOTE — ED Triage Notes (Signed)
Patient to ED via POV for Marcia [name] tarry stools that started this AM. Patient states she had this happen a few weeks ago when she was dx with salmonella. Denies abd pain. States she also hasa a headache.

## 2022-08-28 ENCOUNTER — Telehealth: Payer: Self-pay

## 2022-08-28 NOTE — Transitions of Care (Post Inpatient/ED Visit) (Signed)
   08/28/2022  Name: JALYSSA MAMONE MRN: [id number] DOB: [birth year]  Today's TOC FU Call Status: Today's TOC FU Call Status:: Successful TOC FU Call Completed TOC FU Call Complete Date: 08/28/22  Transition Care Management Follow-up Telephone Call Discharge Facility: [facility]) Type of Discharge: Emergency Department Reason for ED Visit: Other: How have you been since you were released from the hospital?: Better Any questions or concerns?: No  Items Reviewed: Did you receive and understand the discharge instructions provided?: Yes Medications obtained,verified, and reconciled?: Yes (Medications Reviewed) Any new allergies since your discharge?: No Dietary orders reviewed?: NA Do you have support at home?: Yes People in Home: alone, child(ren), adult  Medications Reviewed Today: Medications Reviewed Today   Medications were not reviewed in this encounter     Home Care and Equipment/Supplies: Were Home Health Services Ordered?: NA Any new equipment or medical supplies ordered?: NA  Functional Questionnaire: Do you need assistance with bathing/showering or dressing?: No Do you need assistance with meal preparation?: No Do you need assistance with eating?: No Do you have difficulty maintaining continence: No Do you need assistance with getting out of bed/getting out of a chair/moving?: No Do you have difficulty managing or taking your medications?: No  Follow up appointments reviewed: PCP Follow-up appointment confirmed?: NA MD Provider Line Number:[phone] Given: No Specialist Hospital Follow-up appointment confirmed?: NA Do you need transportation to your follow-up appointment?: No Do you understand care options if your condition(s) worsen?: Yes-patient verbalized understanding    SIGNATURE Malen Gauze, CMA

## 2022-08-28 NOTE — Telephone Encounter (Signed)
-----   Message from [facility] R sent at 08/27/2022  9:54 AM EDT ----- Patient needs TOC call completed please.

## 2022-09-10 NOTE — Progress Notes (Signed)
Diarrhea, concern for infectious diarrhea

## 2022-09-16 ENCOUNTER — Other Ambulatory Visit: Payer: Self-pay | Admitting: Nurse Practitioner

## 2022-09-17 NOTE — Telephone Encounter (Signed)
Requested Prescriptions  Pending Prescriptions Disp Refills   Lancets (ONETOUCH DELICA PLUS LANCET33G) MISC [Pharmacy Med Name: ONETOUCH DELICA PLUS 33G LANCT] 100 each 0    Sig: CHECK BLOOD SUGAR ONCE DAILY.     Endocrinology: Diabetes - Testing Supplies Passed - 09/16/2022  2:45 PM      Passed - Valid encounter within last 12 months    Recent Outpatient Visits           4 weeks ago Coarse tremors   [place] [facility] Mecum, Erin E, PA-C   2 months ago 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[facility])   [place] [facility] Larae Grooms, NP   4 months ago Anxiety   [place] [facility] Larae Grooms, NP   6 months ago Aortic atherosclerosis [facility])   [place] [facility] Larae Grooms, NP   7 months ago Nausea and vomiting, unspecified vomiting type   [facility] Caro Laroche, DO       Future Appointments             In 1 month Larae Grooms, NP [place] [facility]

## 2022-10-14 ENCOUNTER — Emergency Department: Payer: 59

## 2022-10-14 ENCOUNTER — Other Ambulatory Visit: Payer: Self-pay

## 2022-10-14 ENCOUNTER — Emergency Department
Admission: EM | Admit: 2022-10-14 | Discharge: 2022-10-14 | Disposition: A | Discharge status: Home / Self Care | Payer: 59 | Attending: Emergency Medicine | Admitting: Emergency Medicine

## 2022-10-14 DIAGNOSIS — I959 Hypotension, unspecified: Secondary | ICD-10-CM | POA: Insufficient documentation

## 2022-10-14 DIAGNOSIS — E1140 Type 2 diabetes mellitus with diabetic neuropathy, unspecified: Secondary | ICD-10-CM | POA: Diagnosis not present

## 2022-10-14 DIAGNOSIS — R55 Syncope and collapse: Secondary | ICD-10-CM | POA: Diagnosis not present

## 2022-10-14 DIAGNOSIS — R9431 Abnormal electrocardiogram [ECG] [EKG]: Secondary | ICD-10-CM | POA: Insufficient documentation

## 2022-10-14 DIAGNOSIS — R42 Dizziness and giddiness: Secondary | ICD-10-CM | POA: Diagnosis present

## 2022-10-14 DIAGNOSIS — Z7984 Long term (current) use of oral hypoglycemic drugs: Secondary | ICD-10-CM | POA: Insufficient documentation

## 2022-10-14 DIAGNOSIS — Z79899 Other long term (current) drug therapy: Secondary | ICD-10-CM | POA: Diagnosis not present

## 2022-10-14 DIAGNOSIS — I10 Essential (primary) hypertension: Secondary | ICD-10-CM | POA: Diagnosis not present

## 2022-10-14 LAB — COMPREHENSIVE METABOLIC PANEL
ALT: 26 U/L (ref 0–44)
AST: 34 U/L (ref 15–41)
Albumin: 4.1 g/dL (ref 3.5–5.0)
Alkaline Phosphatase: 44 U/L (ref 38–126)
Anion gap: 17 — ABNORMAL HIGH (ref 5–15)
BUN: 19 mg/dL (ref 8–23)
CO2: 25 mmol/L (ref 22–32)
Calcium: 9 mg/dL (ref 8.9–10.3)
Chloride: 93 mmol/L — ABNORMAL LOW (ref 98–111)
Creatinine, Ser: 1.07 mg/dL — ABNORMAL HIGH (ref 0.44–1.00)
GFR, Estimated: 56 mL/min — ABNORMAL LOW (ref >60)
Glucose, Bld: 116 mg/dL — ABNORMAL HIGH (ref 70–99)
Potassium: 3.4 mmol/L — ABNORMAL LOW (ref 3.5–5.1)
Sodium: 135 mmol/L (ref 135–145)
Total Bilirubin: 1.4 mg/dL — ABNORMAL HIGH (ref 0.3–1.2)
Total Protein: 7.3 g/dL (ref 6.5–8.1)

## 2022-10-14 LAB — CBC WITH DIFFERENTIAL/PLATELET
Abs Immature Granulocytes: 0.03 10*3/uL (ref 0.00–0.07)
Basophils Absolute: 0 10*3/uL (ref 0.0–0.1)
Basophils Relative: 0 %
Eosinophils Absolute: 0 10*3/uL (ref 0.0–0.5)
Eosinophils Relative: 0 %
HCT: 44.3 % (ref 36.0–46.0)
Hemoglobin: 14.9 g/dL (ref 12.0–15.0)
Immature Granulocytes: 0 %
Lymphocytes Relative: 21 %
Lymphs Abs: 1.8 10*3/uL (ref 0.7–4.0)
MCH: 28.8 pg (ref 26.0–34.0)
MCHC: 33.6 g/dL (ref 30.0–36.0)
MCV: 85.5 fL (ref 80.0–100.0)
Monocytes Absolute: 0.6 10*3/uL (ref 0.1–1.0)
Monocytes Relative: 6 %
Neutro Abs: 6.5 10*3/uL (ref 1.7–7.7)
Neutrophils Relative %: 73 %
Platelets: 333 10*3/uL (ref 150–400)
RBC: 5.18 MIL/uL — ABNORMAL HIGH (ref 3.87–5.11)
RDW: 13.6 % (ref 11.5–15.5)
WBC: 8.9 10*3/uL (ref 4.0–10.5)
nRBC: 0 % (ref 0.0–0.2)

## 2022-10-14 LAB — PHOSPHORUS: Phosphorus: 3.6 mg/dL (ref 2.5–4.6)

## 2022-10-14 LAB — TROPONIN I (HIGH SENSITIVITY)
Troponin I (High Sensitivity): 3 ng/L (ref <18)
Troponin I (High Sensitivity): <2 ng/L (ref <18)

## 2022-10-14 LAB — MAGNESIUM: Magnesium: 1.5 mg/dL — ABNORMAL LOW (ref 1.7–2.4)

## 2022-10-14 LAB — LACTIC ACID, PLASMA
Lactic Acid, Venous: 3.1 mmol/L (ref 0.5–1.9)
Lactic Acid, Venous: 1.6 mmol/L (ref 0.5–1.9)

## 2022-10-14 LAB — TSH: TSH: 2.293 u[IU]/mL (ref 0.350–4.500)

## 2022-10-14 LAB — CBG MONITORING, ED: Glucose-Capillary: 115 mg/dL — ABNORMAL HIGH (ref 70–99)

## 2022-10-14 MED ORDER — SODIUM CHLORIDE 0.9 % IV SOLN
2.0000 g | Freq: Once | INTRAVENOUS | Status: AC
  Administered 2022-10-14: 2 g via INTRAVENOUS
  Filled 2022-10-14: qty 12.5

## 2022-10-14 MED ORDER — LACTATED RINGERS IV BOLUS
1000.0000 mL | Freq: Once | INTRAVENOUS | Status: AC
  Administered 2022-10-14: 1000 mL via INTRAVENOUS

## 2022-10-14 MED ORDER — VANCOMYCIN HCL IN DEXTROSE 1-5 GM/200ML-% IV SOLN
1000.0000 mg | Freq: Once | INTRAVENOUS | Status: DC
  Filled 2022-10-14: qty 200

## 2022-10-14 MED ORDER — SODIUM CHLORIDE 0.9 % IV SOLN
500.0000 mL | Freq: Once | INTRAVENOUS | Status: AC
  Administered 2022-10-14: 500 mL via INTRAVENOUS

## 2022-10-14 NOTE — Discharge Summary (Signed)
Physician Discharge Summary   Patient: Marcia [name] MRN: [id number] DOB: [birth year]  Admit date:     10/14/2022  Discharge date: 10/14/22  Discharge Physician: Emeline General   PCP: Larae Grooms, NP   Recommendations at discharge:    Check blood pressure 2 times a day in the evening and in the morning, and write the reading down show PCP in 1 to 2 weeks.  Take hydrochlorothiazide only if SBP> 140 or DBP> 90.  Discontinue clonidine and recommend to discuss with PCP about other medication to treat her rash  Discharge Diagnoses:  Near syncope.  Hospital course  Patient has a history of hypertension and IIDM, diabetic neuropathy, hot flash on as needed clonidine and anxiety/depression presented with near syncope's.  Appears that the patient has had similar episode since July of this year.  Workup showed the patient likely has dehydration today as creatinine level 1.0 compared to baseline 0.7 which is about 50% of increased and her blood pressure was in the 60s on arrival.  Blood pressure significantly improved after IV hydration and no orthostatic hypotension.  Lactic acid 3.0, no significant infection source was found patient denies any cough no dysuria no rash.  And no fever.  Initial AP chest x-ray suspicious for left lower lobe infiltrate however repeated PA and lateral film showed no such lung infiltrates.  Low suspicion for pneumonia.  Review of patient's medication list found the patient takes as needed clonidine for hot rash on as needed basis usually 2-3 times a day and patient did take clonidine this morning before she had a near syncope episode.  And patient also takes HCTZ for high blood pressure.  She checks her blood pressure regularly and most of her blood pressure reading in lower 100/40.  Besides patient also on multiple CNS medication including SSRI, gabapentin increased the chance of polypharmacy.  Plan is for patient to hold off all her BP meds including clonidine (even  though his as needed) and HCTZ and check her blood pressure 2 times a day morning and evening and only if her BP more than 140/90 she is instructed to take 1 hydrochlorothiazide p.o. otherwise she should see her PCP in 1 week and go through the blood pressure catalog.  Patient agreed with plan.  In any case patient feels lightheaded blurry vision and her blood pressure less than 90/60 she should return to ED.  Patient agreed.       Pain control - [employer] Controlled Substance Reporting System database was reviewed. and patient was instructed, not to drive, operate heavy machinery, perform activities at heights, swimming or participation in water activities or provide baby-sitting services while on Pain, Sleep and Anxiety Medications; until their outpatient Physician has advised to do so again. Also recommended to not to take more than prescribed Pain, Sleep and Anxiety Medications.  Consultants: None Procedures performed: None Disposition: Home Diet recommendation:  Cardiac and Carb modified diet DISCHARGE MEDICATION: Allergies as of 10/14/2022   No Known Allergies      Medication List     STOP taking these medications    cloNIDine 0.1 MG tablet Commonly known as: CATAPRES   hydrochlorothiazide 25 MG tablet Commonly known as: HYDRODIURIL   meloxicam 15 MG tablet Commonly known as: MOBIC   metroNIDAZOLE 500 MG tablet Commonly known as: FLAGYL   OneTouch Delica Plus Lancet33G Misc   potassium chloride SA 20 MEQ tablet Commonly known as: KLOR-CON M       TAKE these medications  Belsomra 20 MG Tabs Generic drug: Suvorexant Take 1 tablet (20 mg total) by mouth at bedtime as needed.   buPROPion 150 MG 24 hr tablet Commonly known as: Wellbutrin XL Take 1 tablet (150 mg total) by mouth daily.   citalopram 20 MG tablet Commonly known as: CELEXA Take 1 tablet (20 mg total) by mouth daily.   colchicine 0.6 MG tablet Take 1 tablet (0.6 mg total) by mouth daily.    fluticasone 50 MCG/ACT nasal spray Commonly known as: FLONASE Place 2 sprays into both nostrils daily.   gabapentin 100 MG capsule Commonly known as: NEURONTIN Take 1 capsule (100 mg total) by mouth daily.   gabapentin 300 MG capsule Commonly known as: NEURONTIN Take 1 capsule (300 mg total) by mouth 2 (two) times daily.   hydrOXYzine 25 MG capsule Commonly known as: VISTARIL TAKE 1 CAPSULE BY MOUTH THREE TIMES DAILY AS NEEDED FOR ANXIETY   hyoscyamine 0.125 MG tablet Commonly known as: LEVSIN Take 0.125 mg by mouth every 6 (six) hours as needed.   metFORMIN 500 MG tablet Commonly known as: GLUCOPHAGE Take 2 tablets (1,000 mg total) by mouth 2 (two) times daily with a meal.   omeprazole 40 MG capsule Commonly known as: PRILOSEC Take 40 mg by mouth daily.   ondansetron 4 MG disintegrating tablet Commonly known as: ZOFRAN-ODT Take 1 tablet (4 mg total) by mouth every 8 (eight) hours as needed for nausea or vomiting.   ondansetron 4 MG tablet Commonly known as: Zofran Take 1 tablet (4 mg total) by mouth every 8 (eight) hours as needed for nausea or vomiting.   pravastatin 40 MG tablet Commonly known as: PRAVACHOL Take 1 tablet (40 mg total) by mouth daily.   tiZANidine 4 MG tablet Commonly known as: Zanaflex Take 1 tablet (4 mg total) by mouth at bedtime as needed for muscle spasms.   triamcinolone cream 0.1 % Commonly known as: KENALOG Apply 1 Application topically 2 (two) times daily.        Discharge Exam: Filed Weights   10/14/22 1256  Weight: 68 kg   Subjective:  Eyes: PERRL, lids and conjunctivae normal ENMT: Mucous membranes are moist. Posterior pharynx clear of any exudate or lesions.Normal dentition.  Neck: normal, supple, no masses, no thyromegaly Respiratory: clear to auscultation bilaterally, no wheezing, no crackles. Normal respiratory effort. No accessory muscle use.  Cardiovascular: Regular rate and rhythm, no murmurs / rubs / gallops. No  extremity edema. 2+ pedal pulses. No carotid bruits.  Abdomen: no tenderness, no masses palpated. No hepatosplenomegaly. Bowel sounds positive.  Musculoskeletal: no clubbing / cyanosis. No joint deformity upper and lower extremities. Good ROM, no contractures. Normal muscle tone.  Skin: no rashes, lesions, ulcers. No induration Neurologic: CN 2-12 grossly intact. Sensation intact, DTR normal.  Slight weakness on left side compared to right side Psychiatric: Normal judgment and insight. Alert and oriented x 3. Normal mood.    Condition at discharge: good  The results of significant diagnostics from this hospitalization (including imaging, microbiology, ancillary and laboratory) are listed below for reference.   Imaging Studies: DG Chest 2 View  Result Date: 10/14/2022 CLINICAL DATA:  Pneumonia. EXAM: CHEST - 2 VIEW COMPARISON:  Same day. FINDINGS: The heart size and mediastinal contours are within normal limits. Both lungs are clear. The visualized skeletal structures are unremarkable. IMPRESSION: No active cardiopulmonary disease. Electronically Signed   By: Lupita Raider M.D.   On: 10/14/2022 17:55   DG Chest [facility] 1 View  Result  Date: 10/14/2022 CLINICAL DATA:  Questionable sepsis.  Hypotension.  Dizziness. EXAM: PORTABLE CHEST 1 VIEW COMPARISON:  12/09/2021 FINDINGS: Heart size is normal. Mediastinal shadows are normal. The right lung is clear. Question minimal patchy infiltrate at the left base. This is not definite. Consider two-view chest radiography. No abnormal bone finding. IMPRESSION: Question minimal patchy infiltrate at the left base. This is not definite. Consider two-view chest radiography. Electronically Signed   By: Paulina Fusi M.D.   On: 10/14/2022 15:37    Microbiology: Results for orders placed or performed during the hospital encounter of 08/26/22  C Difficile Quick Screen w PCR reflex     Status: Abnormal   Collection Time: 08/26/22  8:34 AM   Specimen: Stool  Result  Value Ref Range Status   C Diff antigen POSITIVE (A) NEGATIVE Final   C Diff toxin NEGATIVE NEGATIVE Final   C Diff interpretation Results are indeterminate. See PCR results.  Final    Comment: Performed at [facility], [address] [place], [place] [address]  C. Diff by PCR, Reflexed     Status: Abnormal   Collection Time: 08/26/22  8:34 AM  Result Value Ref Range Status   Toxigenic C. Difficile by PCR POSITIVE (A) NEGATIVE Final    Comment: Positive for toxigenic C. difficile with little to no toxin production. Only treat if clinical presentation suggests symptomatic illness. Performed at [facility], [address] [place], [place] [address]   Gastrointestinal Panel by PCR , Stool     Status: None   Collection Time: 08/26/22  8:37 AM   Specimen: Stool  Result Value Ref Range Status   Campylobacter species NOT DETECTED NOT DETECTED Final   Plesimonas shigelloides NOT DETECTED NOT DETECTED Final   Salmonella species NOT DETECTED NOT DETECTED Final   Yersinia enterocolitica NOT DETECTED NOT DETECTED Final   Vibrio species NOT DETECTED NOT DETECTED Final   Vibrio cholerae NOT DETECTED NOT DETECTED Final   Enteroaggregative E coli (EAEC) NOT DETECTED NOT DETECTED Final   Enteropathogenic E coli (EPEC) NOT DETECTED NOT DETECTED Final   Enterotoxigenic E coli (ETEC) NOT DETECTED NOT DETECTED Final   Shiga like toxin producing E coli (STEC) NOT DETECTED NOT DETECTED Final   Shigella/Enteroinvasive E coli (EIEC) NOT DETECTED NOT DETECTED Final   Cryptosporidium NOT DETECTED NOT DETECTED Final   Cyclospora cayetanensis NOT DETECTED NOT DETECTED Final   Entamoeba histolytica NOT DETECTED NOT DETECTED Final   Giardia lamblia NOT DETECTED NOT DETECTED Final   Adenovirus F40/41 NOT DETECTED NOT DETECTED Final   Astrovirus NOT DETECTED NOT DETECTED Final   Norovirus GI/GII NOT DETECTED NOT DETECTED Final   Rotavirus A NOT DETECTED NOT DETECTED Final   Sapovirus (I,  II, IV, and V) NOT DETECTED NOT DETECTED Final    Comment: Performed at [facility], [address] [place], [place] [address]    Labs: CBC: Recent Labs  Lab 10/14/22 1317  WBC 8.9  NEUTROABS 6.5  HGB 14.9  HCT 44.3  MCV 85.5  PLT 333   Basic Metabolic Panel: Recent Labs  Lab 10/14/22 1317  NA 135  K 3.4*  CL 93*  CO2 25  GLUCOSE 116*  BUN 19  CREATININE 1.07*  CALCIUM 9.0   Liver Function Tests: Recent Labs  Lab 10/14/22 1317  AST 34  ALT 26  ALKPHOS 44  BILITOT 1.4*  PROT 7.3  ALBUMIN 4.1   CBG: Recent Labs  Lab 10/14/22 1310  GLUCAP 115*  Discharge time spent: greater than 30 minutes.  Signed: Emeline General, MD [facility] 10/14/2022

## 2022-10-14 NOTE — ED Notes (Signed)
Pt verbalizes understanding of discharge instructions. Opportunity for questioning and answers were provided. Pt discharged from ED to home with family.

## 2022-10-14 NOTE — ED Provider Notes (Signed)
Care assumed of patient from outgoing provider.  See their note for initial history, exam and plan.  Clinical Course as of 10/14/22 1528  Mon Oct 14, 2022  1527 Presents to the emergency department for significant hypotension.  Unclear source of an infectious process.  Lactic acid elevated at 3.1.  No significant leukocytosis.  Chest x-ray with no obvious focal findings consistent with pneumonia.  Currently waiting for UA.  Started on IV antibiotics.  Blood pressure improved following 1 L of IV fluids.  Ordered 30 cc/kg of IV fluids.  Improvement of perfusion on sepsis reevaluation. [SM]    Clinical Course User Index [SM] Corena Herter, MD     Corena Herter, MD 10/14/22 [phone]

## 2022-10-14 NOTE — ED Provider Notes (Deleted)
Care assumed of patient from outgoing provider.  See their note for initial history, exam and plan.  Clinical Course as of 10/14/22 1603  Mon Oct 14, 2022  1527 Presents to the emergency department for significant hypotension.  Unclear source of an infectious process.  Lactic acid elevated at 3.1.  No significant leukocytosis.  Chest x-ray with no obvious focal findings consistent with pneumonia.  Currently waiting for UA.  Started on IV antibiotics.  Blood pressure improved following 1 L of IV fluids.  Ordered 30 cc/kg of IV fluids.  Improvement of perfusion on sepsis reevaluation. [SM]    Clinical Course User Index [SM] Corena Herter, MD     Corena Herter, MD 10/14/22 [phone]

## 2022-10-14 NOTE — ED Triage Notes (Signed)
Pt at work and was feeling lightheaded and dizzy. Staff checked her first BP 84/60 second 56/44. Pt appears pale, sitting in w/c. Brought in by son

## 2022-10-14 NOTE — ED Provider Notes (Signed)
[facility] Provider Note    Event Date/Time   First MD Initiated Contact with Patient 10/14/22 1323     (approximate)   History   Hypotension   HPI  Marcia [name] is a 72 y.o. female who presents to the emergency department today because of concerns for dizziness and low blood pressure.  Symptoms started Tuesday.  She first really noticed it while she was in the shower.  She became dizzy.  She was able to make it to work however at work her symptoms continued.  She works at a living facility and they were able to check her blood pressure and found it to be quite low.  Patient denies history of low blood pressure.  She in fact has high blood pressure and is on medication.  No recent changes to her medication.  The patient denies any recent fevers or chills.     Physical Exam   Triage Vital Signs: ED Triage Vitals  Encounter Vitals Group     BP 10/14/22 1306 (!) 60/45     Systolic BP Percentile --      Diastolic BP Percentile --      Pulse Rate 10/14/22 1319 (!) 56     Resp 10/14/22 1319 18     Temp 10/14/22 1319 98.2 F (36.8 C)     Temp Source 10/14/22 1319 Oral     SpO2 10/14/22 1319 95 %     Weight 10/14/22 1256 150 lb (68 kg)     Height 10/14/22 1256 5\' 9"  (1.753 m)     Head Circumference --      Peak Flow --      Pain Score 10/14/22 1256 0     Pain Loc --      Pain Education --      Exclude from Growth Chart --     Most recent vital signs: Vitals:   10/14/22 1306 10/14/22 1319  BP: (!) 60/45 98/69  Pulse:  (!) 56  Resp:  18  Temp:  98.2 F (36.8 C)  SpO2:  95%   General: Awake, alert, oriented. CV:  Good peripheral perfusion. Regular rate and rhythm Resp:  Normal effort. Lungs clear. Abd:  No distention.    ED Results / Procedures / Treatments   Labs (all labs ordered are listed, but only abnormal results are displayed) Labs Reviewed  LACTIC ACID, PLASMA - Abnormal; Notable for the following components:      Result  Value   Lactic Acid, Venous 3.1 (*)    All other components within normal limits  COMPREHENSIVE METABOLIC PANEL - Abnormal; Notable for the following components:   Potassium 3.4 (*)    Chloride 93 (*)    Glucose, Bld 116 (*)    Creatinine, Ser 1.07 (*)    Total Bilirubin 1.4 (*)    GFR, Estimated 56 (*)    Anion gap 17 (*)    All other components within normal limits  CBC WITH DIFFERENTIAL/PLATELET - Abnormal; Notable for the following components:   RBC 5.18 (*)    All other components within normal limits  MAGNESIUM - Abnormal; Notable for the following components:   Magnesium 1.5 (*)    All other components within normal limits  CBG MONITORING, ED - Abnormal; Notable for the following components:   Glucose-Capillary 115 (*)    All other components within normal limits  CULTURE, BLOOD (ROUTINE X 2)  CULTURE, BLOOD (ROUTINE X 2)  LACTIC ACID, PLASMA  TSH  PHOSPHORUS  TROPONIN I (HIGH SENSITIVITY)  TROPONIN I (HIGH SENSITIVITY)     EKG  I, Phineas Semen, attending physician, personally viewed and interpreted this EKG  EKG Time: 1304 Rate: 64 Rhythm: normal sinus rhythm Axis: normal Intervals: qtc 513 QRS: narrow ST changes: no st elevation Impression: abnormal ekg    RADIOLOGY I independently interpreted and visualized the CXR. My interpretation: No pneumonia Radiology interpretation: pending at time of sign out    PROCEDURES:  Critical Care performed: No   MEDICATIONS ORDERED IN ED: Medications  0.9 %  sodium chloride infusion (500 mLs Intravenous New Bag/Given 10/14/22 1316)     IMPRESSION / MDM / ASSESSMENT AND PLAN / ED COURSE  I reviewed the triage vital signs and the nursing notes.                              Differential diagnosis includes, but is not limited to, infection, dehydration, anemia, vasovagal  Patient's presentation is most consistent with acute presentation with potential threat to life or bodily function.   The patient is  on the cardiac monitor to evaluate for evidence of arrhythmia and/or significant heart rate changes.  Patient presented to the emergency department today after concerns for dizziness and found to have low blood pressure.  At the time my exam she states she is feeling some improvement.  Blood work was concerning for initial lactic acidosis.  Because of this patient was given IV fluids.  Would have concern for possible infection although unclear etiology at this time.  Given elevated lactic acidosis and hypotension will plan on admission to the hospital service.   FINAL CLINICAL IMPRESSION(S) / ED DIAGNOSES   Final diagnoses:  Hypotension, unspecified hypotension type     Note:  This document was prepared using Dragon voice recognition software and may include unintentional dictation errors.    Phineas Semen, MD 10/15/22 [id number]

## 2022-10-15 ENCOUNTER — Telehealth: Payer: Self-pay | Admitting: Nurse Practitioner

## 2022-10-15 NOTE — Telephone Encounter (Signed)
Pt would like a Rx for a blood pressure cuff.  Pt went to the hospital yesterday and her bp was 80/44.  The Dr advised pt she needs to check her bp 2X /day (Pt states she does not have the money to buy one)  [facility] - [place], [place] - [address]

## 2022-10-16 ENCOUNTER — Other Ambulatory Visit: Payer: Self-pay | Admitting: Physician Assistant

## 2022-10-16 DIAGNOSIS — E1159 Type 2 diabetes mellitus with other circulatory complications: Secondary | ICD-10-CM

## 2022-10-16 MED ORDER — BLOOD PRESSURE MONITOR AUTOMAT DEVI
1.0000 | Freq: Two times a day (BID) | 0 refills | Status: AC
Start: 2022-10-16 — End: ?

## 2022-10-16 NOTE — Telephone Encounter (Signed)
Patient notified via VM

## 2022-10-17 ENCOUNTER — Telehealth: Payer: Self-pay | Admitting: Medical Oncology

## 2022-10-17 ENCOUNTER — Other Ambulatory Visit: Payer: Self-pay | Admitting: Nurse Practitioner

## 2022-10-17 LAB — CULTURE, BLOOD (ROUTINE X 2): Special Requests: ADEQUATE

## 2022-10-17 NOTE — Telephone Encounter (Signed)
Request is too soon for refill, last refill 08/06/22 for 90 and 1 refill.E-Prescribing Status: Receipt confirmed by pharmacy (08/06/2022  5:46 PM EDT).  Requested Prescriptions  Pending Prescriptions Disp Refills   hydrOXYzine (VISTARIL) 25 MG capsule [Pharmacy Med Name: HYDROXYZINE PAM 25 MG CAP] 90 capsule 0    Sig: TAKE 1 CAPSULE BY MOUTH THREE TIMES DAILY AS NEEDED FOR ANXIETY     Ear, Nose, and Throat:  Antihistamines 2 Failed - 10/17/2022  9:11 AM      Failed - Cr in normal range and within 360 days    Creatinine, Ser  Date Value Ref Range Status  10/14/2022 1.07 (H) 0.44 - 1.00 mg/dL Final         Passed - Valid encounter within last 12 months    Recent Outpatient Visits           1 month ago Coarse tremors   [place] [facility] Mecum, Erin E, PA-C   3 months ago 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[facility])   [place] [facility] Larae Grooms, NP   5 months ago Anxiety   [place] [facility] Larae Grooms, NP   7 months ago Aortic atherosclerosis [facility])   [place] [facility] Larae Grooms, NP   8 months ago Nausea and vomiting, unspecified vomiting type   [facility] Caro Laroche, DO       Future Appointments             In 1 week Larae Grooms, NP [place] [facility], [facility]

## 2022-10-17 NOTE — Telephone Encounter (Signed)
Contacted Dr Scotty Court in reference to positive blood culture advised that pt was admitted initially and had a pretty simple hospital course, so per Dr Scotty Court we can attribute this to contamination, nothing further to do.

## 2022-10-18 ENCOUNTER — Other Ambulatory Visit: Payer: Self-pay | Admitting: Nurse Practitioner

## 2022-10-18 NOTE — Telephone Encounter (Signed)
Called pharmacy - refill remaining they will process refill.

## 2022-10-18 NOTE — Telephone Encounter (Signed)
Requested Prescriptions  Refused Prescriptions Disp Refills   hydrOXYzine (VISTARIL) 25 MG capsule [Pharmacy Med Name: HYDROXYZINE PAM 25 MG CAP] 90 capsule 0    Sig: TAKE 1 CAPSULE BY MOUTH THREE TIMES DAILY AS NEEDED FOR ANXIETY     Ear, Nose, and Throat:  Antihistamines 2 Failed - 10/18/2022 10:52 AM      Failed - Cr in normal range and within 360 days    Creatinine, Ser  Date Value Ref Range Status  10/14/2022 1.07 (H) 0.44 - 1.00 mg/dL Final         Passed - Valid encounter within last 12 months    Recent Outpatient Visits           2 months ago Coarse tremors   [place] [facility] Mecum, Erin E, PA-C   3 months ago 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[facility])   [place] [facility] Larae Grooms, NP   5 months ago Anxiety   [place] [facility] Larae Grooms, NP   7 months ago Aortic atherosclerosis [facility])   [place] [facility] Larae Grooms, NP   8 months ago Nausea and vomiting, unspecified vomiting type   [facility] Caro Laroche, DO       Future Appointments             In 1 week Larae Grooms, NP [place] [facility], [facility]

## 2022-10-19 LAB — CULTURE, BLOOD (ROUTINE X 2): Culture: NO GROWTH

## 2022-10-24 LAB — MISC LABCORP TEST (SEND OUT): Labcorp test code: 8664

## 2022-10-28 LAB — BACTERIAL ORGANISM REFLEX

## 2022-10-29 ENCOUNTER — Ambulatory Visit: Payer: 59 | Admitting: Nurse Practitioner

## 2022-10-30 ENCOUNTER — Ambulatory Visit: Payer: Self-pay

## 2022-10-30 NOTE — Telephone Encounter (Signed)
Summary: vaccine concern   The patient would like to discuss receiving the COVID vaccination prior to scheduling  The patient shares that they are uncertain of whether or not to receive their 6th vaccination and would like to be advised when possible     Chief Complaint: Questions about COVID 19 vaccination. Symptoms: n/a Frequency: n/a Pertinent Negatives: Patient denies  Disposition: [] ED /[] Urgent Care (no appt availability in office) / [] Appointment(In office/virtual)/ []  [facility] [facility]/ [x] Home Care/ [] Refused Recommended Disposition /[] [place] [facility]/ []  Follow-up with PCP Additional Notes: Verbalizes understanding.  Reason for Disposition  COVID-19 vaccine, Frequently Asked Questions (FAQs)  Answer Assessment - Initial Assessment Questions 1. MAIN CONCERN OR SYMPTOM:  "What is your main concern right now?" "What question do you have?" "What's the main symptom you're worried about?" (e.g., fever, pain, redness, swelling)     Should I get COVID Vaccine 2. VACCINE: "What vaccination did you receive?" (e.g., none; AstraZeneca, J&J, Moderna, [employer], other)      [employer] 3. SYMPTOM ONSET: "When did the  begin?" (e.g., not relevant; hours, days)      N/a 4. SYMPTOM SEVERITY: "How bad is it?"      N/a 5. FEVER: "Is there a fever?" If Yes, ask: "What is it, how was it measured, and when did it start?"      No 6. PAST REACTIONS: "Have you reacted to immunizations before?" If Yes, ask: "What happened?"     No 7. OTHER SYMPTOMS: "Do you have any other symptoms?" (e.g., fatigue, headache, joint or muscle pain)     No 8. PREGNANCY: "Is there any chance you are pregnant?" "When was your last menstrual period?"     No  Protocols used: Coronavirus (COVID-19) Vaccine Questions and Reactions-A-AH

## 2022-11-02 LAB — CULTURE, BLOOD (ROUTINE X 2)

## 2022-11-05 ENCOUNTER — Encounter: Payer: Self-pay | Admitting: Pediatrics

## 2022-11-05 ENCOUNTER — Ambulatory Visit (INDEPENDENT_AMBULATORY_CARE_PROVIDER_SITE_OTHER): Payer: 59 | Admitting: Pediatrics

## 2022-11-05 VITALS — BP 138/80 | HR 96 | Temp 98.2°F | Wt 147.8 lb

## 2022-11-05 DIAGNOSIS — E876 Hypokalemia: Secondary | ICD-10-CM

## 2022-11-05 DIAGNOSIS — Z1330 Encounter for screening examination for mental health and behavioral disorders, unspecified: Secondary | ICD-10-CM

## 2022-11-05 DIAGNOSIS — R21 Rash and other nonspecific skin eruption: Secondary | ICD-10-CM

## 2022-11-05 DIAGNOSIS — F331 Major depressive disorder, recurrent, moderate: Secondary | ICD-10-CM

## 2022-11-05 DIAGNOSIS — Z23 Encounter for immunization: Secondary | ICD-10-CM

## 2022-11-05 MED ORDER — SERTRALINE HCL 25 MG PO TABS
25.0000 mg | ORAL_TABLET | Freq: Every day | ORAL | 0 refills | Status: DC
Start: 2022-11-05 — End: 2022-12-17

## 2022-11-05 MED ORDER — TRIAMCINOLONE ACETONIDE 0.1 % EX CREA
1.0000 | TOPICAL_CREAM | Freq: Two times a day (BID) | CUTANEOUS | 0 refills | Status: DC
Start: 2022-11-05 — End: 2023-11-25

## 2022-11-05 NOTE — Progress Notes (Unsigned)
   Office Visit  BP 138/80   Pulse 96   Temp 98.2 F (36.8 C) (Oral)   Wt 147 lb 12.8 oz (67 kg)   SpO2 97%   BMI 21.83 kg/m    Subjective:    Patient ID: Marcia [name], female    DOB: 1950/08/16, 72 y.o.   MRN: [id number]  HPI: Marcia [name] is a 72 y.o. female  Chief Complaint  Patient presents with  . Rash    On the back of the neck and back of heel    Rash   Relevant past medical, surgical, family and social history reviewed and updated as indicated. Interim medical history since our last visit reviewed. Allergies and medications reviewed and updated.  ROS per HPI unless specifically indicated above     Objective:    BP 138/80   Pulse 96   Temp 98.2 F (36.8 C) (Oral)   Wt 147 lb 12.8 oz (67 kg)   SpO2 97%   BMI 21.83 kg/m   Wt Readings from Last 3 Encounters:  11/05/22 147 lb 12.8 oz (67 kg)  10/14/22 150 lb (68 kg)  08/26/22 150 lb (68 kg)     Physical Exam      Assessment & Plan:  Assessment & Plan   Hypokalemia -     Basic metabolic panel  Hypomagnesemia -     Magnesium  Need for vaccination -     Flu Vaccine QUAD High Dose(Fluad) -     Pfizer Comirnaty Covid-19 Vaccine 54yrs & older      Follow up plan: Return in about 2 weeks (around 11/19/2022) for AWV w PCP.  Pualani Borah Howell Pringle, MD

## 2022-11-05 NOTE — Patient Instructions (Addendum)
Start zoloft: - Start 25 mg, take 1/2 tab for 4 days, then full tab - if after 2 weeks, tolerating well, feel free to double up to 50mg  daily

## 2022-11-06 ENCOUNTER — Encounter: Payer: Self-pay | Admitting: Pediatrics

## 2022-11-06 DIAGNOSIS — F331 Major depressive disorder, recurrent, moderate: Secondary | ICD-10-CM | POA: Insufficient documentation

## 2022-11-06 LAB — BASIC METABOLIC PANEL
BUN/Creatinine Ratio: 14 (ref 12–28)
BUN: 10 mg/dL (ref 8–27)
CO2: 25 mmol/L (ref 20–29)
Calcium: 9.6 mg/dL (ref 8.7–10.3)
Chloride: 106 mmol/L (ref 96–106)
Creatinine, Ser: 0.71 mg/dL (ref 0.57–1.00)
Glucose: 111 mg/dL — ABNORMAL HIGH (ref 70–99)
Potassium: 3.9 mmol/L (ref 3.5–5.2)
Sodium: 146 mmol/L — ABNORMAL HIGH (ref 134–144)
eGFR: 91 mL/min/{1.73_m2} (ref >59)

## 2022-11-06 LAB — MAGNESIUM: Magnesium: 1.8 mg/dL (ref 1.6–2.3)

## 2022-11-06 NOTE — Assessment & Plan Note (Signed)
Patient recently d/c celexa. Having breakthrough symptoms. Plan to switch to zoloft 25mg , will try to do expedited increase in dose and if tolerating can double up to 50mg  in 2 weeks vs wait for follow up. No acute safety concerns. Declines therapy referral. Continue welbutrin 150mg  XL.

## 2022-11-15 ENCOUNTER — Telehealth: Payer: Self-pay

## 2022-11-15 NOTE — Telephone Encounter (Signed)
Transition Care Management Follow-up Telephone Call Date of discharge and from where: 10/14/2022 [facility] How have you been since you were released from the hospital? Patient stated she is feeling better. Declined to participate further.  Jaquel Coomer [facility]  [facility], [facility] Guide Direct Dial: [phone]  Website: [email or web address]

## 2022-11-21 ENCOUNTER — Other Ambulatory Visit: Payer: Self-pay | Admitting: Physician Assistant

## 2022-11-21 DIAGNOSIS — G4700 Insomnia, unspecified: Secondary | ICD-10-CM

## 2022-11-21 NOTE — Telephone Encounter (Signed)
Requested medications are due for refill today.  yes  Requested medications are on the active medications list.  yes  Last refill. 08/23/2022 #30 2 rf  Future visit scheduled.   yes  Notes to clinic.  Medication not assigned to a protocol. Please review for refill.    Requested Prescriptions  Pending Prescriptions Disp Refills   BELSOMRA 20 MG TABS [Pharmacy Med Name: BELSOMRA 20 MG TABLET] 30 tablet 0    Sig: Take 1 tablet (20 mg total) by mouth at bedtime as needed.     Off-Protocol Failed - 11/21/2022  5:45 PM      Failed - Medication not assigned to a protocol, review manually.      Passed - Valid encounter within last 12 months    Recent Outpatient Visits           2 weeks ago Moderate episode of recurrent major depressive disorder [facility])   [place] [facility] Jackolyn Confer, MD   3 months ago Coarse tremors   [place] [facility] Mecum, Erin E, PA-C   4 months ago 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[facility])   [place] [facility] Larae Grooms, NP   6 months ago Anxiety   [place] [facility] Larae Grooms, NP   8 months ago Aortic atherosclerosis [facility])   [place] [facility] Larae Grooms, NP       Future Appointments             In 5 days Larae Grooms, NP [place] [facility], [facility]   In 5 days Larae Grooms, NP [facility], [facility]

## 2022-11-26 ENCOUNTER — Ambulatory Visit: Payer: 59 | Admitting: Nurse Practitioner

## 2022-12-02 NOTE — Telephone Encounter (Signed)
Requested medication (s) are due for refill today: yes  Requested medication (s) are on the active medication list: yes    Last refill: 08/23/22  #30  2 refills  Future visit scheduled yes 12/17/22  Notes to clinic: Off protocol, please review.   Previous request of 11/21/22.      Pt calling, is completely out.  Requested Prescriptions  Pending Prescriptions Disp Refills   Suvorexant (BELSOMRA) 20 MG TABS [Pharmacy Med Name: BELSOMRA 20 MG TABLET] 19 tablet 0    Sig: Take 1 tablet (20 mg total) by mouth at bedtime as needed for up to 19 days.     Off-Protocol Failed - 12/02/2022  5:23 PM      Failed - Medication not assigned to a protocol, review manually.      Passed - Valid encounter within last 12 months    Recent Outpatient Visits           3 weeks ago Moderate episode of recurrent major depressive disorder [facility])   [place] [facility] Jackolyn Confer, MD   3 months ago Coarse tremors   [place] [facility] Mecum, Erin E, PA-C   4 months ago 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[facility])   [place] [facility] Larae Grooms, NP   6 months ago Anxiety   [place] [facility] Larae Grooms, NP   8 months ago Aortic atherosclerosis [facility])   [place] [facility] Larae Grooms, NP       Future Appointments             In 2 weeks Larae Grooms, NP [place] [facility], [facility]

## 2022-12-03 ENCOUNTER — Other Ambulatory Visit: Payer: Self-pay | Admitting: Nurse Practitioner

## 2022-12-04 DIAGNOSIS — I959 Hypotension, unspecified: Secondary | ICD-10-CM

## 2022-12-04 NOTE — Telephone Encounter (Signed)
  The original prescription was discontinued on 10/14/2022 by Emeline General, MD for the following reason: Stop Taking at Discharge.   Requested Prescriptions  Pending Prescriptions Disp Refills   gabapentin (NEURONTIN) 100 MG capsule [Pharmacy Med Name: GABAPENTIN 100 MG CAPSULE] 90 capsule 0    Sig: Take 1 capsule (100 mg total) by mouth daily.     Neurology: Anticonvulsants - gabapentin Passed - 12/03/2022  3:59 PM      Passed - Cr in normal range and within 360 days    Creatinine, Ser  Date Value Ref Range Status  11/05/2022 0.71 0.57 - 1.00 mg/dL Final         Passed - Completed PHQ-2 or PHQ-9 in the last 360 days      Passed - Valid encounter within last 12 months    Recent Outpatient Visits           4 weeks ago Moderate episode of recurrent major depressive disorder [facility])   [place] [facility] Jackolyn Confer, MD   3 months ago Coarse tremors   [place] [facility] Mecum, Erin E, PA-C   4 months ago 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[facility])   [place] [facility] Larae Grooms, NP   6 months ago Anxiety   [place] [facility] Larae Grooms, NP   8 months ago Aortic atherosclerosis [facility])   [place] [facility] Larae Grooms, NP       Future Appointments             In 1 week Larae Grooms, NP [place] [facility], [facility]             cloNIDine (CATAPRES) 0.1 MG tablet [Pharmacy Med Name: CLONIDINE HCL 0.1 MG TABLET] 90 tablet 0    Sig: Take 1 tablet (0.1 mg total) by mouth daily.     Cardiovascular:  Alpha-2 Agonists Passed - 12/03/2022  3:59 PM      Passed - Last BP in normal range    BP Readings from Last 1 Encounters:  11/05/22 138/80         Passed - Last Heart Rate in normal range    Pulse Readings from Last 1 Encounters:  11/05/22 96         Passed - Valid encounter within last 6 months    Recent Outpatient Visits            4 weeks ago Moderate episode of recurrent major depressive disorder [facility])   [place] [facility] Jackolyn Confer, MD   3 months ago Coarse tremors   [place] [facility] Mecum, Erin E, PA-C   4 months ago 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[facility])   [place] [facility] Larae Grooms, NP   6 months ago Anxiety   [place] [facility] Larae Grooms, NP   8 months ago Aortic atherosclerosis [facility])   [place] [facility] Larae Grooms, NP       Future Appointments             In 1 week Larae Grooms, NP  [facility], [facility]

## 2022-12-05 ENCOUNTER — Telehealth: Payer: Self-pay | Admitting: Nurse Practitioner

## 2022-12-05 MED ORDER — GABAPENTIN 100 MG PO CAPS: 100.0000 mg | ORAL_CAPSULE | Freq: Every day | ORAL | 1 refills | Status: DC

## 2022-12-05 MED ORDER — GABAPENTIN 300 MG PO CAPS: 300.0000 mg | ORAL_CAPSULE | Freq: Two times a day (BID) | ORAL | 1 refills | Status: DC

## 2022-12-05 NOTE — Telephone Encounter (Signed)
Pt is already scheduled with provider on 12/17/2022 @ 3:00 pm.  Does she need to be scheduled earlier?

## 2022-12-05 NOTE — Telephone Encounter (Signed)
Copied from CRM [id number]. Topic: General - Other >> Dec 05, 2022 10:00 AM Franchot Heidelberg wrote: Reason for CRM: Pt called regarding her refill request of gabapentin and clonidine, she says she is completely out of both prescriptions. Please advise

## 2022-12-05 NOTE — Telephone Encounter (Signed)
Patient was due to follow up with me at the beginning of November.  Please have her make the appt and we can re discuss the clonidine.  I don't see that it was added back to her medications during her appt. I would like her to check her blood pressure at home and bring log to next visit so we can see if she needs it.

## 2022-12-05 NOTE — Telephone Encounter (Signed)
Medication Refill -  Most Recent Primary Care Visit:  Provider: Jackolyn Confer  Department: [facility]  Visit Type: OFFICE VISIT  Date: 11/05/2022  Medication: Pt is calling on the status of the below medications for a 2nd time  gabapentin (NEURONTIN) 100 MG capsule [Pharmacy Med Name: GABAPENTIN 100 MG CAPSULE] 90 capsule 0      Sig: Take 1 capsule (100 mg total) by mouth daily.     cloNIDine (CATAPRES) 0.1 MG tablet [Pharmacy Med Name: CLONIDINE HCL 0.1 MG TABLET] 90 tablet 0      Sig: Take 1 tablet (0.1 mg total) by mouth daily.     Has the patient contacted their pharmacy? Yes (Agent: If no, request that the patient contact the pharmacy for the refill. If patient does not wish to contact the pharmacy document the reason why and proceed with request.) (Agent: If yes, when and what did the pharmacy advise?)  Is this the correct pharmacy for this prescription? Yes If no, delete pharmacy and type the correct one.  This is the patient's preferred pharmacy: [facility] [address], [place] - [address] Phone: [phone]  Fax: [phone]    Has the prescription been filled recently? No  Is the patient out of the medication? Yes  Has the patient been seen for an appointment in the last year OR does the patient have an upcoming appointment? Yes  Can we respond through MyChart? No  Agent: Please be advised that Rx refills may take up to 3 business days. We ask that you follow-up with your pharmacy.

## 2022-12-05 NOTE — Telephone Encounter (Signed)
I sent the prescription for Gabapentin.  I do not see that patient is on clonidine.

## 2022-12-05 NOTE — Telephone Encounter (Signed)
Patient called and advised gabapentin 300 mg was sent to the pharmacy today. She says she also takes gabapentin 100 mg in the middle of the day and is out of that. Advised if she picked up a 3 mo supply around the middle of September, she would not be due for a refill until the middle of December. She says she did pick it up in September, but doesn't have any left. I asked about the clonidine, advising when she was discharged from the hospital it was stopped. She says yes it was along with the hydrochlorothiazide, but when she came in the office the last time her BP was up a little bit so she started back taking the clonidine. Advised I will send this to Cly[facility] Braun for refill.

## 2022-12-12 ENCOUNTER — Other Ambulatory Visit: Payer: Self-pay | Admitting: Nurse Practitioner

## 2022-12-13 NOTE — Telephone Encounter (Signed)
Requested Prescriptions  Pending Prescriptions Disp Refills   metFORMIN (GLUCOPHAGE) 500 MG tablet [Pharmacy Med Name: METFORMIN HCL 500 MG TABLET] 180 tablet 0    Sig: Take 2 tablets (1,000 mg total) by mouth 2 (two) times daily with a meal.     Endocrinology:  Diabetes - Biguanides Failed - 12/12/2022  2:17 PM      Failed - B12 Level in normal range and within 720 days    Vitamin B-12  Date Value Ref Range Status  08/19/2022 >2000 (H) 232 - 1245 pg/mL Final         Passed - Cr in normal range and within 360 days    Creatinine, Ser  Date Value Ref Range Status  11/05/2022 0.71 0.57 - 1.00 mg/dL Final         Passed - HBA1C is between 0 and 7.9 and within 180 days    Hgb A1c MFr Bld  Date Value Ref Range Status  07/09/2022 7.5 (H) 4.8 - 5.6 % Final    Comment:             Prediabetes: 5.7 - 6.4          Diabetes: >6.4          Glycemic control for adults with diabetes: <7.0          Passed - eGFR in normal range and within 360 days    GFR calc Af Amer  Date Value Ref Range Status  12/10/2019 83 >59 mL/min/1.73 Final    Comment:    **In accordance with recommendations from the NKF-ASN Task force,**   [facility] is in the process of updating its eGFR calculation to the   2021 CKD-EPI creatinine equation that estimates kidney function   without a race variable.    GFR, Estimated  Date Value Ref Range Status  10/14/2022 56 (L) >60 mL/min Final    Comment:    (NOTE) Calculated using the CKD-EPI Creatinine Equation (2021)    eGFR  Date Value Ref Range Status  11/05/2022 91 >59 mL/min/1.73 Final         Passed - Valid encounter within last 6 months    Recent Outpatient Visits           1 month ago Moderate episode of recurrent major depressive disorder (HCC)   [place] [facility] Jackolyn Confer, MD   3 months ago Coarse tremors   [place] [facility] Mecum, Erin E, PA-C   5 months ago Type 2 diabetes mellitus with other circulatory  complications [facility])   [place] [facility] Larae Grooms, NP   7 months ago Anxiety   [place] [facility] Larae Grooms, NP   9 months ago Aortic atherosclerosis [facility])   [place] [facility] Larae Grooms, NP       Future Appointments             In 4 days Larae Grooms, NP [place] [facility]            Passed - CBC within normal limits and completed in the last 12 months    WBC  Date Value Ref Range Status  10/14/2022 8.9 4.0 - 10.5 K/uL Final   RBC  Date Value Ref Range Status  10/14/2022 5.18 (H) 3.87 - 5.11 MIL/uL Final   Hemoglobin  Date Value Ref Range Status  10/14/2022 14.9 12.0 - 15.0 g/dL Final  40/98/1191 47.8 11.1 - 15.9  g/dL Final   HCT  Date Value Ref Range Status  10/14/2022 44.3 36.0 - 46.0 % Final   Hematocrit  Date Value Ref Range Status  08/19/2022 44.1 34.0 - 46.6 % Final   MCHC  Date Value Ref Range Status  10/14/2022 33.6 30.0 - 36.0 g/dL Final   [facility]  Date Value Ref Range Status  10/14/2022 28.8 26.0 - 34.0 pg Final   MCV  Date Value Ref Range Status  10/14/2022 85.5 80.0 - 100.0 fL Final  08/19/2022 88 79 - 97 fL Final   No results found for: "PLTCOUNTKUC", "LABPLAT", "POCPLA" RDW  Date Value Ref Range Status  10/14/2022 13.6 11.5 - 15.5 % Final  08/19/2022 13.4 11.7 - 15.4 % Final

## 2022-12-17 ENCOUNTER — Ambulatory Visit (INDEPENDENT_AMBULATORY_CARE_PROVIDER_SITE_OTHER): Payer: 59 | Admitting: Nurse Practitioner

## 2022-12-17 ENCOUNTER — Encounter: Payer: Self-pay | Admitting: Nurse Practitioner

## 2022-12-17 VITALS — BP 118/71 | HR 92 | Temp 98.2°F | Ht 69.0 in | Wt 151.0 lb

## 2022-12-17 DIAGNOSIS — F331 Major depressive disorder, recurrent, moderate: Secondary | ICD-10-CM | POA: Diagnosis not present

## 2022-12-17 DIAGNOSIS — J0110 Acute frontal sinusitis, unspecified: Secondary | ICD-10-CM

## 2022-12-17 MED ORDER — AMOXICILLIN 500 MG PO CAPS: 500.0000 mg | ORAL_CAPSULE | Freq: Two times a day (BID) | ORAL | 0 refills | Status: AC

## 2022-12-17 MED ORDER — SERTRALINE HCL 50 MG PO TABS
50.0000 mg | ORAL_TABLET | Freq: Every day | ORAL | 1 refills | Status: DC
Start: 2022-12-17 — End: 2023-04-07

## 2022-12-17 MED ORDER — METHYLPREDNISOLONE 4 MG PO TBPK: ORAL_TABLET | ORAL | 0 refills | Status: DC

## 2022-12-17 NOTE — Assessment & Plan Note (Signed)
Chronic.  Controlled.  Continue with current medication regimen of Zoloft 50mg  and Wellbutrin.  Refills sent today.    Return to clinic in 3 months for reevaluation.  Call sooner if concerns arise.

## 2022-12-17 NOTE — Progress Notes (Signed)
BP 118/71 (BP Location: Left Arm, Patient Position: Sitting, Cuff Size: Normal)   Pulse 92   Temp 98.2 F (36.8 C) (Oral)   Ht 5\' 9"  (1.753 m)   Wt 151 lb (68.5 kg)   SpO2 97%   BMI 22.30 kg/m    Subjective:    Patient ID: Marcia [name], female    DOB: 1950-09-01, 72 y.o.   MRN: [id number]  HPI: Marcia [name] is a 72 y.o. female  Chief Complaint  Patient presents with   Sinusitis   3 week follow up   mood   MOOD Feels like the Zoloft is working well for her.  She is taking 50mg  which she feels like is working well for her.  Welbutrin working well but wants another medication More tearful than usual No SI/HI Declines psychotherapy   Flowsheet Row Office Visit from 12/17/2022 in [facility]  PHQ-9 Total Score 2         12/17/2022    3:00 PM 11/05/2022    4:18 PM 08/19/2022    3:40 PM 07/09/2022    3:31 PM  GAD 7 : Generalized Anxiety Score  Nervous, Anxious, on Edge 2 2 1  0  Control/stop worrying 0 2 0 0  Worry too much - different things 0 1 0 0  Trouble relaxing 0 1 2 0  Restless 0 0 0 0  Easily annoyed or irritable 0 2 0 0  Afraid - awful might happen 0 0 0 0  Total GAD 7 Score 2 8 3  0  Anxiety Difficulty  Not difficult at all Not difficult at all Not difficult at all   UPPER RESPIRATORY TRACT INFECTION Worst symptom: Fever: no Cough: no Shortness of breath: no Wheezing: no Chest pain: yes, with cough Chest tightness: no Chest congestion: no Nasal congestion: yes Runny nose: yes Post nasal drip: yes Sneezing: yes Sore throat: yes Swollen glands: yes Sinus pressure: yes Headache: yes Face pain: yes Toothache: no Ear pain: no bilateral Ear pressure: no bilateral Eyes red/itching:no Eye drainage/crusting: no  Vomiting: no Rash: no Fatigue: yes Sick contacts: no Strep contacts: no  Context:stable Recurrent sinusitis: no Relief with OTC cold/cough medications: no  Treatments attempted: none    Relevant past  medical, surgical, family and social history reviewed and updated as indicated. Interim medical history since our last visit reviewed. Allergies and medications reviewed and updated.  Review of Systems  Constitutional:  Positive for fatigue. Negative for fever.  HENT:  Positive for congestion, postnasal drip, rhinorrhea, sinus pressure, sinus pain, sneezing and sore throat. Negative for dental problem and ear pain.   Respiratory:  Negative for cough, shortness of breath and wheezing.   Cardiovascular:  Negative for chest pain.  Gastrointestinal:  Negative for vomiting.  Skin:  Negative for rash.  Neurological:  Negative for headaches.    Per HPI unless specifically indicated above     Objective:    BP 118/71 (BP Location: Left Arm, Patient Position: Sitting, Cuff Size: Normal)   Pulse 92   Temp 98.2 F (36.8 C) (Oral)   Ht 5\' 9"  (1.753 m)   Wt 151 lb (68.5 kg)   SpO2 97%   BMI 22.30 kg/m   Wt Readings from Last 3 Encounters:  12/17/22 151 lb (68.5 kg)  11/05/22 147 lb 12.8 oz (67 kg)  10/14/22 150 lb (68 kg)    Physical Exam Vitals and nursing note reviewed.  Constitutional:      General: She  is not in acute distress.    Appearance: Normal appearance. She is normal weight. She is not ill-appearing, toxic-appearing or diaphoretic.  HENT:     Head: Normocephalic.     Right Ear: External ear normal.     Left Ear: External ear normal.     Nose: Congestion and rhinorrhea present.     Right Sinus: Frontal sinus tenderness present.     Left Sinus: Frontal sinus tenderness present.     Mouth/Throat:     Mouth: Mucous membranes are moist.     Pharynx: Oropharynx is clear. Posterior oropharyngeal erythema present. No oropharyngeal exudate.  Eyes:     General:        Right eye: No discharge.        Left eye: No discharge.     Extraocular Movements: Extraocular movements intact.     Conjunctiva/sclera: Conjunctivae normal.     Pupils: Pupils are equal, round, and reactive to  light.  Cardiovascular:     Rate and Rhythm: Normal rate and regular rhythm.     Heart sounds: No murmur heard. Pulmonary:     Effort: Pulmonary effort is normal. No respiratory distress.     Breath sounds: Normal breath sounds. No wheezing or rales.  Musculoskeletal:     Cervical back: Normal range of motion and neck supple.  Skin:    General: Skin is warm and dry.     Capillary Refill: Capillary refill takes less than 2 seconds.  Neurological:     General: No focal deficit present.     Mental Status: She is alert and oriented to person, place, and time. Mental status is at baseline.  Psychiatric:        Mood and Affect: Mood normal.        Behavior: Behavior normal.        Thought Content: Thought content normal.        Judgment: Judgment normal.     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  Controlled.  Continue with current medication regimen of Zoloft 50mg  and Wellbutrin.  Refills sent today.    Return to clinic in 3 months for reevaluation.  Call sooner if concerns arise.        Relevant Medications   sertraline (ZOLOFT) 50 MG tablet   Other Visit Diagnoses     Acute non-recurrent frontal sinusitis       Will treat with amoxicillin and Medrol dose pak. Complete course of antibiotics.  Follow up if symptoms not improved.   Relevant Medications    amoxicillin (AMOXIL) 500 MG capsule   methylPREDNISolone (MEDROL DOSEPAK) 4 MG TBPK tablet  Follow up plan: Return in about 3 months (around 03/19/2023) for HTN, HLD, DM2 FU.

## 2022-12-30 ENCOUNTER — Other Ambulatory Visit: Payer: Self-pay | Admitting: Physician Assistant

## 2022-12-30 DIAGNOSIS — G4700 Insomnia, unspecified: Secondary | ICD-10-CM

## 2022-12-31 NOTE — Telephone Encounter (Signed)
Requested medication (s) are due for refill today: yes  Requested medication (s) are on the active medication list: yes  Last refill:  12/03/22 #30  Future visit scheduled: yes  Notes to clinic:  med not assigned to a protocol   Requested Prescriptions  Pending Prescriptions Disp Refills   BELSOMRA 20 MG TABS [Pharmacy Med Name: BELSOMRA 20 MG TABLET] 30 tablet 0    Sig: Take 1 tablet (20 mg total) by mouth at bedtime as needed.     Off-Protocol Failed - 12/30/2022  8:45 AM      Failed - Medication not assigned to a protocol, review manually.      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

## 2023-01-10 ENCOUNTER — Other Ambulatory Visit: Payer: Self-pay | Admitting: Nurse Practitioner

## 2023-01-13 NOTE — Telephone Encounter (Signed)
Requested Prescriptions  Pending Prescriptions Disp Refills   metFORMIN (GLUCOPHAGE) 500 MG tablet [Pharmacy Med Name: METFORMIN HCL 500 MG TABLET] 180 tablet 0    Sig: Take 2 tablets (1,000 mg total) by mouth 2 (two) times daily with a meal.     Endocrinology:  Diabetes - Biguanides Failed - 01/13/2023  7:44 AM      Failed - HBA1C is between 0 and 7.9 and within 180 days    Hgb A1c MFr Bld  Date Value Ref Range Status  07/09/2022 7.5 (H) 4.8 - 5.6 % Final    Comment:             Prediabetes: 5.7 - 6.4          Diabetes: >6.4          Glycemic control for adults with diabetes: <7.0          Failed - B12 Level in normal range and within 720 days    Vitamin B-12  Date Value Ref Range Status  08/19/2022 >2000 (H) 232 - 1245 pg/mL Final         Passed - Cr in normal range and within 360 days    Creatinine, Ser  Date Value Ref Range Status  11/05/2022 0.71 0.57 - 1.00 mg/dL Final         Passed - eGFR in normal range and within 360 days    GFR calc Af Amer  Date Value Ref Range Status  12/10/2019 83 >59 mL/min/1.73 Final    Comment:    **In accordance with recommendations from the NKF-ASN Task force,**   [facility] is in the process of updating its eGFR calculation to the   2021 CKD-EPI creatinine equation that estimates kidney function   without a race variable.    GFR, Estimated  Date Value Ref Range Status  10/14/2022 56 (L) >60 mL/min Final    Comment:    (NOTE) Calculated using the CKD-EPI Creatinine Equation (2021)    eGFR  Date Value Ref Range Status  11/05/2022 91 >59 mL/min/1.73 Final         Passed - Valid encounter within last 6 months    Recent Outpatient Visits           3 weeks ago Moderate episode of recurrent major depressive disorder [facility])   [place] [facility] Larae Grooms, NP   2 months ago Moderate episode of recurrent major depressive disorder [facility])   [place] [facility] Jackolyn Confer, MD   4 months  ago Coarse tremors   [place] [facility] Mecum, Erin E, PA-C   6 months ago 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[facility])   [place] [facility] Larae Grooms, NP   8 months ago Anxiety   [place] [facility] Larae Grooms, NP       Future Appointments             In 2 months Larae Grooms, NP [facility]            Passed - CBC within normal limits and completed in the last 12 months    WBC  Date Value Ref Range Status  10/14/2022 8.9 4.0 - 10.5 K/uL Final   RBC  Date Value Ref Range Status  10/14/2022 5.18 (H) 3.87 - 5.11 MIL/uL Final   Hemoglobin  Date Value Ref Range Status  10/14/2022 14.9 12.0 - 15.0 g/dL Final  08/19/2022 14.4 11.1 - 15.9 g/dL Final   HCT  Date Value Ref Range Status  10/14/2022 44.3 36.0 - 46.0 % Final   Hematocrit  Date Value Ref Range Status  08/19/2022 44.1 34.0 - 46.6 % Final   MCHC  Date Value Ref Range Status  10/14/2022 33.6 30.0 - 36.0 g/dL Final   [facility]  Date Value Ref Range Status  10/14/2022 28.8 26.0 - 34.0 pg Final   MCV  Date Value Ref Range Status  10/14/2022 85.5 80.0 - 100.0 fL Final  08/19/2022 88 79 - 97 fL Final   No results found for: "PLTCOUNTKUC", "LABPLAT", "POCPLA" RDW  Date Value Ref Range Status  10/14/2022 13.6 11.5 - 15.5 % Final  08/19/2022 13.4 11.7 - 15.4 % Final

## 2023-02-17 ENCOUNTER — Other Ambulatory Visit: Payer: Self-pay | Admitting: Nurse Practitioner

## 2023-02-19 NOTE — Telephone Encounter (Signed)
Requested Prescriptions  Pending Prescriptions Disp Refills   hydrOXYzine (VISTARIL) 25 MG capsule [Pharmacy Med Name: HYDROXYZINE PAM 25 MG CAP] 270 capsule 0    Sig: TAKE 1 CAPSULE BY MOUTH THREE TIMES DAILY AS NEEDED FOR ANXIETY     Ear, Nose, and Throat:  Antihistamines 2 Passed - 02/19/2023 11:29 AM      Passed - Cr in normal range and within 360 days    Creatinine, Ser  Date Value Ref Range Status  11/05/2022 0.71 0.57 - 1.00 mg/dL Final         Passed - Valid encounter within last 12 months    Recent Outpatient Visits           2 months ago Moderate episode of recurrent major depressive disorder [facility])   [place] [facility] Larae Grooms, NP   3 months ago Moderate episode of recurrent major depressive disorder [facility])   [place] [facility] Jackolyn Confer, MD   6 months ago Coarse tremors   [place] [facility] Mecum, Erin E, PA-C   7 months ago 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

## 2023-02-20 LAB — ORGANISM ID, BACTERIA

## 2023-02-27 ENCOUNTER — Other Ambulatory Visit: Payer: Self-pay | Admitting: Nurse Practitioner

## 2023-02-28 NOTE — Telephone Encounter (Signed)
 Requested Prescriptions  Pending Prescriptions Disp Refills   metFORMIN  (GLUCOPHAGE ) 500 MG tablet [Pharmacy Med Name: METFORMIN  HCL 500 MG TABLET] 360 tablet 0    Sig: Take 2 tablets (1,000 mg total) by mouth 2 (two) times daily with a meal.     Endocrinology:  Diabetes - Biguanides Failed - 02/28/2023  9:40 AM      Failed - HBA1C is between 0 and 7.9 and within 180 days    Hgb A1c MFr Bld  Date Value Ref Range Status  07/09/2022 7.5 (H) 4.8 - 5.6 % Final    Comment:             Prediabetes: 5.7 - 6.4          Diabetes: >6.4          Glycemic control for adults with diabetes: <7.0          Failed - B12 Level in normal range and within 720 days    Vitamin B-12  Date Value Ref Range Status  08/19/2022 >2000 (H) 232 - 1245 pg/mL Final         Passed - Cr in normal range and within 360 days    Creatinine, Ser  Date Value Ref Range Status  11/05/2022 0.71 0.57 - 1.00 mg/dL Final         Passed - eGFR in normal range and within 360 days    GFR calc Af Amer  Date Value Ref Range Status  12/10/2019 83 >59 mL/min/1.73 Final    Comment:    **In accordance with recommendations from the NKF-ASN Task force,**   [facility] is in the process of updating its eGFR calculation to the   2021 CKD-EPI creatinine equation that estimates kidney function   without a race variable.    GFR, Estimated  Date Value Ref Range Status  10/14/2022 56 (L) >60 mL/min Final    Comment:    (NOTE) Calculated using the CKD-EPI Creatinine Equation (2021)    eGFR  Date Value Ref Range Status  11/05/2022 91 >59 mL/min/1.73 Final         Passed - Valid encounter within last 6 months    Recent Outpatient Visits           2 months ago Moderate episode of recurrent major depressive disorder [facility])   [place] [facility] Melvin Pao, NP   3 months ago Moderate episode of recurrent major depressive disorder [facility])   [place] [facility] Herold Hadassah SQUIBB, MD   6 months ago  Coarse tremors   [place] [facility] Mecum, Erin E, PA-C   7 months ago 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[facility])   [place] [facility] Melvin Pao, NP   9 months ago Anxiety   [facility] Melvin Pao, NP       Future Appointments             In 3 weeks Melvin Pao, NP  [facility]            Passed - CBC within normal limits and completed in the last 12 months    WBC  Date Value Ref Range Status  10/14/2022 8.9 4.0 - 10.5 K/uL Final   RBC  Date Value Ref Range Status  10/14/2022 5.18 (H) 3.87 - 5.11 MIL/uL Final   Hemoglobin  Date Value Ref Range Status  10/14/2022 14.9 12.0 - 15.0 g/dL Final  08/19/2022 14.4 11.1 - 15.9 g/dL Final   HCT  Date Value Ref Range Status  10/14/2022 44.3 36.0 - 46.0 % Final   Hematocrit  Date Value Ref Range Status  08/19/2022 44.1 34.0 - 46.6 % Final   MCHC  Date Value Ref Range Status  10/14/2022 33.6 30.0 - 36.0 g/dL Final   [facility]  Date Value Ref Range Status  10/14/2022 28.8 26.0 - 34.0 pg Final   MCV  Date Value Ref Range Status  10/14/2022 85.5 80.0 - 100.0 fL Final  08/19/2022 88 79 - 97 fL Final   No results found for: PLTCOUNTKUC, LABPLAT, POCPLA RDW  Date Value Ref Range Status  10/14/2022 13.6 11.5 - 15.5 % Final  08/19/2022 13.4 11.7 - 15.4 % Final

## 2023-03-03 ENCOUNTER — Other Ambulatory Visit: Payer: Self-pay | Admitting: Nurse Practitioner

## 2023-03-04 NOTE — Telephone Encounter (Signed)
 Requested Prescriptions  Refused Prescriptions Disp Refills   metFORMIN (GLUCOPHAGE) 500 MG tablet [Pharmacy Med Name: METFORMIN HCL 500 MG TABLET] 360 tablet 0    Sig: Take 2 tablets (1,000 mg total) by mouth 2 (two) times daily with a meal.     Endocrinology:  Diabetes - Biguanides Failed - 03/04/2023 11:22 AM      Failed - HBA1C is between 0 and 7.9 and within 180 days    Hgb A1c MFr Bld  Date Value Ref Range Status  07/09/2022 7.5 (H) 4.8 - 5.6 % Final    Comment:             Prediabetes: 5.7 - 6.4          Diabetes: >6.4          Glycemic control for adults with diabetes: <7.0          Failed - B12 Level in normal range and within 720 days    Vitamin B-12  Date Value Ref Range Status  08/19/2022 >2000 (H) 232 - 1245 pg/mL Final         Passed - Cr in normal range and within 360 days    Creatinine, Ser  Date Value Ref Range Status  11/05/2022 0.71 0.57 - 1.00 mg/dL Final         Passed - eGFR in normal range and within 360 days    GFR calc Af Amer  Date Value Ref Range Status  12/10/2019 83 >59 mL/min/1.73 Final    Comment:    **In accordance with recommendations from the NKF-ASN Task force,**   [facility] is in the process of updating its eGFR calculation to the   2021 CKD-EPI creatinine equation that estimates kidney function   without a race variable.    GFR, Estimated  Date Value Ref Range Status  10/14/2022 56 (L) >60 mL/min Final    Comment:    (NOTE) Calculated using the CKD-EPI Creatinine Equation (2021)    eGFR  Date Value Ref Range Status  11/05/2022 91 >59 mL/min/1.73 Final         Passed - Valid encounter within last 6 months    Recent Outpatient Visits           2 months ago Moderate episode of recurrent major depressive disorder [facility])   [place] [facility] Larae Grooms, NP   3 months ago Moderate episode of recurrent major depressive disorder [facility])   [place] [facility] Jackolyn Confer, MD   6 months  ago Coarse tremors   [place] [facility] Mecum, Erin E, PA-C   7 months ago 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[facility])   [place] [facility] Larae Grooms, NP   9 months ago Anxiety   [place] [facility] Larae Grooms, NP       Future Appointments             In 2 weeks Larae Grooms, NP [place] [facility]            Passed - CBC within normal limits and completed in the last 12 months    WBC  Date Value Ref Range Status  10/14/2022 8.9 4.0 - 10.5 K/uL Final   RBC  Date Value Ref Range Status  10/14/2022 5.18 (H) 3.87 - 5.11 MIL/uL Final   Hemoglobin  Date Value Ref Range Status  10/14/2022 14.9 12.0 - 15.0 g/dL Final  74/25/9563  14.4 11.1 - 15.9 g/dL Final   HCT  Date Value Ref Range Status  10/14/2022 44.3 36.0 - 46.0 % Final   Hematocrit  Date Value Ref Range Status  08/19/2022 44.1 34.0 - 46.6 % Final   MCHC  Date Value Ref Range Status  10/14/2022 33.6 30.0 - 36.0 g/dL Final   [facility]  Date Value Ref Range Status  10/14/2022 28.8 26.0 - 34.0 pg Final   MCV  Date Value Ref Range Status  10/14/2022 85.5 80.0 - 100.0 fL Final  08/19/2022 88 79 - 97 fL Final   No results found for: "PLTCOUNTKUC", "LABPLAT", "POCPLA" RDW  Date Value Ref Range Status  10/14/2022 13.6 11.5 - 15.5 % Final  08/19/2022 13.4 11.7 - 15.4 % Final

## 2023-03-24 ENCOUNTER — Other Ambulatory Visit: Payer: Self-pay | Admitting: Physician Assistant

## 2023-03-24 ENCOUNTER — Telehealth: Payer: Self-pay

## 2023-03-24 ENCOUNTER — Ambulatory Visit: Payer: 59 | Admitting: Nurse Practitioner

## 2023-03-24 DIAGNOSIS — G4700 Insomnia, unspecified: Secondary | ICD-10-CM

## 2023-03-24 NOTE — Progress Notes (Deleted)
 There were no vitals taken for this visit.   Subjective:    Patient ID: Marcia [name], female    DOB: 1950-09-24, 73 y.o.   MRN: [id number]  HPI: Marcia [name] is a 73 y.o. female presenting on 03/24/2023 for comprehensive medical examination. Current medical complaints include:{Blank single:19197::"none","***"}  She currently lives with: Menopausal Symptoms: {Blank single:19197::"yes","no"}  HYPERTENSION / HYPERLIPIDEMIA Satisfied with current treatment? yes Duration of hypertension: years BP monitoring frequency: daily BP range: 135/80s BP medication side effects: no Past BP meds: clonidine and HCTZ Duration of hyperlipidemia: years Cholesterol medication side effects: no Cholesterol supplements: none Past cholesterol medications: pravastatin (pravachol) Medication compliance: excellent compliance Aspirin: no Recent stressors: no Recurrent headaches: no Visual changes: no Palpitations: no Dyspnea: no Chest pain: no Lower extremity edema: no Dizzy/lightheaded: no  DIABETES Hypoglycemic episodes:no Polydipsia/polyuria: no Visual disturbance: no Chest pain: no Paresthesias: no Glucose Monitoring: yes  Accucheck frequency: Daily  Fasting glucose:130  Post prandial:  Evening:  Before meals: Taking Insulin?: no  Long acting insulin:  Short acting insulin: Blood Pressure Monitoring: daily Retinal Examination: Up to Date Foot Exam: Up to Date Diabetic Education: Not Completed Pneumovax: Up to Date Influenza: Not up to Date Aspirin: no  ANXIETY Feels like the Zoloft is working well for her.  She is taking 50mg  which she feels like is working well for her.  Welbutrin working well but wants another medication More tearful than usual No SI/HI Declines psychotherapy  Depression Screen done today and results listed below:     12/17/2022    3:00 PM 11/05/2022    4:18 PM 08/19/2022    3:40 PM 07/09/2022    3:31 PM 05/14/2022    3:10 PM  Depression screen PHQ  2/9  Decreased Interest 0 2 2 0 0  Down, Depressed, Hopeless 2 2 0 0 0  PHQ - 2 Score 2 4 2  0 0  Altered sleeping 0 0 3 0 0  Tired, decreased energy 0 3 3 0 0  Change in appetite 0 0 0 2 0  Feeling bad or failure about yourself  0 0 0 0 0  Trouble concentrating 0 0 0 0 0  Moving slowly or fidgety/restless 0 0 0 0 0  Suicidal thoughts 0 0 0 0 0  PHQ-9 Score 2 7 8 2  0  Difficult doing work/chores  Not difficult at all Not difficult at all  Not difficult at all    The patient {has/does not have:19849} a history of falls. I {did/did not:19850} complete a risk assessment for falls. A plan of care for falls {was/was not:19852} documented.   Past Medical History:  Past Medical History:  Diagnosis Date   Anxiety    Colon cancer screening    Depression    Hyperlipidemia    Hypertension    Perforated bowel (HCC) 12/06/2017    Surgical History:  Past Surgical History:  Procedure Laterality Date   BREAST BIOPSY Left 2015   CORE W/CLIP - NEG   BREAST BIOPSY Left 07/2007   neg bx    CESAREAN SECTION     COLON SURGERY     COLONOSCOPY WITH PROPOFOL N/A 02/18/2018   Procedure: COLONOSCOPY WITH PROPOFOL;  Surgeon: Wyline Mood, MD;  Location: [facility] ENDOSCOPY;  Service: Gastroenterology;  Laterality: N/A;   COLONOSCOPY WITH PROPOFOL N/A 03/25/2018   Procedure: COLONOSCOPY WITH PROPOFOL;  Surgeon: Wyline Mood, MD;  Location: [facility] ENDOSCOPY;  Service: Gastroenterology;  Laterality: N/A;   COLONOSCOPY WITH PROPOFOL N/A 08/25/2018  Procedure: COLONOSCOPY WITH PROPOFOL;  Surgeon: Toney Reil, MD;  Location: [facility] ENDOSCOPY;  Service: Gastroenterology;  Laterality: N/A;   LAPAROTOMY N/A 12/06/2017   Procedure: EXPLORATORY LAPAROTOMY, sigmoid colectomy, anastomosis;  Surgeon: Ancil Linsey, MD;  Location: [facility] ORS;  Service: General;  Laterality: N/A;   TUBAL LIGATION     XI ROBOTIC ASSISTED VENTRAL HERNIA N/A 11/26/2018   Procedure: XI ROBOTIC ASSISTED VENTRAL HERNIA;  Surgeon: Leafy Ro, MD;  Location: [facility] ORS;  Service: General;  Laterality: N/A;    Medications:  Current Outpatient Medications on File Prior to Visit  Medication Sig   Blood Pressure Monitoring (BLOOD PRESSURE MONITOR AUTOMAT) DEVI 1 Device by Does not apply route in the morning and at bedtime. Check blood pressure with device twice per day per manufacturer's instructions   buPROPion (WELLBUTRIN XL) 150 MG 24 hr tablet Take 1 tablet (150 mg total) by mouth daily.   colchicine 0.6 MG tablet Take 1 tablet (0.6 mg total) by mouth daily.   fluticasone (FLONASE) 50 MCG/ACT nasal spray Place 2 sprays into both nostrils daily.   gabapentin (NEURONTIN) 100 MG capsule Take 1 capsule (100 mg total) by mouth daily.   gabapentin (NEURONTIN) 300 MG capsule Take 1 capsule (300 mg total) by mouth 2 (two) times daily.   hydrOXYzine (VISTARIL) 25 MG capsule TAKE 1 CAPSULE BY MOUTH THREE TIMES DAILY AS NEEDED FOR ANXIETY   hyoscyamine (LEVSIN) 0.125 MG tablet Take 0.125 mg by mouth every 6 (six) hours as needed.   metFORMIN (GLUCOPHAGE) 500 MG tablet Take 2 tablets (1,000 mg total) by mouth 2 (two) times daily with a meal.   methylPREDNISolone (MEDROL DOSEPAK) 4 MG TBPK tablet Take as directed   omeprazole (PRILOSEC) 40 MG capsule Take 40 mg by mouth daily.   ondansetron (ZOFRAN) 4 MG tablet Take 1 tablet (4 mg total) by mouth every 8 (eight) hours as needed for nausea or vomiting.   ondansetron (ZOFRAN-ODT) 4 MG disintegrating tablet Take 1 tablet (4 mg total) by mouth every 8 (eight) hours as needed for nausea or vomiting.   pravastatin (PRAVACHOL) 40 MG tablet Take 1 tablet (40 mg total) by mouth daily.   sertraline (ZOLOFT) 50 MG tablet Take 1 tablet (50 mg total) by mouth daily. Take 1/2 the first 4 days.   Suvorexant (BELSOMRA) 20 MG TABS Take 1 tablet (20 mg total) by mouth at bedtime as needed.   tiZANidine (ZANAFLEX) 4 MG tablet Take 1 tablet (4 mg total) by mouth at bedtime as needed for muscle spasms.    triamcinolone cream (KENALOG) 0.1 % Apply 1 Application topically 2 (two) times daily.   No current facility-administered medications on file prior to visit.    Allergies:  No Known Allergies  Social History:  Social History   Socioeconomic History   Marital status: Divorced    Spouse name: Not on file   Number of children: Not on file   Years of education: Not on file   Highest education level: High school graduate  Occupational History   Not on file  Tobacco Use   Smoking status: Some Days    Current packs/day: 0.00    Types: Cigarettes    Last attempt to quit: 11/18/2012    Years since quitting: 10.3   Smokeless tobacco: Never   Tobacco comments:    quit over 15 years ago   Vaping Use   Vaping status: Some Days  Substance and Sexual Activity   Alcohol use: No  Drug use: No   Sexual activity: Not Currently  Other Topics Concern   Not on file  Social History Narrative   Goes out to eat with sisters once a month   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    Goes to walking twice a week for 30 minutes    Social Drivers of Corporate investment banker Strain: Low Risk  (10/05/2020)   Overall Financial Resource Strain (CARDIA)    Difficulty of Paying Living Expenses: Not hard at all  Food Insecurity: No Food Insecurity (10/05/2020)   Hunger Vital Sign    Worried About Running Out of Food in the Last Year: Never true    Ran Out of Food in the Last Year: Never true  Transportation Needs: No Transportation Needs (10/05/2020)   PRAPARE - Administrator, Civil Service (Medical): No    Lack of Transportation (Non-Medical): No  Physical Activity: Insufficiently Active (10/05/2020)   Exercise Vital Sign    Days of Exercise per Week: 7 days    Minutes of Exercise per Session: 10 min  Stress: No Stress Concern Present (10/05/2020)   [employer] of Occupational Health - Occupational Stress Questionnaire    Feeling of Stress : Not at all  Social Connections: Moderately  Integrated (10/05/2020)   Social Connection and Isolation Panel [NHANES]    Frequency of Communication with Friends and Family: More than three times a week    Frequency of Social Gatherings with Friends and Family: More than three times a week    Attends Religious Services: More than 4 times per year    Active Member of [employer] or Organizations: Yes    Attends Engineer, structural: More than 4 times per year    Marital Status: Divorced  Intimate Partner Violence: Not At Risk (10/05/2020)   Humiliation, Afraid, Rape, and Kick questionnaire    Fear of Current or Ex-Partner: No    Emotionally Abused: No    Physically Abused: No    Sexually Abused: No   Social History   Tobacco Use  Smoking Status Some Days   Current packs/day: 0.00   Types: Cigarettes   Last attempt to quit: 11/18/2012   Years since quitting: 10.3  Smokeless Tobacco Never  Tobacco Comments   quit over 15 years ago    Social History   Substance and Sexual Activity  Alcohol Use No    Family History:  Family History  Problem Relation Age of Onset   Breast cancer Mother    Liver cancer Sister    Pancreatic cancer Sister    Stomach cancer Brother    Lung cancer Brother    Brain cancer Sister    Diabetes Father    Heart disease Father    Breast cancer Sister    Heart attack Brother    COPD Neg Hx    Stroke Neg Hx     Past medical history, surgical history, medications, allergies, family history and social history reviewed with patient today and changes made to appropriate areas of the chart.   ROS All other ROS negative except what is listed above and in the HPI.      Objective:    There were no vitals taken for this visit.  Wt Readings from Last 3 Encounters:  12/17/22 151 lb (68.5 kg)  11/05/22 147 lb 12.8 oz (67 kg)  10/14/22 150 lb (68 kg)    Physical Exam  Results for orders placed or performed in visit on  11/05/22  Basic Metabolic Panel (BMET)   Collection Time: 11/05/22  4:29 PM   Result Value Ref Range   Glucose 111 (H) 70 - 99 mg/dL   BUN 10 8 - 27 mg/dL   Creatinine, Ser 0.98 0.57 - 1.00 mg/dL   eGFR 91 >11 BJ/YNW/2.95   BUN/Creatinine Ratio 14 12 - 28   Sodium 146 (H) 134 - 144 mmol/L   Potassium 3.9 3.5 - 5.2 mmol/L   Chloride 106 96 - 106 mmol/L   CO2 25 20 - 29 mmol/L   Calcium 9.6 8.7 - 10.3 mg/dL  Magnesium   Collection Time: 11/05/22  4:29 PM  Result Value Ref Range   Magnesium 1.8 1.6 - 2.3 mg/dL      Assessment & Plan:   Problem List Items Addressed This Visit       Cardiovascular and Mediastinum   Hypertension associated with diabetes (HCC) - Primary   Aortic atherosclerosis (HCC)   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(HCC)     Endocrine   Hyperlipidemia associated with type 2 diabetes mellitus (HCC)     Other   Moderate episode of recurrent major depressive disorder (HCC)     Follow up plan: No follow-ups on file.   LABORATORY TESTING:  - Pap smear: {Blank single:19197::"pap done","not applicable","up to date","done elsewhere"}  IMMUNIZATIONS:   - Tdap: Tetanus vaccination status reviewed: {tetanus status:315746}. - Influenza: {Blank single:19197::"Up to date","Administered today","Postponed to flu season","Refused","Given elsewhere"} - Pneumovax: {Blank single:19197::"Up to date","Administered today","Not applicable","Refused","Given elsewhere"} - Prevnar: {Blank single:19197::"Up to date","Administered today","Not applicable","Refused","Given elsewhere"} - COVID: {Blank single:19197::"Up to date","Administered today","Not applicable","Refused","Given elsewhere"} - HPV: {Blank single:19197::"Up to date","Administered today","Not applicable","Refused","Given elsewhere"} - Shingrix vaccine: {Blank single:19197::"Up to date","Administered today","Not applicable","Refused","Given elsewhere"}  SCREENING: -Mammogram: {Blank single:19197::"Up to date","Ordered today","Not applicable","Refused","Done  elsewhere"}  - Colonoscopy: {Blank single:19197::"Up to date","Ordered today","Not applicable","Refused","Done elsewhere"}  - Bone Density: {Blank single:19197::"Up to date","Ordered today","Not applicable","Refused","Done elsewhere"}  -Hearing Test: {Blank single:19197::"Up to date","Ordered today","Not applicable","Refused","Done elsewhere"}  -Spirometry: {Blank single:19197::"Up to date","Ordered today","Not applicable","Refused","Done elsewhere"}   PATIENT COUNSELING:   Advised to take 1 mg of folate supplement per day if capable of pregnancy.   Sexuality: Discussed sexually transmitted diseases, partner selection, use of condoms, avoidance of unintended pregnancy  and contraceptive alternatives.   Advised to avoid cigarette smoking.  I discussed with the patient that most people either abstain from alcohol or drink within safe limits (<=14/week and <=4 drinks/occasion for males, <=7/weeks and <= 3 drinks/occasion for females) and that the risk for alcohol disorders and other health effects rises proportionally with the number of drinks per week and how often a drinker exceeds daily limits.  Discussed cessation/primary prevention of drug use and availability of treatment for abuse.   Diet: Encouraged to adjust caloric intake to maintain  or achieve ideal body weight, to reduce intake of dietary saturated fat and total fat, to limit sodium intake by avoiding high sodium foods and not adding table salt, and to maintain adequate dietary potassium and calcium preferably from fresh fruits, vegetables, and low-fat dairy products.    stressed the importance of regular exercise  Injury prevention: Discussed safety belts, safety helmets, smoke detector, smoking near bedding or upholstery.   Dental health: Discussed importance of regular tooth brushing, flossing, and dental visits.    NEXT PREVENTATIVE PHYSICAL DUE IN 1 YEAR. No follow-ups on file.

## 2023-03-24 NOTE — Telephone Encounter (Signed)
 Copied from CRM [id number]. Topic: Clinical - Prescription Issue >> Mar 24, 2023  3:56 PM Geroge Baseman wrote: Reason for CRM: Suvorexant (BELSOMRA) 20 MG TABS, patient is calling in because she said the pharmacy does not have her refill.

## 2023-03-24 NOTE — Telephone Encounter (Signed)
Refill request forwarded to provider

## 2023-03-24 NOTE — Telephone Encounter (Signed)
 Requested medication (s) are due for refill today: Yes  Requested medication (s) are on the active medication list: Yes  Last refill:  01/01/23  Future visit scheduled: Yes  Notes to clinic:  Unable to refill due to no refill protocol for this medication.      Requested Prescriptions  Pending Prescriptions Disp Refills   BELSOMRA 20 MG TABS [Pharmacy Med Name: BELSOMRA 20 MG TABLET] 30 tablet 0    Sig: Take 1 tablet (20 mg total) by mouth at bedtime as needed.     Off-Protocol Failed - 03/24/2023  4:41 PM      Failed - Medication not assigned to a protocol, review manually.      Passed - Valid encounter within last 12 months    Recent Outpatient Visits           3 months ago Moderate episode of recurrent major depressive disorder [facility])   [facility] Larae Grooms, NP   4 months ago Moderate episode of recurrent major depressive disorder [facility])   [place] [facility] Jackolyn Confer, MD   7 months ago Coarse tremors   [place] [facility] Mecum, Oswaldo Conroy, PA-C   8 months ago 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[facility])   [place] [facility] Larae Grooms, NP   10 months ago Anxiety   [facility] Larae Grooms, NP       Future Appointments             In 2 weeks Larae Grooms, NP [facility]

## 2023-03-31 ENCOUNTER — Other Ambulatory Visit: Payer: Self-pay | Admitting: Nurse Practitioner

## 2023-03-31 DIAGNOSIS — Z1231 Encounter for screening mammogram for malignant neoplasm of breast: Secondary | ICD-10-CM

## 2023-04-07 ENCOUNTER — Ambulatory Visit (INDEPENDENT_AMBULATORY_CARE_PROVIDER_SITE_OTHER): Admitting: Nurse Practitioner

## 2023-04-07 ENCOUNTER — Encounter: Payer: Self-pay | Admitting: Nurse Practitioner

## 2023-04-07 VITALS — BP 139/82 | HR 99 | Temp 98.3°F | Wt 149.6 lb

## 2023-04-07 DIAGNOSIS — E1159 Type 2 diabetes mellitus with other circulatory complications: Secondary | ICD-10-CM | POA: Diagnosis not present

## 2023-04-07 DIAGNOSIS — E785 Hyperlipidemia, unspecified: Secondary | ICD-10-CM

## 2023-04-07 DIAGNOSIS — E1169 Type 2 diabetes mellitus with other specified complication: Secondary | ICD-10-CM

## 2023-04-07 DIAGNOSIS — Z7984 Long term (current) use of oral hypoglycemic drugs: Secondary | ICD-10-CM

## 2023-04-07 DIAGNOSIS — F331 Major depressive disorder, recurrent, moderate: Secondary | ICD-10-CM

## 2023-04-07 DIAGNOSIS — Z0000 Encounter for general adult medical examination without abnormal findings: Secondary | ICD-10-CM | POA: Diagnosis not present

## 2023-04-07 DIAGNOSIS — I700 Atherosclerosis of aorta: Secondary | ICD-10-CM | POA: Diagnosis not present

## 2023-04-07 DIAGNOSIS — I152 Hypertension secondary to endocrine disorders: Secondary | ICD-10-CM

## 2023-04-07 DIAGNOSIS — F419 Anxiety disorder, unspecified: Secondary | ICD-10-CM

## 2023-04-07 LAB — URINALYSIS, ROUTINE W REFLEX MICROSCOPIC
Bilirubin, UA: NEGATIVE
Glucose, UA: NEGATIVE
Ketones, UA: NEGATIVE
Nitrite, UA: NEGATIVE
Protein,UA: NEGATIVE
RBC, UA: NEGATIVE
Specific Gravity, UA: >1.03 — ABNORMAL HIGH (ref 1.005–1.030)
Urobilinogen, Ur: 0.2 mg/dL (ref 0.2–1.0)
pH, UA: 5.5 (ref 5.0–7.5)

## 2023-04-07 LAB — MICROALBUMIN, URINE WAIVED
Creatinine, Urine Waived: 200 mg/dL (ref 10–300)
Microalb, Ur Waived: 80 mg/L — ABNORMAL HIGH (ref 0–19)
Microalb/Creat Ratio: <30 mg/g (ref <30)

## 2023-04-07 LAB — MICROSCOPIC EXAMINATION
Epithelial Cells (non renal): NONE SEEN /HPF (ref 0–10)
RBC, Urine: NONE SEEN /HPF (ref 0–2)
WBC, UA: NONE SEEN /HPF (ref 0–5)

## 2023-04-07 MED ORDER — SERTRALINE HCL 100 MG PO TABS
100.0000 mg | ORAL_TABLET | Freq: Every day | ORAL | 1 refills | Status: DC
Start: 2023-04-07 — End: 2023-10-20

## 2023-04-07 MED ORDER — RIZATRIPTAN BENZOATE 10 MG PO TABS: 10.0000 mg | ORAL_TABLET | ORAL | 0 refills | Status: DC | PRN

## 2023-04-07 NOTE — Assessment & Plan Note (Signed)
 Chronic. Not well controlled.  Zoloft increased to 100mg  daily.  Follow up in 1 month.  Call sooner if concerns arise.

## 2023-04-07 NOTE — Assessment & Plan Note (Signed)
 Chronic.  Controlled.   Continue with current medication regimen of Metformin 1000mg  BID.  Labs ordered today. Will make further recommendations based on lab results.  Return to clinic in 3 months for reevaluation.  Call sooner if concerns arise.

## 2023-04-07 NOTE — Assessment & Plan Note (Signed)
 Chronic.  Controlled.  Continue with current medication regimen on Pravastatin 40mg .  Refills sent today.  Labs ordered today.  Return to clinic in 3 months for reevaluation.  Call sooner if concerns arise.

## 2023-04-07 NOTE — Assessment & Plan Note (Signed)
 Chronic.  Controlled without medication.  Refills sent today.  Labs ordered today. Will make further recommendations based on lab results. Continue to check blood pressures at home. If consistently elevated >140 call the office.  Return to clinic in 3 months for reevaluation.  Call sooner if concerns arise.

## 2023-04-07 NOTE — Progress Notes (Signed)
 BP 139/82   Pulse 99   Temp 98.3 F (36.8 C) (Oral)   Wt 149 lb 9.6 oz (67.9 kg)   SpO2 94%   BMI 22.09 kg/m    Subjective:    Patient ID: Marcia [name], female    DOB: 25-Mar-1950, 73 y.o.   MRN: [id number]  HPI: Marcia [name] is a 73 y.o. female presenting on 04/07/2023 for comprehensive medical examination. Current medical complaints include: depression  She currently lives with: Menopausal Symptoms: no  HYPERTENSION / HYPERLIPIDEMIA Satisfied with current treatment? yes Duration of hypertension: years BP monitoring frequency: not checking BP range: BP medication side effects: no Past BP meds: nothing at this time Duration of hyperlipidemia: years Cholesterol medication side effects: no Cholesterol supplements: none Past cholesterol medications: pravastatin (pravachol) Medication compliance: excellent compliance Aspirin: no Recent stressors: no Recurrent headaches: yes Visual changes: no Palpitations: no Dyspnea: no Chest pain: no Lower extremity edema: no Dizzy/lightheaded: no  DIABETES Hypoglycemic episodes:no Polydipsia/polyuria: no Visual disturbance: no Chest pain: no Paresthesias: no Glucose Monitoring: yes  Accucheck frequency: Daily  Fasting glucose:130  Post prandial:  Evening:  Before meals: Taking Insulin?: no  Long acting insulin:  Short acting insulin: Blood Pressure Monitoring: daily Retinal Examination: Up to Date- Dr. Clydene Pugh April 7 Foot Exam: Up to Date Diabetic Education: Not Completed Pneumovax: Up to Date Influenza: Not up to Date Aspirin: no  Patient states she has been having migraines.  She has been having them ongoing for many years.  Recently has been worse.     ANXIETY Feels like she has had more anxiety and depression lately.  She would like to see if her Zoloft can be increased.    Welbutrin working well but wants another medication   Depression Screen done today and results listed below:     04/07/2023     3:40 PM 12/17/2022    3:00 PM 11/05/2022    4:18 PM 08/19/2022    3:40 PM 07/09/2022    3:31 PM  Depression screen PHQ 2/9  Decreased Interest 2 0 2 2 0  Down, Depressed, Hopeless 0 2 2 0 0  PHQ - 2 Score 2 2 4 2  0  Altered sleeping 2 0 0 3 0  Tired, decreased energy 0 0 3 3 0  Change in appetite 0 0 0 0 2  Feeling bad or failure about yourself  0 0 0 0 0  Trouble concentrating 0 0 0 0 0  Moving slowly or fidgety/restless 0 0 0 0 0  Suicidal thoughts 0 0 0 0 0  PHQ-9 Score 4 2 7 8 2   Difficult doing work/chores Not difficult at all  Not difficult at all Not difficult at all     The patient does not have a history of falls. I did complete a risk assessment for falls. A plan of care for falls was documented.   Past Medical History:  Past Medical History:  Diagnosis Date   Anxiety    Colon cancer screening    Depression    Hyperlipidemia    Hypertension    Perforated bowel (HCC) 12/06/2017    Surgical History:  Past Surgical History:  Procedure Laterality Date   BREAST BIOPSY Left 2015   CORE W/CLIP - NEG   BREAST BIOPSY Left 07/2007   neg bx    CESAREAN SECTION     COLON SURGERY     COLONOSCOPY WITH PROPOFOL N/A 02/18/2018   Procedure: COLONOSCOPY WITH PROPOFOL;  Surgeon: Tobi Bastos,  Sharlet Salina, MD;  Location: [facility] ENDOSCOPY;  Service: Gastroenterology;  Laterality: N/A;   COLONOSCOPY WITH PROPOFOL N/A 03/25/2018   Procedure: COLONOSCOPY WITH PROPOFOL;  Surgeon: Wyline Mood, MD;  Location: [facility] ENDOSCOPY;  Service: Gastroenterology;  Laterality: N/A;   COLONOSCOPY WITH PROPOFOL N/A 08/25/2018   Procedure: COLONOSCOPY WITH PROPOFOL;  Surgeon: Toney Reil, MD;  Location: [facility] ENDOSCOPY;  Service: Gastroenterology;  Laterality: N/A;   LAPAROTOMY N/A 12/06/2017   Procedure: EXPLORATORY LAPAROTOMY, sigmoid colectomy, anastomosis;  Surgeon: Ancil Linsey, MD;  Location: [facility] ORS;  Service: General;  Laterality: N/A;   TUBAL LIGATION     XI ROBOTIC ASSISTED VENTRAL HERNIA N/A  11/26/2018   Procedure: XI ROBOTIC ASSISTED VENTRAL HERNIA;  Surgeon: Leafy Ro, MD;  Location: [facility] ORS;  Service: General;  Laterality: N/A;    Medications:  Current Outpatient Medications on File Prior to Visit  Medication Sig   BELSOMRA 20 MG TABS Take 1 tablet (20 mg total) by mouth at bedtime as needed.   Blood Pressure Monitoring (BLOOD PRESSURE MONITOR AUTOMAT) DEVI 1 Device by Does not apply route in the morning and at bedtime. Check blood pressure with device twice per day per manufacturer's instructions   buPROPion (WELLBUTRIN XL) 150 MG 24 hr tablet Take 1 tablet (150 mg total) by mouth daily.   colchicine 0.6 MG tablet Take 1 tablet (0.6 mg total) by mouth daily.   fluticasone (FLONASE) 50 MCG/ACT nasal spray Place 2 sprays into both nostrils daily.   gabapentin (NEURONTIN) 100 MG capsule Take 1 capsule (100 mg total) by mouth daily.   gabapentin (NEURONTIN) 300 MG capsule Take 1 capsule (300 mg total) by mouth 2 (two) times daily.   hydrOXYzine (VISTARIL) 25 MG capsule TAKE 1 CAPSULE BY MOUTH THREE TIMES DAILY AS NEEDED FOR ANXIETY   hyoscyamine (LEVSIN) 0.125 MG tablet Take 0.125 mg by mouth every 6 (six) hours as needed.   metFORMIN (GLUCOPHAGE) 500 MG tablet Take 2 tablets (1,000 mg total) by mouth 2 (two) times daily with a meal.   omeprazole (PRILOSEC) 40 MG capsule Take 40 mg by mouth daily.   ondansetron (ZOFRAN-ODT) 4 MG disintegrating tablet Take 1 tablet (4 mg total) by mouth every 8 (eight) hours as needed for nausea or vomiting.   pravastatin (PRAVACHOL) 40 MG tablet Take 1 tablet (40 mg total) by mouth daily.   tiZANidine (ZANAFLEX) 4 MG tablet Take 1 tablet (4 mg total) by mouth at bedtime as needed for muscle spasms.   triamcinolone cream (KENALOG) 0.1 % Apply 1 Application topically 2 (two) times daily.   No current facility-administered medications on file prior to visit.    Allergies:  No Known Allergies  Social History:  Social History    Socioeconomic History   Marital status: Divorced    Spouse name: Not on file   Number of children: Not on file   Years of education: Not on file   Highest education level: High school graduate  Occupational History   Not on file  Tobacco Use   Smoking status: Some Days    Current packs/day: 0.00    Types: Cigarettes    Last attempt to quit: 11/18/2012    Years since quitting: 10.3   Smokeless tobacco: Never   Tobacco comments:    quit over 15 years ago   Vaping Use   Vaping status: Some Days  Substance and Sexual Activity   Alcohol use: No   Drug use: No   Sexual activity: Not Currently  Other Topics Concern   Not on file  Social History Narrative   Goes out to eat with sisters once a month   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    Goes to walking twice a week for 30 minutes    Social Drivers of Corporate investment banker Strain: Low Risk  (10/05/2020)   Overall Financial Resource Strain (CARDIA)    Difficulty of Paying Living Expenses: Not hard at all  Food Insecurity: No Food Insecurity (10/05/2020)   Hunger Vital Sign    Worried About Running Out of Food in the Last Year: Never true    Ran Out of Food in the Last Year: Never true  Transportation Needs: No Transportation Needs (10/05/2020)   PRAPARE - Administrator, Civil Service (Medical): No    Lack of Transportation (Non-Medical): No  Physical Activity: Insufficiently Active (10/05/2020)   Exercise Vital Sign    Days of Exercise per Week: 7 days    Minutes of Exercise per Session: 10 min  Stress: No Stress Concern Present (10/05/2020)   [employer] of Occupational Health - Occupational Stress Questionnaire    Feeling of Stress : Not at all  Social Connections: Moderately Integrated (10/05/2020)   Social Connection and Isolation Panel [NHANES]    Frequency of Communication with Friends and Family: More than three times a week    Frequency of Social Gatherings with Friends and Family: More than three  times a week    Attends Religious Services: More than 4 times per year    Active Member of [employer] or Organizations: Yes    Attends Engineer, structural: More than 4 times per year    Marital Status: Divorced  Intimate Partner Violence: Not At Risk (10/05/2020)   Humiliation, Afraid, Rape, and Kick questionnaire    Fear of Current or Ex-Partner: No    Emotionally Abused: No    Physically Abused: No    Sexually Abused: No   Social History   Tobacco Use  Smoking Status Some Days   Current packs/day: 0.00   Types: Cigarettes   Last attempt to quit: 11/18/2012   Years since quitting: 10.3  Smokeless Tobacco Never  Tobacco Comments   quit over 15 years ago    Social History   Substance and Sexual Activity  Alcohol Use No    Family History:  Family History  Problem Relation Age of Onset   Breast cancer Mother    Liver cancer Sister    Pancreatic cancer Sister    Stomach cancer Brother    Lung cancer Brother    Brain cancer Sister    Diabetes Father    Heart disease Father    Breast cancer Sister    Heart attack Brother    COPD Neg Hx    Stroke Neg Hx     Past medical history, surgical history, medications, allergies, family history and social history reviewed with patient today and changes made to appropriate areas of the chart.   Review of Systems  Eyes:  Negative for blurred vision and double vision.  Respiratory:  Negative for shortness of breath.   Cardiovascular:  Negative for chest pain, palpitations and leg swelling.  Neurological:  Negative for dizziness and headaches.  Psychiatric/Behavioral:  Positive for depression. Negative for suicidal ideas. The patient is nervous/anxious.    All other ROS negative except what is listed above and in the HPI.      Objective:    BP 139/82  Pulse 99   Temp 98.3 F (36.8 C) (Oral)   Wt 149 lb 9.6 oz (67.9 kg)   SpO2 94%   BMI 22.09 kg/m   Wt Readings from Last 3 Encounters:  04/07/23 149 lb 9.6 oz (67.9  kg)  12/17/22 151 lb (68.5 kg)  11/05/22 147 lb 12.8 oz (67 kg)    Physical Exam Vitals and nursing note reviewed.  Constitutional:      General: She is awake. She is not in acute distress.    Appearance: She is well-developed. She is not ill-appearing.  HENT:     Head: Normocephalic and atraumatic.     Right Ear: Hearing, tympanic membrane, ear canal and external ear normal. No drainage.     Left Ear: Hearing, tympanic membrane, ear canal and external ear normal. No drainage.     Nose: Nose normal.     Right Sinus: No maxillary sinus tenderness or frontal sinus tenderness.     Left Sinus: No maxillary sinus tenderness or frontal sinus tenderness.     Mouth/Throat:     Mouth: Mucous membranes are moist.     Pharynx: Oropharynx is clear. Uvula midline. No pharyngeal swelling, oropharyngeal exudate or posterior oropharyngeal erythema.  Eyes:     General: Lids are normal.        Right eye: No discharge.        Left eye: No discharge.     Extraocular Movements: Extraocular movements intact.     Conjunctiva/sclera: Conjunctivae normal.     Pupils: Pupils are equal, round, and reactive to light.     Visual Fields: Right eye visual fields normal and left eye visual fields normal.  Neck:     Thyroid: No thyromegaly.     Vascular: No carotid bruit.     Trachea: Trachea normal.  Cardiovascular:     Rate and Rhythm: Normal rate and regular rhythm.     Heart sounds: Normal heart sounds. No murmur heard.    No gallop.  Pulmonary:     Effort: Pulmonary effort is normal. No accessory muscle usage or respiratory distress.     Breath sounds: Normal breath sounds.  Chest:  Breasts:    Right: Normal.     Left: Normal.  Abdominal:     General: Bowel sounds are normal.     Palpations: Abdomen is soft. There is no hepatomegaly or splenomegaly.     Tenderness: There is no abdominal tenderness.  Musculoskeletal:        General: Normal range of motion.     Cervical back: Normal range of motion  and neck supple.     Right lower leg: No edema.     Left lower leg: No edema.  Lymphadenopathy:     Head:     Right side of head: No submental, submandibular, tonsillar, preauricular or posterior auricular adenopathy.     Left side of head: No submental, submandibular, tonsillar, preauricular or posterior auricular adenopathy.     Cervical: No cervical adenopathy.     Upper Body:     Right upper body: No supraclavicular, axillary or pectoral adenopathy.     Left upper body: No supraclavicular, axillary or pectoral adenopathy.  Skin:    General: Skin is warm and dry.     Capillary Refill: Capillary refill takes less than 2 seconds.     Findings: No rash.  Neurological:     Mental Status: She is alert and oriented to person, place, and time.     Gait:  Gait is intact.  Psychiatric:        Attention and Perception: Attention normal.        Mood and Affect: Mood normal.        Speech: Speech normal.        Behavior: Behavior normal. Behavior is cooperative.        Thought Content: Thought content normal.        Judgment: Judgment normal.     Results for orders placed or performed in visit on 11/05/22  Basic Metabolic Panel (BMET)   Collection Time: 11/05/22  4:29 PM  Result Value Ref Range   Glucose 111 (H) 70 - 99 mg/dL   BUN 10 8 - 27 mg/dL   Creatinine, Ser 8.41 0.57 - 1.00 mg/dL   eGFR 91 >32 GM/WNU/2.72   BUN/Creatinine Ratio 14 12 - 28   Sodium 146 (H) 134 - 144 mmol/L   Potassium 3.9 3.5 - 5.2 mmol/L   Chloride 106 96 - 106 mmol/L   CO2 25 20 - 29 mmol/L   Calcium 9.6 8.7 - 10.3 mg/dL  Magnesium   Collection Time: 11/05/22  4:29 PM  Result Value Ref Range   Magnesium 1.8 1.6 - 2.3 mg/dL      Assessment & Plan:   Problem List Items Addressed This Visit       Cardiovascular and Mediastinum   Hypertension associated with diabetes (HCC)   Chronic.  Controlled without medication.  Refills sent today.  Labs ordered today. Will make further recommendations based on  lab results. Continue to check blood pressures at home. If consistently elevated >140 call the office.  Return to clinic in 3 months for reevaluation.  Call sooner if concerns arise.       Aortic atherosclerosis (HCC)   Chronic.  Controlled.  Continue with current medication regimen on Pravastatin 40mg .  Refills sent today.  Labs ordered today.  Return to clinic in 3 months for reevaluation.  Call sooner if concerns arise.       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(HCC)   Chronic.  Controlled.   Continue with current medication regimen of Metformin 1000mg  BID.  Labs ordered today. Will make further recommendations based on lab results.  Return to clinic in 3 months for reevaluation.  Call sooner if concerns arise.       Relevant Orders   Hemoglobin A1c   Microalbumin, Urine Waived     Endocrine   Hyperlipidemia associated with type 2 diabetes mellitus (HCC)   Chronic.  Controlled.  Continue with current medication regimen of Pravastatin 40mg .   Labs ordered today.  Return to clinic in 3 months for reevaluation.  Call sooner if concerns arise.       Relevant Orders   Lipid panel     Other   Anxiety   Chronic. Not well controlled.  Zoloft increased to 100mg  daily.  Follow up in 1 month.  Call sooner if concerns arise.       Relevant Medications   sertraline (ZOLOFT) 100 MG tablet   Moderate episode of recurrent major depressive disorder (HCC)   Chronic. Not well controlled.  Zoloft increased to 100mg  daily.  Follow up in 1 month.  Call sooner if concerns arise.       Relevant Medications   sertraline (ZOLOFT) 100 MG tablet   Other Visit Diagnoses       Annual physical exam    -  Primary   Health maintenance reviewed during visit  today.  Labs ordered.  Vaccines reviewed.  Mammogram scheduled.  Colon cancer screening up to date.   Relevant Orders   CBC with Differential/Platelet   Comprehensive metabolic panel   Lipid panel   TSH   Urinalysis,  Routine w reflex microscopic   Hemoglobin A1c         Follow up plan: Return in about 1 month (around 05/08/2023) for Depression/Anxiety FU.   LABORATORY TESTING:  - Pap smear: not applicable  IMMUNIZATIONS:   - Tdap: Tetanus vaccination status reviewed: last tetanus booster within 10 years. - Influenza: Up to date - Pneumovax: Up to date - Prevnar: Up to date - COVID: Up to date - HPV: Not applicable - Shingrix vaccine: Up to date  SCREENING: -Mammogram:  Scheduled   - Colonoscopy: Up to date  - Bone Density: Up to date  -Hearing Test: Not applicable  -Spirometry: Not applicable   PATIENT COUNSELING:   Advised to take 1 mg of folate supplement per day if capable of pregnancy.   Sexuality: Discussed sexually transmitted diseases, partner selection, use of condoms, avoidance of unintended pregnancy  and contraceptive alternatives.   Advised to avoid cigarette smoking.  I discussed with the patient that most people either abstain from alcohol or drink within safe limits (<=14/week and <=4 drinks/occasion for males, <=7/weeks and <= 3 drinks/occasion for females) and that the risk for alcohol disorders and other health effects rises proportionally with the number of drinks per week and how often a drinker exceeds daily limits.  Discussed cessation/primary prevention of drug use and availability of treatment for abuse.   Diet: Encouraged to adjust caloric intake to maintain  or achieve ideal body weight, to reduce intake of dietary saturated fat and total fat, to limit sodium intake by avoiding high sodium foods and not adding table salt, and to maintain adequate dietary potassium and calcium preferably from fresh fruits, vegetables, and low-fat dairy products.    stressed the importance of regular exercise  Injury prevention: Discussed safety belts, safety helmets, smoke detector, smoking near bedding or upholstery.   Dental health: Discussed importance of regular tooth  brushing, flossing, and dental visits.    NEXT PREVENTATIVE PHYSICAL DUE IN 1 YEAR. Return in about 1 month (around 05/08/2023) for Depression/Anxiety FU.

## 2023-04-07 NOTE — Assessment & Plan Note (Signed)
 Chronic.  Controlled.  Continue with current medication regimen of Pravastatin 40mg .   Labs ordered today.  Return to clinic in 3 months for reevaluation.  Call sooner if concerns arise.

## 2023-04-08 LAB — CBC WITH DIFFERENTIAL/PLATELET
Basophils Absolute: 0.1 10*3/uL (ref 0.0–0.2)
Basos: 1 %
EOS (ABSOLUTE): 0.4 10*3/uL (ref 0.0–0.4)
Eos: 4 %
Hematocrit: 43.8 % (ref 34.0–46.6)
Hemoglobin: 14.3 g/dL (ref 11.1–15.9)
Immature Grans (Abs): 0.1 10*3/uL (ref 0.0–0.1)
Immature Granulocytes: 1 %
Lymphocytes Absolute: 2.2 10*3/uL (ref 0.7–3.1)
Lymphs: 21 %
MCH: 28.7 pg (ref 26.6–33.0)
MCHC: 32.6 g/dL (ref 31.5–35.7)
MCV: 88 fL (ref 79–97)
Monocytes Absolute: 0.8 10*3/uL (ref 0.1–0.9)
Monocytes: 8 %
Neutrophils Absolute: 7 10*3/uL (ref 1.4–7.0)
Neutrophils: 65 %
Platelets: 437 10*3/uL (ref 150–450)
RBC: 4.98 x10E6/uL (ref 3.77–5.28)
RDW: 14.5 % (ref 11.7–15.4)
WBC: 10.5 10*3/uL (ref 3.4–10.8)

## 2023-04-08 LAB — COMPREHENSIVE METABOLIC PANEL
ALT: 24 IU/L (ref 0–32)
AST: 16 IU/L (ref 0–40)
Albumin: 4.5 g/dL (ref 3.8–4.8)
Alkaline Phosphatase: 61 IU/L (ref 44–121)
BUN/Creatinine Ratio: 39 — ABNORMAL HIGH (ref 12–28)
BUN: 24 mg/dL (ref 8–27)
Bilirubin Total: 0.3 mg/dL (ref 0.0–1.2)
CO2: 25 mmol/L (ref 20–29)
Calcium: 9.3 mg/dL (ref 8.7–10.3)
Chloride: 101 mmol/L (ref 96–106)
Creatinine, Ser: 0.62 mg/dL (ref 0.57–1.00)
Globulin, Total: 2.3 g/dL (ref 1.5–4.5)
Glucose: 83 mg/dL (ref 70–99)
Potassium: 4.5 mmol/L (ref 3.5–5.2)
Sodium: 140 mmol/L (ref 134–144)
Total Protein: 6.8 g/dL (ref 6.0–8.5)
eGFR: 95 mL/min/{1.73_m2} (ref >59)

## 2023-04-08 LAB — LIPID PANEL
Chol/HDL Ratio: 4.6 ratio — ABNORMAL HIGH (ref 0.0–4.4)
Cholesterol, Total: 199 mg/dL (ref 100–199)
HDL: 43 mg/dL (ref >39)
LDL Chol Calc (NIH): 107 mg/dL — ABNORMAL HIGH (ref 0–99)
Triglycerides: 288 mg/dL — ABNORMAL HIGH (ref 0–149)
VLDL Cholesterol Cal: 49 mg/dL — ABNORMAL HIGH (ref 5–40)

## 2023-04-08 LAB — HEMOGLOBIN A1C
Est. average glucose Bld gHb Est-mCnc: 126 mg/dL
Hgb A1c MFr Bld: 6 % — ABNORMAL HIGH (ref 4.8–5.6)

## 2023-04-08 LAB — TSH: TSH: 1.29 u[IU]/mL (ref 0.450–4.500)

## 2023-04-21 ENCOUNTER — Encounter

## 2023-04-21 ENCOUNTER — Other Ambulatory Visit: Payer: Self-pay | Admitting: Nurse Practitioner

## 2023-04-21 DIAGNOSIS — G4700 Insomnia, unspecified: Secondary | ICD-10-CM

## 2023-04-22 ENCOUNTER — Other Ambulatory Visit: Payer: Self-pay | Admitting: Nurse Practitioner

## 2023-04-22 DIAGNOSIS — G4700 Insomnia, unspecified: Secondary | ICD-10-CM

## 2023-04-22 NOTE — Telephone Encounter (Signed)
 Medication refilled today 04/22/23.  Copied from CRM [id number]. Topic: Clinical - Prescription Issue >> Apr 22, 2023 11:44 AM [name] D wrote: Patient is calling to get this refilled she was told it has no refills-  BELSOMRA 20 MG TABS

## 2023-04-22 NOTE — Telephone Encounter (Signed)
 Requested medication (s) are due for refill today: yes  Requested medication (s) are on the active medication list: yes  Last refill:  03/25/23 #30/0  Future visit scheduled: no  Notes to clinic:  Unable to refill per protocol, medication not assigned to the refill protocol.      Requested Prescriptions  Pending Prescriptions Disp Refills   BELSOMRA 20 MG TABS [Pharmacy Med Name: BELSOMRA 20 MG TABLET] 30 tablet 0    Sig: Take 1 tablet (20 mg total) by mouth at bedtime as needed.     Off-Protocol Failed - 04/22/2023  1:20 PM      Failed - Medication not assigned to a protocol, review manually.      Passed - Valid encounter within last 12 months    Recent Outpatient Visits           2 weeks ago Annual physical exam   [facility] Larae Grooms, NP

## 2023-04-28 ENCOUNTER — Other Ambulatory Visit: Payer: Self-pay | Admitting: Nurse Practitioner

## 2023-04-28 LAB — HM DIABETES EYE EXAM

## 2023-04-28 NOTE — Telephone Encounter (Unsigned)
 Copied from CRM [id number]. Topic: Clinical - Medication Refill >> Apr 28, 2023  1:15 PM Shon Hale wrote: Most Recent Primary Care Visit:  Provider: Larae Grooms  Department: [facility]  Visit Type: OFFICE VISIT  Date: 04/07/2023  Medication: rizatriptan (MAXALT) 10 MG tablet  Has the patient contacted their pharmacy? No  Is this the correct pharmacy for this prescription? Yes This is the patient's preferred pharmacy:  [facility] - [place], [place] - [address] [place] [address] Phone: [phone] Fax: [phone]  Has the prescription been filled recently? No  Is the patient out of the medication? Yes  Has the patient been seen for an appointment in the last year OR does the patient have an upcoming appointment? Yes  Can we respond through MyChart? No  Agent: Please be advised that Rx refills may take up to 3 business days. We ask that you follow-up with your pharmacy.

## 2023-04-29 MED ORDER — RIZATRIPTAN BENZOATE 10 MG PO TABS: 10.0000 mg | ORAL_TABLET | ORAL | 2 refills | Status: DC | PRN

## 2023-04-29 NOTE — Telephone Encounter (Signed)
 Requested Prescriptions  Pending Prescriptions Disp Refills   rizatriptan (MAXALT) 10 MG tablet 10 tablet 2    Sig: Take 1 tablet (10 mg total) by mouth as needed for migraine. May repeat in 2 hours if needed     Neurology:  Migraine Therapy - Triptan Passed - 04/29/2023  2:03 PM      Passed - Last BP in normal range    BP Readings from Last 1 Encounters:  04/07/23 139/82         Passed - Valid encounter within last 12 months    Recent Outpatient Visits           3 weeks ago Annual physical exam   [facility] Larae Grooms, NP

## 2023-05-02 ENCOUNTER — Other Ambulatory Visit: Payer: Self-pay | Admitting: Nurse Practitioner

## 2023-05-02 NOTE — Telephone Encounter (Signed)
 Requested Prescriptions  Pending Prescriptions Disp Refills   buPROPion (WELLBUTRIN XL) 150 MG 24 hr tablet [Pharmacy Med Name: BUPROPION HCL XL 150 MG TABLET] 90 tablet 1    Sig: Take 1 tablet (150 mg total) by mouth daily.     Psychiatry: Antidepressants - bupropion Passed - 05/02/2023  4:06 PM      Passed - Cr in normal range and within 360 days    Creatinine, Ser  Date Value Ref Range Status  04/07/2023 0.62 0.57 - 1.00 mg/dL Final         Passed - AST in normal range and within 360 days    AST  Date Value Ref Range Status  04/07/2023 16 0 - 40 IU/L Final         Passed - ALT in normal range and within 360 days    ALT  Date Value Ref Range Status  04/07/2023 24 0 - 32 IU/L Final         Passed - Completed PHQ-2 or PHQ-9 in the last 360 days      Passed - Last BP in normal range    BP Readings from Last 1 Encounters:  04/07/23 139/82         Passed - Valid encounter within last 6 months    Recent Outpatient Visits           3 weeks ago Annual physical exam    [facility] Larae Grooms, NP

## 2023-05-05 ENCOUNTER — Ambulatory Visit
Admission: RE | Admit: 2023-05-05 | Discharge: 2023-05-05 | Disposition: A | Discharge status: Home / Self Care | Source: Ambulatory Visit | Attending: Nurse Practitioner | Admitting: Nurse Practitioner

## 2023-05-05 DIAGNOSIS — Z1231 Encounter for screening mammogram for malignant neoplasm of breast: Secondary | ICD-10-CM | POA: Insufficient documentation

## 2023-05-08 ENCOUNTER — Other Ambulatory Visit: Payer: Self-pay | Admitting: Nurse Practitioner

## 2023-05-08 DIAGNOSIS — R928 Other abnormal and inconclusive findings on diagnostic imaging of breast: Secondary | ICD-10-CM

## 2023-05-12 ENCOUNTER — Encounter: Payer: Self-pay | Admitting: Nurse Practitioner

## 2023-05-12 ENCOUNTER — Ambulatory Visit: Admitting: Nurse Practitioner

## 2023-05-12 ENCOUNTER — Ambulatory Visit (INDEPENDENT_AMBULATORY_CARE_PROVIDER_SITE_OTHER): Admitting: Nurse Practitioner

## 2023-05-12 VITALS — BP 137/80 | HR 88 | Temp 98.0°F | Resp 15 | Ht 69.02 in | Wt 146.6 lb

## 2023-05-12 DIAGNOSIS — F419 Anxiety disorder, unspecified: Secondary | ICD-10-CM | POA: Diagnosis not present

## 2023-05-12 DIAGNOSIS — F331 Major depressive disorder, recurrent, moderate: Secondary | ICD-10-CM

## 2023-05-12 MED ORDER — HYDROXYZINE PAMOATE 25 MG PO CAPS: ORAL_CAPSULE | ORAL | 0 refills | Status: DC

## 2023-05-12 NOTE — Assessment & Plan Note (Signed)
 Continue with Zoloft .  Recommend using hydroxyzine  PRN for anxiety.  Follow up in 3 months.  Call sooner if concerns arise.

## 2023-05-12 NOTE — Progress Notes (Signed)
 BP 137/80 (BP Location: Right Arm, Patient Position: Sitting, Cuff Size: Normal)   Pulse 88   Temp 98 F (36.7 C) (Oral)   Resp 15   Ht 5' 9.02" (1.753 m)   Wt 146 lb 9.6 oz (66.5 kg)   SpO2 97%   BMI 21.64 kg/m    Subjective:    Patient ID: Marcia [name], female    DOB: 1950/04/19, 73 y.o.   MRN: [id number]  HPI: Marcia [name] is a 73 y.o. female  Chief Complaint  Patient presents with   Depression/Anxiety     Doing better than she has been however has a repeat mammo tomorrow that she is thinking about.    ANXIETY Feels like she has had more anxiety and depression lately.  She would like to see if her Zoloft  can be increased.   She feels like her depression is better but her anxiety isn't great.  She is not using the hydroxyzine .     Relevant past medical, surgical, family and social history reviewed and updated as indicated. Interim medical history since our last visit reviewed. Allergies and medications reviewed and updated.  Review of Systems  Psychiatric/Behavioral:  Positive for dysphoric mood. The patient is nervous/anxious.     Per HPI unless specifically indicated above     Objective:    BP 137/80 (BP Location: Right Arm, Patient Position: Sitting, Cuff Size: Normal)   Pulse 88   Temp 98 F (36.7 C) (Oral)   Resp 15   Ht 5' 9.02" (1.753 m)   Wt 146 lb 9.6 oz (66.5 kg)   SpO2 97%   BMI 21.64 kg/m   Wt Readings from Last 3 Encounters:  05/12/23 146 lb 9.6 oz (66.5 kg)  04/07/23 149 lb 9.6 oz (67.9 kg)  12/17/22 151 lb (68.5 kg)    Physical Exam Vitals and nursing note reviewed.  Constitutional:      General: She is not in acute distress.    Appearance: Normal appearance. She is normal weight. She is not ill-appearing, toxic-appearing or diaphoretic.  HENT:     Head: Normocephalic.     Right Ear: External ear normal.     Left Ear: External ear normal.     Nose: Nose normal.     Mouth/Throat:     Mouth: Mucous membranes are moist.      Pharynx: Oropharynx is clear.  Eyes:     General:        Right eye: No discharge.        Left eye: No discharge.     Extraocular Movements: Extraocular movements intact.     Conjunctiva/sclera: Conjunctivae normal.     Pupils: Pupils are equal, round, and reactive to light.  Cardiovascular:     Rate and Rhythm: Normal rate and regular rhythm.     Heart sounds: No murmur heard. Pulmonary:     Effort: Pulmonary effort is normal. No respiratory distress.     Breath sounds: Normal breath sounds. No wheezing or rales.  Musculoskeletal:     Cervical back: Normal range of motion and neck supple.  Skin:    General: Skin is warm and dry.     Capillary Refill: Capillary refill takes less than 2 seconds.  Neurological:     General: No focal deficit present.     Mental Status: She is alert and oriented to person, place, and time. Mental status is at baseline.  Psychiatric:        Mood and Affect:  Mood normal.        Behavior: Behavior normal.        Thought Content: Thought content normal.        Judgment: Judgment normal.     Results for orders placed or performed in visit on 04/29/23  HM DIABETES EYE EXAM   Collection Time: 04/28/23 11:31 AM  Result Value Ref Range   HM Diabetic Eye Exam No Retinopathy No Retinopathy      Assessment & Plan:   Problem List Items Addressed This Visit       Other   Anxiety   Continue with Zoloft .  Recommend using hydroxyzine  PRN for anxiety.  Follow up in 3 months.  Call sooner if concerns arise.       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  Improved.  Continue with Zoloft  100mg  daily.  Follow up in 3 months.  Call sooner if concerns arise.       Relevant Medications   hydrOXYzine  (VISTARIL ) 25 MG capsule     Follow up plan: Return in about 3 months (around 08/11/2023) for HTN, HLD, DM2 FU.

## 2023-05-12 NOTE — Assessment & Plan Note (Signed)
 Chronic.  Improved.  Continue with Zoloft  100mg  daily.  Follow up in 3 months.  Call sooner if concerns arise.

## 2023-05-13 ENCOUNTER — Ambulatory Visit
Admission: RE | Admit: 2023-05-13 | Discharge: 2023-05-13 | Disposition: A | Discharge status: Home / Self Care | Source: Ambulatory Visit | Attending: Nurse Practitioner | Admitting: Nurse Practitioner

## 2023-05-13 ENCOUNTER — Ambulatory Visit
Admission: RE | Admit: 2023-05-13 | Discharge: 2023-05-13 | Discharge status: Home / Self Care | Source: Ambulatory Visit | Attending: Nurse Practitioner

## 2023-05-13 DIAGNOSIS — R928 Other abnormal and inconclusive findings on diagnostic imaging of breast: Secondary | ICD-10-CM

## 2023-05-14 NOTE — Telephone Encounter (Unsigned)
 Copied from CRM [id number]. Topic: Appointments - Scheduling Inquiry for Clinic >> May 14, 2023  8:11 AM Marcia [name] wrote: Reason for CRM: Patient is looking for someone that can cut her toenails. She is no longer able to do so with her diabetes. Please reach out to the patient if the procedure is available. Callback number is [phone]

## 2023-05-21 ENCOUNTER — Other Ambulatory Visit: Payer: Self-pay | Admitting: Nurse Practitioner

## 2023-05-21 DIAGNOSIS — G4700 Insomnia, unspecified: Secondary | ICD-10-CM

## 2023-05-22 ENCOUNTER — Other Ambulatory Visit: Payer: Self-pay | Admitting: Nurse Practitioner

## 2023-05-23 ENCOUNTER — Other Ambulatory Visit: Payer: Self-pay | Admitting: Nurse Practitioner

## 2023-05-23 NOTE — Telephone Encounter (Signed)
 Copied from CRM [id number]. Topic: Clinical - Prescription Issue >> May 23, 2023  1:15 PM Bridgette Campus T wrote: Reason for CRM: metFORMIN  (GLUCOPHAGE ) 500 MG tablet- completely out BELSOMRA  20 MG TABS gabapentin  (NEURONTIN ) 300 MG capsule  Still waiting for refill-  [phone] >> May 23, 2023  4:44 PM Ethelle Herb L wrote: Patient calling about rx requests. Advised rx requests are still pending and typical turnaround time is up to 3 business days.

## 2023-05-23 NOTE — Telephone Encounter (Signed)
 Forwarded to PCP for review

## 2023-05-24 NOTE — Telephone Encounter (Signed)
 Requested medication (s) are due for refill today: yes  Requested medication (s) are on the active medication list: yes es Last refill:  04/22/23  Future visit scheduled: no  Notes to clinic:  Medication not assigned to a protocol, review manually.      Requested Prescriptions  Pending Prescriptions Disp Refills   BELSOMRA  20 MG TABS [Pharmacy Med Name: BELSOMRA  20 MG TABLET] 30 tablet 0    Sig: Take 1 tablet (20 mg total) by mouth at bedtime as needed.     Off-Protocol Failed - 05/24/2023 10:59 AM      Failed - Medication not assigned to a protocol, review manually.      Passed - Valid encounter within last 12 months    Recent Outpatient Visits           1 week ago Moderate episode of recurrent major depressive disorder [facility])   [place] [facility] Aileen Alexanders, NP   1 month ago Annual physical exam   [place] [facility] Aileen Alexanders, NP

## 2023-05-26 ENCOUNTER — Other Ambulatory Visit: Payer: Self-pay | Admitting: Nurse Practitioner

## 2023-05-26 NOTE — Telephone Encounter (Signed)
 Belsomra  20 mg sent to pharmacy 05/26/23, no request for gabapentin , will request refill.  Copied from CRM [id number]. Topic: Clinical - Prescription Issue >> May 26, 2023  7:40 AM Oddis Bench wrote: Reason for CRM: Patient is calling for two medications that were sent to the pharmacy on last week BELSOMRA  20 MG TABS and gabapentin  (NEURONTIN ) 300 MG capsule, per the notes the pharmacy is asking for office to contact them bc one is stating not signed to protocol and the other states that there is a requested follow up for it on 05/24/2023. Patient is stating that she is out of the gabapentin  (NEURONTIN ) 300 MG capsule.

## 2023-05-26 NOTE — Telephone Encounter (Signed)
 Copied from CRM [id number]. Topic: Clinical - Medication Refill >> May 26, 2023  7:44 AM Oddis Bench wrote: Most Recent Primary Care Visit:  Provider: Aileen Alexanders  Department: [facility] [address] [facility]  Visit Type: OFFICE VISIT  Date: 05/12/2023  Medication: buPROPion  (WELLBUTRIN  XL) 150 MG 24 hr tablet  Has the patient contacted their pharmacy? No Patient is not sure if she has refill for med.   Is this the correct pharmacy for this prescription? Yes If no, delete pharmacy and type the correct one.  This is the patient's preferred pharmacy:  [facility] - [place], [place] - [address] [place] [address] Phone: [phone] Fax: [phone]     Has the prescription been filled recently? No  Is the patient out of the medication? Yes  Has the patient been seen for an appointment in the last year OR does the patient have an upcoming appointment? Yes  Can we respond through MyChart? No  Agent: Please be advised that Rx refills may take up to 3 business days. We ask that you follow-up with your pharmacy.

## 2023-05-26 NOTE — Telephone Encounter (Signed)
 [facility] [employer] called and spoke to [employer], P[employer] consultant about the refill(s) Wellbutrin  requested. Advised it was sent on 05/02/23 #90/1 refill(s). She says the patient has refills available and she can refill this. She asked did the patient call us , advised yes. She says when she called the pharmacy this morning, patient didn't mention about wellbutrin , but she can get it ready. She also says the gabapentin  patient didn't mention the dosage after I advised the request is for 300 mg. She says the patient picked up the gabapentin  300 mg on 04/28/23 3 month supply and also gabapentin  100 mg on 03/24/23 #90 pills. Advised the 100 mg was discontinued by the provider on 05/12/23. Will send this to the provider since it seems patient may not be taking the correct gabapentin .

## 2023-05-26 NOTE — Telephone Encounter (Signed)
 Returned call to patient seeking clarification after pharmacy called stating that patient is not due for Wellbutrin  refill at this time as she received 90 day supply in April.   Based on the rest of the messages it is not clear what is needed at this time. PCP has sent to refills to pharmacy for today that we due.

## 2023-05-26 NOTE — Telephone Encounter (Signed)
 Spoke with patient. She is aware not she was requesting a prescription based on her bottle from January being empty. She will find the new one as it must be at home somewhere.

## 2023-06-02 ENCOUNTER — Other Ambulatory Visit: Payer: Self-pay | Admitting: Nurse Practitioner

## 2023-06-03 ENCOUNTER — Other Ambulatory Visit: Payer: Self-pay | Admitting: Nurse Practitioner

## 2023-06-03 ENCOUNTER — Other Ambulatory Visit: Payer: Self-pay

## 2023-06-03 NOTE — Telephone Encounter (Signed)
 Copied from CRM [id number]. Topic: Clinical - Medication Question >> Jun 03, 2023  9:01 AM Everette C wrote: Reason for CRM: The patient has called for an update on previous discussions related to refilling their prescription for gabapentin  (NEURONTIN ) 100 MG capsule/   Please contact the patient further when possible

## 2023-06-03 NOTE — Telephone Encounter (Signed)
 Routing to provider. RX is due for refill according to chart.

## 2023-06-04 MED ORDER — GABAPENTIN 300 MG PO CAPS: 300.0000 mg | ORAL_CAPSULE | Freq: Two times a day (BID) | ORAL | 1 refills | Status: DC

## 2023-06-10 ENCOUNTER — Other Ambulatory Visit: Payer: Self-pay | Admitting: Nurse Practitioner

## 2023-06-11 NOTE — Telephone Encounter (Signed)
 Requested Prescriptions  Pending Prescriptions Disp Refills   hydrOXYzine  (VISTARIL ) 25 MG capsule [Pharmacy Med Name: HYDROXYZINE  PAM 25 MG CAP] 90 capsule 0    Sig: TAKE 1 CAPSULE BY MOUTH THREE TIMES DAILY AS NEEDED FOR ANXIETY     Ear, Nose, and Throat:  Antihistamines 2 Passed - 06/11/2023  4:26 PM      Passed - Cr in normal range and within 360 days    Creatinine, Ser  Date Value Ref Range Status  04/07/2023 0.62 0.57 - 1.00 mg/dL Final         Passed - Valid encounter within last 12 months    Recent Outpatient Visits           1 month ago Moderate episode of recurrent major depressive disorder (HCC)   [place] [facility] Aileen Alexanders, NP   2 months ago Annual physical exam   [place] [facility] Aileen Alexanders, NP

## 2023-06-20 ENCOUNTER — Other Ambulatory Visit: Payer: Self-pay | Admitting: Nurse Practitioner

## 2023-06-23 NOTE — Telephone Encounter (Signed)
 Requested Prescriptions  Pending Prescriptions Disp Refills   pravastatin  (PRAVACHOL ) 40 MG tablet [Pharmacy Med Name: PRAVASTATIN  SODIUM 40 MG TAB] 90 tablet 0    Sig: Take 1 tablet (40 mg total) by mouth daily.     Cardiovascular:  Antilipid - Statins Failed - 06/23/2023 11:14 AM      Failed - Lipid Panel in normal range within the last 12 months    Cholesterol, Total  Date Value Ref Range Status  04/07/2023 199 100 - 199 mg/dL Final   LDL Chol Calc (NIH)  Date Value Ref Range Status  04/07/2023 107 (H) 0 - 99 mg/dL Final   HDL  Date Value Ref Range Status  04/07/2023 43 >39 mg/dL Final   Triglycerides  Date Value Ref Range Status  04/07/2023 288 (H) 0 - 149 mg/dL Final         Passed - Patient is not pregnant      Passed - Valid encounter within last 12 months    Recent Outpatient Visits           1 month ago Moderate episode of recurrent major depressive disorder (HCC)   [place] Aileen Alexanders, NP   2 months ago Annual physical exam    [facility] Aileen Alexanders, NP

## 2023-07-15 ENCOUNTER — Other Ambulatory Visit: Payer: Self-pay | Admitting: Nurse Practitioner

## 2023-07-16 NOTE — Telephone Encounter (Signed)
 Requested Prescriptions  Pending Prescriptions Disp Refills   hydrOXYzine  (VISTARIL ) 25 MG capsule [Pharmacy Med Name: HYDROXYZINE  PAM 25 MG CAP] 90 capsule 0    Sig: TAKE 1 CAPSULE BY MOUTH THREE TIMES DAILY AS NEEDED FOR ANXIETY     Ear, Nose, and Throat:  Antihistamines 2 Passed - 07/16/2023  3:21 PM      Passed - Cr in normal range and within 360 days    Creatinine, Ser  Date Value Ref Range Status  04/07/2023 0.62 0.57 - 1.00 mg/dL Final         Passed - Valid encounter within last 12 months    Recent Outpatient Visits           2 months ago Moderate episode of recurrent major depressive disorder [facility])   [place] [facility] Melvin Pao, NP   3 months ago Annual physical exam   [place] [facility] Melvin Pao, NP

## 2023-07-28 ENCOUNTER — Other Ambulatory Visit: Payer: Self-pay | Admitting: Nurse Practitioner

## 2023-07-30 NOTE — Telephone Encounter (Signed)
 Requested Prescriptions  Pending Prescriptions Disp Refills   rizatriptan  (MAXALT ) 10 MG tablet [Pharmacy Med Name: RIZATRIPTAN  10 MG TABLET] 10 tablet 0    Sig: Take 1 tablet (10 mg total) by mouth as needed for migraine. May repeat in 2 hours if needed     Neurology:  Migraine Therapy - Triptan Passed - 07/30/2023 12:22 PM      Passed - Last BP in normal range    BP Readings from Last 1 Encounters:  05/12/23 137/80         Passed - Valid encounter within last 12 months    Recent Outpatient Visits           2 months ago Moderate episode of recurrent major depressive disorder [facility])   [place] [facility] Melvin Pao, NP   3 months ago Annual physical exam   [place] [facility] Melvin Pao, NP

## 2023-08-11 ENCOUNTER — Encounter: Payer: Self-pay | Admitting: Nurse Practitioner

## 2023-08-11 ENCOUNTER — Ambulatory Visit: Admitting: Nurse Practitioner

## 2023-08-11 VITALS — BP 147/82 | HR 102 | Temp 98.1°F | Wt 140.2 lb

## 2023-08-11 DIAGNOSIS — I700 Atherosclerosis of aorta: Secondary | ICD-10-CM | POA: Diagnosis not present

## 2023-08-11 DIAGNOSIS — E1169 Type 2 diabetes mellitus with other specified complication: Secondary | ICD-10-CM

## 2023-08-11 DIAGNOSIS — I152 Hypertension secondary to endocrine disorders: Secondary | ICD-10-CM

## 2023-08-11 DIAGNOSIS — F419 Anxiety disorder, unspecified: Secondary | ICD-10-CM

## 2023-08-11 DIAGNOSIS — E1159 Type 2 diabetes mellitus with other circulatory complications: Secondary | ICD-10-CM | POA: Diagnosis not present

## 2023-08-11 DIAGNOSIS — F331 Major depressive disorder, recurrent, moderate: Secondary | ICD-10-CM | POA: Diagnosis not present

## 2023-08-11 DIAGNOSIS — E785 Hyperlipidemia, unspecified: Secondary | ICD-10-CM

## 2023-08-11 MED ORDER — GABAPENTIN 300 MG PO CAPS: 300.0000 mg | ORAL_CAPSULE | Freq: Every day | ORAL | 1 refills | Status: DC

## 2023-08-11 MED ORDER — GABAPENTIN 100 MG PO CAPS: 100.0000 mg | ORAL_CAPSULE | Freq: Three times a day (TID) | ORAL | 1 refills | Status: DC

## 2023-08-11 MED ORDER — BUPROPION HCL ER (XL) 300 MG PO TB24: 300.0000 mg | ORAL_TABLET | Freq: Every day | ORAL | 1 refills | Status: DC

## 2023-08-11 NOTE — Assessment & Plan Note (Signed)
 Chronic.  Elevated at visit today.  Patient was upset during visit.  If blood pressure remains elevated at next visit can add medication.   Return to clinic in 1 months for reevaluation.  Call sooner if concerns arise.

## 2023-08-11 NOTE — Assessment & Plan Note (Signed)
 Chronic.  Controlled.  Continue with current medication regimen on Pravastatin 40mg .  Refills sent today.  Labs ordered today.  Return to clinic in 3 months for reevaluation.  Call sooner if concerns arise.

## 2023-08-11 NOTE — Assessment & Plan Note (Signed)
 Chronic.  Not doing well.  Very tearful during visit.  Will increase Wellbutrin  to 300mg  daily.  Continue with Zoloft  100mg .  Can increase dose at next visit if not well controlled.  Follow up in 1 month.

## 2023-08-11 NOTE — Assessment & Plan Note (Signed)
 Chronic.  Controlled.   Continue with current medication regimen of Metformin 1000mg  BID.  Labs ordered today. Will make further recommendations based on lab results.  Return to clinic in 3 months for reevaluation.  Call sooner if concerns arise.

## 2023-08-11 NOTE — Progress Notes (Signed)
 BP (!) 147/82   Pulse (!) 102   Temp 98.1 F (36.7 C) (Oral)   Wt 140 lb 3.2 oz (63.6 kg)   SpO2 98%   BMI 20.69 kg/m    Subjective:    Patient ID: Marcia [name], female    DOB: [birth year], 73 y.o.   MRN: [id number]  HPI: MARIPOSA SHORES is a 73 y.o. female  Chief Complaint  Patient presents with   Depression   HYPERTENSION / HYPERLIPIDEMIA Satisfied with current treatment? yes Duration of hypertension: years BP monitoring frequency: not checking BP range: BP medication side effects: no Past BP meds: nothing at this time Duration of hyperlipidemia: years Cholesterol medication side effects: no Cholesterol supplements: none Past cholesterol medications: pravastatin  (pravachol ) Medication compliance: excellent compliance Aspirin: no Recent stressors: no Recurrent headaches: no Visual changes: no Palpitations: no Dyspnea: no Chest pain: no Lower extremity edema: no Dizzy/lightheaded: no  DIABETES Hypoglycemic episodes:no Polydipsia/polyuria: no Visual disturbance: no Chest pain: no Paresthesias: no Glucose Monitoring: yes  Accucheck frequency: Daily  Fasting glucose: low 100s  Post prandial:  Evening:  Before meals: Taking Insulin?: no  Long acting insulin:  Short acting insulin: Blood Pressure Monitoring: daily Retinal Examination: Up to Date- Dr. Mevelyn April 7 Foot Exam: Up to Date Diabetic Education: Not Completed Pneumovax: Up to Date Influenza: Not up to Date Aspirin: no   ANXIETY Feels like she has had more anxiety and depression lately.  She would like to see if her Zoloft  can be increased.   She is very sad about her sister having dementia.  Would like to increase her dose of medication. Her nerves are not good right now.    Relevant past medical, surgical, family and social history reviewed and updated as indicated. Interim medical history since our last visit reviewed. Allergies and medications reviewed and updated.  Review of  Systems  Eyes:  Negative for visual disturbance.  Respiratory:  Negative for cough, chest tightness and shortness of breath.   Cardiovascular:  Negative for chest pain, palpitations and leg swelling.  Endocrine: Negative for polydipsia and polyuria.  Neurological:  Negative for dizziness, numbness and headaches.  Psychiatric/Behavioral:  Positive for dysphoric mood. The patient is nervous/anxious.     Per HPI unless specifically indicated above     Objective:    BP (!) 147/82   Pulse (!) 102   Temp 98.1 F (36.7 C) (Oral)   Wt 140 lb 3.2 oz (63.6 kg)   SpO2 98%   BMI 20.69 kg/m   Wt Readings from Last 3 Encounters:  08/11/23 140 lb 3.2 oz (63.6 kg)  05/12/23 146 lb 9.6 oz (66.5 kg)  04/07/23 149 lb 9.6 oz (67.9 kg)    Physical Exam Vitals and nursing note reviewed.  Constitutional:      General: She is not in acute distress.    Appearance: Normal appearance. She is normal weight. She is not ill-appearing, toxic-appearing or diaphoretic.  HENT:     Head: Normocephalic.     Right Ear: External ear normal.     Left Ear: External ear normal.     Nose: Nose normal.     Mouth/Throat:     Mouth: Mucous membranes are moist.     Pharynx: Oropharynx is clear.  Eyes:     General:        Right eye: No discharge.        Left eye: No discharge.     Extraocular Movements: Extraocular movements intact.  Conjunctiva/sclera: Conjunctivae normal.     Pupils: Pupils are equal, round, and reactive to light.  Cardiovascular:     Rate and Rhythm: Normal rate and regular rhythm.     Heart sounds: No murmur heard. Pulmonary:     Effort: Pulmonary effort is normal. No respiratory distress.     Breath sounds: Normal breath sounds. No wheezing or rales.  Musculoskeletal:     Cervical back: Normal range of motion and neck supple.  Skin:    General: Skin is warm and dry.     Capillary Refill: Capillary refill takes less than 2 seconds.  Neurological:     General: No focal deficit  present.     Mental Status: She is alert and oriented to person, place, and time. Mental status is at baseline.  Psychiatric:        Mood and Affect: Affect is tearful.        Behavior: Behavior normal.        Thought Content: Thought content normal.        Judgment: Judgment normal.     Results for orders placed or performed in visit on 04/29/23  HM DIABETES EYE EXAM   Collection Time: 04/28/23 11:31 AM  Result Value Ref Range   HM Diabetic Eye Exam No Retinopathy No Retinopathy      Assessment & Plan:   Problem List Items Addressed This Visit       Cardiovascular and Mediastinum   Hypertension associated with diabetes (HCC)   Chronic.  Elevated at visit today.  Patient was upset during visit.  If blood pressure remains elevated at next visit can add medication.   Return to clinic in 1 months for reevaluation.  Call sooner if concerns arise.       Aortic atherosclerosis (HCC)   Chronic.  Controlled.  Continue with current medication regimen on Pravastatin  40mg .  Refills sent today.  Labs ordered today.  Return to clinic in 3 months for reevaluation.  Call sooner if concerns arise.       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  Controlled.   Continue with current medication regimen of Metformin  1000mg  BID.  Labs ordered today. Will make further recommendations based on lab results.  Return to clinic in 3 months for reevaluation.  Call sooner if concerns arise.       Relevant Orders   Comprehensive metabolic panel with GFR   Hemoglobin A1c     Endocrine   Hyperlipidemia associated with type 2 diabetes mellitus (HCC)   Chronic.  Controlled.  Continue with current medication regimen of Pravastatin  40mg .   Labs ordered today.  Return to clinic in 3 months for reevaluation.  Call sooner if concerns arise.       Relevant Orders   Lipid panel     Other   Anxiety   Chronic.  Not doing well.  Very tearful during visit.  Will  increase Wellbutrin  to 300mg  daily.  Continue with Zoloft  100mg .  Can increase dose at next visit if not well controlled.  Follow up in 1 month.      Relevant Medications   buPROPion  (WELLBUTRIN  XL) 300 MG 24 hr tablet   Moderate episode of recurrent major depressive disorder (HCC)   Chronic.  Not doing well.  Very tearful during visit.  Will increase Wellbutrin  to 300mg  daily.  Continue with Zoloft  100mg .  Can increase dose at next visit if not well controlled.  Follow up  in 1 month.      Relevant Medications   buPROPion  (WELLBUTRIN  XL) 300 MG 24 hr tablet     Follow up plan: Return in about 1 month (around 09/11/2023) for Depression/Anxiety FU.

## 2023-08-11 NOTE — Assessment & Plan Note (Signed)
 Chronic.  Controlled.  Continue with current medication regimen of Pravastatin 40mg .   Labs ordered today.  Return to clinic in 3 months for reevaluation.  Call sooner if concerns arise.

## 2023-08-12 ENCOUNTER — Other Ambulatory Visit: Payer: Self-pay | Admitting: Nurse Practitioner

## 2023-08-12 ENCOUNTER — Ambulatory Visit: Payer: Self-pay | Admitting: Nurse Practitioner

## 2023-08-12 LAB — COMPREHENSIVE METABOLIC PANEL WITH GFR
ALT: 28 IU/L (ref 0–32)
AST: 21 IU/L (ref 0–40)
Albumin: 4.5 g/dL (ref 3.8–4.8)
Alkaline Phosphatase: 52 IU/L (ref 44–121)
BUN/Creatinine Ratio: 17 (ref 12–28)
BUN: 15 mg/dL (ref 8–27)
Bilirubin Total: 0.5 mg/dL (ref 0.0–1.2)
CO2: 26 mmol/L (ref 20–29)
Calcium: 9.9 mg/dL (ref 8.7–10.3)
Chloride: 105 mmol/L (ref 96–106)
Creatinine, Ser: 0.86 mg/dL (ref 0.57–1.00)
Globulin, Total: 2.2 g/dL (ref 1.5–4.5)
Glucose: 90 mg/dL (ref 70–99)
Potassium: 4 mmol/L (ref 3.5–5.2)
Sodium: 147 mmol/L — ABNORMAL HIGH (ref 134–144)
Total Protein: 6.7 g/dL (ref 6.0–8.5)
eGFR: 72 mL/min/1.73 (ref >59)

## 2023-08-12 LAB — LIPID PANEL
Chol/HDL Ratio: 3.3 ratio (ref 0.0–4.4)
Cholesterol, Total: 196 mg/dL (ref 100–199)
HDL: 59 mg/dL (ref >39)
LDL Chol Calc (NIH): 115 mg/dL — ABNORMAL HIGH (ref 0–99)
Triglycerides: 124 mg/dL (ref 0–149)
VLDL Cholesterol Cal: 22 mg/dL (ref 5–40)

## 2023-08-12 LAB — HEMOGLOBIN A1C
Est. average glucose Bld gHb Est-mCnc: 126 mg/dL
Hgb A1c MFr Bld: 6 % — ABNORMAL HIGH (ref 4.8–5.6)

## 2023-08-14 ENCOUNTER — Ambulatory Visit: Payer: Self-pay | Admitting: Nurse Practitioner

## 2023-08-14 NOTE — Telephone Encounter (Signed)
 Requested Prescriptions  Pending Prescriptions Disp Refills   hydrOXYzine  (VISTARIL ) 25 MG capsule [Pharmacy Med Name: HYDROXYZINE  PAM 25 MG CAP] 90 capsule 0    Sig: TAKE 1 CAPSULE BY MOUTH THREE TIMES DAILY AS NEEDED FOR ANXIETY     Ear, Nose, and Throat:  Antihistamines 2 Passed - 08/14/2023 10:57 AM      Passed - Cr in normal range and within 360 days    Creatinine, Ser  Date Value Ref Range Status  08/11/2023 0.86 0.57 - 1.00 mg/dL Final         Passed - Valid encounter within last 12 months    Recent Outpatient Visits           3 days ago 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[facility])   [place] [facility] Melvin Pao, NP   3 months ago Moderate episode of recurrent major depressive disorder [facility])   [place] [facility] Melvin Pao, NP   4 months ago Annual physical exam   [place] [facility] Melvin Pao, NP

## 2023-08-14 NOTE — Telephone Encounter (Signed)
 Patient will need an appointment to come in for an evaluation before medication can be prescribed as this was not discussed at her visit earlier this week. Please call to schedule.

## 2023-08-14 NOTE — Telephone Encounter (Signed)
 FYI Only or Action Required?: Action required by provider: update on patient condition.  Patient was last seen in primary care on 08/11/2023 by Melvin Pao, NP.  Called Nurse Triage reporting Back Pain.  Symptoms began yesterday.  Interventions attempted: Nothing.  Symptoms are: unchanged.  Triage Disposition: See HCP Within 4 Hours (Or PCP Triage)  Patient/caregiver understands and will follow disposition?: No, wishes to speak with PCP                  Copied from CRM #[id number]. Topic: Clinical - Red Word Triage >> Aug 14, 2023  2:35 PM Tobias L wrote: Red Word that prompted transfer to Nurse Triage: lower back pain, rating pain at 7 Reason for Disposition  [1] SEVERE back pain (e.g., excruciating, unable to do any normal activities) AND [2] not improved 2 hours after pain medicine  Answer Assessment - Initial Assessment Questions Patient states she cannot come in for an appointment today or tomorrow as she does not have transportation for either day. Pt is requesting medication for pain sent to:   [facility] - [place], [place] - [address], [place] [address]    1. ONSET: When did the pain begin? (e.g., minutes, hours, days)     Yesterday 2. LOCATION: Where does it hurt? (upper, mid or lower back)     Lower back 3. SEVERITY: How bad is the pain?  (e.g., Scale 1-10; mild, moderate, or severe)     7/10 pain 4. PATTERN: Is the pain constant? (e.g., yes, no; constant, intermittent)      Intermittent with walking, hurts with movement 5. RADIATION: Does the pain shoot into your legs or somewhere else?     No 7. BACK OVERUSE:  Any recent lifting of heavy objects, strenuous work or exercise?     Pt thinks she hurt it from picking up a heavy trash bag 8. MEDICINES: What have you taken so far for the pain? (e.g., nothing, acetaminophen , NSAIDS)     No  Protocols used: Back Pain-A-AH

## 2023-08-15 ENCOUNTER — Ambulatory Visit: Payer: Self-pay

## 2023-08-15 NOTE — Telephone Encounter (Signed)
 Called patient and left a message for her to call back to get scheduled.

## 2023-08-15 NOTE — Telephone Encounter (Signed)
 FYI Only or Action Required?: FYI only for provider.  Patient was last seen in primary care on 08/11/2023 by Melvin Pao, NP.  Called Nurse Triage reporting Back Pain.  Symptoms began several days ago.  Interventions attempted: Nothing.  Symptoms are: unchanged.  Triage Disposition: See PCP When Office is Open (Within 3 Days)  Patient/caregiver understands and will follow disposition?: Yes                             Copied from CRM #[id number]. Topic: Clinical - Red Word Triage >> Aug 15, 2023  4:36 PM Winona R wrote: Extreme back pain: Pt calling to schedule appointment for extreme back pain. Pt would like to come in Monday morning Reason for Disposition  [1] MODERATE back pain (e.g., interferes with normal activities) AND [2] present > 3 days  Answer Assessment - Initial Assessment Questions 1. ONSET: When did the pain begin? (e.g., minutes, hours, days)     Wednesday/Thursday of this week 2. LOCATION: Where does it hurt? (upper, mid or lower back)     Right, lower back 3. SEVERITY: How bad is the pain?  (e.g., Scale 1-10; mild, moderate, or severe)     Describes pain as sharp, rates pain an 8 when it is present  4. PATTERN: Is the pain constant? (e.g., yes, no; constant, intermittent)      States pain goes away completely when she lays down 5. RADIATION: Does the pain shoot into your legs or somewhere else?     States pain stays localized to back  6. CAUSE:  What do you think is causing the back pain?      Unsure 7. BACK OVERUSE:  Any recent lifting of heavy objects, strenuous work or exercise?     Denies 8. MEDICINES: What have you taken so far for the pain? (e.g., nothing, acetaminophen , NSAIDS)     Denies 9. NEUROLOGIC SYMPTOMS: Do you have any weakness, numbness, or problems with bowel/bladder control?     Denies weakness, denies numbness, states she can walk, but it is painful 10. OTHER SYMPTOMS: Do you have any other  symptoms? (e.g., fever, abdomen pain, burning with urination, blood in urine)     Denies fever, denies abdominal pain, denies problems with using restroom    Patient returning call from office to schedule appointment. Scheduled patient for first available appointment in office.  Protocols used: Back Pain-A-AH

## 2023-08-18 ENCOUNTER — Other Ambulatory Visit: Payer: Self-pay | Admitting: Nurse Practitioner

## 2023-08-18 DIAGNOSIS — G4700 Insomnia, unspecified: Secondary | ICD-10-CM

## 2023-08-18 NOTE — Telephone Encounter (Signed)
Scheduled for 7/29.

## 2023-08-19 ENCOUNTER — Ambulatory Visit: Admitting: Nurse Practitioner

## 2023-08-19 NOTE — Telephone Encounter (Signed)
 Requested medications are due for refill today.  yes  Requested medications are on the active medications list.  yes  Last refill. 05/26/2023 #30 2 rf  Future visit scheduled.   yes  Notes to clinic.  Refill not delegated.    Requested Prescriptions  Pending Prescriptions Disp Refills   BELSOMRA  20 MG TABS [Pharmacy Med Name: BELSOMRA  20 MG TABLET] 30 tablet 0    Sig: Take 1 tablet (20 mg total) by mouth at bedtime as needed.     Off-Protocol Failed - 08/19/2023  3:18 PM      Failed - Medication not assigned to a protocol, review manually.      Passed - Valid encounter within last 12 months    Recent Outpatient Visits           1 week ago 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[facility])   [facility] Melvin Pao, NP   3 months ago Moderate episode of recurrent major depressive disorder [facility])   [place] [facility] Melvin Pao, NP   4 months ago Annual physical exam   [place] [facility] Melvin Pao, NP

## 2023-09-01 ENCOUNTER — Other Ambulatory Visit: Payer: Self-pay | Admitting: Nurse Practitioner

## 2023-09-04 NOTE — Telephone Encounter (Signed)
 Requested Prescriptions  Pending Prescriptions Disp Refills   rizatriptan (MAXALT) 10 MG tablet [Pharmacy Med Name: RIZATRIPTAN 10 MG TABLET] 10 tablet 2    Sig: Take 1 tablet (10 mg total) by mouth as needed for migraine. May repeat in 2 hours if needed     Neurology:  Migraine Therapy - Triptan Failed - 09/04/2023 11:42 AM      Failed - Last BP in normal range    BP Readings from Last 1 Encounters:  08/11/23 (!) 147/82         Passed - Valid encounter within last 12 months    Recent Outpatient Visits           3 weeks ago 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[facility])   [facility] Melvin Pao, NP   3 months ago Moderate episode of recurrent major depressive disorder [facility])   [place] [facility] Melvin Pao, NP   5 months ago Annual physical exam   [place] [facility] Melvin Pao, NP

## 2023-09-15 ENCOUNTER — Ambulatory Visit: Admitting: Nurse Practitioner

## 2023-09-15 NOTE — Progress Notes (Deleted)
 There were no vitals taken for this visit.   Subjective:    Patient ID: Marcia [name], female    DOB: May 13, 1950, 73 y.o.   MRN: [id number]  HPI: Marcia [name] is a 73 y.o. female  No chief complaint on file.  HYPERTENSION / HYPERLIPIDEMIA Satisfied with current treatment? yes Duration of hypertension: years BP monitoring frequency: not checking BP range: BP medication side effects: no Past BP meds: nothing at this time Duration of hyperlipidemia: years Cholesterol medication side effects: no Cholesterol supplements: none Past cholesterol medications: pravastatin  (pravachol ) Medication compliance: excellent compliance Aspirin: no Recent stressors: no Recurrent headaches: no Visual changes: no Palpitations: no Dyspnea: no Chest pain: no Lower extremity edema: no Dizzy/lightheaded: no  ANXIETY Feels like she has had more anxiety and depression lately.  She would like to see if her Zoloft  can be increased.   She is very sad about her sister having dementia.  Would like to increase her dose of medication. Her nerves are not good right now.    Relevant past medical, surgical, family and social history reviewed and updated as indicated. Interim medical history since our last visit reviewed. Allergies and medications reviewed and updated.  Review of Systems  Eyes:  Negative for visual disturbance.  Respiratory:  Negative for cough, chest tightness and shortness of breath.   Cardiovascular:  Negative for chest pain, palpitations and leg swelling.  Endocrine: Negative for polydipsia and polyuria.  Neurological:  Negative for dizziness, numbness and headaches.  Psychiatric/Behavioral:  Positive for dysphoric mood. The patient is nervous/anxious.     Per HPI unless specifically indicated above     Objective:    There were no vitals taken for this visit.  Wt Readings from Last 3 Encounters:  08/11/23 140 lb 3.2 oz (63.6 kg)  05/12/23 146 lb 9.6 oz (66.5 kg)  04/07/23  149 lb 9.6 oz (67.9 kg)    Physical Exam Vitals and nursing note reviewed.  Constitutional:      General: She is not in acute distress.    Appearance: Normal appearance. She is normal weight. She is not ill-appearing, toxic-appearing or diaphoretic.  HENT:     Head: Normocephalic.     Right Ear: External ear normal.     Left Ear: External ear normal.     Nose: Nose normal.     Mouth/Throat:     Mouth: Mucous membranes are moist.     Pharynx: Oropharynx is clear.  Eyes:     General:        Right eye: No discharge.        Left eye: No discharge.     Extraocular Movements: Extraocular movements intact.     Conjunctiva/sclera: Conjunctivae normal.     Pupils: Pupils are equal, round, and reactive to light.  Cardiovascular:     Rate and Rhythm: Normal rate and regular rhythm.     Heart sounds: No murmur heard. Pulmonary:     Effort: Pulmonary effort is normal. No respiratory distress.     Breath sounds: Normal breath sounds. No wheezing or rales.  Musculoskeletal:     Cervical back: Normal range of motion and neck supple.  Skin:    General: Skin is warm and dry.     Capillary Refill: Capillary refill takes less than 2 seconds.  Neurological:     General: No focal deficit present.     Mental Status: She is alert and oriented to person, place, and time. Mental status is at baseline.  Psychiatric:        Mood and Affect: Affect is tearful.        Behavior: Behavior normal.        Thought Content: Thought content normal.        Judgment: Judgment normal.     Results for orders placed or performed in visit on 08/11/23  Comprehensive metabolic panel with GFR   Collection Time: 08/11/23  2:18 PM  Result Value Ref Range   Glucose 90 70 - 99 mg/dL   BUN 15 8 - 27 mg/dL   Creatinine, Ser 9.13 0.57 - 1.00 mg/dL   eGFR 72 >40 fO/fpw/8.26   BUN/Creatinine Ratio 17 12 - 28   Sodium 147 (H) 134 - 144 mmol/L   Potassium 4.0 3.5 - 5.2 mmol/L   Chloride 105 96 - 106 mmol/L   CO2 26  20 - 29 mmol/L   Calcium 9.9 8.7 - 10.3 mg/dL   Total Protein 6.7 6.0 - 8.5 g/dL   Albumin 4.5 3.8 - 4.8 g/dL   Globulin, Total 2.2 1.5 - 4.5 g/dL   Bilirubin Total 0.5 0.0 - 1.2 mg/dL   Alkaline Phosphatase 52 44 - 121 IU/L   AST 21 0 - 40 IU/L   ALT 28 0 - 32 IU/L  Hemoglobin A1c   Collection Time: 08/11/23  2:18 PM  Result Value Ref Range   Hgb A1c MFr Bld 6.0 (H) 4.8 - 5.6 %   Est. average glucose Bld gHb Est-mCnc 126 mg/dL  Lipid panel   Collection Time: 08/11/23  2:18 PM  Result Value Ref Range   Cholesterol, Total 196 100 - 199 mg/dL   Triglycerides 875 0 - 149 mg/dL   HDL 59 >60 mg/dL   VLDL Cholesterol Cal 22 5 - 40 mg/dL   LDL Chol Calc (NIH) 884 (H) 0 - 99 mg/dL   Chol/HDL Ratio 3.3 0.0 - 4.4 ratio      Assessment & Plan:   Problem List Items Addressed This Visit   None     Follow up plan: No follow-ups on file.

## 2023-09-23 ENCOUNTER — Other Ambulatory Visit: Payer: Self-pay | Admitting: Nurse Practitioner

## 2023-09-23 NOTE — Telephone Encounter (Signed)
 Requested Prescriptions  Pending Prescriptions Disp Refills   pravastatin  (PRAVACHOL ) 40 MG tablet [Pharmacy Med Name: PRAVASTATIN  SODIUM 40 MG TAB] 90 tablet 3    Sig: Take 1 tablet (40 mg total) by mouth daily.     Cardiovascular:  Antilipid - Statins Failed - 09/23/2023  4:58 PM      Failed - Lipid Panel in normal range within the last 12 months    Cholesterol, Total  Date Value Ref Range Status  08/11/2023 196 100 - 199 mg/dL Final   LDL Chol Calc (NIH)  Date Value Ref Range Status  08/11/2023 115 (H) 0 - 99 mg/dL Final   HDL  Date Value Ref Range Status  08/11/2023 59 >39 mg/dL Final   Triglycerides  Date Value Ref Range Status  08/11/2023 124 0 - 149 mg/dL Final         Passed - Patient is not pregnant      Passed - Valid encounter within last 12 months    Recent Outpatient Visits           1 month ago 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[facility])   [place] [facility] Melvin Pao, NP   4 months ago Moderate episode of recurrent major depressive disorder [facility])   [place] [facility] Melvin Pao, NP   5 months ago Annual physical exam   [place] [facility] Melvin Pao, NP

## 2023-10-05 ENCOUNTER — Telehealth: Payer: Self-pay

## 2023-10-05 NOTE — Telephone Encounter (Signed)
 Ok for E2C2 to review.  Patient aware her appointment for tomorrow 10/06/2023 with PCP has been cancelled and we will call ASAP with a rescheduled date. She does ask that it be only scheduled for a Monday as that is the only day she is able to have transportation to her appointments.

## 2023-10-06 ENCOUNTER — Ambulatory Visit: Admitting: Nurse Practitioner

## 2023-10-07 NOTE — Telephone Encounter (Signed)
Scheduled for 9/29

## 2023-10-10 ENCOUNTER — Other Ambulatory Visit: Payer: Self-pay | Admitting: Nurse Practitioner

## 2023-10-10 NOTE — Telephone Encounter (Signed)
 Requested Prescriptions  Pending Prescriptions Disp Refills   hydrOXYzine  (VISTARIL ) 25 MG capsule [Pharmacy Med Name: HYDROXYZINE  PAM 25 MG CAP] 90 capsule 0    Sig: TAKE 1 CAPSULE BY MOUTH THREE TIMES DAILY AS NEEDED FOR ANXIETY     Ear, Nose, and Throat:  Antihistamines 2 Passed - 10/10/2023  4:07 PM      Passed - Cr in normal range and within 360 days    Creatinine, Ser  Date Value Ref Range Status  08/11/2023 0.86 0.57 - 1.00 mg/dL Final         Passed - Valid encounter within last 12 months    Recent Outpatient Visits           2 months ago 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[facility])   [place] [facility] Melvin Pao, NP   5 months ago Moderate episode of recurrent major depressive disorder [facility])   [place] [facility] Melvin Pao, NP   6 months ago Annual physical exam   [place] [facility] Melvin Pao, NP

## 2023-10-13 ENCOUNTER — Telehealth: Payer: Self-pay

## 2023-10-13 NOTE — Telephone Encounter (Signed)
 Copied from CRM #[id number]. Topic: Clinical - Medication Question >> Oct 10, 2023  3:17 PM Delon T wrote: Reason for CRM: hydrOXYzine  (VISTARIL ) 25 MG capsule- checking status of refill

## 2023-10-13 NOTE — Telephone Encounter (Signed)
 Prescription sent in Friday for the patient. Called and confirmed that she picked up her medication.

## 2023-10-20 ENCOUNTER — Encounter: Payer: Self-pay | Admitting: Nurse Practitioner

## 2023-10-20 ENCOUNTER — Ambulatory Visit (INDEPENDENT_AMBULATORY_CARE_PROVIDER_SITE_OTHER): Admitting: Nurse Practitioner

## 2023-10-20 ENCOUNTER — Telehealth: Payer: Self-pay

## 2023-10-20 VITALS — BP 136/74 | HR 98 | Wt 148.0 lb

## 2023-10-20 DIAGNOSIS — M7071 Other bursitis of hip, right hip: Secondary | ICD-10-CM | POA: Diagnosis not present

## 2023-10-20 DIAGNOSIS — M7072 Other bursitis of hip, left hip: Secondary | ICD-10-CM

## 2023-10-20 DIAGNOSIS — Z23 Encounter for immunization: Secondary | ICD-10-CM

## 2023-10-20 DIAGNOSIS — F331 Major depressive disorder, recurrent, moderate: Secondary | ICD-10-CM | POA: Diagnosis not present

## 2023-10-20 MED ORDER — METHYLPREDNISOLONE 4 MG PO TBPK: ORAL_TABLET | ORAL | 0 refills | Status: DC

## 2023-10-20 MED ORDER — SERTRALINE HCL 25 MG PO TABS: 25.0000 mg | ORAL_TABLET | Freq: Every day | ORAL | 0 refills | Status: DC

## 2023-10-20 NOTE — Progress Notes (Signed)
 BP 136/74   Pulse 98   Wt 148 lb (67.1 kg)   SpO2 98%   BMI 21.85 kg/m    Subjective:    Patient ID: Marcia [name], female    DOB: [birth year], 73 y.o.   MRN: [id number]  HPI: Marcia [name] is a 72 y.o. female  Chief Complaint  Patient presents with   Depression   Leg Pain   ANXIETY Feels like she has had more anxiety and depression lately.  States she has only been taking the Wellbutrin .  She would like to go back on the Sertraline .   Patient states she is having bursitis in both her legs.  She isn't able to get her shots until next week.   She is having a significant amount of pain and wondering if there is anything that can be done to help her now.    Relevant past medical, surgical, family and social history reviewed and updated as indicated. Interim medical history since our last visit reviewed. Allergies and medications reviewed and updated.  Review of Systems  Musculoskeletal:        Bilateral hip pain  Psychiatric/Behavioral:  Positive for dysphoric mood. Negative for suicidal ideas. The patient is nervous/anxious.     Per HPI unless specifically indicated above     Objective:    BP 136/74   Pulse 98   Wt 148 lb (67.1 kg)   SpO2 98%   BMI 21.85 kg/m   Wt Readings from Last 3 Encounters:  10/20/23 148 lb (67.1 kg)  08/11/23 140 lb 3.2 oz (63.6 kg)  05/12/23 146 lb 9.6 oz (66.5 kg)    Physical Exam Vitals and nursing note reviewed.  Constitutional:      General: She is not in acute distress.    Appearance: Normal appearance. She is normal weight. She is not ill-appearing, toxic-appearing or diaphoretic.  HENT:     Head: Normocephalic.     Right Ear: External ear normal.     Left Ear: External ear normal.     Nose: Nose normal.     Mouth/Throat:     Mouth: Mucous membranes are moist.     Pharynx: Oropharynx is clear.  Eyes:     General:        Right eye: No discharge.        Left eye: No discharge.     Extraocular Movements: Extraocular  movements intact.     Conjunctiva/sclera: Conjunctivae normal.     Pupils: Pupils are equal, round, and reactive to light.  Cardiovascular:     Rate and Rhythm: Normal rate and regular rhythm.     Heart sounds: No murmur heard. Pulmonary:     Effort: Pulmonary effort is normal. No respiratory distress.     Breath sounds: Normal breath sounds. No wheezing or rales.  Musculoskeletal:     Cervical back: Normal range of motion and neck supple.  Skin:    General: Skin is warm and dry.     Capillary Refill: Capillary refill takes less than 2 seconds.  Neurological:     General: No focal deficit present.     Mental Status: She is alert and oriented to person, place, and time. Mental status is at baseline.  Psychiatric:        Mood and Affect: Mood normal.        Behavior: Behavior normal.        Thought Content: Thought content normal.        Judgment:  Judgment normal.     Results for orders placed or performed in visit on 08/11/23  Comprehensive metabolic panel with GFR   Collection Time: 08/11/23  2:18 PM  Result Value Ref Range   Glucose 90 70 - 99 mg/dL   BUN 15 8 - 27 mg/dL   Creatinine, Ser 9.13 0.57 - 1.00 mg/dL   eGFR 72 >40 fO/fpw/8.26   BUN/Creatinine Ratio 17 12 - 28   Sodium 147 (H) 134 - 144 mmol/L   Potassium 4.0 3.5 - 5.2 mmol/L   Chloride 105 96 - 106 mmol/L   CO2 26 20 - 29 mmol/L   Calcium 9.9 8.7 - 10.3 mg/dL   Total Protein 6.7 6.0 - 8.5 g/dL   Albumin 4.5 3.8 - 4.8 g/dL   Globulin, Total 2.2 1.5 - 4.5 g/dL   Bilirubin Total 0.5 0.0 - 1.2 mg/dL   Alkaline Phosphatase 52 44 - 121 IU/L   AST 21 0 - 40 IU/L   ALT 28 0 - 32 IU/L  Hemoglobin A1c   Collection Time: 08/11/23  2:18 PM  Result Value Ref Range   Hgb A1c MFr Bld 6.0 (H) 4.8 - 5.6 %   Est. average glucose Bld gHb Est-mCnc 126 mg/dL  Lipid panel   Collection Time: 08/11/23  2:18 PM  Result Value Ref Range   Cholesterol, Total 196 100 - 199 mg/dL   Triglycerides 875 0 - 149 mg/dL   HDL 59 >60  mg/dL   VLDL Cholesterol Cal 22 5 - 40 mg/dL   LDL Chol Calc (NIH) 884 (H) 0 - 99 mg/dL   Chol/HDL Ratio 3.3 0.0 - 4.4 ratio      Assessment & Plan:   Problem List Items Addressed This Visit       Other   Moderate episode of recurrent major depressive disorder (HCC)   Chronic. Not well controlled.  Has only been taking Wellbutrin .  Will restart Sertraline  25mg  daily.  If tolerating well after two weeks okay to increase to 50mg .  Follow up in 1 month.  Call sooner if concerns arise.       Relevant Medications   sertraline  (ZOLOFT ) 25 MG tablet   Other Visit Diagnoses       Immunization due    -  Primary   Relevant Orders   Pfizer Comirnaty Covid-19 Vaccine 63yrs & older (Completed)   Flu vaccine HIGH DOSE PF(Fluzone Trivalent) (Completed)     Bursitis of both hips, unspecified bursa       Will treat with medrol  dose pak.  Keep appointment for injections next week.        Follow up plan: Return in about 1 month (around 11/19/2023) for HTN, HLD, DM2 FU.

## 2023-10-20 NOTE — Assessment & Plan Note (Signed)
 Chronic. Not well controlled.  Has only been taking Wellbutrin .  Will restart Sertraline  25mg  daily.  If tolerating well after two weeks okay to increase to 50mg .  Follow up in 1 month.  Call sooner if concerns arise.

## 2023-10-20 NOTE — Telephone Encounter (Signed)
 Copied from CRM [id number]. Topic: Appointments - Scheduling Inquiry for Clinic >> Oct 17, 2023  4:42 PM Harlene ORN wrote: Reason for CRM: Patient called requesting to have a flu shot done on her appointment on Monday.

## 2023-11-13 ENCOUNTER — Other Ambulatory Visit: Payer: Self-pay | Admitting: Nurse Practitioner

## 2023-11-13 DIAGNOSIS — G4700 Insomnia, unspecified: Secondary | ICD-10-CM

## 2023-11-14 ENCOUNTER — Other Ambulatory Visit: Payer: Self-pay | Admitting: Nurse Practitioner

## 2023-11-14 NOTE — Telephone Encounter (Signed)
 Requested medications are due for refill today.  yes  Requested medications are on the active medications list.  yes  Last refill. 08/20/2023 #30 2 rf  Future visit scheduled.   yes  Notes to clinic.  Medication not assigned to a protocol. Please review for refill.    Requested Prescriptions  Pending Prescriptions Disp Refills   BELSOMRA  20 MG TABS [Pharmacy Med Name: BELSOMRA  20 MG TABLET] 30 tablet 0    Sig: Take 1 tablet (20 mg total) by mouth at bedtime as needed.     Off-Protocol Failed - 11/14/2023  5:12 PM      Failed - Medication not assigned to a protocol, review manually.      Passed - Valid encounter within last 12 months    Recent Outpatient Visits           3 weeks ago Immunization due   [place] [facility], Darice, NP   3 months ago 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[facility])   [place] [facility] Melvin Darice, NP   6 months ago Moderate episode of recurrent major depressive disorder [facility])   [place] [facility] Melvin Darice, NP   7 months ago Annual physical exam   [place] [facility] Melvin Darice, NP

## 2023-11-17 NOTE — Telephone Encounter (Signed)
 Requested Prescriptions  Pending Prescriptions Disp Refills   hydrOXYzine  (VISTARIL ) 25 MG capsule [Pharmacy Med Name: HYDROXYZINE  PAM 25 MG CAP] 90 capsule 2    Sig: TAKE 1 CAPSULE BY MOUTH THREE TIMES DAILY AS NEEDED FOR ANXIETY     Ear, Nose, and Throat:  Antihistamines 2 Passed - 11/17/2023  1:38 PM      Passed - Cr in normal range and within 360 days    Creatinine, Ser  Date Value Ref Range Status  08/11/2023 0.86 0.57 - 1.00 mg/dL Final         Passed - Valid encounter within last 12 months    Recent Outpatient Visits           4 weeks ago Immunization due   [place] [facility], Darice, NP   3 months ago 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[facility])   [place] [facility] Melvin Darice, NP   6 months ago Moderate episode of recurrent major depressive disorder [facility])   [place] [facility] Melvin Darice, NP   7 months ago Annual physical exam   [place] [facility] Melvin Darice, NP

## 2023-11-24 ENCOUNTER — Ambulatory Visit: Admitting: Nurse Practitioner

## 2023-11-25 ENCOUNTER — Ambulatory Visit: Admitting: Nurse Practitioner

## 2023-11-25 ENCOUNTER — Encounter: Payer: Self-pay | Admitting: Nurse Practitioner

## 2023-11-25 VITALS — BP 125/77 | HR 94 | Temp 97.9°F | Ht 69.0 in | Wt 147.4 lb

## 2023-11-25 DIAGNOSIS — E1169 Type 2 diabetes mellitus with other specified complication: Secondary | ICD-10-CM | POA: Diagnosis not present

## 2023-11-25 DIAGNOSIS — F331 Major depressive disorder, recurrent, moderate: Secondary | ICD-10-CM

## 2023-11-25 DIAGNOSIS — E1159 Type 2 diabetes mellitus with other circulatory complications: Secondary | ICD-10-CM

## 2023-11-25 DIAGNOSIS — I700 Atherosclerosis of aorta: Secondary | ICD-10-CM | POA: Diagnosis not present

## 2023-11-25 DIAGNOSIS — Z0000 Encounter for general adult medical examination without abnormal findings: Secondary | ICD-10-CM | POA: Diagnosis not present

## 2023-11-25 DIAGNOSIS — Z7189 Other specified counseling: Secondary | ICD-10-CM | POA: Diagnosis not present

## 2023-11-25 DIAGNOSIS — E785 Hyperlipidemia, unspecified: Secondary | ICD-10-CM

## 2023-11-25 DIAGNOSIS — Z7984 Long term (current) use of oral hypoglycemic drugs: Secondary | ICD-10-CM

## 2023-11-25 DIAGNOSIS — F419 Anxiety disorder, unspecified: Secondary | ICD-10-CM

## 2023-11-25 DIAGNOSIS — I152 Hypertension secondary to endocrine disorders: Secondary | ICD-10-CM

## 2023-11-25 MED ORDER — GABAPENTIN 300 MG PO CAPS: 300.0000 mg | ORAL_CAPSULE | Freq: Every day | ORAL | 1 refills | Status: AC

## 2023-11-25 MED ORDER — GABAPENTIN 100 MG PO CAPS: 100.0000 mg | ORAL_CAPSULE | Freq: Three times a day (TID) | ORAL | 1 refills | Status: AC

## 2023-11-25 MED ORDER — SERTRALINE HCL 50 MG PO TABS: 50.0000 mg | ORAL_TABLET | Freq: Every day | ORAL | 1 refills | Status: AC

## 2023-11-25 MED ORDER — ONDANSETRON HCL 4 MG PO TABS: 4.0000 mg | ORAL_TABLET | Freq: Three times a day (TID) | ORAL | 2 refills | Status: AC | PRN

## 2023-11-25 NOTE — Assessment & Plan Note (Signed)
 Chronic. Improved but could be better.  Continue with Wellbutrin .  Increased Sertraline  to 50mg  daily.   If tolerating well after two weeks okay to increase to 50mg .  Follow up in 3 month.  Call sooner if concerns arise.

## 2023-11-25 NOTE — Progress Notes (Signed)
 BP 125/77   Pulse 94   Temp 97.9 F (36.6 C) (Oral)   Ht 5' 9 (1.753 m)   Wt 147 lb 6.4 oz (66.9 kg)   SpO2 96%   BMI 21.77 kg/m    Subjective:    Patient ID: Marcia [name], female    DOB: 12-12-1950, 73 y.o.   MRN: [id number]  HPI: Marcia [name] is a 73 y.o. female  Chief Complaint  Patient presents with   Medicare Wellness   Diabetes   Hyperlipidemia   Hypertension   HYPERTENSION / HYPERLIPIDEMIA Satisfied with current treatment? yes Duration of hypertension: years BP monitoring frequency: not checking BP range: BP medication side effects: no Past BP meds: nothing at this time Duration of hyperlipidemia: years Cholesterol medication side effects: no Cholesterol supplements: none Past cholesterol medications: pravastatin  (pravachol ) Medication compliance: excellent compliance Aspirin: no Recent stressors: no Recurrent headaches: no Visual changes: no Palpitations: no Dyspnea: no Chest pain: no Lower extremity edema: no Dizzy/lightheaded: no  DIABETES Taking metformin  1000mg  BID.  Hypoglycemic episodes:no Polydipsia/polyuria: no Visual disturbance: no Chest pain: no Paresthesias: no Glucose Monitoring: yes  Accucheck frequency: Daily  Fasting glucose: low 100s  Post prandial:  Evening:  Before meals: Taking Insulin?: no  Long acting insulin:  Short acting insulin: Blood Pressure Monitoring: daily Retinal Examination: Up to Date- Dr. Mevelyn April 7 Foot Exam: Up to Date Diabetic Education: Not Completed Pneumovax: Up to Date Influenza: Not up to Date Aspirin: no   ANXIETY Feels like she has had more anxiety and depression lately.  She would like to see if her Zoloft  can be increased.  She does feel like it is helping with her anxiety but feels like it could be improved.   She is very sad about her sister having dementia.  Would like to increase her dose of medication. She does have some nausea at times and asking for a prescription for  zofran . Denies SI.  Flowsheet Row Office Visit from 11/25/2023 in [facility]  PHQ-9 Total Score 0      10/20/2023    2:39 PM 08/11/2023    1:39 PM 05/12/2023    2:58 PM 04/07/2023    3:40 PM  GAD 7 : Generalized Anxiety Score  Nervous, Anxious, on Edge 0 2 0 0  Control/stop worrying 3 3 0 2  Worry too much - different things 3 2 0 1  Trouble relaxing 0 1 0 1  Restless 0 0 0 0  Easily annoyed or irritable 0 0 0 0  Afraid - awful might happen 0 0 0 0  Total GAD 7 Score 6 8 0 4  Anxiety Difficulty Not difficult at all  Not difficult at all Not difficult at all      Feels like she has had more anxiety and depression lately.  States she has only been taking the Wellbutrin .  She would like to go back on the Sertraline .   Relevant past medical, surgical, family and social history reviewed and updated as indicated. Interim medical history since our last visit reviewed. Allergies and medications reviewed and updated.  Review of Systems  Eyes:  Negative for visual disturbance.  Respiratory:  Negative for cough, chest tightness and shortness of breath.   Cardiovascular:  Negative for chest pain, palpitations and leg swelling.  Endocrine: Negative for polydipsia and polyuria.  Neurological:  Negative for dizziness, numbness and headaches.  Psychiatric/Behavioral:  Positive for dysphoric mood. Negative for suicidal ideas. The patient  is nervous/anxious.     Per HPI unless specifically indicated above     Objective:    BP 125/77   Pulse 94   Temp 97.9 F (36.6 C) (Oral)   Ht 5' 9 (1.753 m)   Wt 147 lb 6.4 oz (66.9 kg)   SpO2 96%   BMI 21.77 kg/m   Wt Readings from Last 3 Encounters:  11/25/23 147 lb 6.4 oz (66.9 kg)  10/20/23 148 lb (67.1 kg)  08/11/23 140 lb 3.2 oz (63.6 kg)    Physical Exam Vitals and nursing note reviewed.  Constitutional:      General: She is not in acute distress.    Appearance: Normal appearance. She is not ill-appearing,  toxic-appearing or diaphoretic.  HENT:     Head: Normocephalic.     Right Ear: External ear normal.     Left Ear: External ear normal.     Nose: Nose normal.     Mouth/Throat:     Mouth: Mucous membranes are moist.     Pharynx: Oropharynx is clear.  Eyes:     General:        Right eye: No discharge.        Left eye: No discharge.     Extraocular Movements: Extraocular movements intact.     Conjunctiva/sclera: Conjunctivae normal.     Pupils: Pupils are equal, round, and reactive to light.  Cardiovascular:     Rate and Rhythm: Normal rate and regular rhythm.     Heart sounds: No murmur heard. Pulmonary:     Effort: Pulmonary effort is normal. No respiratory distress.     Breath sounds: Normal breath sounds. No wheezing or rales.  Musculoskeletal:     Cervical back: Normal range of motion and neck supple.  Skin:    General: Skin is warm and dry.     Capillary Refill: Capillary refill takes less than 2 seconds.  Neurological:     General: No focal deficit present.     Mental Status: She is alert and oriented to person, place, and time. Mental status is at baseline.  Psychiatric:        Mood and Affect: Mood normal.        Behavior: Behavior normal.        Thought Content: Thought content normal.        Judgment: Judgment normal.     Results for orders placed or performed in visit on 08/11/23  Comprehensive metabolic panel with GFR   Collection Time: 08/11/23  2:18 PM  Result Value Ref Range   Glucose 90 70 - 99 mg/dL   BUN 15 8 - 27 mg/dL   Creatinine, Ser 9.13 0.57 - 1.00 mg/dL   eGFR 72 >40 fO/fpw/8.26   BUN/Creatinine Ratio 17 12 - 28   Sodium 147 (H) 134 - 144 mmol/L   Potassium 4.0 3.5 - 5.2 mmol/L   Chloride 105 96 - 106 mmol/L   CO2 26 20 - 29 mmol/L   Calcium 9.9 8.7 - 10.3 mg/dL   Total Protein 6.7 6.0 - 8.5 g/dL   Albumin 4.5 3.8 - 4.8 g/dL   Globulin, Total 2.2 1.5 - 4.5 g/dL   Bilirubin Total 0.5 0.0 - 1.2 mg/dL   Alkaline Phosphatase 52 44 - 121 IU/L    AST 21 0 - 40 IU/L   ALT 28 0 - 32 IU/L  Hemoglobin A1c   Collection Time: 08/11/23  2:18 PM  Result Value Ref Range   Hgb A1c  MFr Bld 6.0 (H) 4.8 - 5.6 %   Est. average glucose Bld gHb Est-mCnc 126 mg/dL  Lipid panel   Collection Time: 08/11/23  2:18 PM  Result Value Ref Range   Cholesterol, Total 196 100 - 199 mg/dL   Triglycerides 875 0 - 149 mg/dL   HDL 59 >60 mg/dL   VLDL Cholesterol Cal 22 5 - 40 mg/dL   LDL Chol Calc (NIH) 884 (H) 0 - 99 mg/dL   Chol/HDL Ratio 3.3 0.0 - 4.4 ratio      Assessment & Plan:   Problem List Items Addressed This Visit       Cardiovascular and Mediastinum   Hypertension associated with diabetes (HCC)   Chronic.  Controlled.  Continue with current medication regimen.  Labs ordered today.  Return to clinic in 3 months for reevaluation.  Call sooner if concerns arise.        Relevant Orders   Comprehensive metabolic panel with GFR   Aortic atherosclerosis   Chronic.  Controlled.  Continue with current medication regimen on Pravastatin  40mg .  Refills sent today.  Labs ordered today.  Return to clinic in 3 months for reevaluation.  Call sooner if concerns arise.       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(HCC)   Chronic.  Controlled.   Continue with current medication regimen of Metformin  1000mg  BID.  Eye exam and foot exam up to date.  Labs ordered today. Will make further recommendations based on lab results.  Return to clinic in 3 months for reevaluation.  Call sooner if concerns arise.       Relevant Orders   Hemoglobin A1c     Endocrine   Hyperlipidemia associated with type 2 diabetes mellitus (HCC)   Chronic.  Controlled.  Continue with current medication regimen of Pravastatin  40mg .   Labs ordered today.  Return to clinic in 6 months for reevaluation.  Call sooner if concerns arise.       Relevant Orders   Lipid panel     Other   Anxiety   Chronic. Improved but could be better.  Continue with  Wellbutrin .  Increased Sertraline  to 50mg  daily.   If tolerating well after two weeks okay to increase to 50mg .  Follow up in 3 month.  Call sooner if concerns arise.       Relevant Medications   sertraline  (ZOLOFT ) 50 MG tablet   Advanced care planning/counseling discussion   A voluntary discussion about advance care planning including the explanation and discussion of advance directives was extensively discussed  with the patient for 5 minutes with patient.  Explanation about the health care proxy and Living will was reviewed and packet with forms with explanation of how to fill them out was given.  She plans to fill out the paperwork required.  Patient was offered a separate Advance Care Planning visit for further assistance with forms.         Moderate episode of recurrent major depressive disorder (HCC)   Chronic. Improved but could be better.  Continue with Wellbutrin .  Increased Sertraline  to 50mg  daily.   If tolerating well after two weeks okay to increase to 50mg .  Follow up in 3 month.  Call sooner if concerns arise.       Relevant Medications   sertraline  (ZOLOFT ) 50 MG tablet   Other Visit Diagnoses       Encounter for Medicare annual wellness exam    -  Primary  Follow up plan: Return in 3 months (on 02/25/2024) for HTN, HLD, DM2 FU.

## 2023-11-25 NOTE — Assessment & Plan Note (Signed)
 A voluntary discussion about advance care planning including the explanation and discussion of advance directives was extensively discussed  with the patient for 5 minutes with patient.  Explanation about the health care proxy and Living will was reviewed and packet with forms with explanation of how to fill them out was given.  She plans to fill out the paperwork required.  Patient was offered a separate Advance Care Planning visit for further assistance with forms.

## 2023-11-25 NOTE — Assessment & Plan Note (Signed)
Chronic.  Controlled.  Continue with current medication regimen.  Labs ordered today.  Return to clinic in 3 months for reevaluation.  Call sooner if concerns arise.   

## 2023-11-25 NOTE — Progress Notes (Signed)
 Subjective:   Marcia [name] is a 73 y.o. female who presents for a Medicare Annual Wellness Visit.  Allergies (verified) Patient has no known allergies.   History: Past Medical History:  Diagnosis Date   Anxiety    Colon cancer screening    Depression    Hyperlipidemia    Hypertension    Perforated bowel (HCC) 12/06/2017   Past Surgical History:  Procedure Laterality Date   BREAST BIOPSY Left 2015   CORE W/CLIP - NEG   BREAST BIOPSY Left 07/2007   neg bx    CESAREAN SECTION     COLON SURGERY     COLONOSCOPY WITH PROPOFOL  N/A 02/18/2018   Procedure: COLONOSCOPY WITH PROPOFOL ;  Surgeon: Therisa Bi, MD;  Location: [facility] ENDOSCOPY;  Service: Gastroenterology;  Laterality: N/A;   COLONOSCOPY WITH PROPOFOL  N/A 03/25/2018   Procedure: COLONOSCOPY WITH PROPOFOL ;  Surgeon: Therisa Bi, MD;  Location: [facility] ENDOSCOPY;  Service: Gastroenterology;  Laterality: N/A;   COLONOSCOPY WITH PROPOFOL  N/A 08/25/2018   Procedure: COLONOSCOPY WITH PROPOFOL ;  Surgeon: Unk Corinn Skiff, MD;  Location: [facility] ENDOSCOPY;  Service: Gastroenterology;  Laterality: N/A;   LAPAROTOMY N/A 12/06/2017   Procedure: EXPLORATORY LAPAROTOMY, sigmoid colectomy, anastomosis;  Surgeon: Nicholaus Selinda Birmingham, MD;  Location: [facility] ORS;  Service: General;  Laterality: N/A;   TUBAL LIGATION     XI ROBOTIC ASSISTED VENTRAL HERNIA N/A 11/26/2018   Procedure: XI ROBOTIC ASSISTED VENTRAL HERNIA;  Surgeon: Jordis Laneta FALCON, MD;  Location: [facility] ORS;  Service: General;  Laterality: N/A;   Family History  Problem Relation Age of Onset   Breast cancer Mother    Liver cancer Sister    Pancreatic cancer Sister    Stomach cancer Brother    Lung cancer Brother    Brain cancer Sister    Diabetes Father    Heart disease Father    Breast cancer Sister    Heart attack Brother    COPD Neg Hx    Stroke Neg Hx    Social History   Occupational History   Not on file  Tobacco Use   Smoking status: Some Days    Current packs/day: 0.00     Types: Cigarettes    Last attempt to quit: 11/18/2012    Years since quitting: 11.0   Smokeless tobacco: Never   Tobacco comments:    quit over 15 years ago   Vaping Use   Vaping status: Some Days  Substance and Sexual Activity   Alcohol use: No   Drug use: No   Sexual activity: Not Currently   Tobacco Counseling Ready to quit: Not Answered Counseling given: Not Answered Tobacco comments: quit over 15 years ago   SDOH Screenings   Food Insecurity: No Food Insecurity (11/25/2023)  Housing: Low Risk  (11/25/2023)  Transportation Needs: No Transportation Needs (11/25/2023)  Utilities: Not At Risk (11/25/2023)  Alcohol Screen: Low Risk  (10/05/2020)  Depression (PHQ2-9): Low Risk  (11/25/2023)  Recent Concern: Depression (PHQ2-9) - Medium Risk (10/20/2023)  Financial Resource Strain: Low Risk  (10/05/2020)  Physical Activity: Insufficiently Active (11/25/2023)  Social Connections: Moderately Isolated (11/25/2023)  Stress: No Stress Concern Present (11/25/2023)  Tobacco Use: High Risk (11/25/2023)  Health Literacy: Adequate Health Literacy (11/25/2023)   Depression Screen    11/25/2023    2:38 PM 10/20/2023    2:39 PM 08/11/2023    1:38 PM 05/12/2023    2:58 PM 04/07/2023    3:40 PM 12/17/2022    3:00 PM 11/05/2022  4:18 PM  PHQ 2/9 Scores  PHQ - 2 Score 0 4 3 0 2 2 4   PHQ- 9 Score 0 8 6 0 4 2 7      Goals Addressed   None    Visit info / Clinical Intake: Medicare Wellness Visit Type:: Subsequent Annual Wellness Visit Medicare Wellness Visit Mode:: In-person (required for WTM) Interpreter Needed?: No Pre-visit prep was completed: no AWV questionnaire completed by patient prior to visit?: no Living arrangements:: with family/others Patient's Overall Health Status Rating: very good Does pain affect daily life?: no Are you currently prescribed opioids?: no  Dietary Habits and Nutritional Risks How many meals a day?: 2 Eats fruit and vegetables daily?: yes Most meals are  obtained by: preparing own meals Diabetic:: (!) yes Any non-healing wounds?: no How often do you check your BS?: 1 Would you like to be referred to a Nutritionist or for Diabetic Management? : no  Functional Status Activities of Daily Living (to include ambulation/medication): Independent Ambulation: Independent Medication Administration: Independent Home Management: Independent Manage your own finances?: yes Primary transportation is: family/friends Concerns about vision?: no *vision screening is required for WTM* Concerns about hearing?: no  Fall Screening Falls in the past year?: 0 Number of falls in past year: 0 Was there an injury with Fall?: 0 Fall Risk Category Calculator: 0 Patient Fall Risk Level: Low Fall Risk  Fall Risk Patient at Risk for Falls Due to: No Fall Risks Fall risk Follow up: Falls evaluation completed  Home and Transportation Safety: All rugs have non-skid backing?: (!) no All stairs or steps have railings?: yes Grab bars in the bathtub or shower?: (!) no Have non-skid surface in bathtub or shower?: yes Good home lighting?: yes Regular seat belt use?: yes Hospital stays in the last year:: no  Cognitive Assessment Difficulty concentrating, remembering, or making decisions? : no Will 6CIT or Mini Cog be Completed: no 6CIT or Mini Cog Declined: patient alert, oriented, able to answer questions appropriately and recall recent events  Advance Directives (For Healthcare) Does Patient Have a Medical Advance Directive?: No Would patient like information on creating a medical advance directive?: Yes (Inpatient - patient defers creating a medical advance directive at this time - Information given)        Objective:    Today's Vitals   11/25/23 1427  BP: 125/77  Pulse: 94  Temp: 97.9 F (36.6 C)  TempSrc: Oral  SpO2: 96%  Weight: 147 lb 6.4 oz (66.9 kg)  Height: 5' 9 (1.753 m)  PainSc: 0-No pain   Body mass index is 21.77 kg/m.  Current  Medications (verified) Outpatient Encounter Medications as of 11/25/2023  Medication Sig   buPROPion  (WELLBUTRIN  XL) 300 MG 24 hr tablet Take 1 tablet (300 mg total) by mouth daily.   gabapentin  (NEURONTIN ) 100 MG capsule Take 1 capsule (100 mg total) by mouth 3 (three) times daily.   gabapentin  (NEURONTIN ) 300 MG capsule Take 1 capsule (300 mg total) by mouth at bedtime.   hydrOXYzine  (VISTARIL ) 25 MG capsule TAKE 1 CAPSULE BY MOUTH THREE TIMES DAILY AS NEEDED FOR ANXIETY   metFORMIN  (GLUCOPHAGE ) 500 MG tablet Take 2 tablets (1,000 mg total) by mouth 2 (two) times daily with a meal.   ondansetron  (ZOFRAN -ODT) 4 MG disintegrating tablet Take 1 tablet (4 mg total) by mouth every 8 (eight) hours as needed for nausea or vomiting.   pravastatin  (PRAVACHOL ) 40 MG tablet Take 1 tablet (40 mg total) by mouth daily.   rizatriptan  (MAXALT )  10 MG tablet Take 1 tablet (10 mg total) by mouth as needed for migraine. May repeat in 2 hours if needed   sertraline  (ZOLOFT ) 25 MG tablet Take 1 tablet (25 mg total) by mouth daily. Okay to increase dose to 2 tabs per day after two weeks   Suvorexant  (BELSOMRA ) 20 MG TABS Take 1 tablet (20 mg total) by mouth at bedtime as needed.   tiZANidine  (ZANAFLEX ) 4 MG tablet Take 1 tablet (4 mg total) by mouth at bedtime as needed for muscle spasms.   Blood Pressure Monitoring (BLOOD PRESSURE MONITOR AUTOMAT) DEVI 1 Device by Does not apply route in the morning and at bedtime. Check blood pressure with device twice per day per manufacturer's instructions   [DISCONTINUED] colchicine  0.6 MG tablet Take 1 tablet (0.6 mg total) by mouth daily. (Patient not taking: Reported on 10/20/2023)   [DISCONTINUED] fluticasone  (FLONASE ) 50 MCG/ACT nasal spray Place 2 sprays into both nostrils daily.   [DISCONTINUED] hyoscyamine (LEVSIN) 0.125 MG tablet Take 0.125 mg by mouth every 6 (six) hours as needed.   [DISCONTINUED] methylPREDNISolone  (MEDROL  DOSEPAK) 4 MG TBPK tablet Take as directed    [DISCONTINUED] omeprazole (PRILOSEC) 40 MG capsule Take 40 mg by mouth daily.   [DISCONTINUED] triamcinolone  cream (KENALOG ) 0.1 % Apply 1 Application topically 2 (two) times daily.   No facility-administered encounter medications on file as of 11/25/2023.   Hearing/Vision screen No results found. Immunizations and Health Maintenance Health Maintenance  Topic Date Due   DTaP/Tdap/Td (2 - Td or Tdap) 11/24/2024 (Originally 11/18/2023)   HEMOGLOBIN A1C  02/11/2024   Diabetic kidney evaluation - Urine ACR  04/06/2024   COVID-19 Vaccine (9 - 2025-26 season) 04/18/2024   OPHTHALMOLOGY EXAM  04/27/2024   Diabetic kidney evaluation - eGFR measurement  08/10/2024   FOOT EXAM  08/10/2024   Medicare Annual Wellness (AWV)  11/24/2024   Mammogram  05/04/2025   Colonoscopy  08/24/2028   Pneumococcal Vaccine: 50+ Years  Completed   Influenza Vaccine  Completed   DEXA SCAN  Completed   Hepatitis C Screening  Completed   Zoster Vaccines- Shingrix  Completed   Meningococcal B Vaccine  Aged Out        Assessment/Plan:  This is a routine wellness examination for Chanele.  Patient Care Team: Melvin Pao, NP as PCP - General Janalyn Keene NOVAK, MD (Inactive) as Consulting Physician (Gastroenterology) Nicholaus Selinda Birmingham, MD (Inactive) as Consulting Physician (General Surgery)  I have personally reviewed and noted the following in the patient's chart:   Medical and social history Use of alcohol, tobacco or illicit drugs  Current medications and supplements including opioid prescriptions. Functional ability and status Nutritional status Physical activity Advanced directives List of other physicians Hospitalizations, surgeries, and ER visits in previous 12 months Vitals Screenings to include cognitive, depression, and falls Referrals and appointments  No orders of the defined types were placed in this encounter.  In addition, I have reviewed and discussed with patient certain  preventive protocols, quality metrics, and best practice recommendations. A written personalized care plan for preventive services as well as general preventive health recommendations were provided to patient.   Laymon LOISE Metro, CMA   11/25/2023   Return in 1 year (on 11/24/2024).  After Visit Summary: (In Person-Printed) AVS printed and given to the patient

## 2023-11-25 NOTE — Assessment & Plan Note (Signed)
Chronic.  Controlled.  Continue with current medication regimen of Pravastatin 40mg.  Labs ordered today.  Return to clinic in 6 months for reevaluation.  Call sooner if concerns arise.   

## 2023-11-25 NOTE — Assessment & Plan Note (Signed)
 Chronic.  Controlled.   Continue with current medication regimen of Metformin  1000mg  BID.  Eye exam and foot exam up to date.  Labs ordered today. Will make further recommendations based on lab results.  Return to clinic in 3 months for reevaluation.  Call sooner if concerns arise.

## 2023-11-25 NOTE — Assessment & Plan Note (Signed)
 Chronic.  Controlled.  Continue with current medication regimen on Pravastatin 40mg .  Refills sent today.  Labs ordered today.  Return to clinic in 3 months for reevaluation.  Call sooner if concerns arise.

## 2023-11-26 ENCOUNTER — Telehealth: Payer: Self-pay | Admitting: Nurse Practitioner

## 2023-11-26 ENCOUNTER — Ambulatory Visit: Payer: Self-pay | Admitting: Nurse Practitioner

## 2023-11-26 LAB — HEMOGLOBIN A1C
Est. average glucose Bld gHb Est-mCnc: 117 mg/dL
Hgb A1c MFr Bld: 5.7 % — ABNORMAL HIGH (ref 4.8–5.6)

## 2023-11-26 LAB — LIPID PANEL
Chol/HDL Ratio: 4.1 ratio (ref 0.0–4.4)
Cholesterol, Total: 142 mg/dL (ref 100–199)
HDL: 35 mg/dL — ABNORMAL LOW (ref >39)
LDL Chol Calc (NIH): 57 mg/dL (ref 0–99)
Triglycerides: 324 mg/dL — ABNORMAL HIGH (ref 0–149)
VLDL Cholesterol Cal: 50 mg/dL — ABNORMAL HIGH (ref 5–40)

## 2023-11-26 LAB — COMPREHENSIVE METABOLIC PANEL WITH GFR
ALT: 24 IU/L (ref 0–32)
AST: 23 IU/L (ref 0–40)
Albumin: 4.2 g/dL (ref 3.8–4.8)
Alkaline Phosphatase: 48 IU/L — ABNORMAL LOW (ref 49–135)
BUN/Creatinine Ratio: 18 (ref 12–28)
BUN: 13 mg/dL (ref 8–27)
Bilirubin Total: 0.2 mg/dL (ref 0.0–1.2)
CO2: 26 mmol/L (ref 20–29)
Calcium: 9.5 mg/dL (ref 8.7–10.3)
Chloride: 102 mmol/L (ref 96–106)
Creatinine, Ser: 0.74 mg/dL (ref 0.57–1.00)
Globulin, Total: 1.9 g/dL (ref 1.5–4.5)
Glucose: 113 mg/dL — ABNORMAL HIGH (ref 70–99)
Potassium: 4.1 mmol/L (ref 3.5–5.2)
Sodium: 141 mmol/L (ref 134–144)
Total Protein: 6.1 g/dL (ref 6.0–8.5)
eGFR: 86 mL/min/1.73 (ref >59)

## 2023-11-26 NOTE — Telephone Encounter (Signed)
 Will contact patient once results are reviewed by the provider.

## 2023-11-26 NOTE — Telephone Encounter (Signed)
 Copied from CRM [id number]. Topic: Clinical - Lab/Test Results >> Nov 26, 2023 11:17 AM Ivette P wrote: Reason for CRM: pt wanted to know lab results from 11/25/23  Recommendation not ready from provider.    Pls follow up with pt regarding results.

## 2023-11-29 ENCOUNTER — Other Ambulatory Visit: Payer: Self-pay

## 2023-11-29 ENCOUNTER — Emergency Department

## 2023-11-29 ENCOUNTER — Emergency Department: Admission: EM | Admit: 2023-11-29 | Discharge: 2023-11-29 | Disposition: A | Discharge status: Home / Self Care

## 2023-11-29 DIAGNOSIS — S42355A Nondisplaced comminuted fracture of shaft of humerus, left arm, initial encounter for closed fracture: Secondary | ICD-10-CM | POA: Insufficient documentation

## 2023-11-29 DIAGNOSIS — W19XXXA Unspecified fall, initial encounter: Secondary | ICD-10-CM | POA: Insufficient documentation

## 2023-11-29 DIAGNOSIS — R42 Dizziness and giddiness: Secondary | ICD-10-CM | POA: Insufficient documentation

## 2023-11-29 DIAGNOSIS — I10 Essential (primary) hypertension: Secondary | ICD-10-CM | POA: Diagnosis not present

## 2023-11-29 DIAGNOSIS — R55 Syncope and collapse: Secondary | ICD-10-CM | POA: Insufficient documentation

## 2023-11-29 DIAGNOSIS — S4992XA Unspecified injury of left shoulder and upper arm, initial encounter: Secondary | ICD-10-CM | POA: Diagnosis present

## 2023-11-29 DIAGNOSIS — E119 Type 2 diabetes mellitus without complications: Secondary | ICD-10-CM | POA: Insufficient documentation

## 2023-11-29 LAB — BASIC METABOLIC PANEL WITH GFR
Anion gap: 9 (ref 5–15)
BUN: 15 mg/dL (ref 8–23)
CO2: 26 mmol/L (ref 22–32)
Calcium: 8.4 mg/dL — ABNORMAL LOW (ref 8.9–10.3)
Chloride: 104 mmol/L (ref 98–111)
Creatinine, Ser: 0.73 mg/dL (ref 0.44–1.00)
GFR, Estimated: >60 mL/min (ref >60)
Glucose, Bld: 128 mg/dL — ABNORMAL HIGH (ref 70–99)
Potassium: 3.5 mmol/L (ref 3.5–5.1)
Sodium: 139 mmol/L (ref 135–145)

## 2023-11-29 LAB — CBC WITH DIFFERENTIAL/PLATELET
Abs Immature Granulocytes: 0.02 K/uL (ref 0.00–0.07)
Basophils Absolute: 0.1 K/uL (ref 0.0–0.1)
Basophils Relative: 1 %
Eosinophils Absolute: 0.3 K/uL (ref 0.0–0.5)
Eosinophils Relative: 5 %
HCT: 36.6 % (ref 36.0–46.0)
Hemoglobin: 11.9 g/dL — ABNORMAL LOW (ref 12.0–15.0)
Immature Granulocytes: 0 %
Lymphocytes Relative: 25 %
Lymphs Abs: 1.7 K/uL (ref 0.7–4.0)
MCH: 29.4 pg (ref 26.0–34.0)
MCHC: 32.5 g/dL (ref 30.0–36.0)
MCV: 90.4 fL (ref 80.0–100.0)
Monocytes Absolute: 0.6 K/uL (ref 0.1–1.0)
Monocytes Relative: 9 %
Neutro Abs: 4 K/uL (ref 1.7–7.7)
Neutrophils Relative %: 60 %
Platelets: 272 K/uL (ref 150–400)
RBC: 4.05 MIL/uL (ref 3.87–5.11)
RDW: 12.9 % (ref 11.5–15.5)
WBC: 6.6 K/uL (ref 4.0–10.5)
nRBC: 0 % (ref 0.0–0.2)

## 2023-11-29 MED ORDER — ONDANSETRON HCL 4 MG/2ML IJ SOLN
4.0000 mg | Freq: Once | INTRAMUSCULAR | Status: AC
  Administered 2023-11-29: 4 mg via INTRAVENOUS
  Filled 2023-11-29: qty 2

## 2023-11-29 MED ORDER — MORPHINE SULFATE (PF) 4 MG/ML IV SOLN
4.0000 mg | Freq: Once | INTRAVENOUS | Status: AC
  Administered 2023-11-29: 4 mg via INTRAVENOUS
  Filled 2023-11-29: qty 1

## 2023-11-29 MED ORDER — HYDROCODONE-ACETAMINOPHEN 5-325 MG PO TABS: 1.0000 | ORAL_TABLET | Freq: Three times a day (TID) | ORAL | 0 refills | Status: AC | PRN

## 2023-11-29 NOTE — ED Triage Notes (Addendum)
 Pt had fall after getting up quickly to throw out dinner tray today, hurting left shoulder. 18 g rt AC 1 gram tylenol  500 ml's NS 10/10 pain Hx DM No LOC, no thinners 127/82 97% RA  74 HR 14 RR 84- BGL

## 2023-11-29 NOTE — ED Provider Notes (Signed)
 [facility] Emergency Department Provider Note     Event Date/Time   First MD Initiated Contact with Patient 11/29/23 1348     (approximate)   History   Fall   HPI  Marcia [name] is a 73 y.o. left handed female with a history of HTN, HLD, DM type II, osteopenia, and anxiety, presents to the ED via EMS from home.  Patient suffered a possible mechanical fall, resulting in acute left shoulder injury.  She presents to the ED via EMS, for evaluation of a witnessed fall.  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  She was sitting at the dinner table with one of the residents, just prior to the incident.  She remembers standing up to take the patient's tray to the trash can, she felt a little bit dizzy, but recalls making it to the trash can before she then recalls waking up to people standing over her attending to her. Patient's son is at bedside, and his report from one of the staffers at [facility] was that the patient had a syncopal episode at the trash can, and was unresponsive for approximately 5 minutes.  EMS reports normal vital signs and route.  Patient was given normal saline fluid bolus as well as Tylenol  and route.  She presents with pain, disability, deformity to the left proximal shoulder.  No head injury or LOC reported.    Physical Exam   Triage Vital Signs: ED Triage Vitals  Encounter Vitals Group     BP 11/29/23 1330 120/77     Girls Systolic BP Percentile --      Girls Diastolic BP Percentile --      Boys Systolic BP Percentile --      Boys Diastolic BP Percentile --      Pulse Rate 11/29/23 1330 77     Resp 11/29/23 1330 16     Temp 11/29/23 1330 97.7 F (36.5 C)     Temp Source 11/29/23 1330 Oral     SpO2 11/29/23 1326 100 %     Weight 11/29/23 1327 147 lb 4.3 oz (66.8 kg)     Height 11/29/23 1327 5' 9 (1.753 m)     Head Circumference --      Peak Flow --      Pain Score 11/29/23 1326 10      Pain Loc --      Pain Education --      Exclude from Growth Chart --     Most recent vital signs: Vitals:   11/29/23 1330 11/29/23 1330  BP: 120/77   Pulse: 77   Resp:  16  Temp:  97.7 F (36.5 C)  SpO2: 96%     General Awake, no distress. NAD A&O x 4 HEENT NCAT. PERRL. EOMI. No rhinorrhea. Mucous membranes are moist.  CV:  Good peripheral perfusion.  No CCE distally RESP:  Normal effort.  ABD:  No distention. Soft, nontender MSK:  Left shoulder with soft tissue swelling noted to the proximal deltoid region.  Patient with decreased shoulder range of motion.  Normal composite fist distally.   ED Results / Procedures / Treatments   Labs (all labs ordered are listed, but only abnormal results are displayed) Labs Reviewed  BASIC METABOLIC PANEL WITH GFR - Abnormal; Notable for the following components:      Result Value   Glucose, Bld 128 (*)    Calcium 8.4 (*)  All other components within normal limits  CBC WITH DIFFERENTIAL/PLATELET - Abnormal; Notable for the following components:   Hemoglobin 11.9 (*)    All other components within normal limits    EKG  Vent. rate 83 BPM PR interval 166 ms QRS duration 84 ms QT/QTcB 392/460 ms P-R-T axes 71 3 85 Normal sinus rhythm Normal ECG When compared with ECG of 14-Oct-2022 13:04, QT has shortened  RADIOLOGY  I personally viewed and evaluated these images as part of my medical decision making, as well as reviewing the written report by the radiologist.  ED Provider Interpretation: Comminuted left humeral fracture at the surgical neck  DG Shoulder Left Result Date: 11/29/2023 EXAM: 1 VIEW XRAY OF THE LEFT SHOULDER 11/29/2023 01:47:00 PM COMPARISON: None available. CLINICAL HISTORY: injury FINDINGS: BONES AND JOINTS: Acute comminuted impacted left humeral head fracture at the surgical neck of the humerus. Possible extension into the glenohumeral joint . AC joint osteoarthritis. SOFT TISSUES: No abnormal calcifications.  Visualized lung is unremarkable. IMPRESSION: 1. Acute comminuted impacted fracture of the surgical neck of the left humerus. Question extension into the glenohumeral joint. Electronically signed by: Norman Gatlin MD 11/29/2023 02:20 PM EST RP Workstation: [id number]    PROCEDURES:  Critical Care performed: No  Procedures   MEDICATIONS ORDERED IN ED: Medications  morphine  (PF) 4 MG/ML injection 4 mg (4 mg Intravenous Given 11/29/23 1406)  ondansetron  (ZOFRAN ) injection 4 mg (4 mg Intravenous Given 11/29/23 1406)     IMPRESSION / MDM / ASSESSMENT AND PLAN / ED COURSE  I reviewed the triage vital signs and the nursing notes.                              Differential diagnosis includes, but is not limited to, syncope, mechanical fall, shoulder fracture, shoulder dislocation, bursitis  Patient's presentation is most consistent with acute complicated illness / injury requiring diagnostic workup.  Clinical Course as of 11/29/23 1533  Sat Nov 29, 2023  1445 Comminuted impacted left humeral neck fracture confirmed on x-ray images interpreted by me.  [JM]  1454 Patient stable at this time endorsing decreased pain after IV pain medication administration.  No other injury reported.  We discussed options for admission for ongoing pain control, but patient reports she feels stable and able to manage at home. [JM]  1454 Secure chat to Dr. Lorelle regarding the case.  His colleagues will follow the patient in the outpatient setting. [JM]    Clinical Course User Index [JM] Dhana Totton, Candida LULLA Kings, PA-C    Patient's diagnosis is consistent with mechanical fall resulting in acute left shoulder fracture.  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  Patient had a witnessed fall, resulting in acute pain disability to the left shoulder.  X-ray images interpreted by me, confirmed the fracture morphology of a comminuted impacted humeral head  fracture.  Patient without any complaints of any other injury.  Labs overall reassuring at this time with no signs of critical anemia or leukocytosis.  EKG without evidence of malignant arrhythmia.  We discussed options for admission for pain control and/or mobility issues.  Patient is reporting that she feels confident she can manage her self at home she lives with her adult son, and has no concerns in the short-term for ADLs with her dominant arm injury.  Patient will be discharged home with prescriptions for hydrocodone . Patient is to  follow up with [facility] as suggested, as needed or otherwise directed. Patient is given ED precautions to return to the ED for any worsening or new symptoms.  Clinical Course as of 11/29/23 1533  Sat Nov 29, 2023  1445 Comminuted impacted left humeral neck fracture confirmed on x-ray images interpreted by me.  [JM]  1454 Patient stable at this time endorsing decreased pain after IV pain medication administration.  No other injury reported.  We discussed options for admission for ongoing pain control, but patient reports she feels stable and able to manage at home. [JM]  1454 Secure chat to Dr. Lorelle regarding the case.  His colleagues will follow the patient in the outpatient setting. [JM]    Clinical Course User Index [JM] Mardene Lessig, Candida LULLA Kings, PA-C    FINAL CLINICAL IMPRESSION(S) / ED DIAGNOSES   Final diagnoses:  Fall, initial encounter  Closed nondisplaced comminuted fracture of shaft of left humerus, initial encounter     Rx / DC Orders   ED Discharge Orders          Ordered    HYDROcodone -acetaminophen  (NORCO/VICODIN) 5-325 MG tablet  3 times daily PRN        11/29/23 1500             Note:  This document was prepared using Dragon voice recognition software and may include unintentional dictation errors.    Loyd Candida LULLA Kings, PA-C 11/29/23 1623    Nicholaus Rolland BRAVO, MD 11/29/23 [phone]

## 2023-11-29 NOTE — Discharge Instructions (Signed)
 Your x-ray confirms a nondisplaced but complex fracture to your upper arm.  You be placed in a sling for support.  You should apply ice to help reduce swelling and pain.  Take the prescription pain medicines as necessary.  Wear the sling except when bathing or changing close.  You should follow-up with [facility] orthopedics next week for further fracture care.

## 2023-11-29 NOTE — ED Notes (Signed)
 EMS sling currently in place on patient left arm

## 2023-12-08 ENCOUNTER — Other Ambulatory Visit: Payer: Self-pay | Admitting: Orthopedic Surgery

## 2023-12-08 DIAGNOSIS — S42201A Unspecified fracture of upper end of right humerus, initial encounter for closed fracture: Secondary | ICD-10-CM

## 2023-12-09 ENCOUNTER — Ambulatory Visit
Admission: RE | Admit: 2023-12-09 | Discharge: 2023-12-09 | Disposition: A | Discharge status: Home / Self Care | Source: Ambulatory Visit | Attending: Orthopedic Surgery | Admitting: Orthopedic Surgery

## 2023-12-09 DIAGNOSIS — S42201A Unspecified fracture of upper end of right humerus, initial encounter for closed fracture: Secondary | ICD-10-CM | POA: Insufficient documentation

## 2023-12-09 DIAGNOSIS — S42202A Unspecified fracture of upper end of left humerus, initial encounter for closed fracture: Secondary | ICD-10-CM | POA: Insufficient documentation

## 2023-12-10 ENCOUNTER — Encounter: Payer: Self-pay | Admitting: Orthopedic Surgery

## 2023-12-10 DIAGNOSIS — S42202A Unspecified fracture of upper end of left humerus, initial encounter for closed fracture: Secondary | ICD-10-CM

## 2023-12-29 ENCOUNTER — Other Ambulatory Visit: Payer: Self-pay | Admitting: Nurse Practitioner

## 2023-12-30 NOTE — Telephone Encounter (Signed)
 Requested medication (s) are due for refill today:Yes  Requested medication (s) are on the active medication list:Yes  Last refill:  09/29/2023  Future visit if scheduled: 03/01/2024  Requested Renewals   Name from pharmacy: METFORMIN  HCL 500 MG TABLET       Will file in chart as: metFORMIN  (GLUCOPHAGE ) 500 MG tablet   Sig: Take 2 tablets (1,000 mg total) by mouth 2 (two) times daily with a meal.   Disp: 360 tablet    Refills: 0   Start: 12/29/2023   Class: Normal   Non-formulary   Last ordered: 7 months ago (05/26/2023) by Marcia Petty, NP   Last refill: 09/29/2023   Rx #: [id number]   Endocrinology:  Diabetes - Biguanides Failed12/08/2023 08:59 AM  Protocol Details B12 Level in normal range and within 720 days   Cr in normal range and within 360 days   HBA1C is between 0 and 7.9 and within 180 days   eGFR in normal range and within 360 days   Valid encounter within last 6 months   CBC within normal limits and completed in the last 12 months    To be filled at: [facility] [place], [place] - [address]

## 2024-01-26 ENCOUNTER — Ambulatory Visit: Payer: Self-pay

## 2024-01-26 NOTE — Telephone Encounter (Signed)
 Patient will need an appointment before medication can be sent in. Please call and schedule appointment.

## 2024-01-26 NOTE — Telephone Encounter (Signed)
 FYI Only or Action Required?: Action required by provider: clinical question for provider.  Patient was last seen in primary care on 11/25/2023 by Melvin Pao, NP.  Called Nurse Triage reporting Sinusitis.  Symptoms began several days ago.  Interventions attempted: Nothing.  Symptoms are: unchanged.  Triage Disposition: Home Care  Patient/caregiver understands and will follow disposition?: No, wishes to speak with PCP  Copied from CRM #[id number]. Topic: Clinical - Red Word Triage >> Jan 26, 2024 11:43 AM Antwanette L wrote: Red Word that prompted transfer to Nurse Triage: The patient is requesting medication for a possible sinus infection. The patient reports no fever but has a stuffy nose, difficulty breathing , and head congestion Reason for Disposition  [1] Sinus congestion as part of a cold AND [2] present < 10 days  Answer Assessment - Initial Assessment Questions 1. LOCATION: Where does it hurt?      Nose  2. ONSET: When did the sinus pain start?  (e.g., hours, days)      3-4 days 3. SEVERITY: How bad is the pain?   (Scale 0-10; or none, mild, moderate or severe)     mild 4. RECURRENT SYMPTOM: Have you ever had sinus problems before? If Yes, ask: When was the last time? and What happened that time?      Sinus infection hx, states feels the same 5. NASAL CONGESTION: Is the nose blocked? If Yes, ask: Can you open it or must you breathe through your mouth?     Yes congestion 6. NASAL DISCHARGE: Do you have discharge from your nose? If so ask, What color?     clear 7. FEVER: Do you have a fever? If Yes, ask: What is it, how was it measured, and when did it start?      denies 8. OTHER SYMPTOMS: Do you have any other symptoms? (e.g., sore throat, cough, earache, difficulty breathing)     Denies SOB when breathing thru mouth, denies cough, denies sore throat  Pt states she would like rx sent in, denies appt at this time. Please call back when  decision made.  Protocols used: Sinus Pain or Congestion-A-AH

## 2024-01-27 NOTE — Telephone Encounter (Signed)
 Called patient and left a message to call back to get scheduled.

## 2024-01-27 NOTE — Telephone Encounter (Signed)
 Copied from CRM [id number]. Topic: General - Other >> Jan 27, 2024  8:37 AM Marcia [name] wrote: Reason for CRM: Pt was triaged yesterday due to stuffy nose, difficulty breathing and head congestion. Pt requested for medication to be sent in for her but that was denied due to provider requesting for pt to be seen before meds can be sent in. Offered to warm transfer to nurse triage for scheduling but pt denied stating that she feels better now. Im sending message to make sure NT is informed of this.

## 2024-02-02 ENCOUNTER — Other Ambulatory Visit: Payer: Self-pay | Admitting: Nurse Practitioner

## 2024-02-02 DIAGNOSIS — Z1231 Encounter for screening mammogram for malignant neoplasm of breast: Secondary | ICD-10-CM

## 2024-02-20 ENCOUNTER — Other Ambulatory Visit: Payer: Self-pay | Admitting: Nurse Practitioner

## 2024-02-20 DIAGNOSIS — G4700 Insomnia, unspecified: Secondary | ICD-10-CM

## 2024-02-23 NOTE — Telephone Encounter (Signed)
 Requested Prescriptions  Pending Prescriptions Disp Refills   hydrOXYzine  (VISTARIL ) 25 MG capsule [Pharmacy Med Name: HYDROXYZINE  PAM 25 MG CAP] 270 capsule 0    Sig: TAKE 1 CAPSULE BY MOUTH THREE TIMES DAILY AS NEEDED FOR ANXIETY     Ear, Nose, and Throat:  Antihistamines 2 Passed - 02/23/2024  8:38 AM      Passed - Cr in normal range and within 360 days    Creatinine, Ser  Date Value Ref Range Status  11/29/2023 0.73 0.44 - 1.00 mg/dL Final         Passed - Valid encounter within last 12 months    Recent Outpatient Visits           3 months ago Encounter for [employer] annual wellness exam   [place] [facility] Melvin Pao, NP   4 months ago Immunization due   [facility] [place], Pao, NP   6 months ago 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[facility])   [place] [facility] Melvin Pao, NP   9 months ago Moderate episode of recurrent major depressive disorder [facility])   [place] [facility] Melvin Pao, NP   10 months ago Annual physical exam   [place] [facility] Melvin Pao, NP               BELSOMRA  20 MG TABS [Pharmacy Med Name: BELSOMRA  20 MG TABLET] 30 tablet 0    Sig: Take 1 tablet (20 mg total) by mouth at bedtime as needed.     Off-Protocol Failed - 02/23/2024  8:38 AM      Failed - Medication not assigned to a protocol, review manually.      Passed - Valid encounter within last 12 months    Recent Outpatient Visits           3 months ago Encounter for Medicare annual wellness exam   [place] [facility] Melvin Pao, NP   4 months ago Immunization due   [facility] [place], Pao, NP   6 months ago 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[facility])   [facility] Melvin Pao, NP   9 months ago Moderate episode of recurrent major  depressive disorder [facility])   [place] [facility] Melvin Pao, NP   10 months ago Annual physical exam    [facility] Melvin Pao, NP

## 2024-03-01 ENCOUNTER — Ambulatory Visit: Admitting: Nurse Practitioner
